# Patient Record
Sex: Female | Born: 1955 | Race: White | Hispanic: No | Smoking: Never smoker
Health system: Southern US, Community
[De-identification: ages and names within clinical notes are randomized; demographics above are authoritative.]

## PROBLEM LIST (undated history)

## (undated) DIAGNOSIS — F32A Depression, unspecified: Secondary | ICD-10-CM

## (undated) DIAGNOSIS — K589 Irritable bowel syndrome without diarrhea: Secondary | ICD-10-CM

## (undated) DIAGNOSIS — I10 Essential (primary) hypertension: Secondary | ICD-10-CM

## (undated) DIAGNOSIS — J40 Bronchitis, not specified as acute or chronic: Secondary | ICD-10-CM

## (undated) DIAGNOSIS — K219 Gastro-esophageal reflux disease without esophagitis: Secondary | ICD-10-CM

## (undated) DIAGNOSIS — G8929 Other chronic pain: Secondary | ICD-10-CM

## (undated) DIAGNOSIS — R351 Nocturia: Secondary | ICD-10-CM

## (undated) DIAGNOSIS — R112 Nausea with vomiting, unspecified: Secondary | ICD-10-CM

## (undated) DIAGNOSIS — Z9889 Other specified postprocedural states: Secondary | ICD-10-CM

## (undated) DIAGNOSIS — U071 COVID-19: Secondary | ICD-10-CM

## (undated) DIAGNOSIS — J45909 Unspecified asthma, uncomplicated: Secondary | ICD-10-CM

## (undated) DIAGNOSIS — M549 Dorsalgia, unspecified: Secondary | ICD-10-CM

## (undated) DIAGNOSIS — E039 Hypothyroidism, unspecified: Secondary | ICD-10-CM

## (undated) DIAGNOSIS — M1990 Unspecified osteoarthritis, unspecified site: Secondary | ICD-10-CM

## (undated) DIAGNOSIS — Z8719 Personal history of other diseases of the digestive system: Secondary | ICD-10-CM

## (undated) DIAGNOSIS — J189 Pneumonia, unspecified organism: Secondary | ICD-10-CM

## (undated) DIAGNOSIS — Z860100 Personal history of colon polyps, unspecified: Secondary | ICD-10-CM

## (undated) DIAGNOSIS — F419 Anxiety disorder, unspecified: Secondary | ICD-10-CM

## (undated) DIAGNOSIS — F329 Major depressive disorder, single episode, unspecified: Secondary | ICD-10-CM

## (undated) HISTORY — PX: CHOLECYSTECTOMY: SHX55

## (undated) HISTORY — DX: Bronchitis, not specified as acute or chronic: J40

## (undated) HISTORY — PX: OTHER SURGICAL HISTORY: SHX169

## (undated) HISTORY — DX: COVID-19: U07.1

## (undated) HISTORY — PX: ESOPHAGOGASTRODUODENOSCOPY: SHX1529

## (undated) HISTORY — PX: COLONOSCOPY: SHX174

## (undated) HISTORY — PX: BREAST BIOPSY: SHX20

## (undated) HISTORY — PX: ELBOW SURGERY: SHX618

## (undated) HISTORY — PX: DILATION AND CURETTAGE OF UTERUS: SHX78

## (undated) HISTORY — DX: Unspecified asthma, uncomplicated: J45.909

## (undated) HISTORY — PX: BREAST SURGERY: SHX581

---

## 2007-10-01 HISTORY — PX: COLONOSCOPY: SHX174

## 2008-06-20 ENCOUNTER — Ambulatory Visit: Payer: Self-pay | Admitting: Cardiology

## 2009-09-15 ENCOUNTER — Emergency Department (HOSPITAL_COMMUNITY): Admission: EM | Admit: 2009-09-15 | Discharge: 2009-09-15 | Payer: Self-pay | Admitting: Emergency Medicine

## 2011-06-12 ENCOUNTER — Other Ambulatory Visit: Payer: Self-pay | Admitting: Neurosurgery

## 2011-06-12 DIAGNOSIS — M47816 Spondylosis without myelopathy or radiculopathy, lumbar region: Secondary | ICD-10-CM

## 2011-06-21 ENCOUNTER — Ambulatory Visit
Admission: RE | Admit: 2011-06-21 | Discharge: 2011-06-21 | Disposition: A | Discharge status: Home / Self Care | Payer: BC Managed Care – PPO | Source: Ambulatory Visit | Attending: Neurosurgery | Admitting: Neurosurgery

## 2011-06-21 ENCOUNTER — Other Ambulatory Visit: Payer: Self-pay | Admitting: Neurosurgery

## 2011-06-21 DIAGNOSIS — M47816 Spondylosis without myelopathy or radiculopathy, lumbar region: Secondary | ICD-10-CM

## 2011-06-21 MED ORDER — IOHEXOL 180 MG/ML  SOLN
1.0000 mL | Freq: Once | INTRAMUSCULAR | Status: AC | PRN
  Administered 2011-06-21: 1 mL via INTRA_ARTICULAR

## 2011-06-21 MED ORDER — METHYLPREDNISOLONE ACETATE 40 MG/ML INJ SUSP (RADIOLOG
120.0000 mg | Freq: Once | INTRAMUSCULAR | Status: AC
  Administered 2011-06-21: 120 mg via INTRA_ARTICULAR

## 2011-07-16 ENCOUNTER — Other Ambulatory Visit: Payer: Self-pay | Admitting: Neurosurgery

## 2011-07-16 DIAGNOSIS — M47816 Spondylosis without myelopathy or radiculopathy, lumbar region: Secondary | ICD-10-CM

## 2011-08-27 ENCOUNTER — Other Ambulatory Visit: Payer: Self-pay | Admitting: Neurosurgery

## 2011-08-27 DIAGNOSIS — M79651 Pain in right thigh: Secondary | ICD-10-CM

## 2011-08-27 DIAGNOSIS — M549 Dorsalgia, unspecified: Secondary | ICD-10-CM

## 2011-09-05 ENCOUNTER — Ambulatory Visit
Admission: RE | Admit: 2011-09-05 | Discharge: 2011-09-05 | Disposition: A | Discharge status: Home / Self Care | Payer: BC Managed Care – PPO | Source: Ambulatory Visit | Attending: Neurosurgery | Admitting: Neurosurgery

## 2011-09-05 DIAGNOSIS — M79651 Pain in right thigh: Secondary | ICD-10-CM

## 2011-09-05 DIAGNOSIS — M549 Dorsalgia, unspecified: Secondary | ICD-10-CM

## 2011-09-05 MED ORDER — DIAZEPAM 5 MG PO TABS
10.0000 mg | ORAL_TABLET | Freq: Once | ORAL | Status: AC
  Administered 2011-09-05: 10 mg via ORAL

## 2011-09-05 MED ORDER — IOHEXOL 180 MG/ML  SOLN
15.0000 mL | Freq: Once | INTRAMUSCULAR | Status: AC | PRN
  Administered 2011-09-05: 15 mL via INTRATHECAL

## 2011-09-05 NOTE — Progress Notes (Addendum)
Explained discharge instructions to pt and husband, consent signed and valium given. Pt has been of citalopram and tramadol for the last 2 days.

## 2011-09-05 NOTE — Patient Instructions (Signed)
Myelogram  Discharge Instructions  1. Go home and rest quietly for the next 24 hours.  It is important to lie flat for the next 24 hours.  Get up only to go to the restroom.  You may lie in the bed or on a couch on your back, your stomach, your left side or your right side.  You may have one pillow under your head.  You may have pillows between your knees while you are on your side or under your knees while you are on your back.  2. DO NOT drive today.  Recline the seat as far back as it will go, while still wearing your seat belt, on the way home.  3. You may get up to go to the bathroom as needed.  You may sit up for 10 minutes to eat.  You may resume your normal diet and medications unless otherwise indicated.  4. The incidence of headache, nausea, or vomiting is about 5% (one in 20 patients).  If you develop a headache, lie flat and drink plenty of fluids until the headache goes away.  Caffeinated beverages may be helpful.  If you develop severe nausea and vomiting or a headache that does not go away with flat bed rest, call [phone].  5. You may resume normal activities after your 24 hours of bed rest is over; however, do not exert yourself strongly or do any heavy lifting tomorrow.  6. Call your physician for a follow-up appointment.  The results of your myelogram will be sent directly to your physician by the following day.  7. If you have any questions or if complications develop after you arrive home, please call [phone].  Discharge instructions have been explained to the patient.  The patient, or the person responsible for the patient, fully understands these instructions.   Resume citalopram and tramadol on 09/06/11, after 1:00 pm.

## 2011-10-25 ENCOUNTER — Other Ambulatory Visit: Payer: Self-pay | Admitting: Neurosurgery

## 2011-11-13 ENCOUNTER — Ambulatory Visit (HOSPITAL_COMMUNITY)
Admission: RE | Admit: 2011-11-13 | Discharge: 2011-11-13 | Disposition: A | Discharge status: Home / Self Care | Payer: BC Managed Care – PPO | Source: Ambulatory Visit | Attending: Anesthesiology | Admitting: Anesthesiology

## 2011-11-13 ENCOUNTER — Encounter (HOSPITAL_COMMUNITY): Payer: Self-pay | Admitting: Pharmacy Technician

## 2011-11-13 ENCOUNTER — Encounter (HOSPITAL_COMMUNITY): Payer: Self-pay

## 2011-11-13 ENCOUNTER — Other Ambulatory Visit: Payer: Self-pay

## 2011-11-13 ENCOUNTER — Encounter (HOSPITAL_COMMUNITY)
Admission: RE | Admit: 2011-11-13 | Discharge: 2011-11-13 | Disposition: A | Discharge status: Home / Self Care | Payer: BC Managed Care – PPO | Source: Ambulatory Visit | Attending: Neurosurgery | Admitting: Neurosurgery

## 2011-11-13 DIAGNOSIS — Z01818 Encounter for other preprocedural examination: Secondary | ICD-10-CM | POA: Insufficient documentation

## 2011-11-13 DIAGNOSIS — Z01810 Encounter for preprocedural cardiovascular examination: Secondary | ICD-10-CM | POA: Insufficient documentation

## 2011-11-13 DIAGNOSIS — Z01812 Encounter for preprocedural laboratory examination: Secondary | ICD-10-CM | POA: Insufficient documentation

## 2011-11-13 HISTORY — DX: Personal history of colonic polyps: Z86.010

## 2011-11-13 HISTORY — DX: Other chronic pain: G89.29

## 2011-11-13 HISTORY — DX: Gastro-esophageal reflux disease without esophagitis: K21.9

## 2011-11-13 HISTORY — DX: Essential (primary) hypertension: I10

## 2011-11-13 HISTORY — DX: Nocturia: R35.1

## 2011-11-13 HISTORY — DX: Hypothyroidism, unspecified: E03.9

## 2011-11-13 HISTORY — DX: Irritable bowel syndrome, unspecified: K58.9

## 2011-11-13 HISTORY — DX: Dorsalgia, unspecified: M54.9

## 2011-11-13 HISTORY — DX: Anxiety disorder, unspecified: F41.9

## 2011-11-13 HISTORY — DX: Personal history of colon polyps, unspecified: Z86.0100

## 2011-11-13 HISTORY — DX: Other specified postprocedural states: Z98.890

## 2011-11-13 HISTORY — DX: Unspecified osteoarthritis, unspecified site: M19.90

## 2011-11-13 HISTORY — DX: Major depressive disorder, single episode, unspecified: F32.9

## 2011-11-13 HISTORY — DX: Other specified postprocedural states: R11.2

## 2011-11-13 HISTORY — DX: Pneumonia, unspecified organism: J18.9

## 2011-11-13 HISTORY — DX: Personal history of other diseases of the digestive system: Z87.19

## 2011-11-13 HISTORY — DX: Depression, unspecified: F32.A

## 2011-11-13 LAB — BASIC METABOLIC PANEL
BUN: 9 mg/dL (ref 6–23)
CO2: 31 mEq/L (ref 19–32)
Calcium: 9.4 mg/dL (ref 8.4–10.5)
Chloride: 103 mEq/L (ref 96–112)
Creatinine, Ser: 0.83 mg/dL (ref 0.50–1.10)
GFR calc Af Amer: 90 mL/min — ABNORMAL LOW (ref >90)
GFR calc non Af Amer: 77 mL/min — ABNORMAL LOW (ref >90)
Glucose, Bld: 94 mg/dL (ref 70–99)
Potassium: 3.4 mEq/L — ABNORMAL LOW (ref 3.5–5.1)
Sodium: 141 mEq/L (ref 135–145)

## 2011-11-13 LAB — CBC
HCT: 35.6 % — ABNORMAL LOW (ref 36.0–46.0)
Hemoglobin: 11.8 g/dL — ABNORMAL LOW (ref 12.0–15.0)
MCH: 29.9 pg (ref 26.0–34.0)
MCHC: 33.1 g/dL (ref 30.0–36.0)
MCV: 90.1 fL (ref 78.0–100.0)
Platelets: 206 10*3/uL (ref 150–400)
RBC: 3.95 MIL/uL (ref 3.87–5.11)
RDW: 13.6 % (ref 11.5–15.5)
WBC: 5.5 10*3/uL (ref 4.0–10.5)

## 2011-11-13 LAB — SURGICAL PCR SCREEN
MRSA, PCR: NEGATIVE
Staphylococcus aureus: NEGATIVE

## 2011-11-13 NOTE — Progress Notes (Signed)
Pt doesn't have a cardiologist;pt is maintained by PA Roma Kayser  Stress test done about 57yrs ago at [facility]-to request report  Never had an echo or heart cath

## 2011-11-13 NOTE — Pre-Procedure Instructions (Signed)
20 Sherry Wong  11/13/2011   Your procedure is scheduled on:  Wed, Feb 20 @ 1205  Report to [facility] at 0900 AM.  Call this number if you have problems the morning of surgery: [phone]   Remember:   Do not eat food:After Midnight.  May have clear liquids: up to 4 Hours before arrival.(until 5:00 am)  Clear liquids include soda, tea, black coffee, apple or grape juice, broth.  Take these medicines the morning of surgery with A SIP OF WATER:    Do not wear jewelry, make-up or nail polish.  Do not wear lotions, powders, or perfumes. You may wear deodorant.  Do not shave 48 hours prior to surgery.  Do not bring valuables to the hospital.  Contacts, dentures or bridgework may not be worn into surgery.  Leave suitcase in the car. After surgery it may be brought to your room.  For patients admitted to the hospital, checkout time is 11:00 AM the day of discharge.   Patients discharged the day of surgery will not be allowed to drive home.  Name and phone number of your driver:    Special Instructions: CHG Shower Use Special Wash: 1/2 bottle night before surgery and 1/2 bottle morning of surgery.   Please read over the following fact sheets that you were given: Pain Booklet, Coughing and Deep Breathing, MRSA Information and Surgical Site Infection Prevention

## 2011-11-14 NOTE — Progress Notes (Signed)
nuc stress test placed on this chart.

## 2011-11-19 MED ORDER — CEFAZOLIN SODIUM-DEXTROSE 2-3 GM-% IV SOLR: 2.0000 g | INTRAVENOUS | Status: DC

## 2011-11-20 ENCOUNTER — Encounter (HOSPITAL_COMMUNITY): Admission: RE | Payer: Self-pay | Source: Ambulatory Visit

## 2011-11-20 ENCOUNTER — Inpatient Hospital Stay (HOSPITAL_COMMUNITY): Admission: RE | Admit: 2011-11-20 | Payer: BC Managed Care – PPO | Source: Ambulatory Visit | Admitting: Neurosurgery

## 2011-11-20 SURGERY — LUMBAR LAMINECTOMY/DECOMPRESSION MICRODISCECTOMY
Anesthesia: General | Laterality: Right

## 2011-11-22 ENCOUNTER — Other Ambulatory Visit: Payer: Self-pay | Admitting: Neurosurgery

## 2011-12-02 MED ORDER — CEFAZOLIN SODIUM-DEXTROSE 2-3 GM-% IV SOLR
2.0000 g | INTRAVENOUS | Status: AC
  Administered 2011-12-03: 2 g via INTRAVENOUS
  Filled 2011-12-02: qty 50

## 2011-12-03 ENCOUNTER — Encounter (HOSPITAL_COMMUNITY): Payer: Self-pay | Admitting: Anesthesiology

## 2011-12-03 ENCOUNTER — Ambulatory Visit (HOSPITAL_COMMUNITY): Payer: BC Managed Care – PPO

## 2011-12-03 ENCOUNTER — Encounter (HOSPITAL_COMMUNITY)
Admission: RE | Disposition: A | Discharge status: Home / Self Care | Payer: Self-pay | Source: Ambulatory Visit | Attending: Neurosurgery

## 2011-12-03 ENCOUNTER — Ambulatory Visit (HOSPITAL_COMMUNITY): Payer: BC Managed Care – PPO | Admitting: Anesthesiology

## 2011-12-03 ENCOUNTER — Encounter (HOSPITAL_COMMUNITY): Payer: Self-pay | Admitting: Surgery

## 2011-12-03 ENCOUNTER — Inpatient Hospital Stay (HOSPITAL_COMMUNITY)
Admission: RE | Admit: 2011-12-03 | Discharge: 2011-12-03 | DRG: 758 | Disposition: A | Discharge status: Home / Self Care | Payer: BC Managed Care – PPO | Source: Ambulatory Visit | Attending: Neurosurgery | Admitting: Neurosurgery

## 2011-12-03 DIAGNOSIS — M109 Gout, unspecified: Secondary | ICD-10-CM | POA: Diagnosis present

## 2011-12-03 DIAGNOSIS — Z8701 Personal history of pneumonia (recurrent): Secondary | ICD-10-CM

## 2011-12-03 DIAGNOSIS — I10 Essential (primary) hypertension: Secondary | ICD-10-CM | POA: Diagnosis present

## 2011-12-03 DIAGNOSIS — M48061 Spinal stenosis, lumbar region without neurogenic claudication: Principal | ICD-10-CM | POA: Diagnosis present

## 2011-12-03 DIAGNOSIS — Z886 Allergy status to analgesic agent status: Secondary | ICD-10-CM

## 2011-12-03 DIAGNOSIS — F411 Generalized anxiety disorder: Secondary | ICD-10-CM | POA: Diagnosis present

## 2011-12-03 DIAGNOSIS — K219 Gastro-esophageal reflux disease without esophagitis: Secondary | ICD-10-CM | POA: Diagnosis present

## 2011-12-03 DIAGNOSIS — F329 Major depressive disorder, single episode, unspecified: Secondary | ICD-10-CM | POA: Diagnosis present

## 2011-12-03 DIAGNOSIS — F3289 Other specified depressive episodes: Secondary | ICD-10-CM | POA: Diagnosis present

## 2011-12-03 DIAGNOSIS — E039 Hypothyroidism, unspecified: Secondary | ICD-10-CM | POA: Diagnosis present

## 2011-12-03 HISTORY — PX: LUMBAR LAMINECTOMY/DECOMPRESSION MICRODISCECTOMY: SHX5026

## 2011-12-03 LAB — BASIC METABOLIC PANEL
BUN: 10 mg/dL (ref 6–23)
CO2: 30 mEq/L (ref 19–32)
Calcium: 9.8 mg/dL (ref 8.4–10.5)
Chloride: 102 mEq/L (ref 96–112)
Creatinine, Ser: 0.81 mg/dL (ref 0.50–1.10)
GFR calc Af Amer: >90 mL/min (ref >90)
GFR calc non Af Amer: 80 mL/min — ABNORMAL LOW (ref >90)
Glucose, Bld: 95 mg/dL (ref 70–99)
Potassium: 3.8 mEq/L (ref 3.5–5.1)
Sodium: 140 mEq/L (ref 135–145)

## 2011-12-03 LAB — CBC
HCT: 40.2 % (ref 36.0–46.0)
Hemoglobin: 13.5 g/dL (ref 12.0–15.0)
MCH: 30.5 pg (ref 26.0–34.0)
MCHC: 33.6 g/dL (ref 30.0–36.0)
MCV: 90.7 fL (ref 78.0–100.0)
Platelets: 221 10*3/uL (ref 150–400)
RBC: 4.43 MIL/uL (ref 3.87–5.11)
RDW: 14.3 % (ref 11.5–15.5)
WBC: 6.9 10*3/uL (ref 4.0–10.5)

## 2011-12-03 SURGERY — LUMBAR LAMINECTOMY/DECOMPRESSION MICRODISCECTOMY 2 LEVELS
Anesthesia: General | Laterality: Right | Wound class: Clean

## 2011-12-03 MED ORDER — FENTANYL CITRATE 0.05 MG/ML IJ SOLN
INTRAMUSCULAR | Status: DC | PRN
  Administered 2011-12-03: 100 ug via INTRAVENOUS
  Administered 2011-12-03: 150 ug via INTRAVENOUS

## 2011-12-03 MED ORDER — LOSARTAN POTASSIUM-HCTZ 100-25 MG PO TABS: 1.0000 | ORAL_TABLET | Freq: Every day | ORAL | Status: DC

## 2011-12-03 MED ORDER — MORPHINE SULFATE 4 MG/ML IJ SOLN
4.0000 mg | INTRAMUSCULAR | Status: DC | PRN
  Administered 2011-12-03: 4 mg via INTRAVENOUS
  Filled 2011-12-03: qty 1

## 2011-12-03 MED ORDER — ACETAMINOPHEN 650 MG RE SUPP: 650.0000 mg | RECTAL | Status: DC | PRN

## 2011-12-03 MED ORDER — LOSARTAN POTASSIUM 50 MG PO TABS
100.0000 mg | ORAL_TABLET | Freq: Every day | ORAL | Status: DC
Start: 2011-12-03 — End: 2011-12-03
  Administered 2011-12-03: 100 mg via ORAL
  Filled 2011-12-03: qty 2

## 2011-12-03 MED ORDER — CEFAZOLIN SODIUM 1-5 GM-% IV SOLN
1.0000 g | Freq: Three times a day (TID) | INTRAVENOUS | Status: DC
  Administered 2011-12-03: 1 g via INTRAVENOUS
  Filled 2011-12-03 (×2): qty 50

## 2011-12-03 MED ORDER — GLYCOPYRROLATE 0.2 MG/ML IJ SOLN
INTRAMUSCULAR | Status: DC | PRN
  Administered 2011-12-03: .7 mg via INTRAVENOUS

## 2011-12-03 MED ORDER — BUPIVACAINE LIPOSOME 1.3 % IJ SUSP
20.0000 mL | INTRAMUSCULAR | Status: DC
  Filled 2011-12-03: qty 20

## 2011-12-03 MED ORDER — BUPIVACAINE LIPOSOME 1.3 % IJ SUSP
INTRAMUSCULAR | Status: DC | PRN
  Administered 2011-12-03: 20 mL

## 2011-12-03 MED ORDER — PHENOL 1.4 % MT LIQD: 1.0000 | OROMUCOSAL | Status: DC | PRN

## 2011-12-03 MED ORDER — ACETAMINOPHEN 325 MG PO TABS: 650.0000 mg | ORAL_TABLET | ORAL | Status: DC | PRN

## 2011-12-03 MED ORDER — DOCUSATE SODIUM 100 MG PO CAPS: 100.0000 mg | ORAL_CAPSULE | Freq: Two times a day (BID) | ORAL | Status: DC

## 2011-12-03 MED ORDER — SODIUM CHLORIDE 0.9 % IJ SOLN: 3.0000 mL | Freq: Two times a day (BID) | INTRAMUSCULAR | Status: DC

## 2011-12-03 MED ORDER — HYDROCHLOROTHIAZIDE 25 MG PO TABS
25.0000 mg | ORAL_TABLET | Freq: Every day | ORAL | Status: DC
Start: 2011-12-03 — End: 2011-12-03
  Administered 2011-12-03: 25 mg via ORAL
  Filled 2011-12-03: qty 1

## 2011-12-03 MED ORDER — THROMBIN 5000 UNITS EX SOLR
CUTANEOUS | Status: DC | PRN
  Administered 2011-12-03 (×2): 5000 [IU] via TOPICAL

## 2011-12-03 MED ORDER — CIPROFLOXACIN HCL 500 MG PO TABS
500.0000 mg | ORAL_TABLET | Freq: Two times a day (BID) | ORAL | Status: DC
  Administered 2011-12-03: 500 mg via ORAL
  Filled 2011-12-03 (×2): qty 1

## 2011-12-03 MED ORDER — NEOSTIGMINE METHYLSULFATE 1 MG/ML IJ SOLN
INTRAMUSCULAR | Status: DC | PRN
  Administered 2011-12-03: 4 mg via INTRAVENOUS

## 2011-12-03 MED ORDER — ONDANSETRON HCL 4 MG/2ML IJ SOLN
INTRAMUSCULAR | Status: DC | PRN
  Administered 2011-12-03: 4 mg via INTRAVENOUS

## 2011-12-03 MED ORDER — MIDAZOLAM HCL 5 MG/5ML IJ SOLN
INTRAMUSCULAR | Status: DC | PRN
  Administered 2011-12-03: 2 mg via INTRAVENOUS

## 2011-12-03 MED ORDER — METRONIDAZOLE 500 MG PO TABS
500.0000 mg | ORAL_TABLET | Freq: Four times a day (QID) | ORAL | Status: DC
  Filled 2011-12-03 (×3): qty 1

## 2011-12-03 MED ORDER — 0.9 % SODIUM CHLORIDE (POUR BTL) OPTIME
TOPICAL | Status: DC | PRN
  Administered 2011-12-03: 1000 mL

## 2011-12-03 MED ORDER — VECURONIUM BROMIDE 10 MG IV SOLR
INTRAVENOUS | Status: DC | PRN
  Administered 2011-12-03: 6 mg via INTRAVENOUS

## 2011-12-03 MED ORDER — OXYCODONE-ACETAMINOPHEN 5-325 MG PO TABS
1.0000 | ORAL_TABLET | ORAL | Status: DC | PRN
  Administered 2011-12-03: 1 via ORAL
  Filled 2011-12-03: qty 1

## 2011-12-03 MED ORDER — FENTANYL CITRATE 0.05 MG/ML IJ SOLN
INTRAMUSCULAR | Status: AC
  Filled 2011-12-03: qty 2

## 2011-12-03 MED ORDER — SODIUM CHLORIDE 0.9 % IV SOLN: INTRAVENOUS | Status: DC

## 2011-12-03 MED ORDER — DIAZEPAM 5 MG PO TABS
5.0000 mg | ORAL_TABLET | Freq: Four times a day (QID) | ORAL | Status: DC | PRN
  Administered 2011-12-03: 5 mg via ORAL
  Filled 2011-12-03: qty 1

## 2011-12-03 MED ORDER — LIDOCAINE HCL (CARDIAC) 20 MG/ML IV SOLN
INTRAVENOUS | Status: DC | PRN
  Administered 2011-12-03: 80 mg via INTRAVENOUS

## 2011-12-03 MED ORDER — SODIUM CHLORIDE 0.9 % IJ SOLN: 3.0000 mL | INTRAMUSCULAR | Status: DC | PRN

## 2011-12-03 MED ORDER — HEMOSTATIC AGENTS (NO CHARGE) OPTIME
TOPICAL | Status: DC | PRN
  Administered 2011-12-03: 1 via TOPICAL

## 2011-12-03 MED ORDER — PANTOPRAZOLE SODIUM 40 MG PO TBEC: 40.0000 mg | DELAYED_RELEASE_TABLET | Freq: Every day | ORAL | Status: DC

## 2011-12-03 MED ORDER — BUPIVACAINE-EPINEPHRINE PF 0.5-1:200000 % IJ SOLN
INTRAMUSCULAR | Status: DC | PRN
  Administered 2011-12-03: 1 mL

## 2011-12-03 MED ORDER — SUCCINYLCHOLINE CHLORIDE 20 MG/ML IJ SOLN
INTRAMUSCULAR | Status: DC | PRN
  Administered 2011-12-03: 100 mg via INTRAVENOUS

## 2011-12-03 MED ORDER — MENTHOL 3 MG MT LOZG: 1.0000 | LOZENGE | OROMUCOSAL | Status: DC | PRN

## 2011-12-03 MED ORDER — ONDANSETRON HCL 4 MG/2ML IJ SOLN: 4.0000 mg | INTRAMUSCULAR | Status: DC | PRN

## 2011-12-03 MED ORDER — HYOSCYAMINE SULFATE 0.125 MG PO TABS
0.1250 mg | ORAL_TABLET | Freq: Three times a day (TID) | ORAL | Status: DC
  Administered 2011-12-03: 0.125 mg via ORAL
  Filled 2011-12-03 (×2): qty 1

## 2011-12-03 MED ORDER — PROPOFOL 10 MG/ML IV BOLUS
INTRAVENOUS | Status: DC | PRN
  Administered 2011-12-03: 170 mg via INTRAVENOUS

## 2011-12-03 MED ORDER — FENTANYL CITRATE 0.05 MG/ML IJ SOLN
25.0000 ug | INTRAMUSCULAR | Status: DC | PRN
  Administered 2011-12-03: 25 ug via INTRAVENOUS

## 2011-12-03 MED ORDER — METHYLPREDNISOLONE ACETATE 80 MG/ML IJ SUSP
INTRAMUSCULAR | Status: DC | PRN
  Administered 2011-12-03: 80 mg

## 2011-12-03 MED ORDER — LACTATED RINGERS IV SOLN
INTRAVENOUS | Status: DC | PRN
  Administered 2011-12-03 (×2): via INTRAVENOUS

## 2011-12-03 MED ORDER — DEXAMETHASONE SODIUM PHOSPHATE 4 MG/ML IJ SOLN
INTRAMUSCULAR | Status: DC | PRN
  Administered 2011-12-03: 8 mg via INTRAVENOUS

## 2011-12-03 MED ORDER — CITALOPRAM HYDROBROMIDE 20 MG PO TABS: 20.0000 mg | ORAL_TABLET | Freq: Every day | ORAL | Status: DC

## 2011-12-03 MED ORDER — LEVOTHYROXINE SODIUM 150 MCG PO TABS
150.0000 ug | ORAL_TABLET | Freq: Every day | ORAL | Status: DC
  Filled 2011-12-03: qty 1

## 2011-12-03 SURGICAL SUPPLY — 54 items
BENZOIN TINCTURE PRP APPL 2/3 (GAUZE/BANDAGES/DRESSINGS) ×2 IMPLANT
BLADE SURG ROTATE 9660 (MISCELLANEOUS) IMPLANT
BUR ACORN 6.0 (BURR) ×2 IMPLANT
BUR MATCHSTICK NEURO 3.0 LAGG (BURR) ×2 IMPLANT
CANISTER SUCTION 2500CC (MISCELLANEOUS) ×2 IMPLANT
CLOTH BEACON ORANGE TIMEOUT ST (SAFETY) ×2 IMPLANT
CONT SPEC 4OZ CLIKSEAL STRL BL (MISCELLANEOUS) ×2 IMPLANT
DRAPE LAPAROTOMY 100X72X124 (DRAPES) ×2 IMPLANT
DRAPE MICROSCOPE LEICA (MISCELLANEOUS) ×2 IMPLANT
DRAPE POUCH INSTRU U-SHP 10X18 (DRAPES) ×2 IMPLANT
DRSG PAD ABDOMINAL 8X10 ST (GAUZE/BANDAGES/DRESSINGS) ×2 IMPLANT
DURAPREP 26ML APPLICATOR (WOUND CARE) ×2 IMPLANT
ELECT BLADE 4.0 EZ CLEAN MEGAD (MISCELLANEOUS) ×2
ELECT REM PT RETURN 9FT ADLT (ELECTROSURGICAL) ×2
ELECTRODE BLDE 4.0 EZ CLN MEGD (MISCELLANEOUS) ×1 IMPLANT
ELECTRODE REM PT RTRN 9FT ADLT (ELECTROSURGICAL) ×1 IMPLANT
GAUZE SPONGE 4X4 16PLY XRAY LF (GAUZE/BANDAGES/DRESSINGS) ×2 IMPLANT
GLOVE BIOGEL M 8.0 STRL (GLOVE) ×2 IMPLANT
GLOVE BIOGEL PI IND STRL 8 (GLOVE) ×2 IMPLANT
GLOVE BIOGEL PI INDICATOR 8 (GLOVE) ×2
GLOVE ECLIPSE 8.5 STRL (GLOVE) ×2 IMPLANT
GLOVE EXAM NITRILE LRG STRL (GLOVE) IMPLANT
GLOVE EXAM NITRILE MD LF STRL (GLOVE) IMPLANT
GLOVE EXAM NITRILE XL STR (GLOVE) IMPLANT
GLOVE EXAM NITRILE XS STR PU (GLOVE) IMPLANT
GLOVE INDICATOR 8.5 STRL (GLOVE) ×4 IMPLANT
GOWN BRE IMP SLV AUR LG STRL (GOWN DISPOSABLE) ×2 IMPLANT
GOWN BRE IMP SLV AUR XL STRL (GOWN DISPOSABLE) ×2 IMPLANT
GOWN STRL REIN 2XL LVL4 (GOWN DISPOSABLE) ×2 IMPLANT
KIT BASIN OR (CUSTOM PROCEDURE TRAY) ×2 IMPLANT
KIT ROOM TURNOVER OR (KITS) ×2 IMPLANT
NEEDLE HYPO 18GX1.5 BLUNT FILL (NEEDLE) IMPLANT
NEEDLE HYPO 21X1.5 SAFETY (NEEDLE) ×2 IMPLANT
NEEDLE HYPO 25X1 1.5 SAFETY (NEEDLE) IMPLANT
NEEDLE SPNL 20GX3.5 QUINCKE YW (NEEDLE) IMPLANT
NS IRRIG 1000ML POUR BTL (IV SOLUTION) ×2 IMPLANT
PACK LAMINECTOMY NEURO (CUSTOM PROCEDURE TRAY) ×2 IMPLANT
PAD ARMBOARD 7.5X6 YLW CONV (MISCELLANEOUS) ×6 IMPLANT
PATTIES SURGICAL .5 X1 (DISPOSABLE) ×2 IMPLANT
RUBBERBAND STERILE (MISCELLANEOUS) ×4 IMPLANT
SPONGE GAUZE 4X4 12PLY (GAUZE/BANDAGES/DRESSINGS) ×2 IMPLANT
SPONGE LAP 4X18 X RAY DECT (DISPOSABLE) IMPLANT
SPONGE SURGIFOAM ABS GEL SZ50 (HEMOSTASIS) ×2 IMPLANT
STRIP CLOSURE SKIN 1/2X4 (GAUZE/BANDAGES/DRESSINGS) ×2 IMPLANT
SUT VIC AB 0 CT1 18XCR BRD8 (SUTURE) ×1 IMPLANT
SUT VIC AB 0 CT1 8-18 (SUTURE) ×1
SUT VIC AB 2-0 CP2 18 (SUTURE) ×2 IMPLANT
SUT VIC AB 3-0 SH 8-18 (SUTURE) ×2 IMPLANT
SYR 20CC LL (SYRINGE) ×2 IMPLANT
SYR 20ML ECCENTRIC (SYRINGE) ×2 IMPLANT
SYR 5ML LL (SYRINGE) IMPLANT
TOWEL OR 17X24 6PK STRL BLUE (TOWEL DISPOSABLE) ×2 IMPLANT
TOWEL OR 17X26 10 PK STRL BLUE (TOWEL DISPOSABLE) ×2 IMPLANT
WATER STERILE IRR 1000ML POUR (IV SOLUTION) ×2 IMPLANT

## 2011-12-03 NOTE — OR Nursing (Signed)
FENTANYL 100 MCG GIVEN AT SURGICAL SITE AT CLOSE OF PROCEDURE BY DR Jeral Fruit

## 2011-12-03 NOTE — Transfer of Care (Signed)
Immediate Anesthesia Transfer of Care Note  Patient: Sherry Wong  Procedure(s) Performed: Procedure(s) (LRB): LUMBAR LAMINECTOMY/DECOMPRESSION MICRODISCECTOMY 2 LEVELS (Right)  Patient Location: PACU  Anesthesia Type: General  Level of Consciousness: awake  Airway & Oxygen Therapy: Patient Spontanous Breathing  Post-op Assessment: Report given to PACU RN  Post vital signs: stable  Complications: No apparent anesthesia complications

## 2011-12-03 NOTE — Preoperative (Signed)
Beta Blockers   Reason not to administer Beta Blockers:Not Applicable 

## 2011-12-03 NOTE — Anesthesia Preprocedure Evaluation (Addendum)
Anesthesia Evaluation  Patient identified by MRN, date of birth, ID band Patient awake    Reviewed: Allergy & Precautions, H&P , NPO status , Patient's Chart, lab work & pertinent test results  History of Anesthesia Complications (+) PONV  Airway Mallampati: II      Dental  (+) Teeth Intact   Pulmonary pneumonia ,  breath sounds clear to auscultation        Cardiovascular hypertension, Pt. on medications Rhythm:Regular Rate:Normal     Neuro/Psych Anxiety Depression    GI/Hepatic Neg liver ROS, hiatal hernia, PUD, GERD-  Poorly Controlled,  Endo/Other  Hypothyroidism   Renal/GU negative Renal ROS     Musculoskeletal   Abdominal   Peds  Hematology   Anesthesia Other Findings   Reproductive/Obstetrics                         Anesthesia Physical Anesthesia Plan  ASA: III  Anesthesia Plan: General   Post-op Pain Management:    Induction: Intravenous  Airway Management Planned: Oral ETT  Additional Equipment:   Intra-op Plan:   Post-operative Plan: Extubation in OR  Informed Consent: I have reviewed the patients History and Physical, chart, labs and discussed the procedure including the risks, benefits and alternatives for the proposed anesthesia with the patient or authorized representative who has indicated his/her understanding and acceptance.     Plan Discussed with: CRNA and Anesthesiologist  Anesthesia Plan Comments:        Anesthesia Quick Evaluation

## 2011-12-03 NOTE — Anesthesia Postprocedure Evaluation (Signed)
  Anesthesia Post-op Note  Patient: Sherry Wong  Procedure(s) Performed: Procedure(s) (LRB): LUMBAR LAMINECTOMY/DECOMPRESSION MICRODISCECTOMY 2 LEVELS (Right)  Patient Location: PACU  Anesthesia Type: General  Level of Consciousness: awake  Airway and Oxygen Therapy: Patient Spontanous Breathing  Post-op Pain: mild  Post-op Assessment: Post-op Vital signs reviewed  Post-op Vital Signs: stable  Complications: No apparent anesthesia complications

## 2011-12-03 NOTE — Discharge Summary (Signed)
Physician Discharge Summary  Patient ID: Sherry Wong MRN: [id number] DOB/AGE: Jul 08, 1956 56 y.o.  Admit date: 12/03/2011 Discharge date: 12/03/2011  Admission Diagnoses:right l45 l5s1 stenosis  Discharge Diagnoses: same Active Problems:  * No active hospital problems. *    Discharged Condition: no pain  Hospital Course:surgery this am. Wants to go home. No pain  Consults: none  Significant Diagnostic Studies: mri  Treatments: surgery  Discharge Exam: Blood pressure 98/53, pulse 71, temperature 98.2 F (36.8 C), temperature source Oral, resp. rate 20, SpO2 99.00%. no painno pain, no weakness  Disposition: Final discharge disposition not confirmed   Medication List  As of 12/03/2011  5:24 PM   ASK your doctor about these medications         ciprofloxacin 500 MG tablet   Commonly known as: CIPRO   Take 500 mg by mouth 2 (two) times daily.      citalopram 20 MG tablet   Commonly known as: CELEXA   Take 20 mg by mouth daily.      hyoscyamine 0.125 MG tablet   Commonly known as: LEVSIN, ANASPAZ   Take 0.125 mg by mouth 3 (three) times daily.      levothyroxine 150 MCG tablet   Commonly known as: SYNTHROID, LEVOTHROID   Take 150 mcg by mouth daily.      losartan-hydrochlorothiazide 100-25 MG per tablet   Commonly known as: HYZAAR   Take 1 tablet by mouth daily.      metroNIDAZOLE 500 MG tablet   Commonly known as: FLAGYL   Take 500 mg by mouth 4 (four) times daily.      oxyCODONE 15 MG immediate release tablet   Commonly known as: ROXICODONE   Take 15 mg by mouth every 6 (six) hours as needed. pain      pantoprazole 40 MG tablet   Commonly known as: PROTONIX   Take 40 mg by mouth daily.             Signed: Karn Cassis 12/03/2011, 5:24 PM

## 2011-12-03 NOTE — Progress Notes (Signed)
Right l4 5 and l5s1 foraminotomies done. Op note [phone]

## 2011-12-03 NOTE — H&P (Signed)
Sherry Wong is an 56 y.o. female.   Chief Complaint: right leg pain. patieny with a history of lbp with radiation to the right foot associated with numbness for the past ten months. Had failed with conservative treatment including facet injections. Mri lumbar showed stenosis and facet arthropathy at l45 l5s1.  Past Medical History  Diagnosis Date  . PONV (postoperative nausea and vomiting)   . Hypertension     takes Losartan daily  . Cough   . Sinus drainage   . Pneumonia     hx of 15+yrs ago  . Chronic back pain     ddd/lumbago,and radiculopathy  . Arthritis   . Gout     hx of  . GERD (gastroesophageal reflux disease)     takes Protonix daily  . H/O hiatal hernia   . Gastric ulcer   . Ulcerative colitis   . IBS (irritable bowel syndrome)   . Constipation   . Diarrhea   . Hx of colonic polyps   . Urinary incontinence   . Nocturia   . Hypothyroidism     takes Levothyroxine daily  . Depression   . Anxiety     takes celexa daily    Past Surgical History  Procedure Date  . Cysts removed from ovary   . Cholecystectomy   . Dilation and curettage of uterus   . Breast surgery     left breast lumpectomy  . Breast biopsy     right  . Colonoscopy   . Esophagogastroduodenoscopy     Family History  Problem Relation Age of Onset  . Anesthesia problems Sister   . Hypotension Neg Hx   . Malignant hyperthermia Neg Hx   . Pseudochol deficiency Neg Hx   . Anesthesia problems Mother    Social History:  does not have a smoking history on file. She does not have any smokeless tobacco history on file. She reports that she drinks alcohol. She reports that she does not use illicit drugs.  Allergies:  Allergies  Allergen Reactions  . Codeine Nausea Only    Medications Prior to Admission  Medication Dose Route Frequency Provider Last Rate Last Dose  . ceFAZolin (ANCEF) IVPB 2 g/50 mL premix  2 g Intravenous 60 min Pre-Op Karn Cassis, MD       Medications Prior to  Admission  Medication Sig Dispense Refill  . ciprofloxacin (CIPRO) 500 MG tablet Take 500 mg by mouth 2 (two) times daily.      . citalopram (CELEXA) 20 MG tablet Take 20 mg by mouth daily.      . hyoscyamine (LEVSIN, ANASPAZ) 0.125 MG tablet Take 0.125 mg by mouth 3 (three) times daily.      [name] levothyroxine (SYNTHROID, LEVOTHROID) 150 MCG tablet Take 150 mcg by mouth daily.      [name] losartan-hydrochlorothiazide (HYZAAR) 100-25 MG per tablet Take 1 tablet by mouth daily.      . metroNIDAZOLE (FLAGYL) 500 MG tablet Take 500 mg by mouth 4 (four) times daily.      [name] oxyCODONE (ROXICODONE) 15 MG immediate release tablet Take 15 mg by mouth every 6 (six) hours as needed. pain      . pantoprazole (PROTONIX) 40 MG tablet Take 40 mg by mouth daily.        Results for orders placed during the hospital encounter of 12/03/11 (from the past 48 hour(s))  BASIC METABOLIC PANEL     Status: Abnormal   Collection Time   12/03/11  6:26 AM      Component Value Range Comment   Sodium 140  135 - 145 (mEq/L)    Potassium 3.8  3.5 - 5.1 (mEq/L)    Chloride 102  96 - 112 (mEq/L)    CO2 30  19 - 32 (mEq/L)    Glucose, Bld 95  70 - 99 (mg/dL)    BUN 10  6 - 23 (mg/dL)    Creatinine, Ser 4.54  0.50 - 1.10 (mg/dL)    Calcium 9.8  8.4 - 10.5 (mg/dL)    GFR calc non Af Amer 80 (*) >90 (mL/min)    GFR calc Af Amer >90  >90 (mL/min)   CBC     Status: Normal   Collection Time   12/03/11  6:26 AM      Component Value Range Comment   WBC 6.9  4.0 - 10.5 (K/uL)    RBC 4.43  3.87 - 5.11 (MIL/uL)    Hemoglobin 13.5  12.0 - 15.0 (g/dL)    HCT 09.8  11.9 - 14.7 (%)    MCV 90.7  78.0 - 100.0 (fL)    MCH 30.5  26.0 - 34.0 (pg)    MCHC 33.6  30.0 - 36.0 (g/dL)    RDW 82.9  56.2 - 13.0 (%)    Platelets 221  150 - 400 (K/uL)    No results found.  Review of Systems  Constitutional: Negative.   HENT: Negative.   Eyes: Negative.   Respiratory: Negative.   Cardiovascular:       Hypertension  Gastrointestinal:        Gerd,hiatal hernia  Genitourinary: Negative.   Musculoskeletal: Positive for back pain.  Skin: Negative.   Neurological: Negative.   Endo/Heme/Allergies:       Hypothiroidism  Psychiatric/Behavioral: Positive for depression. The patient is nervous/anxious.     Blood pressure 109/77, pulse 68, temperature 97.4 F (36.3 C), temperature source Oral, resp. rate 18, SpO2 100.00%. Physical Exam hent,nl. Neck,nl. Cv, nl. Lungs, mild ronchii. Extremities, nl.NEURO numbness df right foot. weknness df and pf. slr positive at 60 in the right side.   Assessment/Plan Right  l4 5 and l5 s1 laminotomies and foraminotomies.. Aware of risks and outcome  Riese Hellard M 12/03/2011, 7:52 AM

## 2011-12-03 NOTE — Transfer of Care (Signed)
Immediate Anesthesia Transfer of Care Note  Patient: Sherry Wong  Procedure(s) Performed: Procedure(s) (LRB): LUMBAR LAMINECTOMY/DECOMPRESSION MICRODISCECTOMY 2 LEVELS (Right)  Patient Location: PACU  Anesthesia Type: General  Level of Consciousness: awake, alert  and oriented  Airway & Oxygen Therapy: Patient Spontanous Breathing and Patient connected to nasal cannula oxygen  Post-op Assessment: Report given to PACU RN, Post -op Vital signs reviewed and stable and Patient moving all extremities X 4  Post vital signs: Reviewed and stable  Complications: No apparent anesthesia complications

## 2011-12-03 NOTE — Anesthesia Procedure Notes (Signed)
Procedure Name: Intubation Date/Time: 12/03/2011 7:51 AM Performed by: Rosita Fire Pre-anesthesia Checklist: Emergency Drugs available, Patient identified, Suction available and Patient being monitored Patient Re-evaluated:Patient Re-evaluated prior to inductionOxygen Delivery Method: Circle system utilized Preoxygenation: Pre-oxygenation with 100% oxygen Intubation Type: IV induction Ventilation: Mask ventilation without difficulty Laryngoscope Size: Miller and 2 Grade View: Grade I Tube type: Oral Number of attempts: 1 Placement Confirmation: ETT inserted through vocal cords under direct vision,  positive ETCO2 and breath sounds checked- equal and bilateral Secured at: 21 cm Tube secured with: Tape Dental Injury: Teeth and Oropharynx as per pre-operative assessment

## 2011-12-04 ENCOUNTER — Encounter (HOSPITAL_COMMUNITY): Payer: Self-pay | Admitting: Neurosurgery

## 2011-12-04 NOTE — Op Note (Signed)
NAMEJOHAN, ANTONACCI               ACCOUNT NO.:  [id number]  MEDICAL RECORD NO.:  [id number]  LOCATION:  3524                         FACILITY:  [facility]  PHYSICIAN:  Hilda Lias, M.D.   DATE OF BIRTH:  [birth year]  DATE OF PROCEDURE:  12/03/2011 DATE OF DISCHARGE:                              OPERATIVE REPORT   PREOPERATIVE DIAGNOSIS:  Right L4-5, L5-S1 facet hypertrophy.  Stenosis, chronic L5-S1 radiculopathy.  POSTOPERATIVE DIAGNOSIS:  Right L4-5, L5-S1 facet hypertrophy. Stenosis, chronic L5-S1 radiculopathy.  PROCEDURE:  Right L5 hemilaminectomy, right L4-5, L5-S1, facetectomy, decompression of the L4, L5, and S1 nerve roots.  SURGEON:  Hilda Lias, M.D.  ASSISTANT:  Kathaleen Maser. Pool, M.D.  CLINICAL HISTORY:  Mrs. Luhmann is a 56 year old female complaining of back pain worsened to the right leg.  Although she complains of pain in the left leg, 90% of the pain is mostly in the right side.  She has also numbness and weakness.  The patient has failed with conservative treatment.  Surgery was advised.  The risks were explained to her including the possibility of no improvement, need of further surgery, which might include fusion as well as CSF leak.  DESCRIPTION OF PROCEDURE:  The patient was taken to the OR, and after intubation, she was positioned in a prone manner.  The patient was quite obese, and we were unable to feel any bony structures.  The skin was cleaned with DuraPrep.  Drapes were applied.  Midline incision was made in the midline through the skin, subcutaneous tissue, through a thick adipose tissue straight down to the lumbar spine.  X-rays showed that the tip was at the level of L3.  From then on, we dissected on the right side.  We were able to feel the disk space at the level of L4-5, L5-S1. We brought immediately the microscope into the area.  The patient had quite a bit of hypertrophic facet and we started with removal of one- third of medial facet of  L4-5 and L5-S1.  Thick calcified yellow ligament was also excised.  The hemilamina of L5 was removed.  Then, using the 1, 2, and 3-mm Kerrison punch, we did decompression of the L4, L5 and S1 nerve root.  At the end, we had plenty of space, not only for the thecal sac but all 3 nerve roots.  Valsalva maneuver was negative. Fentanyl and Depo-Medrol were left in the epidural space, and the wound was closed with Vicryl and Steri-Strips.          ______________________________ Hilda Lias, M.D.    EB/MEDQ  D:  12/03/2011  T:  12/03/2011  Job:  [id number]

## 2012-03-18 ENCOUNTER — Other Ambulatory Visit: Payer: Self-pay | Admitting: Neurosurgery

## 2012-03-18 DIAGNOSIS — M549 Dorsalgia, unspecified: Secondary | ICD-10-CM

## 2012-03-24 ENCOUNTER — Ambulatory Visit
Admission: RE | Admit: 2012-03-24 | Discharge: 2012-03-24 | Disposition: A | Discharge status: Home / Self Care | Payer: BC Managed Care – PPO | Source: Ambulatory Visit | Attending: Neurosurgery | Admitting: Neurosurgery

## 2012-03-24 VITALS — BP 98/59 | HR 59

## 2012-03-24 DIAGNOSIS — M549 Dorsalgia, unspecified: Secondary | ICD-10-CM

## 2012-03-24 MED ORDER — DIAZEPAM 5 MG PO TABS
10.0000 mg | ORAL_TABLET | Freq: Once | ORAL | Status: AC
  Administered 2012-03-24: 10 mg via ORAL

## 2012-03-24 MED ORDER — IOHEXOL 180 MG/ML  SOLN
15.0000 mL | Freq: Once | INTRAMUSCULAR | Status: AC | PRN
  Administered 2012-03-24: 15 mL via INTRATHECAL

## 2012-03-24 NOTE — Discharge Instructions (Signed)
Myelogram Discharge Instructions  1. Go home and rest quietly for the next 24 hours.  It is important to lie flat for the next 24 hours.  Get up only to go to the restroom.  You may lie in the bed or on a couch on your back, your stomach, your left side or your right side.  You may have one pillow under your head.  You may have pillows between your knees while you are on your side or under your knees while you are on your back.  2. DO NOT drive today.  Recline the seat as far back as it will go, while still wearing your seat belt, on the way home.  3. You may get up to go to the bathroom as needed.  You may sit up for 10 minutes to eat.  You may resume your normal diet and medications unless otherwise indicated.  Drink lots of extra fluids today and tomorrow.  4. The incidence of headache, nausea, or vomiting is about 5% (one in 20 patients).  If you develop a headache, lie flat and drink plenty of fluids until the headache goes away.  Caffeinated beverages may be helpful.  If you develop severe nausea and vomiting or a headache that does not go away with flat bed rest, call [phone].  5. You may resume normal activities after your 24 hours of bed rest is over; however, do not exert yourself strongly or do any heavy lifting tomorrow. If when you get up you have a headache when standing, go back to bed and force fluids for another 24 hours.  6. Call your physician for a follow-up appointment.  The results of your myelogram will be sent directly to your physician by the following day.  7. If you have any questions or if complications develop after you arrive home, please call [phone].  Discharge instructions have been explained to the patient.  The patient, or the person responsible for the patient, fully understands these instructions.       May resume celexa on March 25, 2012, after 11:00 am.

## 2012-03-24 NOTE — Progress Notes (Signed)
Pt states she has been off celexa for the past 2 days.

## 2012-08-21 ENCOUNTER — Other Ambulatory Visit: Payer: Self-pay | Admitting: Neurosurgery

## 2012-08-21 DIAGNOSIS — M549 Dorsalgia, unspecified: Secondary | ICD-10-CM

## 2012-08-21 DIAGNOSIS — M5410 Radiculopathy, site unspecified: Secondary | ICD-10-CM

## 2012-09-30 HISTORY — PX: BREAST LUMPECTOMY: SHX2

## 2013-05-06 ENCOUNTER — Other Ambulatory Visit: Payer: Self-pay | Admitting: Neurosurgery

## 2013-05-06 DIAGNOSIS — M5136 Other intervertebral disc degeneration, lumbar region: Secondary | ICD-10-CM

## 2013-05-11 ENCOUNTER — Ambulatory Visit
Admission: RE | Admit: 2013-05-11 | Discharge: 2013-05-11 | Disposition: A | Discharge status: Home / Self Care | Payer: BC Managed Care – PPO | Source: Ambulatory Visit | Attending: Neurosurgery | Admitting: Neurosurgery

## 2013-05-11 VITALS — BP 119/79 | HR 59

## 2013-05-11 DIAGNOSIS — M5136 Other intervertebral disc degeneration, lumbar region: Secondary | ICD-10-CM

## 2013-05-11 DIAGNOSIS — M51369 Other intervertebral disc degeneration, lumbar region without mention of lumbar back pain or lower extremity pain: Secondary | ICD-10-CM

## 2013-05-11 MED ORDER — OXYCODONE-ACETAMINOPHEN 5-325 MG PO TABS
1.0000 | ORAL_TABLET | Freq: Once | ORAL | Status: AC
  Administered 2013-05-11: 1 via ORAL

## 2013-05-11 MED ORDER — IOHEXOL 180 MG/ML  SOLN
15.0000 mL | Freq: Once | INTRAMUSCULAR | Status: AC | PRN
  Administered 2013-05-11: 15 mL via INTRATHECAL

## 2013-05-11 MED ORDER — DIAZEPAM 5 MG PO TABS
10.0000 mg | ORAL_TABLET | Freq: Once | ORAL | Status: AC
  Administered 2013-05-11: 10 mg via ORAL

## 2013-05-11 NOTE — Progress Notes (Signed)
Patient states she has been off Celexa for at least two days.  jkl

## 2013-06-11 ENCOUNTER — Other Ambulatory Visit: Payer: Self-pay | Admitting: Neurosurgery

## 2013-06-11 DIAGNOSIS — M5136 Other intervertebral disc degeneration, lumbar region: Secondary | ICD-10-CM

## 2013-06-16 ENCOUNTER — Ambulatory Visit
Admission: RE | Admit: 2013-06-16 | Discharge: 2013-06-16 | Disposition: A | Discharge status: Home / Self Care | Payer: BC Managed Care – PPO | Source: Ambulatory Visit | Attending: Neurosurgery | Admitting: Neurosurgery

## 2013-06-16 DIAGNOSIS — M5136 Other intervertebral disc degeneration, lumbar region: Secondary | ICD-10-CM

## 2013-06-16 MED ORDER — IOHEXOL 180 MG/ML  SOLN
1.0000 mL | Freq: Once | INTRAMUSCULAR | Status: AC | PRN
  Administered 2013-06-16: 1 mL via EPIDURAL

## 2013-06-16 MED ORDER — METHYLPREDNISOLONE ACETATE 40 MG/ML INJ SUSP (RADIOLOG
120.0000 mg | Freq: Once | INTRAMUSCULAR | Status: AC
  Administered 2013-06-16: 120 mg via EPIDURAL

## 2013-08-10 ENCOUNTER — Other Ambulatory Visit: Payer: Self-pay | Admitting: Neurosurgery

## 2013-08-12 ENCOUNTER — Encounter (HOSPITAL_COMMUNITY): Payer: Self-pay | Admitting: Pharmacy Technician

## 2013-08-17 NOTE — Pre-Procedure Instructions (Signed)
PHEONIX WISBY  08/17/2013   Your procedure is scheduled on:  November 26th, Wednesday  Report to [facility]  2 * 3 at 7:00 AM.   Call this number if you have problems the morning of surgery: [phone]   Remember:   Do not eat food or drink liquids after midnight Tuesday.    Take these medicines the morning of surgery with A SIP OF WATER: Celexa, Norco, Levothyroxine, Pantoprazole   Do not wear jewelry, make-up or nail polish.  Do not wear lotions, powders, or perfumes. You may wear deodorant.  Do not shave underarms & legs 48 hours prior to surgery.    Do not bring valuables to the hospital.  [facility] is not responsible for any belongings or valuables.               Contacts, dentures or bridgework may not be worn into surgery.  Leave suitcase in the car. After surgery it may be brought to your room.  For patients admitted to the hospital, discharge time is determined by your treatment team.               Name and phone number of your driver:    Special Instructions: Shower using CHG 2 nights before surgery and the night before surgery.  If you shower the day of surgery use CHG.  Use special wash - you have one bottle of CHG for all showers.  You should use approximately 1/3 of the bottle for each shower.   Please read over the following fact sheets that you were given: Pain Booklet, Blood Transfusion Information, MRSA Information and Surgical Site Infection Prevention

## 2013-08-18 ENCOUNTER — Encounter (HOSPITAL_COMMUNITY)
Admission: RE | Admit: 2013-08-18 | Discharge: 2013-08-18 | Disposition: A | Discharge status: Home / Self Care | Payer: BC Managed Care – PPO | Source: Ambulatory Visit | Attending: Neurosurgery | Admitting: Neurosurgery

## 2013-08-18 ENCOUNTER — Encounter (HOSPITAL_COMMUNITY): Payer: Self-pay

## 2013-08-18 DIAGNOSIS — Z01810 Encounter for preprocedural cardiovascular examination: Secondary | ICD-10-CM | POA: Insufficient documentation

## 2013-08-18 DIAGNOSIS — Z01818 Encounter for other preprocedural examination: Secondary | ICD-10-CM | POA: Insufficient documentation

## 2013-08-18 DIAGNOSIS — Z01812 Encounter for preprocedural laboratory examination: Secondary | ICD-10-CM | POA: Insufficient documentation

## 2013-08-18 LAB — BASIC METABOLIC PANEL
BUN: 8 mg/dL (ref 6–23)
CO2: 28 mEq/L (ref 19–32)
Calcium: 9.4 mg/dL (ref 8.4–10.5)
Chloride: 100 mEq/L (ref 96–112)
Creatinine, Ser: 0.84 mg/dL (ref 0.50–1.10)
GFR calc Af Amer: 87 mL/min — ABNORMAL LOW (ref >90)
GFR calc non Af Amer: 75 mL/min — ABNORMAL LOW (ref >90)
Glucose, Bld: 91 mg/dL (ref 70–99)
Potassium: 3.4 mEq/L — ABNORMAL LOW (ref 3.5–5.1)
Sodium: 140 mEq/L (ref 135–145)

## 2013-08-18 LAB — CBC
HCT: 37.7 % (ref 36.0–46.0)
Hemoglobin: 12.8 g/dL (ref 12.0–15.0)
MCH: 30.5 pg (ref 26.0–34.0)
MCHC: 34 g/dL (ref 30.0–36.0)
MCV: 90 fL (ref 78.0–100.0)
Platelets: 240 10*3/uL (ref 150–400)
RBC: 4.19 MIL/uL (ref 3.87–5.11)
RDW: 13.8 % (ref 11.5–15.5)
WBC: 5 10*3/uL (ref 4.0–10.5)

## 2013-08-18 LAB — ABO/RH: ABO/RH(D): A POS

## 2013-08-18 LAB — SURGICAL PCR SCREEN
MRSA, PCR: NEGATIVE
Staphylococcus aureus: NEGATIVE

## 2013-08-24 MED ORDER — CEFAZOLIN SODIUM-DEXTROSE 2-3 GM-% IV SOLR
2.0000 g | INTRAVENOUS | Status: AC
  Administered 2013-08-25 (×2): 2 g via INTRAVENOUS
  Filled 2013-08-24: qty 50

## 2013-08-24 NOTE — Progress Notes (Signed)
Patient returned call and Nurse instructed patient to arrive at short stay at 0755 tomorrow morning. Patient verbalized understanding.

## 2013-08-24 NOTE — Progress Notes (Signed)
Surgery moved from 1000 to 1055. Pt called with voice mail left for pt to arrive at 0800 in the morning instead of 0700, contact # for Short Stay Surgical also left for questions.

## 2013-08-25 ENCOUNTER — Inpatient Hospital Stay (HOSPITAL_COMMUNITY): Payer: BC Managed Care – PPO

## 2013-08-25 ENCOUNTER — Encounter (HOSPITAL_COMMUNITY): Payer: Self-pay | Admitting: *Deleted

## 2013-08-25 ENCOUNTER — Encounter (HOSPITAL_COMMUNITY): Payer: BC Managed Care – PPO | Admitting: Anesthesiology

## 2013-08-25 ENCOUNTER — Inpatient Hospital Stay (HOSPITAL_COMMUNITY)
Admission: RE | Admit: 2013-08-25 | Discharge: 2013-09-08 | DRG: 460 | Disposition: A | Discharge status: Rehabilitation Facility | Payer: BC Managed Care – PPO | Source: Ambulatory Visit | Attending: Neurosurgery | Admitting: Neurosurgery

## 2013-08-25 ENCOUNTER — Inpatient Hospital Stay (HOSPITAL_COMMUNITY): Payer: BC Managed Care – PPO | Admitting: Anesthesiology

## 2013-08-25 ENCOUNTER — Encounter (HOSPITAL_COMMUNITY)
Admission: RE | Disposition: A | Discharge status: Rehabilitation Facility | Payer: BC Managed Care – PPO | Source: Ambulatory Visit | Attending: Neurosurgery

## 2013-08-25 ENCOUNTER — Encounter (HOSPITAL_COMMUNITY)
Admission: RE | Disposition: A | Discharge status: Rehabilitation Facility | Payer: Self-pay | Source: Ambulatory Visit | Attending: Neurosurgery

## 2013-08-25 DIAGNOSIS — Z981 Arthrodesis status: Secondary | ICD-10-CM

## 2013-08-25 DIAGNOSIS — Z6837 Body mass index (BMI) 37.0-37.9, adult: Secondary | ICD-10-CM

## 2013-08-25 DIAGNOSIS — M5136 Other intervertebral disc degeneration, lumbar region: Secondary | ICD-10-CM | POA: Diagnosis present

## 2013-08-25 DIAGNOSIS — R34 Anuria and oliguria: Secondary | ICD-10-CM | POA: Diagnosis not present

## 2013-08-25 DIAGNOSIS — E039 Hypothyroidism, unspecified: Secondary | ICD-10-CM | POA: Diagnosis present

## 2013-08-25 DIAGNOSIS — K589 Irritable bowel syndrome without diarrhea: Secondary | ICD-10-CM | POA: Diagnosis present

## 2013-08-25 DIAGNOSIS — G834 Cauda equina syndrome: Secondary | ICD-10-CM | POA: Diagnosis not present

## 2013-08-25 DIAGNOSIS — M1990 Unspecified osteoarthritis, unspecified site: Secondary | ICD-10-CM | POA: Diagnosis present

## 2013-08-25 DIAGNOSIS — G4733 Obstructive sleep apnea (adult) (pediatric): Secondary | ICD-10-CM | POA: Diagnosis present

## 2013-08-25 DIAGNOSIS — IMO0002 Reserved for concepts with insufficient information to code with codable children: Secondary | ICD-10-CM | POA: Diagnosis not present

## 2013-08-25 DIAGNOSIS — M5137 Other intervertebral disc degeneration, lumbosacral region: Principal | ICD-10-CM | POA: Diagnosis present

## 2013-08-25 DIAGNOSIS — F411 Generalized anxiety disorder: Secondary | ICD-10-CM | POA: Diagnosis present

## 2013-08-25 DIAGNOSIS — I10 Essential (primary) hypertension: Secondary | ICD-10-CM | POA: Diagnosis present

## 2013-08-25 DIAGNOSIS — M51379 Other intervertebral disc degeneration, lumbosacral region without mention of lumbar back pain or lower extremity pain: Principal | ICD-10-CM | POA: Diagnosis present

## 2013-08-25 DIAGNOSIS — Y838 Other surgical procedures as the cause of abnormal reaction of the patient, or of later complication, without mention of misadventure at the time of the procedure: Secondary | ICD-10-CM | POA: Diagnosis not present

## 2013-08-25 DIAGNOSIS — M109 Gout, unspecified: Secondary | ICD-10-CM | POA: Diagnosis present

## 2013-08-25 DIAGNOSIS — Y9210 Unspecified residential institution as the place of occurrence of the external cause: Secondary | ICD-10-CM | POA: Diagnosis not present

## 2013-08-25 DIAGNOSIS — I498 Other specified cardiac arrhythmias: Secondary | ICD-10-CM | POA: Diagnosis not present

## 2013-08-25 DIAGNOSIS — G988 Other disorders of nervous system: Secondary | ICD-10-CM | POA: Diagnosis not present

## 2013-08-25 DIAGNOSIS — F329 Major depressive disorder, single episode, unspecified: Secondary | ICD-10-CM | POA: Diagnosis present

## 2013-08-25 DIAGNOSIS — K219 Gastro-esophageal reflux disease without esophagitis: Secondary | ICD-10-CM | POA: Diagnosis present

## 2013-08-25 DIAGNOSIS — F3289 Other specified depressive episodes: Secondary | ICD-10-CM | POA: Diagnosis present

## 2013-08-25 DIAGNOSIS — J45909 Unspecified asthma, uncomplicated: Secondary | ICD-10-CM | POA: Diagnosis present

## 2013-08-25 DIAGNOSIS — R0902 Hypoxemia: Secondary | ICD-10-CM

## 2013-08-25 HISTORY — PX: TRANSFORAMINAL LUMBAR INTERBODY FUSION (TLIF) WITH PEDICLE SCREW FIXATION 2 LEVEL: SHX6142

## 2013-08-25 HISTORY — PX: HEMATOMA EVACUATION: SHX5118

## 2013-08-25 SURGERY — EVACUATION HEMATOMA
Anesthesia: General | Site: Back | Wound class: Clean

## 2013-08-25 SURGERY — TRANSFORAMINAL LUMBAR INTERBODY FUSION (TLIF) WITH PEDICLE SCREW FIXATION 2 LEVEL
Anesthesia: General | Site: Back | Wound class: Clean

## 2013-08-25 MED ORDER — NALOXONE HCL 0.4 MG/ML IJ SOLN
0.4000 mg | INTRAMUSCULAR | Status: DC | PRN
  Filled 2013-08-25: qty 1

## 2013-08-25 MED ORDER — LACTATED RINGERS IV SOLN
INTRAVENOUS | Status: DC | PRN
  Administered 2013-08-25 (×3): via INTRAVENOUS

## 2013-08-25 MED ORDER — CEFAZOLIN SODIUM-DEXTROSE 2-3 GM-% IV SOLR
INTRAVENOUS | Status: AC
  Filled 2013-08-25: qty 50

## 2013-08-25 MED ORDER — DEXAMETHASONE SODIUM PHOSPHATE 10 MG/ML IJ SOLN
20.0000 mg | Freq: Once | INTRAMUSCULAR | Status: AC
  Administered 2013-08-25: 20 mg via INTRAVENOUS

## 2013-08-25 MED ORDER — DEXAMETHASONE SODIUM PHOSPHATE 4 MG/ML IJ SOLN
INTRAMUSCULAR | Status: DC | PRN
  Administered 2013-08-25: 8 mg via INTRAVENOUS

## 2013-08-25 MED ORDER — DIPHENHYDRAMINE HCL 12.5 MG/5ML PO ELIX
12.5000 mg | ORAL_SOLUTION | Freq: Four times a day (QID) | ORAL | Status: DC | PRN
  Filled 2013-08-25: qty 5

## 2013-08-25 MED ORDER — EPHEDRINE SULFATE 50 MG/ML IJ SOLN
INTRAMUSCULAR | Status: DC | PRN
  Administered 2013-08-25: 10 mg via INTRAVENOUS

## 2013-08-25 MED ORDER — BUPIVACAINE LIPOSOME 1.3 % IJ SUSP
INTRAMUSCULAR | Status: DC | PRN
  Administered 2013-08-25: 20 mL

## 2013-08-25 MED ORDER — FENTANYL CITRATE 0.05 MG/ML IJ SOLN
INTRAMUSCULAR | Status: DC | PRN
  Administered 2013-08-25: 25 ug via INTRAVENOUS
  Administered 2013-08-25: 100 ug via INTRAVENOUS
  Administered 2013-08-25: 50 ug via INTRAVENOUS
  Administered 2013-08-25: 150 ug via INTRAVENOUS
  Administered 2013-08-25: 25 ug via INTRAVENOUS

## 2013-08-25 MED ORDER — MEPERIDINE HCL 25 MG/ML IJ SOLN: 6.2500 mg | INTRAMUSCULAR | Status: DC | PRN

## 2013-08-25 MED ORDER — OXYCODONE HCL 5 MG PO TABS: 5.0000 mg | ORAL_TABLET | Freq: Once | ORAL | Status: AC | PRN

## 2013-08-25 MED ORDER — THROMBIN 5000 UNITS EX SOLR
OROMUCOSAL | Status: DC | PRN
  Administered 2013-08-25 (×2): via TOPICAL

## 2013-08-25 MED ORDER — HEPARIN SODIUM (PORCINE) 5000 UNIT/ML IJ SOLN
5000.0000 [IU] | Freq: Three times a day (TID) | INTRAMUSCULAR | Status: DC
  Administered 2013-08-26 – 2013-09-01 (×18): 5000 [IU] via SUBCUTANEOUS
  Filled 2013-08-25 (×24): qty 1

## 2013-08-25 MED ORDER — HYDROMORPHONE HCL PF 1 MG/ML IJ SOLN
0.2500 mg | INTRAMUSCULAR | Status: DC | PRN
  Administered 2013-08-26: 0.5 mg via INTRAVENOUS

## 2013-08-25 MED ORDER — MORPHINE SULFATE (PF) 1 MG/ML IV SOLN
INTRAVENOUS | Status: DC
  Administered 2013-08-25 – 2013-08-26 (×2): via INTRAVENOUS
  Administered 2013-08-26: 6 mg via INTRAVENOUS
  Administered 2013-08-26: 20:00:00 via INTRAVENOUS
  Administered 2013-08-26: 21 mg via INTRAVENOUS
  Administered 2013-08-26: 10:00:00 via INTRAVENOUS
  Administered 2013-08-27: 6 mg via INTRAVENOUS
  Administered 2013-08-27: 12 mg via INTRAVENOUS
  Administered 2013-08-27: 12:00:00 via INTRAVENOUS
  Administered 2013-08-27: 11.61 mg via INTRAVENOUS
  Administered 2013-08-28: 10.5 mg via INTRAVENOUS
  Administered 2013-08-28: 2.52 mg via INTRAVENOUS
  Administered 2013-08-28: 4.5 mg via INTRAVENOUS
  Administered 2013-08-28 (×2): 6 mg via INTRAVENOUS
  Administered 2013-08-28: 22:00:00 via INTRAVENOUS
  Administered 2013-08-28: 4.5 mg via INTRAVENOUS
  Administered 2013-08-29: 23:00:00 via INTRAVENOUS
  Administered 2013-08-29: 8 mg via INTRAVENOUS
  Administered 2013-08-29 (×2): 3 mg via INTRAVENOUS
  Administered 2013-08-29: 7.5 mg via INTRAVENOUS
  Administered 2013-08-29: 7.41 mg via INTRAVENOUS
  Administered 2013-08-29: 1.5 mg via INTRAVENOUS
  Administered 2013-08-30: 6 mg via INTRAVENOUS
  Administered 2013-08-30 (×2): 4.5 mg via INTRAVENOUS
  Administered 2013-08-30: 8.7 mg via INTRAVENOUS
  Administered 2013-08-30: 1.5 mg via INTRAVENOUS
  Administered 2013-08-30: 13:00:00 via INTRAVENOUS
  Administered 2013-08-31: 3 mg via INTRAVENOUS
  Administered 2013-08-31: 4.5 mg via INTRAVENOUS
  Administered 2013-08-31: 3 mg via INTRAVENOUS
  Administered 2013-08-31: 1.5 mg via INTRAVENOUS
  Filled 2013-08-25 (×9): qty 25

## 2013-08-25 MED ORDER — NEOSTIGMINE METHYLSULFATE 1 MG/ML IJ SOLN
INTRAMUSCULAR | Status: DC | PRN
  Administered 2013-08-25: 4 mg via INTRAVENOUS

## 2013-08-25 MED ORDER — ONDANSETRON HCL 4 MG/2ML IJ SOLN
INTRAMUSCULAR | Status: DC | PRN
  Administered 2013-08-25: 4 mg via INTRAVENOUS

## 2013-08-25 MED ORDER — ALBUMIN HUMAN 5 % IV SOLN
INTRAVENOUS | Status: AC
  Filled 2013-08-25: qty 250

## 2013-08-25 MED ORDER — ONDANSETRON HCL 4 MG/2ML IJ SOLN
4.0000 mg | Freq: Four times a day (QID) | INTRAMUSCULAR | Status: DC | PRN
  Administered 2013-08-25: 4 mg via INTRAVENOUS
  Filled 2013-08-25: qty 2

## 2013-08-25 MED ORDER — OXYCODONE HCL 5 MG/5ML PO SOLN: 5.0000 mg | Freq: Once | ORAL | Status: AC | PRN

## 2013-08-25 MED ORDER — DIPHENHYDRAMINE HCL 50 MG/ML IJ SOLN
12.5000 mg | Freq: Four times a day (QID) | INTRAMUSCULAR | Status: DC | PRN
  Filled 2013-08-25: qty 0.25

## 2013-08-25 MED ORDER — PHENYLEPHRINE HCL 10 MG/ML IJ SOLN
INTRAMUSCULAR | Status: DC | PRN
  Administered 2013-08-25: 80 ug via INTRAVENOUS

## 2013-08-25 MED ORDER — THROMBIN 20000 UNITS EX SOLR
CUTANEOUS | Status: DC | PRN
  Administered 2013-08-25 (×2): via TOPICAL

## 2013-08-25 MED ORDER — HEMOSTATIC AGENTS (NO CHARGE) OPTIME
TOPICAL | Status: DC | PRN
  Administered 2013-08-25: 1 via TOPICAL

## 2013-08-25 MED ORDER — ALBUMIN HUMAN 5 % IV SOLN
INTRAVENOUS | Status: DC | PRN
  Administered 2013-08-25 (×2): via INTRAVENOUS

## 2013-08-25 MED ORDER — DEXAMETHASONE SODIUM PHOSPHATE 10 MG/ML IJ SOLN
INTRAMUSCULAR | Status: AC
  Administered 2013-08-25: 20 mg via INTRAVENOUS
  Filled 2013-08-25: qty 2

## 2013-08-25 MED ORDER — SODIUM CHLORIDE 0.9 % IJ SOLN: 9.0000 mL | INTRAMUSCULAR | Status: DC | PRN

## 2013-08-25 MED ORDER — PHENYLEPHRINE HCL 10 MG/ML IJ SOLN
10.0000 mg | INTRAVENOUS | Status: DC | PRN
  Administered 2013-08-25: 20 ug/min via INTRAVENOUS

## 2013-08-25 MED ORDER — LIDOCAINE HCL (CARDIAC) 20 MG/ML IV SOLN
INTRAVENOUS | Status: DC | PRN
  Administered 2013-08-25: 100 mg via INTRAVENOUS

## 2013-08-25 MED ORDER — CEFAZOLIN SODIUM-DEXTROSE 2-3 GM-% IV SOLR
INTRAVENOUS | Status: AC
  Administered 2013-08-25: 2 g via INTRAVENOUS
  Filled 2013-08-25: qty 50

## 2013-08-25 MED ORDER — PHENOL 1.4 % MT LIQD: 1.0000 | OROMUCOSAL | Status: DC | PRN

## 2013-08-25 MED ORDER — ROCURONIUM BROMIDE 100 MG/10ML IV SOLN
INTRAVENOUS | Status: DC | PRN
  Administered 2013-08-25: 10 mg via INTRAVENOUS
  Administered 2013-08-25: 50 mg via INTRAVENOUS
  Administered 2013-08-25: 10 mg via INTRAVENOUS

## 2013-08-25 MED ORDER — BACITRACIN ZINC 500 UNIT/GM EX OINT
TOPICAL_OINTMENT | CUTANEOUS | Status: DC | PRN
  Administered 2013-08-25: 1 via TOPICAL

## 2013-08-25 MED ORDER — SUFENTANIL CITRATE 50 MCG/ML IV SOLN
INTRAVENOUS | Status: DC | PRN
  Administered 2013-08-25: 30 ug via INTRAVENOUS

## 2013-08-25 MED ORDER — THROMBIN 5000 UNITS EX SOLR
CUTANEOUS | Status: DC | PRN
  Administered 2013-08-25 (×2): 5000 [IU] via TOPICAL

## 2013-08-25 MED ORDER — METOCLOPRAMIDE HCL 5 MG/ML IJ SOLN
10.0000 mg | Freq: Once | INTRAMUSCULAR | Status: AC | PRN
  Administered 2013-08-25: 10 mg via INTRAVENOUS

## 2013-08-25 MED ORDER — LACTATED RINGERS IV SOLN
INTRAVENOUS | Status: DC | PRN
  Administered 2013-08-25 (×2): via INTRAVENOUS

## 2013-08-25 MED ORDER — HYDROMORPHONE HCL PF 1 MG/ML IJ SOLN
0.2500 mg | INTRAMUSCULAR | Status: DC | PRN
  Administered 2013-08-25 (×2): 0.5 mg via INTRAVENOUS

## 2013-08-25 MED ORDER — 0.9 % SODIUM CHLORIDE (POUR BTL) OPTIME
TOPICAL | Status: DC | PRN
  Administered 2013-08-25 (×2): 1000 mL

## 2013-08-25 MED ORDER — GLYCOPYRROLATE 0.2 MG/ML IJ SOLN
INTRAMUSCULAR | Status: DC | PRN
  Administered 2013-08-25: 0.2 mg via INTRAVENOUS

## 2013-08-25 MED ORDER — ARTIFICIAL TEARS OP OINT
TOPICAL_OINTMENT | OPHTHALMIC | Status: DC | PRN
  Administered 2013-08-25: 1 via OPHTHALMIC

## 2013-08-25 MED ORDER — 0.9 % SODIUM CHLORIDE (POUR BTL) OPTIME
TOPICAL | Status: DC | PRN
  Administered 2013-08-25: 1000 mL

## 2013-08-25 MED ORDER — MIDAZOLAM HCL 5 MG/5ML IJ SOLN
INTRAMUSCULAR | Status: DC | PRN
  Administered 2013-08-25: 2 mg via INTRAVENOUS

## 2013-08-25 MED ORDER — MORPHINE SULFATE (PF) 1 MG/ML IV SOLN
INTRAVENOUS | Status: AC
  Filled 2013-08-25: qty 25

## 2013-08-25 MED ORDER — HYDROMORPHONE HCL PF 1 MG/ML IJ SOLN
INTRAMUSCULAR | Status: AC
  Filled 2013-08-25: qty 1

## 2013-08-25 MED ORDER — BUPIVACAINE LIPOSOME 1.3 % IJ SUSP
20.0000 mL | Freq: Once | INTRAMUSCULAR | Status: DC
  Filled 2013-08-25: qty 20

## 2013-08-25 MED ORDER — LACTATED RINGERS IV SOLN
INTRAVENOUS | Status: DC
  Administered 2013-08-25: 08:00:00 via INTRAVENOUS

## 2013-08-25 MED ORDER — PROPOFOL 10 MG/ML IV BOLUS
INTRAVENOUS | Status: DC | PRN
  Administered 2013-08-25: 120 mg via INTRAVENOUS

## 2013-08-25 MED ORDER — ALBUMIN HUMAN 25 % IV SOLN
12.5000 g | Freq: Once | INTRAVENOUS | Status: AC
  Administered 2013-08-25: 12.5 g via INTRAVENOUS

## 2013-08-25 MED ORDER — MENTHOL 3 MG MT LOZG: 1.0000 | LOZENGE | OROMUCOSAL | Status: DC | PRN

## 2013-08-25 MED ORDER — SODIUM CHLORIDE 0.9 % IV SOLN
INTRAVENOUS | Status: DC | PRN
  Administered 2013-08-25: 13:00:00 via INTRAVENOUS

## 2013-08-25 MED ORDER — ONDANSETRON HCL 4 MG/2ML IJ SOLN
INTRAMUSCULAR | Status: AC
  Administered 2013-08-25: 4 mg via INTRAVENOUS
  Filled 2013-08-25: qty 2

## 2013-08-25 MED ORDER — GLYCOPYRROLATE 0.2 MG/ML IJ SOLN
INTRAMUSCULAR | Status: DC | PRN
  Administered 2013-08-25: 0.2 mg via INTRAVENOUS
  Administered 2013-08-25: .7 mg via INTRAVENOUS

## 2013-08-25 MED ORDER — PROPOFOL 10 MG/ML IV BOLUS
INTRAVENOUS | Status: DC | PRN
  Administered 2013-08-25: 200 mg via INTRAVENOUS

## 2013-08-25 MED ORDER — SUCCINYLCHOLINE CHLORIDE 20 MG/ML IJ SOLN
INTRAMUSCULAR | Status: DC | PRN
  Administered 2013-08-25: 100 mg via INTRAVENOUS

## 2013-08-25 MED ORDER — METOCLOPRAMIDE HCL 5 MG/ML IJ SOLN
INTRAMUSCULAR | Status: AC
  Filled 2013-08-25: qty 2

## 2013-08-25 MED ORDER — ONDANSETRON HCL 4 MG/2ML IJ SOLN: 4.0000 mg | Freq: Once | INTRAMUSCULAR | Status: AC | PRN

## 2013-08-25 SURGICAL SUPPLY — 77 items
BENZOIN TINCTURE PRP APPL 2/3 (GAUZE/BANDAGES/DRESSINGS) ×3 IMPLANT
BLADE SURG ROTATE 9660 (MISCELLANEOUS) IMPLANT
BUR ACORN 6.0 (BURR) ×3 IMPLANT
BUR MATCHSTICK NEURO 3.0 LAGG (BURR) ×3 IMPLANT
CAGE SPINAL SIGNATURE 12 (Cage) ×3 IMPLANT
CANISTER SUCT 3000ML (MISCELLANEOUS) ×3 IMPLANT
CAP REVERE LOCKING (Cap) ×21 IMPLANT
CONN CROSSLINK REV 6.35 48-60 (Connector) ×3 IMPLANT
CONNECTOR CRSLNK REV6.35 48-60 (Connector) ×2 IMPLANT
CONT SPEC 4OZ CLIKSEAL STRL BL (MISCELLANEOUS) ×6 IMPLANT
COVER BACK TABLE 24X17X13 BIG (DRAPES) IMPLANT
COVER TABLE BACK 60X90 (DRAPES) ×3 IMPLANT
DRAPE C-ARM 42X72 X-RAY (DRAPES) ×9 IMPLANT
DRAPE LAPAROTOMY 100X72X124 (DRAPES) ×3 IMPLANT
DRAPE MICROSCOPE LEICA (MISCELLANEOUS) ×3 IMPLANT
DRAPE POUCH INSTRU U-SHP 10X18 (DRAPES) ×3 IMPLANT
DRSG PAD ABDOMINAL 8X10 ST (GAUZE/BANDAGES/DRESSINGS) ×3 IMPLANT
DURAFORM COLLAGEN 1X1 5-PACK (Neuro Prosthesis/Implant) ×3 IMPLANT
DURAPREP 26ML APPLICATOR (WOUND CARE) ×3 IMPLANT
DURASEAL APPLICATOR TIP (TIP) ×3 IMPLANT
DURASEAL SPINE SEALANT 3ML (MISCELLANEOUS) ×3 IMPLANT
ELECT BLADE 4.0 EZ CLEAN MEGAD (MISCELLANEOUS) ×3
ELECT REM PT RETURN 9FT ADLT (ELECTROSURGICAL) ×3
ELECTRODE BLDE 4.0 EZ CLN MEGD (MISCELLANEOUS) ×2 IMPLANT
ELECTRODE REM PT RTRN 9FT ADLT (ELECTROSURGICAL) ×2 IMPLANT
EVACUATOR 1/8 PVC DRAIN (DRAIN) IMPLANT
GAUZE SPONGE 4X4 16PLY XRAY LF (GAUZE/BANDAGES/DRESSINGS) ×6 IMPLANT
GLOVE BIOGEL M 8.0 STRL (GLOVE) ×6 IMPLANT
GLOVE BIOGEL PI IND STRL 7.0 (GLOVE) ×6 IMPLANT
GLOVE BIOGEL PI IND STRL 7.5 (GLOVE) ×4 IMPLANT
GLOVE BIOGEL PI INDICATOR 7.0 (GLOVE) ×3
GLOVE BIOGEL PI INDICATOR 7.5 (GLOVE) ×2
GLOVE ECLIPSE 7.5 STRL STRAW (GLOVE) ×3 IMPLANT
GLOVE EXAM NITRILE LRG STRL (GLOVE) IMPLANT
GLOVE EXAM NITRILE MD LF STRL (GLOVE) IMPLANT
GLOVE EXAM NITRILE XL STR (GLOVE) IMPLANT
GLOVE EXAM NITRILE XS STR PU (GLOVE) IMPLANT
GLOVE SURG SS PI 7.0 STRL IVOR (GLOVE) ×15 IMPLANT
GOWN BRE IMP SLV AUR LG STRL (GOWN DISPOSABLE) ×3 IMPLANT
GOWN BRE IMP SLV AUR XL STRL (GOWN DISPOSABLE) ×15 IMPLANT
GOWN STRL REIN 2XL LVL4 (GOWN DISPOSABLE) IMPLANT
KIT BASIN OR (CUSTOM PROCEDURE TRAY) ×3 IMPLANT
KIT INFUSE MEDIUM (Orthopedic Implant) ×3 IMPLANT
KIT ROOM TURNOVER OR (KITS) ×3 IMPLANT
NEEDLE HYPO 18GX1.5 BLUNT FILL (NEEDLE) IMPLANT
NEEDLE HYPO 21X1.5 SAFETY (NEEDLE) ×3 IMPLANT
NEEDLE HYPO 25X1 1.5 SAFETY (NEEDLE) IMPLANT
NS IRRIG 1000ML POUR BTL (IV SOLUTION) ×3 IMPLANT
PACK FOAM VITOSS 10CC (Orthopedic Implant) ×6 IMPLANT
PACK LAMINECTOMY NEURO (CUSTOM PROCEDURE TRAY) ×3 IMPLANT
PAD ARMBOARD 7.5X6 YLW CONV (MISCELLANEOUS) ×9 IMPLANT
PATTIES SURGICAL .5 X1 (DISPOSABLE) ×3 IMPLANT
PATTIES SURGICAL .5 X3 (DISPOSABLE) IMPLANT
PATTIES SURGICAL 1X1 (DISPOSABLE) ×9 IMPLANT
ROD REVERE 7.5MM (Rod) ×6 IMPLANT
RUBBERBAND STERILE (MISCELLANEOUS) ×6 IMPLANT
SCREW 5.5X55 (Screw) ×6 IMPLANT
SCREW REVERE 5.5X45 (Screw) ×12 IMPLANT
SLEEVE SURGEON STRL (DRAPES) ×3 IMPLANT
SPACER TLIF SIGNATURE SM 8MM ×3 IMPLANT
SPONGE GAUZE 4X4 12PLY (GAUZE/BANDAGES/DRESSINGS) ×3 IMPLANT
SPONGE LAP 4X18 X RAY DECT (DISPOSABLE) IMPLANT
SPONGE NEURO XRAY DETECT 1X3 (DISPOSABLE) IMPLANT
SPONGE SURGIFOAM ABS GEL 100 (HEMOSTASIS) ×3 IMPLANT
STRIP CLOSURE SKIN 1/2X4 (GAUZE/BANDAGES/DRESSINGS) ×3 IMPLANT
SUT VIC AB 1 CT1 18XBRD ANBCTR (SUTURE) ×6 IMPLANT
SUT VIC AB 1 CT1 8-18 (SUTURE) ×3
SUT VIC AB 2-0 CP2 18 (SUTURE) ×6 IMPLANT
SUT VIC AB 3-0 SH 8-18 (SUTURE) ×6 IMPLANT
SYR 20CC LL (SYRINGE) ×3 IMPLANT
SYR 20ML ECCENTRIC (SYRINGE) ×3 IMPLANT
SYR 5ML LL (SYRINGE) IMPLANT
TAPE CLOTH SURG 4X10 WHT LF (GAUZE/BANDAGES/DRESSINGS) ×3 IMPLANT
TOWEL OR 17X24 6PK STRL BLUE (TOWEL DISPOSABLE) ×3 IMPLANT
TOWEL OR 17X26 10 PK STRL BLUE (TOWEL DISPOSABLE) ×3 IMPLANT
TRAY FOLEY CATH 14FRSI W/METER (CATHETERS) ×3 IMPLANT
WATER STERILE IRR 1000ML POUR (IV SOLUTION) ×3 IMPLANT

## 2013-08-25 SURGICAL SUPPLY — 15 items
DRAPE LAPAROTOMY 100X72X124 (DRAPES) ×2 IMPLANT
DRESSING TELFA 8X3 (GAUZE/BANDAGES/DRESSINGS) ×2 IMPLANT
EVACUATOR 3/16  PVC DRAIN (DRAIN) ×1
EVACUATOR 3/16 PVC DRAIN (DRAIN) ×1 IMPLANT
GLOVE BIO SURGEON STRL SZ 6.5 (GLOVE) ×2 IMPLANT
GLOVE BIOGEL M 8.0 STRL (GLOVE) ×2 IMPLANT
GLOVE BIOGEL PI IND STRL 6.5 (GLOVE) ×1 IMPLANT
GLOVE BIOGEL PI INDICATOR 6.5 (GLOVE) ×1
KIT BASIN OR (CUSTOM PROCEDURE TRAY) ×2 IMPLANT
PACK LAMINECTOMY NEURO (CUSTOM PROCEDURE TRAY) ×2 IMPLANT
SUT ETHILON 3 0 FSL (SUTURE) ×2 IMPLANT
SUT VIC AB 2-0 CP2 18 (SUTURE) ×2 IMPLANT
SUT VIC AB 3-0 SH 8-18 (SUTURE) ×2 IMPLANT
TAPE CLOTH SURG 4X10 WHT LF (GAUZE/BANDAGES/DRESSINGS) ×2 IMPLANT
TOWEL OR 17X26 10 PK STRL BLUE (TOWEL DISPOSABLE) ×2 IMPLANT

## 2013-08-25 NOTE — Anesthesia Preprocedure Evaluation (Signed)
Anesthesia Evaluation  Patient identified by MRN, date of birth, ID band Patient awake    Reviewed: Allergy & Precautions, H&P , NPO status , Patient's Chart, lab work & pertinent test results, reviewed documented beta blocker date and time   History of Anesthesia Complications (+) PONV and history of anesthetic complications  Airway Mallampati: II TM Distance: >3 FB Neck ROM: full    Dental   Pulmonary pneumonia -, resolved,  breath sounds clear to auscultation        Cardiovascular hypertension, On Medications Rhythm:regular     Neuro/Psych PSYCHIATRIC DISORDERS negative neurological ROS     GI/Hepatic Neg liver ROS, hiatal hernia, PUD, GERD-  Medicated and Controlled,  Endo/Other  Hypothyroidism   Renal/GU negative Renal ROS  negative genitourinary   Musculoskeletal   Abdominal   Peds  Hematology negative hematology ROS (+)   Anesthesia Other Findings See surgeon's H&P   Reproductive/Obstetrics negative OB ROS                           Anesthesia Physical Anesthesia Plan  ASA: II  Anesthesia Plan: General   Post-op Pain Management:    Induction: Intravenous  Airway Management Planned: Oral ETT  Additional Equipment:   Intra-op Plan:   Post-operative Plan: Extubation in OR  Informed Consent: I have reviewed the patients History and Physical, chart, labs and discussed the procedure including the risks, benefits and alternatives for the proposed anesthesia with the patient or authorized representative who has indicated his/her understanding and acceptance.   Dental Advisory Given  Plan Discussed with: CRNA and Surgeon  Anesthesia Plan Comments:         Anesthesia Quick Evaluation

## 2013-08-25 NOTE — Anesthesia Procedure Notes (Signed)
Procedure Name: Intubation Date/Time: 08/25/2013 11:05 AM Performed by: Vita Barley E Pre-anesthesia Checklist: Patient identified, Emergency Drugs available, Suction available and Patient being monitored Patient Re-evaluated:Patient Re-evaluated prior to inductionOxygen Delivery Method: Circle system utilized Preoxygenation: Pre-oxygenation with 100% oxygen Intubation Type: IV induction Ventilation: Mask ventilation without difficulty Laryngoscope Size: Miller and 2 Grade View: Grade I Tube type: Oral Tube size: 7.0 mm Number of attempts: 1 Airway Equipment and Method: Stylet Placement Confirmation: ETT inserted through vocal cords under direct vision,  positive ETCO2 and breath sounds checked- equal and bilateral Secured at: 22 cm Tube secured with: Tape Dental Injury: Teeth and Oropharynx as per pre-operative assessment

## 2013-08-25 NOTE — Anesthesia Preprocedure Evaluation (Signed)
Anesthesia Evaluation  Patient identified by MRN, date of birth, ID band Patient awake    Reviewed: Allergy & Precautions, H&P , NPO status , Patient's Chart, lab work & pertinent test results  History of Anesthesia Complications (+) PONV  Airway Mallampati: I TM Distance: >3 FB Neck ROM: Full    Dental   Pulmonary          Cardiovascular hypertension, Pt. on medications     Neuro/Psych    GI/Hepatic GERD-  Medicated and Controlled,  Endo/Other  Hypothyroidism   Renal/GU      Musculoskeletal   Abdominal   Peds  Hematology   Anesthesia Other Findings   Reproductive/Obstetrics                           Anesthesia Physical Anesthesia Plan  ASA: III and emergent  Anesthesia Plan: General   Post-op Pain Management:    Induction: Intravenous  Airway Management Planned: Oral ETT  Additional Equipment:   Intra-op Plan:   Post-operative Plan: Extubation in OR  Informed Consent: I have reviewed the patients History and Physical, chart, labs and discussed the procedure including the risks, benefits and alternatives for the proposed anesthesia with the patient or authorized representative who has indicated his/her understanding and acceptance.     Plan Discussed with: CRNA and Surgeon  Anesthesia Plan Comments: (Pt brought back to OR for exploration of lumbar wound.)        Anesthesia Quick Evaluation

## 2013-08-25 NOTE — Progress Notes (Signed)
Pt. Will attempt to move knees up when asked to , will not wiggle toes

## 2013-08-25 NOTE — Progress Notes (Signed)
Pt. With low BP on arrival to pacu and not able to wiggle toes, Dr Jeral Fruit at bedside, Istat drawn for cbc per CRNA RIchard A.

## 2013-08-25 NOTE — Progress Notes (Signed)
OO NOTE [phone]

## 2013-08-25 NOTE — Preoperative (Signed)
Beta Blockers   Reason not to administer Beta Blockers:Not Applicable 

## 2013-08-25 NOTE — Progress Notes (Signed)
Dr.Edwards aware of pts BP low still, orders noted, also Dr.BOtero at bedside to see pt

## 2013-08-25 NOTE — Anesthesia Postprocedure Evaluation (Signed)
  Anesthesia Post-op Note  Patient: Sherry Wong  Procedure(s) Performed: Procedure(s):  Lumbar four-five, Lumbar five-Sacral one Transforaminal Lumbar Interbody Fusion  (N/A)  Patient Location: PACU  Anesthesia Type:General  Level of Consciousness: awake  Airway and Oxygen Therapy: Patient Spontanous Breathing  Post-op Pain: mild  Post-op Assessment: Post-op Vital signs reviewed  Post-op Vital Signs: Reviewed  Complications: No apparent anesthesia complications

## 2013-08-25 NOTE — Transfer of Care (Signed)
Immediate Anesthesia Transfer of Care Note  Patient: Sherry Wong  Procedure(s) Performed: Procedure(s):  Lumbar four-five, Lumbar five-Sacral one Transforaminal Lumbar Interbody Fusion  (N/A)  Patient Location: PACU  Anesthesia Type:General  Level of Consciousness: awake and alert   Airway & Oxygen Therapy: Patient Spontanous Breathing and Patient connected to nasal cannula oxygen  Post-op Assessment: Report given to PACU RN and Post -op Vital signs reviewed and stable  Post vital signs: Reviewed and stable  Complications: No apparent anesthesia complications

## 2013-08-25 NOTE — Anesthesia Procedure Notes (Signed)
Procedure Name: Intubation Date/Time: 08/25/2013 10:34 PM Performed by: Melina Schools Pre-anesthesia Checklist: Patient identified, Emergency Drugs available, Suction available and Patient being monitored Patient Re-evaluated:Patient Re-evaluated prior to inductionOxygen Delivery Method: Circle system utilized Preoxygenation: Pre-oxygenation with 100% oxygen Intubation Type: IV induction, Cricoid Pressure applied and Rapid sequence Ventilation: Mask ventilation without difficulty Laryngoscope Size: 3 and Mac Grade View: Grade I Tube type: Oral Tube size: 7.5 mm Number of attempts: 1 Airway Equipment and Method: Stylet Placement Confirmation: ETT inserted through vocal cords under direct vision,  positive ETCO2 and breath sounds checked- equal and bilateral Secured at: 22 cm Tube secured with: Tape

## 2013-08-25 NOTE — H&P (Signed)
Sherry Wong is an 57 y.o. female.   Chief Complaint: lbp HPI: patient who had laminotomy and foraminotomies at the level of l4-5, 5S1 in the past ,now comes to my office with increase of lbp with radiation to both legs. She has had conservative teratment witn no benefits. Myelogram was done  Past Medical History  Diagnosis Date  . PONV (postoperative nausea and vomiting)   . Hypertension     takes Losartan daily  . Cough   . Sinus drainage   . Pneumonia     hx of 15+yrs ago  . Chronic back pain     ddd/lumbago,and radiculopathy  . Arthritis   . Gout     hx of  . GERD (gastroesophageal reflux disease)     takes Protonix daily  . H/O hiatal hernia   . Gastric ulcer   . Ulcerative colitis   . IBS (irritable bowel syndrome)   . Constipation   . Diarrhea   . Hx of colonic polyps   . Urinary incontinence   . Nocturia   . Hypothyroidism     takes Levothyroxine daily  . Depression   . Anxiety     takes celexa daily    Past Surgical History  Procedure Laterality Date  . Cysts removed from ovary    . Cholecystectomy    . Dilation and curettage of uterus    . Breast surgery      left breast lumpectomy  . Breast biopsy      right  . Colonoscopy    . Esophagogastroduodenoscopy    . Lumbar laminectomy/decompression microdiscectomy  12/03/2011    Procedure: LUMBAR LAMINECTOMY/DECOMPRESSION MICRODISCECTOMY 2 LEVELS;  Surgeon: Karn Cassis, MD;  Location: [facility];  Service: Neurosurgery;  Laterality: Right;  Right Lumbar four-five Lumbar five sacral one Foraminotomies    Family History  Problem Relation Age of Onset  . Anesthesia problems Sister   . Hypotension Neg Hx   . Malignant hyperthermia Neg Hx   . Pseudochol deficiency Neg Hx   . Anesthesia problems Mother    Social History:  reports that she has never smoked. She has never used smokeless tobacco. She reports that she drinks alcohol. She reports that she does not use illicit drugs.  Allergies:   Allergies  Allergen Reactions  . Codeine Nausea Only    Medications Prior to Admission  Medication Sig Dispense Refill  . citalopram (CELEXA) 20 MG tablet Take 20 mg by mouth daily.      . diazepam (VALIUM) 5 MG tablet Take 5 mg by mouth every 8 (eight) hours as needed for muscle spasms.      [name] HYDROcodone-acetaminophen (NORCO) 10-325 MG per tablet Take 1 tablet by mouth every 6 (six) hours as needed (for pain).      . hyoscyamine (LEVSIN, ANASPAZ) 0.125 MG tablet Take 0.125 mg by mouth 3 (three) times daily.      [name] levothyroxine (SYNTHROID, LEVOTHROID) 150 MCG tablet Take 150 mcg by mouth daily.      [name] losartan-hydrochlorothiazide (HYZAAR) 50-12.5 MG per tablet Take 1 tablet by mouth daily.      . pantoprazole (PROTONIX) 40 MG tablet Take 40 mg by mouth daily.      [name] albuterol (PROVENTIL HFA;VENTOLIN HFA) 108 (90 BASE) MCG/ACT inhaler Inhale 2 puffs into the lungs 2 (two) times daily.        No results found for this or any previous visit (from the past 48 hour(s)). No results found.  Review of Systems  Constitutional: Negative.   HENT: Negative.   Eyes: Negative.   Respiratory: Negative.   Cardiovascular: Negative.   Gastrointestinal: Positive for abdominal pain.  Genitourinary: Positive for urgency.  Musculoskeletal: Positive for back pain.  Skin: Negative.   Neurological: Positive for sensory change and focal weakness.  Endo/Heme/Allergies: Negative.   Psychiatric/Behavioral: Positive for depression.    Blood pressure 127/82, pulse 67, temperature 98.3 F (36.8 C), SpO2 100.00%. Physical Exam hent, nl. Neck, nl. Cv, nl. Lungs, clear. Abdomen, soft. Extremities, nl. NEURO  SLR positive at 60 degrees. Decrease of flexibility of lumbar spine. Myelo shows ddd changes at l45, l5 S1 with worsening at l5S1   Assessment/Plan Decompression and fusion at l45, l5 S1. She and her husband are aware of tisks and benefits. They declined a second opinion      Ahan Eisenberger  M 08/25/2013, 10:32 AM

## 2013-08-25 NOTE — Progress Notes (Signed)
Ms Sherry Wong had 2 level fusion in the lumbar area. Because thining of the duramater with arachnoid pouches  i opted out for using  A drain to avoid csf leak. The patient woke up with n/v with proximal movement of both legs. Had a foley in place. Rectal tone was positive,. i kept her in pacu and after i did my cervical decompression and giving iv decadron i saw her again. She has more movemenT but not as preop. i did talk to her and her husband by phone. [phone]). We agree with exploration of the lumbar area to r/o the possibility of epidural hematoma. DR [id number] WHO IS ON CALL AGREES WITH THE APPROACH. WILL be taken to the or iN THE next minutes

## 2013-08-26 DIAGNOSIS — R0902 Hypoxemia: Secondary | ICD-10-CM

## 2013-08-26 DIAGNOSIS — E039 Hypothyroidism, unspecified: Secondary | ICD-10-CM

## 2013-08-26 DIAGNOSIS — I10 Essential (primary) hypertension: Secondary | ICD-10-CM

## 2013-08-26 LAB — PREPARE RBC (CROSSMATCH)

## 2013-08-26 LAB — PHOSPHORUS: Phosphorus: 2.5 mg/dL (ref 2.3–4.6)

## 2013-08-26 LAB — CBC
HCT: 21.5 % — ABNORMAL LOW (ref 36.0–46.0)
HCT: 28.1 % — ABNORMAL LOW (ref 36.0–46.0)
Hemoglobin: 7.5 g/dL — ABNORMAL LOW (ref 12.0–15.0)
Hemoglobin: 10 g/dL — ABNORMAL LOW (ref 12.0–15.0)
MCH: 31.5 pg (ref 26.0–34.0)
MCH: 30.9 pg (ref 26.0–34.0)
MCHC: 34.9 g/dL (ref 30.0–36.0)
MCHC: 35.6 g/dL (ref 30.0–36.0)
MCV: 90.3 fL (ref 78.0–100.0)
MCV: 86.7 fL (ref 78.0–100.0)
Platelets: 138 10*3/uL — ABNORMAL LOW (ref 150–400)
Platelets: 137 10*3/uL — ABNORMAL LOW (ref 150–400)
RBC: 2.38 MIL/uL — ABNORMAL LOW (ref 3.87–5.11)
RBC: 3.24 MIL/uL — ABNORMAL LOW (ref 3.87–5.11)
RDW: 13.7 % (ref 11.5–15.5)
RDW: 15.1 % (ref 11.5–15.5)
WBC: 10.1 10*3/uL (ref 4.0–10.5)
WBC: 11.9 10*3/uL — ABNORMAL HIGH (ref 4.0–10.5)

## 2013-08-26 LAB — BASIC METABOLIC PANEL
BUN: 10 mg/dL (ref 6–23)
CO2: 26 mEq/L (ref 19–32)
Calcium: 8.5 mg/dL (ref 8.4–10.5)
Chloride: 108 mEq/L (ref 96–112)
Creatinine, Ser: 0.66 mg/dL (ref 0.50–1.10)
GFR calc Af Amer: >90 mL/min (ref >90)
GFR calc non Af Amer: >90 mL/min (ref >90)
Glucose, Bld: 135 mg/dL — ABNORMAL HIGH (ref 70–99)
Potassium: 4.1 mEq/L (ref 3.5–5.1)
Sodium: 141 mEq/L (ref 135–145)

## 2013-08-26 LAB — MAGNESIUM: Magnesium: 1.9 mg/dL (ref 1.5–2.5)

## 2013-08-26 LAB — MRSA PCR SCREENING: MRSA by PCR: NEGATIVE

## 2013-08-26 MED ORDER — SODIUM CHLORIDE 0.9 % IJ SOLN
3.0000 mL | INTRAMUSCULAR | Status: DC | PRN
  Administered 2013-08-27: 3 mL via INTRAVENOUS

## 2013-08-26 MED ORDER — DIAZEPAM 5 MG PO TABS
10.0000 mg | ORAL_TABLET | Freq: Four times a day (QID) | ORAL | Status: DC | PRN
  Administered 2013-08-28 – 2013-08-31 (×6): 10 mg via ORAL
  Filled 2013-08-26 (×6): qty 2

## 2013-08-26 MED ORDER — OXYCODONE-ACETAMINOPHEN 5-325 MG PO TABS
1.0000 | ORAL_TABLET | ORAL | Status: DC | PRN
  Administered 2013-08-30 – 2013-08-31 (×7): 1 via ORAL
  Administered 2013-09-01: 2 via ORAL
  Administered 2013-09-01 (×2): 1 via ORAL
  Filled 2013-08-26 (×9): qty 1
  Filled 2013-08-26: qty 2

## 2013-08-26 MED ORDER — CEFAZOLIN SODIUM 1-5 GM-% IV SOLN
1.0000 g | Freq: Three times a day (TID) | INTRAVENOUS | Status: AC
  Administered 2013-08-26 (×2): 1 g via INTRAVENOUS
  Filled 2013-08-26 (×2): qty 50

## 2013-08-26 MED ORDER — HYDROMORPHONE HCL PF 1 MG/ML IJ SOLN
INTRAMUSCULAR | Status: AC
  Filled 2013-08-26: qty 1

## 2013-08-26 MED ORDER — LEVOTHYROXINE SODIUM 100 MCG IV SOLR
75.0000 ug | Freq: Every day | INTRAVENOUS | Status: DC
  Administered 2013-08-26: 75 ug via INTRAVENOUS
  Filled 2013-08-26 (×2): qty 5

## 2013-08-26 MED ORDER — HEPARIN SODIUM (PORCINE) 5000 UNIT/ML IJ SOLN: 5000.0000 [IU] | Freq: Three times a day (TID) | INTRAMUSCULAR | Status: DC

## 2013-08-26 MED ORDER — ACETAMINOPHEN 325 MG PO TABS
650.0000 mg | ORAL_TABLET | ORAL | Status: DC | PRN
  Administered 2013-08-27: 650 mg via ORAL
  Filled 2013-08-26: qty 2

## 2013-08-26 MED ORDER — SODIUM CHLORIDE 0.9 % IV SOLN
INTRAVENOUS | Status: DC
  Administered 2013-08-26: 75 mL/h via INTRAVENOUS
  Administered 2013-08-27 – 2013-08-30 (×4): via INTRAVENOUS

## 2013-08-26 MED ORDER — SODIUM CHLORIDE 0.9 % IJ SOLN
3.0000 mL | Freq: Two times a day (BID) | INTRAMUSCULAR | Status: DC
  Administered 2013-08-26 – 2013-08-31 (×9): 3 mL via INTRAVENOUS

## 2013-08-26 MED ORDER — ACETAMINOPHEN 650 MG RE SUPP: 650.0000 mg | RECTAL | Status: DC | PRN

## 2013-08-26 MED ORDER — ONDANSETRON HCL 4 MG/2ML IJ SOLN: 4.0000 mg | INTRAMUSCULAR | Status: DC | PRN

## 2013-08-26 MED ORDER — DEXAMETHASONE SODIUM PHOSPHATE 4 MG/ML IJ SOLN
8.0000 mg | Freq: Four times a day (QID) | INTRAMUSCULAR | Status: DC
  Administered 2013-08-26 – 2013-08-27 (×4): 8 mg via INTRAVENOUS
  Filled 2013-08-26 (×8): qty 2

## 2013-08-26 MED ORDER — ZOLPIDEM TARTRATE 5 MG PO TABS: 5.0000 mg | ORAL_TABLET | Freq: Every evening | ORAL | Status: DC | PRN

## 2013-08-26 MED ORDER — SODIUM CHLORIDE 0.9 % IV SOLN: 250.0000 mL | INTRAVENOUS | Status: DC

## 2013-08-26 NOTE — Consult Note (Signed)
PULMONARY  / CRITICAL CARE MEDICINE  Name: Sherry Wong MRN: [id number] DOB: 1955-12-01    ADMISSION DATE:  08/25/2013 CONSULTATION DATE:  08/26/2013  REFERRING MD :  Dr. Jeral Fruit PRIMARY SERVICE: Neuro surgery  CHIEF COMPLAINT:  Medical management  BRIEF PATIENT DESCRIPTION: 57 year old female with an extensive medical history who underwent a lumbar surgery subsequently suffered CSF leak.  Patient was taken back to the OR for exploration and is to be kept at 5 degree angle inorder to avoid further leak.  PCCM was consulted to address concern for PE/DVT prophylaxis as well as general medical management.  SIGNIFICANT EVENTS / STUDIES:  11/26 Decompression and fusion at l45, l5 S1. 11/27 re-exploration of surgical site for concern for CSF leak.  LINES / TUBES: PIV  CULTURES: None  ANTIBIOTICS: None  PAST MEDICAL HISTORY :  Past Medical History  Diagnosis Date  . PONV (postoperative nausea and vomiting)   . Hypertension     takes Losartan daily  . Cough   . Sinus drainage   . Pneumonia     hx of 15+yrs ago  . Chronic back pain     ddd/lumbago,and radiculopathy  . Arthritis   . Gout     hx of  . GERD (gastroesophageal reflux disease)     takes Protonix daily  . H/O hiatal hernia   . Gastric ulcer   . Ulcerative colitis   . IBS (irritable bowel syndrome)   . Constipation   . Diarrhea   . Hx of colonic polyps   . Urinary incontinence   . Nocturia   . Hypothyroidism     takes Levothyroxine daily  . Depression   . Anxiety     takes celexa daily   Past Surgical History  Procedure Laterality Date  . Cysts removed from ovary    . Cholecystectomy    . Dilation and curettage of uterus    . Breast surgery      left breast lumpectomy  . Breast biopsy      right  . Colonoscopy    . Esophagogastroduodenoscopy    . Lumbar laminectomy/decompression microdiscectomy  12/03/2011    Procedure: LUMBAR LAMINECTOMY/DECOMPRESSION MICRODISCECTOMY 2 LEVELS;  Surgeon: Karn Cassis, MD;  Location: [facility];  Service: Neurosurgery;  Laterality: Right;  Right Lumbar four-five Lumbar five sacral one Foraminotomies   Prior to Admission medications   Medication Sig Start Date End Date Taking? Authorizing Provider  citalopram (CELEXA) 20 MG tablet Take 20 mg by mouth daily.   Yes Historical Provider, MD  diazepam (VALIUM) 5 MG tablet Take 5 mg by mouth every 8 (eight) hours as needed for muscle spasms.   Yes Historical Provider, MD  HYDROcodone-acetaminophen (NORCO) 10-325 MG per tablet Take 1 tablet by mouth every 6 (six) hours as needed (for pain).   Yes Historical Provider, MD  hyoscyamine (LEVSIN, ANASPAZ) 0.125 MG tablet Take 0.125 mg by mouth 3 (three) times daily.   Yes Historical Provider, MD  levothyroxine (SYNTHROID, LEVOTHROID) 150 MCG tablet Take 150 mcg by mouth daily.   Yes Historical Provider, MD  losartan-hydrochlorothiazide (HYZAAR) 50-12.5 MG per tablet Take 1 tablet by mouth daily.   Yes Historical Provider, MD  pantoprazole (PROTONIX) 40 MG tablet Take 40 mg by mouth daily.   Yes Historical Provider, MD  albuterol (PROVENTIL HFA;VENTOLIN HFA) 108 (90 BASE) MCG/ACT inhaler Inhale 2 puffs into the lungs 2 (two) times daily.    Historical Provider, MD   Allergies  Allergen  Reactions  . Codeine Nausea Only    FAMILY HISTORY:  Family History  Problem Relation Age of Onset  . Anesthesia problems Sister   . Hypotension Neg Hx   . Malignant hyperthermia Neg Hx   . Pseudochol deficiency Neg Hx   . Anesthesia problems Mother    SOCIAL HISTORY:  reports that she has never smoked. She has never used smokeless tobacco. She reports that she drinks alcohol. She reports that she does not use illicit drugs.  REVIEW OF SYSTEMS:  Unattainable patient is very uncomfortable and not willing to provide history.  SUBJECTIVE: C/O pain at surgical site.  VITAL SIGNS: Temp:  [97.6 F (36.4 C)-99 F (37.2 C)] 98 F (36.7 C) (11/27 1300) Pulse Rate:   [58-107] 102 (11/27 1300) Resp:  [5-23] 11 (11/27 1300) BP: (62-116)/(37-98) 112/85 mmHg (11/27 1300) SpO2:  [96 %-100 %] 96 % (11/27 1300) Weight:  [113.8 kg (250 lb 14.1 oz)] 113.8 kg (250 lb 14.1 oz) (11/27 0100) HEMODYNAMICS:   VENTILATOR SETTINGS:   INTAKE / OUTPUT: Intake/Output     11/26 0701 - 11/27 0700 11/27 0701 - 11/28 0700   I.V. (mL/kg) 3825 (33.6) 625 (5.5)   Blood 690 162.5   Other 180 200   IV Piggyback 550    Total Intake(mL/kg) 5245 (46.1) 987.5 (8.7)   Urine (mL/kg/hr) 290    Drains 110 135 (0.2)   Blood 1700    Total Output 2100 135   Net +3145 +852.5        Urine Occurrence 125 x      PHYSICAL EXAMINATION: General:  Mild painful distress. Neuro:  Alert and interactive, follows command, weak bilateral lower ext. HEENT:  [place]/AT, PERRL, EOM-I and MMM. Cardiovascular:  RRR, Nl S1/S2, -M/R/G. Lungs:  Decreased BS diffusely. Abdomen:  Obese, soft, ND, ND and +BS. Musculoskeletal:  Bilateral lower ext weakness 3/5, -edema and -tenderness. Skin:  Intact, wound clean.  LABS:  CBC  Recent Labs Lab 08/26/13 0810  WBC 10.1  HGB 7.5*  HCT 21.5*  PLT 138*   Coag's No results found for this basename: APTT, INR,  in the last 168 hours BMET No results found for this basename: NA, K, CL, CO2, BUN, CREATININE, GLUCOSE,  in the last 168 hours Electrolytes No results found for this basename: CALCIUM, MG, PHOS,  in the last 168 hours Sepsis Markers No results found for this basename: LATICACIDVEN, PROCALCITON, O2SATVEN,  in the last 168 hours ABG No results found for this basename: PHART, PCO2ART, PO2ART,  in the last 168 hours Liver Enzymes No results found for this basename: AST, ALT, ALKPHOS, BILITOT, ALBUMIN,  in the last 168 hours Cardiac Enzymes No results found for this basename: TROPONINI, PROBNP,  in the last 168 hours Glucose No results found for this basename: GLUCAP,  in the last 168 hours  Imaging Dg Lumbar Spine 2-3 Views  08/25/2013    CLINICAL DATA:  L4-5 and L5-S1 PLIF.  EXAM: LUMBAR SPINE - 2-3 VIEW; DG C-ARM 1-60 MIN  COMPARISON:  Radiographs same date.  Lumbar myelogram CT 05/11/2013.  FINDINGS: PA and lateral spot fluoroscopic images of the lower lumbar spine demonstrate the placement of bilateral pedicle screws at L4, L5 and S1. The interconnecting rods have not yet been placed. Interbody spacers at both levels appear well positioned. Surgical sponge is present within the operative bed.  IMPRESSION: Intraoperative views during lower lumbar fusion as described.   Electronically Signed   By: Sandi Mariscal.D.  On: 08/25/2013 15:59   Dg Lumbar Spine 1 View  08/25/2013   CLINICAL DATA:  Lumbar fusion.  EXAM: LUMBAR SPINE - 1 VIEW  COMPARISON:  Lumbar spine radiographs 08/13/2012  FINDINGS: Single lateral intraoperative image of the lower lumbar spine demonstrates the tips of 2 surgical instruments projecting posterior to the L4-5 and L5-S1 disc spaces. Soft tissue retractors are noted posteriorly. Vertebral alignment is normal.  IMPRESSION: Intraoperative image during lumbar spine surgery as above.  These results were called by telephone at the time of interpretation on 08/25/2013 at 12:35 PM to Dr. Hilda Lias , who verbally acknowledged these results.   Electronically Signed   By: Sebastian Ache   On: 08/25/2013 12:35   Dg Spine Portable 1 View  08/25/2013   CLINICAL DATA:  Lumbar fusion.  EXAM: PORTABLE SPINE - 1 VIEW  COMPARISON:  Intraoperative lumbar film earlier the same day  FINDINGS: A single lateral intraoperative image of the lower lumbar spine is provided. There is poor penetration, particularly in the region of the lumbosacral junction, making determination of level numbering difficult. When comparing other landmarks, the surgical instrument tip appears to project over the L5-S1 disc space. Tissue retractors are again noted posteriorly.  IMPRESSION: Poorly penetrated single intraoperative image during lumbar fusion.  Surgical instrument appears to project over L5-S1 disc space.  These results were called by telephone at the time of interpretation on 08/25/2013 at 1:22 PM to Dr. Hilda Lias , who verbally acknowledged these results.   Electronically Signed   By: Sebastian Ache   On: 08/25/2013 13:22   Dg C-arm 1-60 Min  08/25/2013   CLINICAL DATA:  L4-5 and L5-S1 PLIF.  EXAM: LUMBAR SPINE - 2-3 VIEW; DG C-ARM 1-60 MIN  COMPARISON:  Radiographs same date.  Lumbar myelogram CT 05/11/2013.  FINDINGS: PA and lateral spot fluoroscopic images of the lower lumbar spine demonstrate the placement of bilateral pedicle screws at L4, L5 and S1. The interconnecting rods have not yet been placed. Interbody spacers at both levels appear well positioned. Surgical sponge is present within the operative bed.  IMPRESSION: Intraoperative views during lower lumbar fusion as described.   Electronically Signed   By: Roxy Horseman M.D.   On: 08/25/2013 15:59     CXR: None  ASSESSMENT / PLAN:  PULMONARY A: Hypoxemia, likely OSA. P:   - Titrate O2 for sat of 88-92%. - Will likely need a sleep study as outpatient. - To avoid DVT/PE will start heparin SQ and place SCD's. - CXR when patient is able to sit upright, will hold off for now due to CSF leak. - IS per RT protocol.  CARDIOVASCULAR A: Tachycardia, sinus.  Due to pain.  HTN history. P:  - Pain control. - Monitor. - Hold anti-HTN (on ARB and HCTZ at home).  RENAL A:  No active issues.  No BMET. P:   - BMET now.  GASTROINTESTINAL A:  No active issues. P:   - NPO for now. - Monitor.  HEMATOLOGIC A:  No active issues. P:  - CBC now.  INFECTIOUS A:  No active issues. P:   - Monitor WBC and fever curve.  ENDOCRINE A:  Hypothyroidism by history.  On steroids. P:   - Check TSH. - Continue home thyroxin 75 mg IV daily.  NEUROLOGIC A:  Post op. P:   - Per NS. - Pain management.  I have personally obtained a history, examined the patient, evaluated  laboratory and imaging results, formulated the assessment  and plan and placed orders.  Alyson Reedy, M.D. Pulmonary and Critical Care Medicine [facility] Pager: [phone]  08/26/2013, 1:36 PM

## 2013-08-26 NOTE — Anesthesia Postprocedure Evaluation (Signed)
Anesthesia Post Note  Patient: Sherry Wong  Procedure(s) Performed: Procedure(s) (LRB): EVACUATION OF LUMBAR HEMATOMA (N/A)  Anesthesia type: general  Patient location: PACU  Post pain: Pain level controlled  Post assessment: Patient's Cardiovascular Status Stable  Last Vitals:  Filed Vitals:   08/26/13 0100  BP: 116/78  Pulse: 81  Temp:   Resp: 13    Post vital signs: Reviewed and stable  Level of consciousness: sedated  Complications: No apparent anesthesia complications

## 2013-08-26 NOTE — Progress Notes (Signed)
Patient ID: Sherry Wong, female   DOB: 1956-09-10, 56 y.o.   MRN: [id number] i did talk with CCM. They will help [place] with her care

## 2013-08-26 NOTE — Op Note (Signed)
Sherry Wong, PUCCINI NO.:  [id number]  MEDICAL RECORD NO.:  [id number]  LOCATION:  3M12C                        FACILITY:  [facility]  PHYSICIAN:  Hilda Lias, M.D.   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  Status post diskectomy, L4-5, L5-S1.  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  Status post diskectomy, L4-5, L5-S1.  PROCEDURE:  L4-L5 laminectomy and facetectomy.  Left L4-L5, L5-S1 diskectomy, _insertion_________ of a single cage from the left side at each level with a BMP and autograft.  Decompression of the thecal sac.  Foraminotomy, lysis of adhesion bilaterally.  Pedicle screws at L4, L5, S1.  Posterolateral arthrodesis with autograft, BMP, and Vitoss.  Cell Saver, C-arm.  SURGEON:  Hilda Lias, MD  ASSISTANT:  Dr. Rolanda Wong.  CLINICAL HISTORY:  Sherry Wong is a lady, who in the past underwent surgery of L4-L5, L5-S1 right side.  Nevertheless, she is not any better.  She is complaining of back pain worsen to both legs.  The right is worse than a left one.  She had failed with conservative treatment including Pain Clinic.  X-ray showed facet arthropathy and degenerative disk disease worse at the L5-1, but also at the L4-5.  The patient want to proceed with surgery because the pain was getting worse.  Since she had been at bedrest to alleviate the pain, she had gained a considerable amount of weight.  The patient and her husband knew the risk of the surgery including the possibility of CSF leak, infection, hematoma, need of further surgery, and no improvement whatsoever.  DETAILS OF PROCEDURE:  The patient was taken to the OR, and  after intubation, she was positioned in a prone manner.  The back was cleaned with Betadine and DuraPrep.  Midline incision resecting the previously scar was made.  It was difficult because of her size, feel any bones, so we followed up through the subcutaneous space, through a thick adipose tissue until we were able to reach the spine.  X-rays showed that we were at the L4-5 and L5-S1.  From then on with the help of the [place] as well as the drill, we removed the spinal process of L4-5 as well as the lamina and the facet.  In the left side, the dissection was carried down very easily except because she has quite a bit of large epidural veins.  In the right side, it was quite difficult.  The patient has quite a bit of adhesion, mostly compromising the takeoff of L4 and L5- S1.  Lysis was accomplished.  We tried to get into the disk space, but it was difficult to do any medial retraction.  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  Because of that, we went ahead and proceed with the diskectomy from the left side at L4-5 and L5-S1.  After diskectomy was accomplished, the endplate were removed.  Then a  banana-type of cage of  12 was inserted at the level of L4-5 and at about 8 at the level of L5-S1. X-ray showed good position of the cages.  Then, using the C-arm in AP view and then a lateral view, we probed the pedicle of L4, L5, and S1. We were able to introduce 6 screws of 5.5 x 45 at the L4-5 area and 5.5 x 55 at the level of S1.  Prior to introducing the screws, we were able to feel the hole just to be sure that we were surrounded by bone.  The screw was connected with a rod and Capps.  Then, we went laterally and we removed the periosteum of the facet of L4-5, L5-S1 and a mix of Vitoss, BMP, and autograft.  We proceeded with arthrodesis.  We brought the microscope into the area.  We accomplished more lysis on the right side until we had plenty of space for the nerve root.  Although  Valsalva maneuver was negative at 40, I proceeded with using surgical glue__________.  Then, the area was irrigated.  The wound was closed with different layer of Vicryl and the skin with Steri-Strips.  The patient is going to go to the PACU.          ______________________________ Hilda Lias, M.D.     EB/MEDQ  D:  08/25/2013  T:  08/26/2013  Job:  [id number]

## 2013-08-26 NOTE — Progress Notes (Signed)
Second op note [phone]

## 2013-08-26 NOTE — Progress Notes (Signed)
Patient ID: Sherry Wong, female   DOB: 10-14-1955, 57 y.o.   MRN: [id number] Stable, awake, c/o incisional pain. Neuro unchanged. Barely some movement in feet. Flexes both knees weakly but not worse than prior to exploration. Foley in place. Distal numbness both l5 s1 dermatomes. hemovac draining well. No headache. Getting 2 units of rbc. Spoke at length with her, her sister and husband.they are aware of the need for bed rest to prevent csf leak.

## 2013-08-26 NOTE — Progress Notes (Signed)
OT Cancellation Note  Patient Details Name: ZAILEE VALLELY MRN: [id number] DOB: May 28, 1956   Cancelled Treatment:    Reason Eval/Treat Not Completed: Patient not medically ready--pt on bedrest until Sunday AM.  Evette Georges [phone] 08/26/2013, 1:57 PM

## 2013-08-26 NOTE — Op Note (Signed)
NAMESHASTA, CHINN NO.:  [id number]  MEDICAL RECORD NO.:  [id number]  LOCATION:  3M12C                        FACILITY:  [facility]  PHYSICIAN:  Hilda Lias, M.D.   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  PROCEDURE:  Exploration of the lumbar wound.  Evacuation of epidural hematoma.  SURGEON:  Hilda Lias, MD  CLINICAL HISTORY:  Ms. Dresden is a lady who today had fusion at the level of L4-L5 and L5-S1.  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 Decompression and fusion was done.  Because of the adhesions, we were able to introduce only cages from the left side with the pedicle screws. We had some arachnoid pouch on the right side and surgical glue was used.  Because of that, I decided not to use drain.  Nevertheless, the patient woke up and she had distal weakness of both feet.  I did rectal examination which was normal.  The patient had a Foley catheter.  I did kept her in pacu, and I proceeded with another surgery, and after 3 hours I rechecked her.  Although she improved with little bit more movement; nevertheless, she had dense weakness of both feet.  Because of that, I decided to proceed with exploration of the lumbar wound.  PROCEDURE:  The patient was taken to the OR and after intubation, the wound was cleaned with DuraPrep and drapes were applied.  A midline incision removing the previous stitches from the skin all the way down to the fascia was done.  We found that there was a mix of epidural hematoma with Gelfoam, which were left during the previous surgical procedure.  Evacuation of the hematoma was accomplished.  I went into the foramen  and they were completely open.  The cage was in normal position, and the pedicle screws were not violating the medial wall. The area was irrigated with copious amount of saline solution.  I did the Valsalva maneuver up to 40  __________ and there was no evidence of any CSF leak, although there was a surgical glue in the area around right L4-L5 and L5-S1.  Nevertheless, after that I went ahead and left a large Hemovac in the epidural space, and the wound was closed with Vicryl and nylon.  The patient was transferred to the recovery room. She is going to go eventually to the neurosurgical intensive care unit.          ______________________________ Hilda Lias, M.D.    EB/MEDQ  D:  08/26/2013  T:  08/26/2013  Job:  [id number]

## 2013-08-26 NOTE — Progress Notes (Signed)
Pt has very slight movements in toes after hematoma evacuation.

## 2013-08-26 NOTE — Progress Notes (Signed)
Patient ID: Sherry Wong, female   DOB: [birth year], 57 y.o.   MRN: [id number] Subjective: Patient reports inability to move feet  Objective: Vital signs in last 24 hours: Temp:  [97.6 F (36.4 C)-99 F (37.2 C)] 99 F (37.2 C) (11/27 0822) Pulse Rate:  [58-102] 97 (11/27 0700) Resp:  [5-23] 14 (11/27 0700) BP: (62-116)/(37-89) 108/71 mmHg (11/27 0700) SpO2:  [97 %-100 %] 99 % (11/27 0700) Weight:  [113.8 kg (250 lb 14.1 oz)] 113.8 kg (250 lb 14.1 oz) (11/27 0100)  Intake/Output from previous day: 11/26 0701 - 11/27 0700 In: 5245 [I.V.:3825; Blood:690; IV Piggyback:550] Out: 2100 [Urine:290; Drains:110; Blood:1700] Intake/Output this shift: Total I/O In: 100 [Other:100] Out: 35 [Drains:35]  awake, alert; no df/pf bilaterally; can flex knees weakly; sensation out distally on both lower extremities;   Lab Results:  Recent Labs  08/26/13 0810  WBC 10.1  HGB 7.5*  HCT 21.5*  PLT 138*   BMET No results found for this basename: NA, K, CL, CO2, GLUCOSE, BUN, CREATININE, CALCIUM,  in the last 72 hours  Studies/Results: Dg Lumbar Spine 2-3 Views  08/25/2013   CLINICAL DATA:  L4-5 and L5-S1 PLIF.  EXAM: LUMBAR SPINE - 2-3 VIEW; DG C-ARM 1-60 MIN  COMPARISON:  Radiographs same date.  Lumbar myelogram CT 05/11/2013.  FINDINGS: PA and lateral spot fluoroscopic images of the lower lumbar spine demonstrate the placement of bilateral pedicle screws at L4, L5 and S1. The interconnecting rods have not yet been placed. Interbody spacers at both levels appear well positioned. Surgical sponge is present within the operative bed.  IMPRESSION: Intraoperative views during lower lumbar fusion as described.   Electronically Signed   By: Roxy Horseman M.D.   On: 08/25/2013 15:59   Dg Lumbar Spine 1 View  08/25/2013   CLINICAL DATA:  Lumbar fusion.  EXAM: LUMBAR SPINE - 1 VIEW  COMPARISON:  Lumbar spine radiographs 08/13/2012  FINDINGS: Single lateral intraoperative image of the lower lumbar spine  demonstrates the tips of 2 surgical instruments projecting posterior to the L4-5 and L5-S1 disc spaces. Soft tissue retractors are noted posteriorly. Vertebral alignment is normal.  IMPRESSION: Intraoperative image during lumbar spine surgery as above.  These results were called by telephone at the time of interpretation on 08/25/2013 at 12:35 PM to Dr. Hilda Lias , who verbally acknowledged these results.   Electronically Signed   By: Sebastian Ache   On: 08/25/2013 12:35   Dg Spine Portable 1 View  08/25/2013   CLINICAL DATA:  Lumbar fusion.  EXAM: PORTABLE SPINE - 1 VIEW  COMPARISON:  Intraoperative lumbar film earlier the same day  FINDINGS: A single lateral intraoperative image of the lower lumbar spine is provided. There is poor penetration, particularly in the region of the lumbosacral junction, making determination of level numbering difficult. When comparing other landmarks, the surgical instrument tip appears to project over the L5-S1 disc space. Tissue retractors are again noted posteriorly.  IMPRESSION: Poorly penetrated single intraoperative image during lumbar fusion. Surgical instrument appears to project over L5-S1 disc space.  These results were called by telephone at the time of interpretation on 08/25/2013 at 1:22 PM to Dr. Hilda Lias , who verbally acknowledged these results.   Electronically Signed   By: Sebastian Ache   On: 08/25/2013 13:22   Dg C-arm 1-60 Min  08/25/2013   CLINICAL DATA:  L4-5 and L5-S1 PLIF.  EXAM: LUMBAR SPINE - 2-3 VIEW; DG C-ARM 1-60 MIN  COMPARISON:  Radiographs same  date.  Lumbar myelogram CT 05/11/2013.  FINDINGS: PA and lateral spot fluoroscopic images of the lower lumbar spine demonstrate the placement of bilateral pedicle screws at L4, L5 and S1. The interconnecting rods have not yet been placed. Interbody spacers at both levels appear well positioned. Surgical sponge is present within the operative bed.  IMPRESSION: Intraoperative views during lower  lumbar fusion as described.   Electronically Signed   By: Roxy Horseman M.D.   On: 08/25/2013 15:59    Assessment/Plan: Severe cauda equina deficit; HCT is 21; she needs oxygen carrying capacity with this neurologic insult, so will transfuse 2 units; hopefully in time she will start to get some neurologic return   LOS: 1 day  as above   Reinaldo Meeker, MD 08/26/2013, 10:02 AM

## 2013-08-26 NOTE — Progress Notes (Signed)
Awake, c/o incisional pain. No headache. Able to flex both legs at the kness. Dense weakness of plant and dorsi flexion of feet. Sensory absent at l5-S1 dermatomes. hemovac working well. HOB down to 5 degrees. Cbc, c-met at 8 am

## 2013-08-26 NOTE — Transfer of Care (Signed)
Immediate Anesthesia Transfer of Care Note  Patient: Sherry Wong  Procedure(s) Performed: Procedure(s): EVACUATION OF LUMBAR HEMATOMA (N/A)  Patient Location: PACU  Anesthesia Type:General  Level of Consciousness: oriented, sedated, patient cooperative and responds to stimulation  Airway & Oxygen Therapy: Patient Spontanous Breathing and Patient connected to nasal cannula oxygen  Post-op Assessment: Report given to PACU RN, Post -op Vital signs reviewed and stable and Patient moving all extremities X 4  Post vital signs: Reviewed and stable  Complications: No apparent anesthesia complications

## 2013-08-27 DIAGNOSIS — E039 Hypothyroidism, unspecified: Secondary | ICD-10-CM

## 2013-08-27 DIAGNOSIS — R0902 Hypoxemia: Secondary | ICD-10-CM

## 2013-08-27 LAB — BASIC METABOLIC PANEL
BUN: 13 mg/dL (ref 6–23)
CO2: 29 mEq/L (ref 19–32)
Calcium: 8.5 mg/dL (ref 8.4–10.5)
Chloride: 107 mEq/L (ref 96–112)
Creatinine, Ser: 0.64 mg/dL (ref 0.50–1.10)
GFR calc Af Amer: >90 mL/min (ref >90)
GFR calc non Af Amer: >90 mL/min (ref >90)
Glucose, Bld: 142 mg/dL — ABNORMAL HIGH (ref 70–99)
Potassium: 4.3 mEq/L (ref 3.5–5.1)
Sodium: 141 mEq/L (ref 135–145)

## 2013-08-27 LAB — COMPREHENSIVE METABOLIC PANEL
ALT: 21 U/L (ref 0–35)
AST: 32 U/L (ref 0–37)
Albumin: 3 g/dL — ABNORMAL LOW (ref 3.5–5.2)
Alkaline Phosphatase: 52 U/L (ref 39–117)
BUN: 14 mg/dL (ref 6–23)
CO2: 26 mEq/L (ref 19–32)
Calcium: 8.3 mg/dL — ABNORMAL LOW (ref 8.4–10.5)
Chloride: 107 mEq/L (ref 96–112)
Creatinine, Ser: 0.64 mg/dL (ref 0.50–1.10)
GFR calc Af Amer: >90 mL/min (ref >90)
GFR calc non Af Amer: >90 mL/min (ref >90)
Glucose, Bld: 139 mg/dL — ABNORMAL HIGH (ref 70–99)
Potassium: 4.2 mEq/L (ref 3.5–5.1)
Sodium: 142 mEq/L (ref 135–145)
Total Bilirubin: 0.4 mg/dL (ref 0.3–1.2)
Total Protein: 5.3 g/dL — ABNORMAL LOW (ref 6.0–8.3)

## 2013-08-27 LAB — CBC
HCT: 27.2 % — ABNORMAL LOW (ref 36.0–46.0)
Hemoglobin: 9.4 g/dL — ABNORMAL LOW (ref 12.0–15.0)
MCH: 30.7 pg (ref 26.0–34.0)
MCHC: 34.6 g/dL (ref 30.0–36.0)
MCV: 88.9 fL (ref 78.0–100.0)
Platelets: 147 10*3/uL — ABNORMAL LOW (ref 150–400)
RBC: 3.06 MIL/uL — ABNORMAL LOW (ref 3.87–5.11)
RDW: 15.7 % — ABNORMAL HIGH (ref 11.5–15.5)
WBC: 11.6 10*3/uL — ABNORMAL HIGH (ref 4.0–10.5)

## 2013-08-27 LAB — TYPE AND SCREEN
ABO/RH(D): A POS
Antibody Screen: NEGATIVE
Unit division: 0
Unit division: 0

## 2013-08-27 LAB — PHOSPHORUS: Phosphorus: 2.5 mg/dL (ref 2.3–4.6)

## 2013-08-27 LAB — MAGNESIUM: Magnesium: 2.1 mg/dL (ref 1.5–2.5)

## 2013-08-27 MED ORDER — TAMSULOSIN HCL 0.4 MG PO CAPS
0.8000 mg | ORAL_CAPSULE | Freq: Every day | ORAL | Status: DC
  Administered 2013-08-27 – 2013-09-08 (×13): 0.8 mg via ORAL
  Filled 2013-08-27 (×13): qty 2

## 2013-08-27 MED ORDER — ALBUTEROL SULFATE (5 MG/ML) 0.5% IN NEBU
2.5000 mg | INHALATION_SOLUTION | RESPIRATORY_TRACT | Status: DC | PRN
  Administered 2013-08-27 – 2013-08-28 (×2): 2.5 mg via RESPIRATORY_TRACT
  Filled 2013-08-27 (×3): qty 0.5

## 2013-08-27 MED ORDER — HYDRALAZINE HCL 20 MG/ML IJ SOLN: 10.0000 mg | INTRAMUSCULAR | Status: DC | PRN

## 2013-08-27 MED ORDER — CITALOPRAM HYDROBROMIDE 20 MG PO TABS
20.0000 mg | ORAL_TABLET | Freq: Every day | ORAL | Status: DC
  Administered 2013-08-27 – 2013-09-08 (×13): 20 mg via ORAL
  Filled 2013-08-27 (×13): qty 1

## 2013-08-27 MED ORDER — DEXAMETHASONE SODIUM PHOSPHATE 10 MG/ML IJ SOLN
8.0000 mg | Freq: Four times a day (QID) | INTRAMUSCULAR | Status: DC
  Administered 2013-08-27 – 2013-08-31 (×17): 8 mg via INTRAVENOUS
  Filled 2013-08-27 (×2): qty 0.8
  Filled 2013-08-27: qty 1
  Filled 2013-08-27 (×3): qty 0.8
  Filled 2013-08-27: qty 1
  Filled 2013-08-27 (×11): qty 0.8

## 2013-08-27 MED ORDER — LEVOTHYROXINE SODIUM 150 MCG PO TABS
150.0000 ug | ORAL_TABLET | Freq: Every day | ORAL | Status: DC
  Administered 2013-08-27 – 2013-09-08 (×13): 150 ug via ORAL
  Filled 2013-08-27 (×14): qty 1

## 2013-08-27 MED FILL — Sodium Chloride IV Soln 0.9%: INTRAVENOUS | Qty: 1000 | Status: AC

## 2013-08-27 MED FILL — Sodium Chloride Irrigation Soln 0.9%: Qty: 3000 | Status: AC

## 2013-08-27 MED FILL — Heparin Sodium (Porcine) Inj 1000 Unit/ML: INTRAMUSCULAR | Qty: 30 | Status: AC

## 2013-08-27 NOTE — Progress Notes (Signed)
Patient ID: Sherry Wong, female   DOB: 21-Jul-1956, 57 y.o.   MRN: [id number] Spoke with husband

## 2013-08-27 NOTE — Progress Notes (Signed)
Patient ID: Sherry Wong, female   DOB: 14-Sep-1956, 57 y.o.   MRN: [id number]   hemovac working well. Some headache.able to flex knees. Some movement in toes, right more than left. Sensation she can feel more in both feet than yesterday. Rectal tone is present and she is able to feel. Clinically she is a little better than yesterday. i was going to get a lumbar mri today is she was with no improvement. i will hold it in the meantime and avoid the chance of headache and the pulling of the drain. CCM is helping [place]

## 2013-08-27 NOTE — Progress Notes (Signed)
PT Cancellation Note  Patient Details Name: Sherry Wong MRN: [id number] DOB: 03-26-1956   Cancelled Treatment:    Reason Eval/Treat Not Completed: Patient not medically ready (Pt continues to be on bedrest until Sunday AM.)   Wilfred Siverson 08/27/2013, 9:56 AM  Jake Shark, PT DPT [phone]

## 2013-08-27 NOTE — Progress Notes (Signed)
Patient ID: SCOTT FIX, female   DOB: 1956/05/04, 57 y.o.   MRN: [id number] Subjective: Patient reports no real change  Objective: Vital signs in last 24 hours: Temp:  [97.6 F (36.4 C)-98.7 F (37.1 C)] 98.5 F (36.9 C) (11/28 0800) Pulse Rate:  [80-107] 82 (11/28 0900) Resp:  [5-20] 11 (11/28 0900) BP: (93-131)/(53-100) 99/68 mmHg (11/28 0900) SpO2:  [95 %-100 %] 100 % (11/28 0900)  Intake/Output from previous day: 11/27 0701 - 11/28 0700 In: 3890.5 [I.V.:2053; Blood:662.5; IV Piggyback:50] Out: 975 [Urine:485; Drains:490] Intake/Output this shift: Total I/O In: 150 [I.V.:150] Out: 130 [Urine:100; Drains:30]  some flickering of movement on distal lower extremities; sensation without change  Lab Results:  Recent Labs  08/26/13 1830 08/27/13 0400  WBC 11.9* 11.6*  HGB 10.0* 9.4*  HCT 28.1* 27.2*  PLT 137* 147*   BMET  Recent Labs  08/27/13 0400 08/27/13 0750  NA 141 142  K 4.3 4.2  CL 107 107  CO2 29 26  GLUCOSE 142* 139*  BUN 13 14  CREATININE 0.64 0.64  CALCIUM 8.5 8.3*    Studies/Results: Dg Lumbar Spine 2-3 Views  08/25/2013   CLINICAL DATA:  L4-5 and L5-S1 PLIF.  EXAM: LUMBAR SPINE - 2-3 VIEW; DG C-ARM 1-60 MIN  COMPARISON:  Radiographs same date.  Lumbar myelogram CT 05/11/2013.  FINDINGS: PA and lateral spot fluoroscopic images of the lower lumbar spine demonstrate the placement of bilateral pedicle screws at L4, L5 and S1. The interconnecting rods have not yet been placed. Interbody spacers at both levels appear well positioned. Surgical sponge is present within the operative bed.  IMPRESSION: Intraoperative views during lower lumbar fusion as described.   Electronically Signed   By: Roxy Horseman M.D.   On: 08/25/2013 15:59   Dg Lumbar Spine 1 View  08/25/2013   CLINICAL DATA:  Lumbar fusion.  EXAM: LUMBAR SPINE - 1 VIEW  COMPARISON:  Lumbar spine radiographs 08/13/2012  FINDINGS: Single lateral intraoperative image of the lower lumbar spine  demonstrates the tips of 2 surgical instruments projecting posterior to the L4-5 and L5-S1 disc spaces. Soft tissue retractors are noted posteriorly. Vertebral alignment is normal.  IMPRESSION: Intraoperative image during lumbar spine surgery as above.  These results were called by telephone at the time of interpretation on 08/25/2013 at 12:35 PM to Dr. Hilda Lias , who verbally acknowledged these results.   Electronically Signed   By: Sebastian Ache   On: 08/25/2013 12:35   Dg Spine Portable 1 View  08/25/2013   CLINICAL DATA:  Lumbar fusion.  EXAM: PORTABLE SPINE - 1 VIEW  COMPARISON:  Intraoperative lumbar film earlier the same day  FINDINGS: A single lateral intraoperative image of the lower lumbar spine is provided. There is poor penetration, particularly in the region of the lumbosacral junction, making determination of level numbering difficult. When comparing other landmarks, the surgical instrument tip appears to project over the L5-S1 disc space. Tissue retractors are again noted posteriorly.  IMPRESSION: Poorly penetrated single intraoperative image during lumbar fusion. Surgical instrument appears to project over L5-S1 disc space.  These results were called by telephone at the time of interpretation on 08/25/2013 at 1:22 PM to Dr. Hilda Lias , who verbally acknowledged these results.   Electronically Signed   By: Sebastian Ache   On: 08/25/2013 13:22   Dg C-arm 1-60 Min  08/25/2013   CLINICAL DATA:  L4-5 and L5-S1 PLIF.  EXAM: LUMBAR SPINE - 2-3 VIEW; DG C-ARM 1-60 MIN  COMPARISON:  Radiographs same date.  Lumbar myelogram CT 05/11/2013.  FINDINGS: PA and lateral spot fluoroscopic images of the lower lumbar spine demonstrate the placement of bilateral pedicle screws at L4, L5 and S1. The interconnecting rods have not yet been placed. Interbody spacers at both levels appear well positioned. Surgical sponge is present within the operative bed.  IMPRESSION: Intraoperative views during lower  lumbar fusion as described.   Electronically Signed   By: Roxy Horseman M.D.   On: 08/25/2013 15:59    Assessment/Plan: Some improvement pod 2 which is an excellent sign. Will continue present rx.   LOS: 2 days  as above   Reinaldo Meeker, MD 08/27/2013, 9:33 AM

## 2013-08-27 NOTE — Progress Notes (Signed)
UR completed.  Valrie Jia, RN BSN [facility] CCM Trauma/Neuro ICU Case Manager [phone]  

## 2013-08-27 NOTE — Progress Notes (Signed)
PULMONARY  / CRITICAL CARE MEDICINE  Name: Sherry Wong MRN: [id number] DOB: July 08, 1956    ADMISSION DATE:  08/25/2013 CONSULTATION DATE:  08/26/2013  REFERRING MD :  Dr. Jeral Fruit PRIMARY SERVICE: Neuro surgery  CHIEF COMPLAINT:  Medical management  BRIEF PATIENT DESCRIPTION: 57 year old female with hypothyroidism, asthma, GERD  who underwent a lumbar surgery subsequently suffered CSF leak.  Patient was taken back to the OR for exploration and is to be kept at 5 degree angle inorder to avoid further leak.  PCCM was consulted to address concern for PE/DVT prophylaxis as well as general medical management.  SIGNIFICANT EVENTS / STUDIES:  11/26 Decompression and fusion at l45, l5 S1. 11/27 re-exploration of surgical site for concern for CSF leak.  LINES / TUBES: PIV  CULTURES: None  ANTIBIOTICS: None  SUBJECTIVE: C/O pain at surgical site. C/o wheezing  VITAL SIGNS: Temp:  [97.6 F (36.4 C)-98.7 F (37.1 C)] 98.5 F (36.9 C) (11/28 0800) Pulse Rate:  [80-107] 82 (11/28 0900) Resp:  [5-18] 11 (11/28 0900) BP: (93-131)/(53-100) 99/68 mmHg (11/28 0900) SpO2:  [95 %-100 %] 100 % (11/28 0900) HEMODYNAMICS:   VENTILATOR SETTINGS:   INTAKE / OUTPUT: Intake/Output     11/27 0701 - 11/28 0700 11/28 0701 - 11/29 0700   I.V. (mL/kg) 2053 (18) 150 (1.3)   Blood 662.5    Other 1125    IV Piggyback 50    Total Intake(mL/kg) 3890.5 (34.2) 150 (1.3)   Urine (mL/kg/hr) 485 (0.2) 100 (0.3)   Drains 490 (0.2) 30 (0.1)   Blood     Total Output 975 130   Net +2915.5 +20          PHYSICAL EXAMINATION: General:  Mild painful distress. Neuro:  Alert and interactive, follows command, flickering of movement  Of toes HEENT:  /AT, PERRL, EOM-I and MMM. Cardiovascular:  RRR, Nl S1/S2, -M/R/G. Lungs:  Decreased BS diffusely. Abdomen:  Obese, soft, ND, ND and +BS. Musculoskeletal:  Bilateral lower ext weakness 3/5, -edema and -tenderness. Skin:  Intact, wound  clean.  LABS:  CBC  Recent Labs Lab 08/26/13 0810 08/26/13 1830 08/27/13 0400  WBC 10.1 11.9* 11.6*  HGB 7.5* 10.0* 9.4*  HCT 21.5* 28.1* 27.2*  PLT 138* 137* 147*   Coag's No results found for this basename: APTT, INR,  in the last 168 hours BMET  Recent Labs Lab 08/26/13 1830 08/27/13 0400 08/27/13 0750  NA 141 141 142  K 4.1 4.3 4.2  CL 108 107 107  CO2 26 29 26   BUN 10 13 14   CREATININE 0.66 0.64 0.64  GLUCOSE 135* 142* 139*   Electrolytes  Recent Labs Lab 08/26/13 1830 08/27/13 0400 08/27/13 0750  CALCIUM 8.5 8.5 8.3*  MG 1.9 2.1  --   PHOS 2.5 2.5  --    Sepsis Markers No results found for this basename: LATICACIDVEN, PROCALCITON, O2SATVEN,  in the last 168 hours ABG No results found for this basename: PHART, PCO2ART, PO2ART,  in the last 168 hours Liver Enzymes  Recent Labs Lab 08/27/13 0750  AST 32  ALT 21  ALKPHOS 52  BILITOT 0.4  ALBUMIN 3.0*   Cardiac Enzymes No results found for this basename: TROPONINI, PROBNP,  in the last 168 hours Glucose No results found for this basename: GLUCAP,  in the last 168 hours  Imaging Dg Lumbar Spine 2-3 Views  08/25/2013   CLINICAL DATA:  L4-5 and L5-S1 PLIF.  EXAM: LUMBAR SPINE - 2-3 VIEW; DG C-ARM  1-60 MIN  COMPARISON:  Radiographs same date.  Lumbar myelogram CT 05/11/2013.  FINDINGS: PA and lateral spot fluoroscopic images of the lower lumbar spine demonstrate the placement of bilateral pedicle screws at L4, L5 and S1. The interconnecting rods have not yet been placed. Interbody spacers at both levels appear well positioned. Surgical sponge is present within the operative bed.  IMPRESSION: Intraoperative views during lower lumbar fusion as described.   Electronically Signed   By: Roxy Horseman M.D.   On: 08/25/2013 15:59   Dg Lumbar Spine 1 View  08/25/2013   CLINICAL DATA:  Lumbar fusion.  EXAM: LUMBAR SPINE - 1 VIEW  COMPARISON:  Lumbar spine radiographs 08/13/2012  FINDINGS: Single lateral  intraoperative image of the lower lumbar spine demonstrates the tips of 2 surgical instruments projecting posterior to the L4-5 and L5-S1 disc spaces. Soft tissue retractors are noted posteriorly. Vertebral alignment is normal.  IMPRESSION: Intraoperative image during lumbar spine surgery as above.  These results were called by telephone at the time of interpretation on 08/25/2013 at 12:35 PM to Dr. Hilda Lias , who verbally acknowledged these results.   Electronically Signed   By: Sebastian Ache   On: 08/25/2013 12:35   Dg Spine Portable 1 View  08/25/2013   CLINICAL DATA:  Lumbar fusion.  EXAM: PORTABLE SPINE - 1 VIEW  COMPARISON:  Intraoperative lumbar film earlier the same day  FINDINGS: A single lateral intraoperative image of the lower lumbar spine is provided. There is poor penetration, particularly in the region of the lumbosacral junction, making determination of level numbering difficult. When comparing other landmarks, the surgical instrument tip appears to project over the L5-S1 disc space. Tissue retractors are again noted posteriorly.  IMPRESSION: Poorly penetrated single intraoperative image during lumbar fusion. Surgical instrument appears to project over L5-S1 disc space.  These results were called by telephone at the time of interpretation on 08/25/2013 at 1:22 PM to Dr. Hilda Lias , who verbally acknowledged these results.   Electronically Signed   By: Sebastian Ache   On: 08/25/2013 13:22   Dg C-arm 1-60 Min  08/25/2013   CLINICAL DATA:  L4-5 and L5-S1 PLIF.  EXAM: LUMBAR SPINE - 2-3 VIEW; DG C-ARM 1-60 MIN  COMPARISON:  Radiographs same date.  Lumbar myelogram CT 05/11/2013.  FINDINGS: PA and lateral spot fluoroscopic images of the lower lumbar spine demonstrate the placement of bilateral pedicle screws at L4, L5 and S1. The interconnecting rods have not yet been placed. Interbody spacers at both levels appear well positioned. Surgical sponge is present within the operative bed.   IMPRESSION: Intraoperative views during lower lumbar fusion as described.   Electronically Signed   By: Roxy Horseman M.D.   On: 08/25/2013 15:59     CXR: None  ASSESSMENT / PLAN:  PULMONARY A: Hypoxemia, likely OSA. Asthma P:   - Titrate O2 for sat of 88-92%.- Will likely need a sleep study as outpatient. -Albuterol nebs prn - To avoid DVT/PE will start heparin SQ and place SCD's. - CXR when patient is able to sit upright, will hold off for now due to CSF leak. - IS per RT protocol.  CARDIOVASCULAR A: Tachycardia, sinus.  Due to pain.  HTN history. P:  - Pain control. - Monitor. - Hold anti-HTN (on ARB and HCTZ at home), can resume on discharge. -use hydralazine prn  RENAL A:  Oliguria  P:   - improving UO   ENDOCRINE A:  Hypothyroidism by history.  On  steroids. P:   - Check TSH. - Continue home thyroxin  NEUROLOGIC A:  Post op. P:   - Per NS. - Pain management.  PCCM available for any questions  Cyril Mourning MD. FCCP. [facility] Pager [phone] If no response call [phone]    08/27/2013, 10:26 AM

## 2013-08-28 LAB — BASIC METABOLIC PANEL
BUN: 16 mg/dL (ref 6–23)
CO2: 29 mEq/L (ref 19–32)
Calcium: 8.3 mg/dL — ABNORMAL LOW (ref 8.4–10.5)
Chloride: 108 mEq/L (ref 96–112)
Creatinine, Ser: 0.6 mg/dL (ref 0.50–1.10)
GFR calc Af Amer: >90 mL/min (ref >90)
GFR calc non Af Amer: >90 mL/min (ref >90)
Glucose, Bld: 135 mg/dL — ABNORMAL HIGH (ref 70–99)
Potassium: 4 mEq/L (ref 3.5–5.1)
Sodium: 144 mEq/L (ref 135–145)

## 2013-08-28 LAB — CBC
HCT: 25 % — ABNORMAL LOW (ref 36.0–46.0)
Hemoglobin: 8.5 g/dL — ABNORMAL LOW (ref 12.0–15.0)
MCH: 30.5 pg (ref 26.0–34.0)
MCHC: 34 g/dL (ref 30.0–36.0)
MCV: 89.6 fL (ref 78.0–100.0)
Platelets: 133 10*3/uL — ABNORMAL LOW (ref 150–400)
RBC: 2.79 MIL/uL — ABNORMAL LOW (ref 3.87–5.11)
RDW: 14.9 % (ref 11.5–15.5)
WBC: 6.4 10*3/uL (ref 4.0–10.5)

## 2013-08-28 MED ORDER — BIOTENE DRY MOUTH MT LIQD
15.0000 mL | Freq: Two times a day (BID) | OROMUCOSAL | Status: DC
  Administered 2013-08-29 – 2013-08-30 (×3): 15 mL via OROMUCOSAL

## 2013-08-28 MED ORDER — ALBUTEROL SULFATE (5 MG/ML) 0.5% IN NEBU
2.5000 mg | INHALATION_SOLUTION | Freq: Once | RESPIRATORY_TRACT | Status: AC
  Administered 2013-08-28: 2.5 mg via RESPIRATORY_TRACT

## 2013-08-28 MED ORDER — ALBUTEROL SULFATE (5 MG/ML) 0.5% IN NEBU
2.5000 mg | INHALATION_SOLUTION | RESPIRATORY_TRACT | Status: DC | PRN
  Administered 2013-08-28 – 2013-09-03 (×8): 2.5 mg via RESPIRATORY_TRACT
  Filled 2013-08-28 (×6): qty 0.5

## 2013-08-28 MED ORDER — KETOROLAC TROMETHAMINE 15 MG/ML IJ SOLN
15.0000 mg | Freq: Four times a day (QID) | INTRAMUSCULAR | Status: AC
  Administered 2013-08-28 – 2013-08-29 (×5): 15 mg via INTRAVENOUS
  Filled 2013-08-28 (×5): qty 1

## 2013-08-28 MED ORDER — PANTOPRAZOLE SODIUM 20 MG PO TBEC
20.0000 mg | DELAYED_RELEASE_TABLET | Freq: Every day | ORAL | Status: DC
  Administered 2013-08-28 – 2013-09-08 (×12): 20 mg via ORAL
  Filled 2013-08-28 (×13): qty 1

## 2013-08-28 MED ORDER — CHLORHEXIDINE GLUCONATE 0.12 % MT SOLN
15.0000 mL | Freq: Two times a day (BID) | OROMUCOSAL | Status: DC
  Administered 2013-08-29 – 2013-08-31 (×5): 15 mL via OROMUCOSAL
  Filled 2013-08-28 (×6): qty 15

## 2013-08-28 NOTE — Progress Notes (Signed)
PULMONARY  / CRITICAL CARE MEDICINE  Name: Sherry Wong MRN: [id number] DOB: July 30, 1956    ADMISSION DATE:  08/25/2013 CONSULTATION DATE:  08/26/2013  REFERRING MD :  Dr. Jeral Fruit PRIMARY SERVICE: Neuro surgery  CHIEF COMPLAINT:  Medical management  BRIEF PATIENT DESCRIPTION: 57 year old female with hypothyroidism, asthma, GERD  who underwent a lumbar surgery subsequently suffered CSF leak.  Patient was taken back to the OR for exploration and is to be kept at 5 degree angle inorder to avoid further leak.  PCCM was consulted to address concern for PE/DVT prophylaxis as well as general medical management.  SIGNIFICANT EVENTS / STUDIES:  11/26 Decompression and fusion at l45, l5 S1. 11/27 re-exploration of surgical site for concern for CSF leak.  LINES / TUBES: PIV  CULTURES: None  ANTIBIOTICS: None  SUBJECTIVE: C/O pain at surgical site. C/o wheezing  VITAL SIGNS: Temp:  [97.4 F (36.3 C)-98.9 F (37.2 C)] 98.1 F (36.7 C) (11/29 1200) Pulse Rate:  [55-107] 68 (11/29 1100) Resp:  [6-20] 6 (11/29 1100) BP: (97-132)/(49-78) 108/76 mmHg (11/29 1100) SpO2:  [94 %-100 %] 98 % (11/29 1100) HEMODYNAMICS:   VENTILATOR SETTINGS:   INTAKE / OUTPUT: Intake/Output     11/28 0701 - 11/29 0700 11/29 0701 - 11/30 0700   P.O. 670    I.V. (mL/kg) 1728 (15.2) 75 (0.7)   Blood     Other     IV Piggyback     Total Intake(mL/kg) 2398 (21.1) 75 (0.7)   Urine (mL/kg/hr) 1220 (0.4)    Drains 395 (0.1)    Total Output 1615     Net +783 +75          PHYSICAL EXAMINATION: General:  Mild painful distress. Neuro:  Alert and interactive, follows command, flickering of movement  Of toes HEENT:  [place]/AT, PERRL, EOM-I and MMM. Cardiovascular:  RRR, Nl S1/S2, -M/R/G. Lungs:  Decreased BS diffusely. Abdomen:  Obese, soft, ND, ND and +BS. Musculoskeletal:  Bilateral lower ext weakness 3/5, -edema and -tenderness. Skin:  Intact, wound clean.  LABS:  CBC  Recent Labs Lab  08/26/13 1830 08/27/13 0400 08/28/13 0426  WBC 11.9* 11.6* 6.4  HGB 10.0* 9.4* 8.5*  HCT 28.1* 27.2* 25.0*  PLT 137* 147* 133*   Coag's No results found for this basename: APTT, INR,  in the last 168 hours BMET  Recent Labs Lab 08/27/13 0400 08/27/13 0750 08/28/13 0426  NA 141 142 144  K 4.3 4.2 4.0  CL 107 107 108  CO2 29 26 29   BUN 13 14 16   CREATININE 0.64 0.64 0.60  GLUCOSE 142* 139* 135*   Electrolytes  Recent Labs Lab 08/26/13 1830 08/27/13 0400 08/27/13 0750 08/28/13 0426  CALCIUM 8.5 8.5 8.3* 8.3*  MG 1.9 2.1  --   --   PHOS 2.5 2.5  --   --    Sepsis Markers No results found for this basename: LATICACIDVEN, PROCALCITON, O2SATVEN,  in the last 168 hours ABG No results found for this basename: PHART, PCO2ART, PO2ART,  in the last 168 hours Liver Enzymes  Recent Labs Lab 08/27/13 0750  AST 32  ALT 21  ALKPHOS 52  BILITOT 0.4  ALBUMIN 3.0*   Cardiac Enzymes No results found for this basename: TROPONINI, PROBNP,  in the last 168 hours Glucose No results found for this basename: GLUCAP,  in the last 168 hours  Imaging No results found.  CXR: None  ASSESSMENT / PLAN:  PULMONARY A: Hypoxemia, likely OSA. Asthma  P:   - Titrate O2 for sat of 88-92%.- Will likely need a sleep study as outpatient. - Albuterol nebs prn. - To avoid DVT/PE will start heparin SQ and place SCD's. - CXR when patient is able to sit upright, will hold off for now due to CSF leak. - IS per RT protocol.  CARDIOVASCULAR A: Tachycardia, sinus.  Due to pain.  HTN history. P:  - Pain control. - Monitor. - Hold anti-HTN (on ARB and HCTZ at home), can resume on discharge. - Use hydralazine prn.  RENAL A:  Oliguria  P:   - Improving UO.  Monitor for now.  ENDOCRINE A:  Hypothyroidism by history.  On steroids. P:   - Check TSH. - Continue home thyroxin  NEUROLOGIC A:  Post op. P:   - Per NS. - Pain management.  Alyson Reedy, M.D. [facility]  Pulmonary/Critical Care Medicine. Pager: [phone]. After hours pager: [phone].

## 2013-08-28 NOTE — Progress Notes (Signed)
Subjective: Patient reports Lying flat in bed no headache today.  Objective: Vital signs in last 24 hours: Temp:  [97.4 F (36.3 C)-98.2 F (36.8 C)] 98.2 F (36.8 C) (11/29 0400) Pulse Rate:  [57-107] 60 (11/29 0800) Resp:  [9-20] 11 (11/29 0800) BP: (97-132)/(49-78) 100/57 mmHg (11/29 0800) SpO2:  [94 %-100 %] 98 % (11/29 0800)  Intake/Output from previous day: 11/28 0701 - 11/29 0700 In: 2398 [P.O.:670; I.V.:1728] Out: 1615 [Urine:1220; Drains:395] Intake/Output this shift: Total I/O In: 75 [I.V.:75] Out: -   motor function with 2-3 strength proximally in iliopsoas and quad no distal motion with flexion or extensor function noted.  Lab Results:  Recent Labs  08/27/13 0400 08/28/13 0426  WBC 11.6* 6.4  HGB 9.4* 8.5*  HCT 27.2* 25.0*  PLT 147* 133*   BMET  Recent Labs  08/27/13 0750 08/28/13 0426  NA 142 144  K 4.2 4.0  CL 107 108  CO2 26 29  GLUCOSE 139* 135*  BUN 14 16  CREATININE 0.64 0.60  CALCIUM 8.3* 8.3*    Studies/Results: No results found.  Assessment/Plan: Stable with moderate drainage from Hemovac at this time 175 cc and a shift.  LOS: 3 days  Continue flat in bed. Patient will be mobilized per Dr. Cassandria Santee instruction.   Nykerria Macconnell J 08/28/2013, 11:03 AM

## 2013-08-28 NOTE — Progress Notes (Signed)
eLink Physician-Brief Progress Note Patient Name: Sherry Wong DOB: 03/30/1956 MRN: [id number]  Date of Service  08/28/2013   HPI/Events of Note   Pt requested protonix be restarted  eICU Interventions  Restarted protonix   Intervention Category Minor Interventions: Routine modifications to care plan (e.g. PRN medications for pain, fever)  GIDDINGS, OLIVIA K. 08/28/2013, 9:45 PM

## 2013-08-29 NOTE — Progress Notes (Signed)
PULMONARY  / CRITICAL CARE MEDICINE  Name: Sherry Wong MRN: [id number] DOB: 08-09-1956    ADMISSION DATE:  08/25/2013 CONSULTATION DATE:  08/26/2013  REFERRING MD :  Dr. Jeral Fruit PRIMARY SERVICE: Neuro surgery  CHIEF COMPLAINT:  Medical management  BRIEF PATIENT DESCRIPTION: 57 year old female with hypothyroidism, asthma, GERD  who underwent a lumbar surgery subsequently suffered CSF leak.  Patient was taken back to the OR for exploration and is to be kept at 5 degree angle inorder to avoid further leak.  PCCM was consulted to address concern for PE/DVT prophylaxis as well as general medical management.  SIGNIFICANT EVENTS / STUDIES:  11/26 Decompression and fusion at l45, l5 S1. 11/27 re-exploration of surgical site for concern for CSF leak.  LINES / TUBES: PIV  CULTURES: None  ANTIBIOTICS: None  SUBJECTIVE: C/O pain at surgical site. C/o wheezing  VITAL SIGNS: Temp:  [97.3 F (36.3 C)-98.1 F (36.7 C)] 97.8 F (36.6 C) (11/30 0800) Pulse Rate:  [51-92] 53 (11/30 0700) Resp:  [10-21] 10 (11/30 0700) BP: (98-121)/(49-93) 104/64 mmHg (11/30 0700) SpO2:  [95 %-100 %] 99 % (11/30 0700) Weight:  [115.8 kg (255 lb 4.7 oz)] 115.8 kg (255 lb 4.7 oz) (11/30 0400) HEMODYNAMICS:   VENTILATOR SETTINGS:   INTAKE / OUTPUT: Intake/Output     11/29 0701 - 11/30 0700 11/30 0701 - 12/01 0700   P.O. 690 240   I.V. (mL/kg) 1901.1 (16.4) 79.5 (0.7)   Total Intake(mL/kg) 2591.1 (22.4) 319.5 (2.8)   Urine (mL/kg/hr) 1135 (0.4) 50 (0.1)   Drains 220 (0.1) 50 (0.1)   Total Output 1355 100   Net +1236.1 +219.5          PHYSICAL EXAMINATION: General:  Mild painful distress. Neuro:  Alert and interactive, follows command, flickering of movement  Of toes HEENT:  [place]/AT, PERRL, EOM-I and MMM. Cardiovascular:  RRR, Nl S1/S2, -M/R/G. Lungs:  Decreased BS diffusely. Abdomen:  Obese, soft, ND, ND and +BS. Musculoskeletal:  Bilateral lower ext weakness 3/5, -edema and  -tenderness. Skin:  Intact, wound clean.  LABS:  CBC  Recent Labs Lab 08/26/13 1830 08/27/13 0400 08/28/13 0426  WBC 11.9* 11.6* 6.4  HGB 10.0* 9.4* 8.5*  HCT 28.1* 27.2* 25.0*  PLT 137* 147* 133*   Coag's No results found for this basename: APTT, INR,  in the last 168 hours BMET  Recent Labs Lab 08/27/13 0400 08/27/13 0750 08/28/13 0426  NA 141 142 144  K 4.3 4.2 4.0  CL 107 107 108  CO2 29 26 29   BUN 13 14 16   CREATININE 0.64 0.64 0.60  GLUCOSE 142* 139* 135*   Electrolytes  Recent Labs Lab 08/26/13 1830 08/27/13 0400 08/27/13 0750 08/28/13 0426  CALCIUM 8.5 8.5 8.3* 8.3*  MG 1.9 2.1  --   --   PHOS 2.5 2.5  --   --    Sepsis Markers No results found for this basename: LATICACIDVEN, PROCALCITON, O2SATVEN,  in the last 168 hours ABG No results found for this basename: PHART, PCO2ART, PO2ART,  in the last 168 hours Liver Enzymes  Recent Labs Lab 08/27/13 0750  AST 32  ALT 21  ALKPHOS 52  BILITOT 0.4  ALBUMIN 3.0*   Cardiac Enzymes No results found for this basename: TROPONINI, PROBNP,  in the last 168 hours Glucose No results found for this basename: GLUCAP,  in the last 168 hours  Imaging No results found.  CXR: None  ASSESSMENT / PLAN:  PULMONARY A: Hypoxemia, likely OSA.  Asthma P:   - Titrate O2 for sat of 88-92%.- Will likely need a sleep study as outpatient. - Albuterol nebs prn. - To avoid DVT/PE will continue heparin SQ and place SCD's. - IS per RT protocol.  CARDIOVASCULAR A: Tachycardia, sinus.  Due to pain.  HTN history. P:  - Pain control - PCA. - Monitor. - Hold anti-HTN (on ARB and HCTZ at home), can resume on discharge. - Use hydralazine prn.  RENAL A:  Oliguria  P:   - Improving UO.  Monitor for now.  ENDOCRINE A:  Hypothyroidism by history.  On steroids. P:   - Check TSH. - Continue home thyroxin  NEUROLOGIC A:  Post op. P:   - Per NS. - Pain management.  Alyson Reedy, M.D. [facility]  Pulmonary/Critical Care Medicine. Pager: [phone]. After hours pager: [phone].

## 2013-08-29 NOTE — Progress Notes (Signed)
PT Cancellation Note  Patient Details Name: Sherry Wong MRN: [id number] DOB: [birth year]   Cancelled Treatment:    Reason Eval/Treat Not Completed: Patient not medically ready (Pt orders to slowly raise HOB to 45 degrees. ) Will hold until updated activity orders.   [name] 08/29/2013, 11:18 AM  Jake Shark, PT DPT [phone]

## 2013-08-29 NOTE — Progress Notes (Signed)
Hemoglobin 8.5. hemovac with serous drainage. HOB 30 degrees , no headache. Neuro unchanged with some flickering of the toes right more than left. Still able to flex her knees. Sensation unchanged. Plan to go up to 45 degrees, most likely remove the hemovac in am. Spoke with her and family.

## 2013-08-30 DIAGNOSIS — I621 Nontraumatic extradural hemorrhage: Secondary | ICD-10-CM

## 2013-08-30 DIAGNOSIS — IMO0002 Reserved for concepts with insufficient information to code with codable children: Secondary | ICD-10-CM

## 2013-08-30 DIAGNOSIS — M47817 Spondylosis without myelopathy or radiculopathy, lumbosacral region: Secondary | ICD-10-CM

## 2013-08-30 DIAGNOSIS — G8220 Paraplegia, unspecified: Secondary | ICD-10-CM

## 2013-08-30 LAB — POCT I-STAT 4, (NA,K, GLUC, HGB,HCT)
Glucose, Bld: 231 mg/dL — ABNORMAL HIGH (ref 70–99)
HCT: 29 % — ABNORMAL LOW (ref 36.0–46.0)
Hemoglobin: 9.9 g/dL — ABNORMAL LOW (ref 12.0–15.0)
Potassium: 5.4 mEq/L — ABNORMAL HIGH (ref 3.5–5.1)
Sodium: 139 mEq/L (ref 135–145)

## 2013-08-30 LAB — CBC
HCT: 25.5 % — ABNORMAL LOW (ref 36.0–46.0)
Hemoglobin: 8.6 g/dL — ABNORMAL LOW (ref 12.0–15.0)
MCH: 30.4 pg (ref 26.0–34.0)
MCHC: 33.7 g/dL (ref 30.0–36.0)
MCV: 90.1 fL (ref 78.0–100.0)
Platelets: 177 10*3/uL (ref 150–400)
RBC: 2.83 MIL/uL — ABNORMAL LOW (ref 3.87–5.11)
RDW: 14.2 % (ref 11.5–15.5)
WBC: 4.7 10*3/uL (ref 4.0–10.5)

## 2013-08-30 LAB — PHOSPHORUS: Phosphorus: 4.1 mg/dL (ref 2.3–4.6)

## 2013-08-30 LAB — MAGNESIUM: Magnesium: 2.4 mg/dL (ref 1.5–2.5)

## 2013-08-30 LAB — BASIC METABOLIC PANEL
BUN: 24 mg/dL — ABNORMAL HIGH (ref 6–23)
CO2: 28 mEq/L (ref 19–32)
Calcium: 8.2 mg/dL — ABNORMAL LOW (ref 8.4–10.5)
Chloride: 107 mEq/L (ref 96–112)
Creatinine, Ser: 0.69 mg/dL (ref 0.50–1.10)
GFR calc Af Amer: >90 mL/min (ref >90)
GFR calc non Af Amer: >90 mL/min (ref >90)
Glucose, Bld: 125 mg/dL — ABNORMAL HIGH (ref 70–99)
Potassium: 3.9 mEq/L (ref 3.5–5.1)
Sodium: 143 mEq/L (ref 135–145)

## 2013-08-30 MED ORDER — FLEET ENEMA 7-19 GM/118ML RE ENEM
1.0000 | ENEMA | Freq: Every day | RECTAL | Status: DC | PRN
  Filled 2013-08-30: qty 1

## 2013-08-30 NOTE — Progress Notes (Signed)
Rehab Admissions Coordinator Note:  Patient was screened by Clois Dupes for appropriateness for an Inpatient Acute Rehab Consult.  Inpt rehab consult is pending.  Clois Dupes 08/30/2013, 1:16 PM  I can be reached at [phone].

## 2013-08-30 NOTE — Evaluation (Signed)
Occupational Therapy Evaluation Patient Details Name: Sherry Wong MRN: [id number] DOB: December 10, 1955 Today's Date: 08/30/2013 Time: 4098-1191 OT Time Calculation (min): 38 min  OT Assessment / Plan / Recommendation History of present illness s/p L4-5 laminectomy and facetecomty with pedical screws at L4, L5, S1. Pt then suffered from CSF leak post surgery and has been in bed x 7 days.   Clinical Impression   Pt presents with significant LE weakness, R worse than L.  Pt requires +2 assist for transfers and is not able to ambulate.  She is dependent in toileting, dressing and bathing.  Pt will be an excellent in patient rehab candidate.  Will follow acutely.    OT Assessment  Patient needs continued OT Services    Follow Up Recommendations  CIR    Barriers to Discharge      Equipment Recommendations   (TBD)    Recommendations for Other Services Rehab consult  Frequency  Min 2X/week    Precautions / Restrictions Precautions Precautions: Back;Fall Precaution Booklet Issued: Yes (comment) Precaution Comments: pt with verbal understanding, spouse educated as well Restrictions Weight Bearing Restrictions: No   Pertinent Vitals/Pain LE pain, VSS, premedicated, repositioned.    ADL  Eating/Feeding: Independent Where Assessed - Eating/Feeding: Chair Grooming: Wash/dry hands;Wash/dry face;Brushing hair;Minimal assistance Where Assessed - Grooming: Unsupported sitting Upper Body Bathing: Minimal assistance Where Assessed - Upper Body Bathing: Unsupported sitting Lower Body Bathing: +1 Total assistance Where Assessed - Lower Body Bathing: Unsupported sitting;Supported sit to stand Upper Body Dressing: Minimal assistance Where Assessed - Upper Body Dressing: Unsupported sitting Lower Body Dressing: +1 Total assistance Where Assessed - Lower Body Dressing: Unsupported sitting;Supported sit to stand Toilet Transfer: +2 Total assistance Toilet Transfer: Patient Percentage:  30% Toilet Transfer Method: Squat pivot Toileting - Clothing Manipulation and Hygiene: +1 Total assistance Equipment Used: Gait belt Transfers/Ambulation Related to ADLs: +2 total assist pt 30 % ADL Comments: Decreased activity tolerance.    OT Diagnosis: Generalized weakness;Acute pain  OT Problem List: Decreased strength;Decreased activity tolerance;Impaired balance (sitting and/or standing);Decreased knowledge of use of DME or AE;Decreased knowledge of precautions;Obesity;Pain;Impaired sensation OT Treatment Interventions: Self-care/ADL training;Therapeutic activities;DME and/or AE instruction;Energy conservation;Patient/family education;Balance training   OT Goals(Current goals can be found in the care plan section) Acute Rehab OT Goals Patient Stated Goal: to go to rehab closer to home OT Goal Formulation: With patient Time For Goal Achievement: 09/13/13 Potential to Achieve Goals: Good ADL Goals Pt Will Perform Grooming: with set-up;sitting Pt Will Perform Upper Body Bathing: with set-up;sitting Pt Will Perform Upper Body Dressing: with set-up;sitting Pt Will Transfer to Toilet: with mod assist;stand pivot transfer;bedside commode Additional ADL Goal #1: Pt will sit EOB independently x 10 minutes while performing UB ADL. Additional ADL Goal #2: Pt will state 3 of 3 back precautions.  Visit Information  Last OT Received On: 08/30/13 Assistance Needed: +2 PT/OT Co-Evaluation/Treatment: Yes History of Present Illness: s/p L4-5 laminectomy and facetecomty with pedical screws at L4, L5, S1. Pt then suffered from CSF leak post surgery and has been in bed x 7 days.       Prior Functioning     Home Living Family/patient expects to be discharged to:: Inpatient rehab Living Arrangements: Spouse/significant other Additional Comments: pt lives in 1 story home with 5 steps to enter with bilat HR. Pt  was indep PTA and was not using any DME.  Prior Function Level of Independence:  Independent Communication Communication: No difficulties Dominant Hand: Right  Vision/Perception Vision - History Patient Visual Report: No change from baseline   Cognition  Cognition Arousal/Alertness: Awake/alert Behavior During Therapy: WFL for tasks assessed/performed Overall Cognitive Status: Within Functional Limits for tasks assessed    Extremity/Trunk Assessment Upper Extremity Assessment Upper Extremity Assessment: Generalized weakness Lower Extremity Assessment Lower Extremity Assessment: Defer to PT evaluation RLE Deficits / Details: grossly 2-/5 RLE Sensation: decreased light touch LLE Deficits / Details: grossly 3+/5 LLE Sensation: decreased light touch Cervical / Trunk Assessment Cervical / Trunk Assessment:  (recent back surgery)     Mobility Bed Mobility Bed Mobility: Rolling Right;Right Sidelying to Sit;Sitting - Scoot to Edge of Bed Rolling Right: 4: Min assist;With rail Right Sidelying to Sit: 2: Max assist Sitting - Scoot to [place] of Bed: 2: Max assist Details for Bed Mobility Assistance: max directional v/c's and tactile cues to complete transfers. assist to bring hips to EOB Transfers Transfers: Sit to Stand;Stand to Sit Sit to Stand: 1: +2 Total assist;With upper extremity assist;From bed Sit to Stand: Patient Percentage: 30% Stand to Sit: 1: +1 Total assist;With upper extremity assist;To chair/3-in-1;To bed Details for Transfer Assistance: pt with minimal sensation in R LE requiring maxA to prevent buckling. Pt with strong R lateral lean due to R sided weakness. Pt unable to step during transfer to chair requiring maxA to advance R LE.Bed pad and gait belt used to assist with weight-shift over to chair.     Exercise     Balance Balance Balance Assessed: Yes Static Sitting Balance Static Sitting - Balance Support: Bilateral upper extremity supported;Feet supported Static Sitting - Level of Assistance: 4: Min assist Static Sitting -  Comment/# of Minutes: 10   End of Session OT - End of Session Activity Tolerance: Patient limited by fatigue Patient left: in chair;with call bell/phone within reach;with family/visitor present Nurse Communication: Mobility status  GO     Evern Bio 08/30/2013, 2:06 PM [phone]

## 2013-08-30 NOTE — Evaluation (Signed)
Physical Therapy Evaluation Patient Details Name: Sherry Wong MRN: [id number] DOB: [birth year] Today's Date: 08/30/2013 Time: 7829-5621 PT Time Calculation (min): 38 min  PT Assessment / Plan / Recommendation History of Present Illness  s/p L4-5 laminectomy and facetecomty with pedical screws at L4, L5, S1. Pt then suffered from CSF leak post surgery and has been in bed x 7 days.  Clinical Impression  Pt presenting with significant R LE weakness and numbness requiring maximal assist for safe transfers. Pt is currenlty unsafe to ambulate at this time due to inability to maintain upright position. Pt indep PTA and with good home support and is an excellent candidate for CIR to achieve maximal functional recovery for safe transition back home with assist of family.     PT Assessment  Patient needs continued PT services    Follow Up Recommendations  CIR    Does the patient have the potential to tolerate intense rehabilitation      Barriers to Discharge        Equipment Recommendations   (TBD)    Recommendations for Other Services Rehab consult   Frequency Min 5X/week    Precautions / Restrictions Precautions Precautions: Back;Fall Precaution Booklet Issued: Yes (comment) Precaution Comments: pt with verbal understanding, spouse educated as well Restrictions Weight Bearing Restrictions: No   Pertinent Vitals/Pain Pt did not rate but c/o R LE pain and pushed pain pump x2      Mobility  Bed Mobility Bed Mobility: Rolling Right;Right Sidelying to Sit;Sitting - Scoot to Edge of Bed Rolling Right: 4: Min assist;With rail Right Sidelying to Sit: 2: Max assist Sitting - Scoot to [place] of Bed: 2: Max assist Details for Bed Mobility Assistance: max directional v/c's and tactile cues to complete transfers. assist to bring hips to EOB Transfers Transfers: Sit to Stand;Stand to Sit;Stand Pivot Transfers Sit to Stand: 1: +2 Total assist;With upper extremity assist;From bed Sit to  Stand: Patient Percentage: 30% Stand to Sit: 1: +1 Total assist;With upper extremity assist;To chair/3-in-1;To bed Stand Pivot Transfers: 1: +2 Total assist Stand Pivot Transfers: Patient Percentage: 20% Details for Transfer Assistance: pt with minimal sensation in R LE requiring maxA to prevent buckling. Pt with strong R lateral lean due to R sided weakness. Pt unable to step during transfer to chair requiring maxA to advance R LE.Bed pad and gait belt used to assist with weight-shift over to chair. Ambulation/Gait Ambulation/Gait Assistance: Not tested (comment)    Exercises     PT Diagnosis: Difficulty walking;Generalized weakness;Acute pain  PT Problem List: Decreased strength;Decreased activity tolerance;Decreased balance;Decreased mobility PT Treatment Interventions: DME instruction;Gait training;Functional mobility training;Therapeutic activities;Therapeutic exercise;Balance training     PT Goals(Current goals can be found in the care plan section) Acute Rehab PT Goals Patient Stated Goal: to go to rehab closer to home PT Goal Formulation: With patient Time For Goal Achievement: 09/13/13 Potential to Achieve Goals: Good  Visit Information  Last PT Received On: 08/30/13 History of Present Illness: s/p L4-5 laminectomy and facetecomty with pedical screws at L4, L5, S1. Pt then suffered from CSF leak post surgery and has been in bed x 7 days.       Prior Functioning  Home Living Family/patient expects to be discharged to:: Inpatient rehab Living Arrangements: Spouse/significant other Additional Comments: pt lives in 1 story home with 5 steps to enter with bilat HR. Pt  was indep PTA and was not using any DME.  Prior Function Level of Independence: Independent Communication Communication: No difficulties Dominant  Hand: Right    Cognition  Cognition Arousal/Alertness: Awake/alert Behavior During Therapy: WFL for tasks assessed/performed Overall Cognitive Status: Within  Functional Limits for tasks assessed    Extremity/Trunk Assessment Upper Extremity Assessment Upper Extremity Assessment: Overall WFL for tasks assessed Lower Extremity Assessment Lower Extremity Assessment: RLE deficits/detail;LLE deficits/detail RLE Deficits / Details: grossly 2-/5 RLE Sensation: decreased light touch (60% deficits) LLE Deficits / Details: grossly 3+/5 LLE Sensation: decreased light touch (approx 50%.) Cervical / Trunk Assessment Cervical / Trunk Assessment:  (recent back surgery)   Balance Balance Balance Assessed: Yes Static Sitting Balance Static Sitting - Balance Support: Bilateral upper extremity supported;Feet supported Static Sitting - Level of Assistance: 4: Min assist Static Sitting - Comment/# of Minutes: 10 min  End of Session PT - End of Session Equipment Utilized During Treatment: Gait belt Activity Tolerance: Patient tolerated treatment well Patient left: in chair;with call bell/phone within reach;with family/visitor present Nurse Communication: Mobility status;Need for lift equipment  GP     [name] 08/30/2013, 11:48 AM   Lewis Shock, PT, DPT Pager #: [phone] Office #: [phone]

## 2013-08-30 NOTE — Progress Notes (Signed)
PULMONARY  / CRITICAL CARE MEDICINE  Name: Sherry Wong MRN: [id number] DOB: December 18, 1955    ADMISSION DATE:  08/25/2013 CONSULTATION DATE:  08/26/2013  REFERRING MD :  Dr. Jeral Fruit PRIMARY SERVICE: Neuro surgery  CHIEF COMPLAINT:  Medical management  BRIEF PATIENT DESCRIPTION: 57 year old female with hypothyroidism, asthma, GERD  who underwent a lumbar surgery subsequently suffered CSF leak.  Patient was taken back to the OR for exploration and is to be kept at 5 degree angle inorder to avoid further leak.  PCCM was consulted to address concern for PE/DVT prophylaxis as well as general medical management.  SIGNIFICANT EVENTS / STUDIES:  11/26 Decompression and fusion at l45, l5 S1. 11/27 re-exploration of surgical site for concern for CSF leak.  LINES / TUBES: PIV  CULTURES: None  ANTIBIOTICS: None  SUBJECTIVE: C/o pain at surgical site.  C/o wheezing.  VITAL SIGNS: Temp:  [97.4 F (36.3 C)-98.3 F (36.8 C)] 97.6 F (36.4 C) (12/01 0820) Pulse Rate:  [52-98] 57 (12/01 0800) Resp:  [7-24] 12 (12/01 0820) BP: (98-137)/(55-82) 124/67 mmHg (12/01 0800) SpO2:  [96 %-100 %] 96 % (12/01 0820) Weight:  [117.9 kg (259 lb 14.8 oz)] 117.9 kg (259 lb 14.8 oz) (12/01 0400) HEMODYNAMICS:   VENTILATOR SETTINGS:   INTAKE / OUTPUT: Intake/Output     11/30 0701 - 12/01 0700 12/01 0701 - 12/02 0700   P.O. 360    I.V. (mL/kg) 1825.9 (15.5) 154.5 (1.3)   Total Intake(mL/kg) 2185.9 (18.5) 154.5 (1.3)   Urine (mL/kg/hr) 905 (0.3) 100 (0.3)   Drains 255 (0.1)    Total Output 1160 100   Net +1025.9 +54.5         PHYSICAL EXAMINATION: General:  NAD. Neuro:  Alert and interactive, follows command, flickering of movement  Of toes HEENT:  [place]/AT, PERRL, EOM-I and MMM. Cardiovascular:  RRR, Nl S1/S2, -M/R/G. Lungs:  Decreased BS diffusely. Abdomen:  Obese, soft, ND, ND and +BS. Musculoskeletal:  Bilateral lower ext weakness 3/5, -edema and -tenderness. Skin:  Intact, wound  clean.  LABS:  CBC  Recent Labs Lab 08/27/13 0400 08/28/13 0426 08/30/13 0412  WBC 11.6* 6.4 4.7  HGB 9.4* 8.5* 8.6*  HCT 27.2* 25.0* 25.5*  PLT 147* 133* 177   Coag's No results found for this basename: APTT, INR,  in the last 168 hours BMET  Recent Labs Lab 08/27/13 0750 08/28/13 0426 08/30/13 0412  NA 142 144 143  K 4.2 4.0 3.9  CL 107 108 107  CO2 26 29 28   BUN 14 16 24*  CREATININE 0.64 0.60 0.69  GLUCOSE 139* 135* 125*   Electrolytes  Recent Labs Lab 08/26/13 1830 08/27/13 0400 08/27/13 0750 08/28/13 0426 08/30/13 0412  CALCIUM 8.5 8.5 8.3* 8.3* 8.2*  MG 1.9 2.1  --   --  2.4  PHOS 2.5 2.5  --   --  4.1   Sepsis Markers No results found for this basename: LATICACIDVEN, PROCALCITON, O2SATVEN,  in the last 168 hours ABG No results found for this basename: PHART, PCO2ART, PO2ART,  in the last 168 hours Liver Enzymes  Recent Labs Lab 08/27/13 0750  AST 32  ALT 21  ALKPHOS 52  BILITOT 0.4  ALBUMIN 3.0*   Cardiac Enzymes No results found for this basename: TROPONINI, PROBNP,  in the last 168 hours Glucose No results found for this basename: GLUCAP,  in the last 168 hours  Imaging No results found.  CXR: None  ASSESSMENT / PLAN:  PULMONARY A: Hypoxemia,  likely OSA. Asthma P:   - Titrate O2 for sat of 88-92%.- Will likely need a sleep study as outpatient. - Albuterol nebs prn. - To avoid DVT/PE will continue heparin SQ and place SCD's. - IS per RT protocol.  CARDIOVASCULAR A: Tachycardia, sinus.  Due to pain.  HTN history. P:  - Pain control - per NS. - Monitor. - Hold anti-HTN (on ARB and HCTZ at home), can resume on discharge if BP rises stable for now. - Use hydralazine prn.  RENAL A:  Oliguria  P:   - Improving UO.  Monitor for now.  ENDOCRINE A:  Hypothyroidism by history.  On steroids. P:   - Check TSH. - Continue home thyroxin. - Continue decadron.  NEUROLOGIC A:  Post op. P:   - Per NS. - Pain  management.  Alyson Reedy, M.D. [facility] Pulmonary/Critical Care Medicine. Pager: [phone]. After hours pager: [phone].

## 2013-08-30 NOTE — Progress Notes (Signed)
Patient ID: Sherry Wong, female   DOB: 05-04-1956, 57 y.o.   MRN: [id number] Hemoglobin 8.6. To light touch she can feel more than yesterday including perineal area. More flickering of both toes. No headache. She is draining quite a bit of most likely csf, but again no headache. i removed the hemevac and applied 3 staples, to avoid a fistula. She will be oob and pt/ot to see her.will get rehabilitation medicine to help [place]. Continue with CCM help. Since she is ptrogressing i will hold any radiological studies

## 2013-08-30 NOTE — Progress Notes (Addendum)
Pt c/o headache after being in chair for approximately 2.5 hours. Headache resolved after getting back to bed and lying down (HOB not flat). Dr. Jeral Fruit made aware. Per MD, pt can get oob again today if pt wants. Will continue foley now per MD d/t very decreased mobility. Will continue to monitor.   Holly Bodily

## 2013-08-30 NOTE — Plan of Care (Signed)
Problem: Phase I Progression Outcomes Goal: OOB as tolerated unless otherwise ordered Outcome: Progressing oob today for 3 Hours total

## 2013-08-30 NOTE — Anesthesia Postprocedure Evaluation (Signed)
  Anesthesia Post-op Note  Patient: Sherry Wong  Procedure(s) Performed: Procedure(s):  Lumbar four-five, Lumbar five-Sacral one Transforaminal Lumbar Interbody Fusion  (N/A)  Patient Location: ICU  Anesthesia Type:General  Level of Consciousness: awake, alert  and oriented  Airway and Oxygen Therapy: Patient Spontanous Breathing and Patient connected to nasal cannula oxygen  Post-op Pain: none  Post-op Assessment: Post-op Vital signs reviewed, Patient's Cardiovascular Status Stable, Respiratory Function Stable, Patent Airway and No signs of Nausea or vomiting  Post-op Vital Signs: Reviewed and stable  Complications: No apparent anesthesia complications

## 2013-08-30 NOTE — Clinical Social Work Psychosocial (Addendum)
    Clinical Social Work Department BRIEF PSYCHOSOCIAL ASSESSMENT 08/30/2013  Patient:  Sherry Wong, Sherry Wong     Account Number:  [id number]     Admit date:  08/25/2013  Clinical Social Worker:  Tiburcio Pea  Date/Time:  08/29/2013 04:55 PM  Referred by:  Physician  Date Referred:  08/28/2013 Referred for  SNF Placement   Other Referral:   Interview type:  Patient Other interview type:    PSYCHOSOCIAL DATA Living Status:  HUSBAND Admitted from facility:   Level of care:   Primary support name:  Sharyn Dross Primary support relationship to patient:  SPOUSE Degree of support available:   Very strong support    CURRENT CONCERNS Current Concerns  Post-Acute Placement   Other Concerns:   CIR vs SNF    SOCIAL WORK ASSESSMENT / PLAN 57 year old female referred to CSW to assist with post acute SNF placement.  CSW met with patient today for introduction and support. Patient was lying in bed; alert and oriented- very pleasant. States that she lives at home wiht her husband Gilmer who "retired" and on disability since 2006. she has not worked since Feb 2013 and went out on disability for her back.  She is currently drawing social security disabilty and is paying for her [employer] via [employer].  Patient states that she will be eligible for Medicare in August 2015.  Patient relates that she has had back surgery and while in surgery they found that she had a blood clot. She is aware that she will require rehab and CSW briefly discussed SNF vs CIR.  This will need to be evaluated once Physical Therapy is able to assess her. Patient was open to all options.   Assessment/plan status:  Psychosocial Support/Ongoing Assessment of Needs Other assessment/ plan:   Information/referral to community resources:   None at this time    PATIENT'S/FAMILY'S RESPONSE TO PLAN OF CARE: Patient is alert, oriented and very pleasant.  She is understanding of need to consider rehab options.   States that her husband is very supportive and he is at home all the time so can also support her care at home if indicated. CSW will need to monitor and assist once PT has made their recommdations for rehab.  Lorri Frederick. Maclin Guerrette, LCSWA  [phone]

## 2013-08-30 NOTE — Consult Note (Signed)
Physical Medicine and Rehabilitation Consult  Reason for Consult: Cauda equina syndrome. Referring Physician:  Dr. Jeral Fruit   HPI: Sherry Wong is a 57 y.o. female with history of HTN, chronic back pain with radiation to BLE due to L4-5 and L5-S1 DDD. Patient elected to undergo L4-5 lam and L4/5 and L5/S1 disectomy with decompression of thecal sac on 08/25/13.  Post op with paraparesis due to CSF leak and patient taken back to OR later that evening for exploration of wound with evacuation of epidural hematoma. Post op ABLA noted with hgb-8.6. Has been on bedrest till today. Is able to flex knees, has flickering movement in toes and has had improvement in sensation BLE. PT evaluation done today and MD, PT recommending CIR.    Review of Systems  Constitutional: Negative.   Skin: Positive for itching.  Neurological: Positive for sensory change and focal weakness.  Psychiatric/Behavioral: The patient is nervous/anxious.   All other systems reviewed and are negative.   Past Medical History  Diagnosis Date  . PONV (postoperative nausea and vomiting)   . Hypertension     takes Losartan daily  . Cough   . Sinus drainage   . Pneumonia     hx of 15+yrs ago  . Chronic back pain     ddd/lumbago,and radiculopathy  . Arthritis   . Gout     hx of  . GERD (gastroesophageal reflux disease)     takes Protonix daily  . H/O hiatal hernia   . Gastric ulcer   . Ulcerative colitis   . IBS (irritable bowel syndrome)   . Constipation   . Diarrhea   . Hx of colonic polyps   . Urinary incontinence   . Nocturia   . Hypothyroidism     takes Levothyroxine daily  . Depression   . Anxiety     takes celexa daily   Past Surgical History  Procedure Laterality Date  . Cysts removed from ovary    . Cholecystectomy    . Dilation and curettage of uterus    . Breast surgery      left breast lumpectomy  . Breast biopsy      right  . Colonoscopy    . Esophagogastroduodenoscopy    . Lumbar  laminectomy/decompression microdiscectomy  12/03/2011    Procedure: LUMBAR LAMINECTOMY/DECOMPRESSION MICRODISCECTOMY 2 LEVELS;  Surgeon: Karn Cassis, MD;  Location: [facility];  Service: Neurosurgery;  Laterality: Right;  Right Lumbar four-five Lumbar five sacral one Foraminotomies   Family History  Problem Relation Age of Onset  . Anesthesia problems Sister   . Hypotension Neg Hx   . Malignant hyperthermia Neg Hx   . Pseudochol deficiency Neg Hx   . Anesthesia problems Mother    Social History:  Married. reports that she has never smoked. She has never used smokeless tobacco. She reports that she drinks alcohol. She reports that she does not use illicit drugs.   Allergies  Allergen Reactions  . Codeine Nausea Only   Medications Prior to Admission  Medication Sig Dispense Refill  . citalopram (CELEXA) 20 MG tablet Take 20 mg by mouth daily.      . diazepam (VALIUM) 5 MG tablet Take 5 mg by mouth every 8 (eight) hours as needed for muscle spasms.      [name] HYDROcodone-acetaminophen (NORCO) 10-325 MG per tablet Take 1 tablet by mouth every 6 (six) hours as needed (for pain).      . hyoscyamine (LEVSIN, ANASPAZ) 0.125 MG tablet Take  0.125 mg by mouth 3 (three) times daily.      [name] levothyroxine (SYNTHROID, LEVOTHROID) 150 MCG tablet Take 150 mcg by mouth daily.      [name] losartan-hydrochlorothiazide (HYZAAR) 50-12.5 MG per tablet Take 1 tablet by mouth daily.      . pantoprazole (PROTONIX) 40 MG tablet Take 40 mg by mouth daily.      [name] albuterol (PROVENTIL HFA;VENTOLIN HFA) 108 (90 BASE) MCG/ACT inhaler Inhale 2 puffs into the lungs 2 (two) times daily.        Home: Home Living Family/patient expects to be discharged to:: Inpatient rehab Living Arrangements: Spouse/significant other Additional Comments: pt lives in 1 story home with 5 steps to enter with bilat HR. Pt  was indep PTA and was not using any DME.   Functional History:   Functional Status:  Mobility: Bed Mobility Bed  Mobility: Rolling Right;Right Sidelying to Sit;Sitting - Scoot to [place] of Bed Rolling Right: 4: Min assist;With rail Right Sidelying to Sit: 2: Max assist Sitting - Scoot to [place] of Bed: 2: Max assist Transfers Transfers: Sit to Stand;Stand to [employer] Transfers Sit to Stand: 1: +2 Total assist;With upper extremity assist;From bed Sit to Stand: Patient Percentage: 30% Stand to Sit: 1: +1 Total assist;With upper extremity assist;To chair/3-in-1;To bed Stand Pivot Transfers: 1: +2 Total assist Stand Pivot Transfers: Patient Percentage: 20% Ambulation/Gait Ambulation/Gait Assistance: Not tested (comment)    ADL:    Cognition: Cognition Overall Cognitive Status: Within Functional Limits for tasks assessed Orientation Level: Oriented X4 Cognition Arousal/Alertness: Awake/alert Behavior During Therapy: WFL for tasks assessed/performed Overall Cognitive Status: Within Functional Limits for tasks assessed  Blood pressure 110/70, pulse 88, temperature 97.6 F (36.4 C), temperature source Oral, resp. rate 20, height 5\' 8"  (1.727 m), weight 117.9 kg (259 lb 14.8 oz), SpO2 100.00%. Physical Exam  Constitutional: She is oriented to person, place, and time. She appears well-developed and well-nourished.  HENT:  Head: Normocephalic and atraumatic.  Eyes: Conjunctivae and EOM are normal. Pupils are equal, round, and reactive to light.  Neck: Normal range of motion.  Cardiovascular: Normal rate.   Respiratory: Effort normal.  GI: Soft.  Neurological: She is alert and oriented to person, place, and time.  Decreased sensation 1/2 from lateral thigh to foot and back to buttocks. Can sense touch around buttocks (is sensing flatus also). 1+/5 at HF and KE. Ankles 0 to trace. dtr's trace to absent  Psychiatric: Her behavior is normal. Judgment and thought content normal.  Flat, but generally appropriate.     Results for orders placed during the hospital encounter of 08/25/13 (from the  past 24 hour(s))  CBC     Status: Abnormal   Collection Time    08/30/13  4:12 AM      Result Value Range   WBC 4.7  4.0 - 10.5 K/uL   RBC 2.83 (*) 3.87 - 5.11 MIL/uL   Hemoglobin 8.6 (*) 12.0 - 15.0 g/dL   HCT 78.4 (*) 69.6 - 29.5 %   MCV 90.1  78.0 - 100.0 fL   MCH 30.4  26.0 - 34.0 pg   MCHC 33.7  30.0 - 36.0 g/dL   RDW 28.4  13.2 - 44.0 %   Platelets 177  150 - 400 K/uL  BASIC METABOLIC PANEL     Status: Abnormal   Collection Time    08/30/13  4:12 AM      Result Value Range   Sodium 143  135 - 145 mEq/L  Potassium 3.9  3.5 - 5.1 mEq/L   Chloride 107  96 - 112 mEq/L   CO2 28  19 - 32 mEq/L   Glucose, Bld 125 (*) 70 - 99 mg/dL   BUN 24 (*) 6 - 23 mg/dL   Creatinine, Ser 1.61  0.50 - 1.10 mg/dL   Calcium 8.2 (*) 8.4 - 10.5 mg/dL   GFR calc non Af Amer >90  >90 mL/min   GFR calc Af Amer >90  >90 mL/min  MAGNESIUM     Status: None   Collection Time    08/30/13  4:12 AM      Result Value Range   Magnesium 2.4  1.5 - 2.5 mg/dL  PHOSPHORUS     Status: None   Collection Time    08/30/13  4:12 AM      Result Value Range   Phosphorus 4.1  2.3 - 4.6 mg/dL   No results found.  Assessment/Plan: Diagnosis: epidural hematoma after recent lumbar spine surgery, cauda equina syndrome 1. Does the need for close, 24 hr/day medical supervision in concert with the patient's rehab needs make it unreasonable for this patient to be served in a less intensive setting? Yes 2. Co-Morbidities requiring supervision/potential complications: htn, morbid obesity 3. Due to bladder management, bowel management, safety, skin/wound care, disease management, medication administration, pain management and patient education, does the patient require 24 hr/day rehab nursing? Yes 4. Does the patient require coordinated care of a physician, rehab nurse, PT (1-2 hrs/day, 5 days/week) and OT (1-2 hrs/day, 5 days/week) to address physical and functional deficits in the context of the above medical  diagnosis(es)? Yes Addressing deficits in the following areas: balance, endurance, locomotion, strength, transferring, bowel/bladder control, bathing, dressing, feeding, grooming, toileting and psychosocial support 5. Can the patient actively participate in an intensive therapy program of at least 3 hrs of therapy per day at least 5 days per week? Yes 6. The potential for patient to make measurable gains while on inpatient rehab is excellent 7. Anticipated functional outcomes upon discharge from inpatient rehab are mod I w/c with PT, mod I to min assist with OT, n/a with SLP. 8. Estimated rehab length of stay to reach the above functional goals is: 20-25 days 9. Does the patient have adequate social supports to accommodate these discharge functional goals? Yes 10. Anticipated D/C setting: Home 11. Anticipated post D/C treatments: HH therapy 12. Overall Rehab/Functional Prognosis: excellent  RECOMMENDATIONS: This patient's condition is appropriate for continued rehabilitative care in the following setting: CIR Patient has agreed to participate in recommended program. Yes Note that insurance prior authorization may be required for reimbursement for recommended care.  Comment: Rehab RN to follow up.   Ranelle Oyster, MD, [place] [facility]     08/30/2013

## 2013-08-31 ENCOUNTER — Encounter (HOSPITAL_COMMUNITY): Payer: Self-pay | Admitting: Neurosurgery

## 2013-08-31 MED ORDER — FLEET ENEMA 7-19 GM/118ML RE ENEM
1.0000 | ENEMA | Freq: Every day | RECTAL | Status: DC | PRN
  Filled 2013-08-31: qty 1

## 2013-08-31 MED ORDER — DOCUSATE SODIUM 100 MG PO CAPS
200.0000 mg | ORAL_CAPSULE | Freq: Two times a day (BID) | ORAL | Status: DC
  Administered 2013-08-31 – 2013-09-08 (×14): 200 mg via ORAL
  Filled 2013-08-31 (×17): qty 2

## 2013-08-31 MED ORDER — DEXAMETHASONE 4 MG PO TABS
4.0000 mg | ORAL_TABLET | Freq: Four times a day (QID) | ORAL | Status: DC
  Administered 2013-08-31 – 2013-09-01 (×4): 4 mg via ORAL
  Filled 2013-08-31 (×7): qty 1

## 2013-08-31 NOTE — Progress Notes (Signed)
Patient ID: Sherry Wong, female   DOB: 02-13-1956, 57 y.o.   MRN: [id number] Stable. Oob. Some headache in am , better with analgesics. Sensory present. Flatus positive. Good strength proximally. Flickers her toes. Feels the pulling of the foley. Plan to 4 tower till rehabilitation agrees with her going to their service

## 2013-08-31 NOTE — Progress Notes (Signed)
Patient ID: Sherry Wong, female   DOB: Jan 30, 1956, 57 y.o.   MRN: [id number] To 4 tower

## 2013-08-31 NOTE — Progress Notes (Addendum)
PULMONARY  / CRITICAL CARE MEDICINE  Name: Sherry Wong MRN: [id number] DOB: Apr 09, 1956    ADMISSION DATE:  08/25/2013 CONSULTATION DATE:  08/26/2013  REFERRING MD :  Dr. Jeral Fruit PRIMARY SERVICE: Neuro surgery  CHIEF COMPLAINT:  Medical management  BRIEF PATIENT DESCRIPTION: 57 year old female with hypothyroidism, asthma, GERD  who underwent a lumbar surgery subsequently suffered CSF leak.  Patient was taken back to the OR for exploration and is to be kept at 5 degree angle inorder to avoid further leak.  PCCM was consulted to address concern for PE/DVT prophylaxis as well as general medical management.  SIGNIFICANT EVENTS / STUDIES:  11/26 Decompression and fusion at l45, l5 S1. 11/27 re-exploration of surgical site for concern for CSF leak.  LINES / TUBES: PIV  CULTURES: None  ANTIBIOTICS: None  SUBJECTIVE: C/o pain at surgical site.  C/o wheezing.  VITAL SIGNS: Temp:  [97.6 F (36.4 C)-98.3 F (36.8 C)] 97.8 F (36.6 C) (12/02 0444) Pulse Rate:  [53-88] 71 (12/02 0800) Resp:  [7-23] 17 (12/02 0800) BP: (103-148)/(41-94) 148/41 mmHg (12/02 0800) SpO2:  [98 %-100 %] 100 % (12/02 0800) HEMODYNAMICS:   VENTILATOR SETTINGS:   INTAKE / OUTPUT: Intake/Output     12/01 0701 - 12/02 0700 12/02 0701 - 12/03 0700   P.O. 950    I.V. (mL/kg) 1826.7 (15.5) 3 (0)   Total Intake(mL/kg) 2776.7 (23.6) 3 (0)   Urine (mL/kg/hr) 1405 (0.5)    Drains     Total Output 1405     Net +1371.7 +3         PHYSICAL EXAMINATION: General:  NAD. Neuro:  Alert and interactive, follows command, flickering of movement  Of toes HEENT:  [place]/AT, PERRL, EOM-I and MMM. Cardiovascular:  RRR, Nl S1/S2, -M/R/G. Lungs:  Decreased BS diffusely. Abdomen:  Obese, soft, ND, ND and +BS. Musculoskeletal:  Bilateral lower ext weakness 3/5, -edema and -tenderness. Skin:  Intact, wound clean.  LABS:  CBC  Recent Labs Lab 08/27/13 0400 08/28/13 0426 08/30/13 0412  WBC 11.6* 6.4 4.7  HGB 9.4*  8.5* 8.6*  HCT 27.2* 25.0* 25.5*  PLT 147* 133* 177   Coag's No results found for this basename: APTT, INR,  in the last 168 hours BMET  Recent Labs Lab 08/27/13 0750 08/28/13 0426 08/30/13 0412  NA 142 144 143  K 4.2 4.0 3.9  CL 107 108 107  CO2 26 29 28   BUN 14 16 24*  CREATININE 0.64 0.60 0.69  GLUCOSE 139* 135* 125*   Electrolytes  Recent Labs Lab 08/26/13 1830 08/27/13 0400 08/27/13 0750 08/28/13 0426 08/30/13 0412  CALCIUM 8.5 8.5 8.3* 8.3* 8.2*  MG 1.9 2.1  --   --  2.4  PHOS 2.5 2.5  --   --  4.1   Sepsis Markers No results found for this basename: LATICACIDVEN, PROCALCITON, O2SATVEN,  in the last 168 hours ABG No results found for this basename: PHART, PCO2ART, PO2ART,  in the last 168 hours Liver Enzymes  Recent Labs Lab 08/27/13 0750  AST 32  ALT 21  ALKPHOS 52  BILITOT 0.4  ALBUMIN 3.0*   Cardiac Enzymes No results found for this basename: TROPONINI, PROBNP,  in the last 168 hours Glucose No results found for this basename: GLUCAP,  in the last 168 hours  Imaging No results found.  CXR: None  ASSESSMENT / PLAN:  PULMONARY A: Hypoxemia, likely OSA. Asthma P:   - Titrate O2 for sat of 88-92%.- Will likely need a  sleep study as outpatient. - Albuterol nebs prn. - To avoid DVT/PE will continue heparin SQ and place SCD's. - IS per RT protocol.  CARDIOVASCULAR A: Tachycardia, sinus.  Due to pain.  HTN history. P:  - Pain control - per NS. - Monitor. - Hold anti-HTN (on ARB and HCTZ at home), can resume on discharge if BP rises stable for now. - Use hydralazine prn.  RENAL A:  Oliguria  P:   - Improving UO.  Monitor for now. - Recommend d/c of IVF but will defer to neuro.  ENDOCRINE A:  Hypothyroidism by history.  On steroids. P:   - Check TSH. - Continue home thyroxin. - Continue decadron.  NEUROLOGIC A:  Post op. P:   - Per NS. - Pain management.  Patient ready to leave ICU, CIR looking into taking patient.     Alyson Reedy, M.D. [facility] Pulmonary/Critical Care Medicine. Pager: [phone]. After hours pager: [phone].

## 2013-08-31 NOTE — Progress Notes (Signed)
Physical Therapy Treatment Patient Details Name: Sherry Wong MRN: [id number] DOB: 08-20-1956 Today's Date: 08/31/2013 Time: 4098-1191 PT Time Calculation (min): 28 min  PT Assessment / Plan / Recommendation  History of Present Illness s/p L4-5 laminectomy and facetecomty with pedical screws at L4, L5, S1. Pt then suffered from CSF leak post surgery and has been in bed x 7 days.   PT Comments   Pt con't to have minimal sensation in R LE and bilat feet. Pt with improved ability to complete R LAQ this date demoing full active ROM. Pt  con't to require maximal assist to achieve standing and oob transfer. Pt to con't to benefit from CIR upon d/c to maximize functional recovery.  Follow Up Recommendations  CIR     Does the patient have the potential to tolerate intense rehabilitation     Barriers to Discharge        Equipment Recommendations       Recommendations for Other Services Rehab consult  Frequency Min 5X/week   Progress towards PT Goals Progress towards PT goals: Progressing toward goals  Plan Current plan remains appropriate    Precautions / Restrictions Precautions Precautions: Back;Fall Restrictions Weight Bearing Restrictions: No   Pertinent Vitals/Pain R LE pain    Mobility  Bed Mobility Bed Mobility: Rolling Right;Right Sidelying to Sit;Sitting - Scoot to Edge of Bed Rolling Right: 4: Min assist;With rail Right Sidelying to Sit: 2: Max assist Sitting - Scoot to [place] of Bed: 2: Max assist Details for Bed Mobility Assistance: max directional v/c's and tactile cues to complete transfers. assist to bring hips to EOB Transfers Transfers: Sit to Stand;Stand to Sit;Stand Pivot Transfers Sit to Stand: 1: +2 Total assist;With upper extremity assist;From bed Sit to Stand: Patient Percentage: 30% Stand to Sit: 1: +2 Total assist;To chair/3-in-1 Stand to Sit: Patient Percentage: 30% Stand Pivot Transfers: 1: +2 Total assist Stand Pivot Transfers: Patient Percentage:  30% Details for Transfer Assistance: pt con't to have minimal senstation in bilat feet. pt able to initiate step with L LE however con't to require total weight shift at hips to complete std pvt to chair Ambulation/Gait Ambulation/Gait Assistance: Not tested (comment)    Exercises General Exercises - Lower Extremity Ankle Circles/Pumps:  (attempted however pt unable, bilat drop foot) Quad Sets: AROM;Right;10 reps;Supine Long Arc Quad: AROM;Both;10 reps;Seated Heel Slides:  (unable to complete with R LE)   PT Diagnosis:    PT Problem List:   PT Treatment Interventions:     PT Goals (current goals can now be found in the care plan section)    Visit Information  Last PT Received On: 08/31/13 Assistance Needed: +2 History of Present Illness: s/p L4-5 laminectomy and facetecomty with pedical screws at L4, L5, S1. Pt then suffered from CSF leak post surgery and has been in bed x 7 days.    Subjective Data      Cognition  Cognition Arousal/Alertness: Awake/alert Behavior During Therapy: WFL for tasks assessed/performed Overall Cognitive Status: Within Functional Limits for tasks assessed    Balance     End of Session PT - End of Session Equipment Utilized During Treatment: Gait belt Activity Tolerance: Patient tolerated treatment well Patient left: in chair;with call bell/phone within reach;with family/visitor present Nurse Communication: Mobility status;Need for lift equipment   GP     [name] 08/31/2013, 11:25 AM  Lewis Shock, PT, DPT Pager #: [phone] Office #: [phone]

## 2013-09-01 ENCOUNTER — Inpatient Hospital Stay (HOSPITAL_COMMUNITY): Payer: BC Managed Care – PPO | Admitting: Anesthesiology

## 2013-09-01 ENCOUNTER — Inpatient Hospital Stay (HOSPITAL_COMMUNITY): Payer: BC Managed Care – PPO

## 2013-09-01 ENCOUNTER — Encounter (HOSPITAL_COMMUNITY): Payer: Self-pay | Admitting: Anesthesiology

## 2013-09-01 ENCOUNTER — Encounter (HOSPITAL_COMMUNITY): Payer: BC Managed Care – PPO | Admitting: Anesthesiology

## 2013-09-01 ENCOUNTER — Encounter (HOSPITAL_COMMUNITY)
Admission: RE | Disposition: A | Discharge status: Rehabilitation Facility | Payer: Self-pay | Source: Ambulatory Visit | Attending: Neurosurgery

## 2013-09-01 HISTORY — PX: LUMBAR WOUND DEBRIDEMENT: SHX1988

## 2013-09-01 HISTORY — PX: PLACEMENT OF LUMBAR DRAIN: SHX6028

## 2013-09-01 LAB — CBC
HCT: 27.3 % — ABNORMAL LOW (ref 36.0–46.0)
Hemoglobin: 9.2 g/dL — ABNORMAL LOW (ref 12.0–15.0)
MCH: 30 pg (ref 26.0–34.0)
MCHC: 33.7 g/dL (ref 30.0–36.0)
MCV: 88.9 fL (ref 78.0–100.0)
Platelets: 220 10*3/uL (ref 150–400)
RBC: 3.07 MIL/uL — ABNORMAL LOW (ref 3.87–5.11)
RDW: 13.9 % (ref 11.5–15.5)
WBC: 11.1 10*3/uL — ABNORMAL HIGH (ref 4.0–10.5)

## 2013-09-01 LAB — GRAM STAIN

## 2013-09-01 LAB — BASIC METABOLIC PANEL
BUN: 22 mg/dL (ref 6–23)
CO2: 27 mEq/L (ref 19–32)
Calcium: 7.8 mg/dL — ABNORMAL LOW (ref 8.4–10.5)
Chloride: 105 mEq/L (ref 96–112)
Creatinine, Ser: 0.68 mg/dL (ref 0.50–1.10)
GFR calc Af Amer: >90 mL/min (ref >90)
GFR calc non Af Amer: >90 mL/min (ref >90)
Glucose, Bld: 169 mg/dL — ABNORMAL HIGH (ref 70–99)
Potassium: 2.7 mEq/L — CL (ref 3.5–5.1)
Sodium: 141 mEq/L (ref 135–145)

## 2013-09-01 LAB — GLUCOSE, CAPILLARY: Glucose-Capillary: 130 mg/dL — ABNORMAL HIGH (ref 70–99)

## 2013-09-01 SURGERY — PLACEMENT OF LUMBAR DRAIN
Anesthesia: General | Site: Back

## 2013-09-01 MED ORDER — POTASSIUM CHLORIDE 10 MEQ/100ML IV SOLN
10.0000 meq | INTRAVENOUS | Status: AC
  Administered 2013-09-01 (×3): 10 meq via INTRAVENOUS
  Filled 2013-09-01 (×3): qty 100

## 2013-09-01 MED ORDER — KETOROLAC TROMETHAMINE 30 MG/ML IJ SOLN
INTRAMUSCULAR | Status: AC
  Administered 2013-09-01: 30 mg
  Filled 2013-09-01: qty 1

## 2013-09-01 MED ORDER — HEMOSTATIC AGENTS (NO CHARGE) OPTIME
TOPICAL | Status: DC | PRN
  Administered 2013-09-01: 1 via TOPICAL

## 2013-09-01 MED ORDER — HYDROMORPHONE HCL PF 1 MG/ML IJ SOLN
0.2500 mg | INTRAMUSCULAR | Status: DC | PRN
  Administered 2013-09-02 (×2): 0.5 mg via INTRAVENOUS

## 2013-09-01 MED ORDER — THROMBIN 5000 UNITS EX SOLR
CUTANEOUS | Status: DC | PRN
  Administered 2013-09-01 (×2): 5000 [IU] via TOPICAL

## 2013-09-01 MED ORDER — PROPOFOL 10 MG/ML IV BOLUS
INTRAVENOUS | Status: DC | PRN
  Administered 2013-09-01: 200 mg via INTRAVENOUS

## 2013-09-01 MED ORDER — HYDROMORPHONE HCL PF 1 MG/ML IJ SOLN
INTRAMUSCULAR | Status: AC
  Administered 2013-09-02: 0.5 mg via INTRAVENOUS
  Filled 2013-09-01: qty 2

## 2013-09-01 MED ORDER — ALBUTEROL SULFATE HFA 108 (90 BASE) MCG/ACT IN AERS
INHALATION_SPRAY | RESPIRATORY_TRACT | Status: DC | PRN
  Administered 2013-09-01 (×2): 1 via RESPIRATORY_TRACT

## 2013-09-01 MED ORDER — LOSARTAN POTASSIUM 50 MG PO TABS
50.0000 mg | ORAL_TABLET | Freq: Every day | ORAL | Status: DC
  Administered 2013-09-01 – 2013-09-08 (×4): 50 mg via ORAL
  Filled 2013-09-01 (×8): qty 1

## 2013-09-01 MED ORDER — KETOROLAC TROMETHAMINE 15 MG/ML IJ SOLN
15.0000 mg | Freq: Once | INTRAMUSCULAR | Status: AC
  Filled 2013-09-01: qty 1

## 2013-09-01 MED ORDER — HYDROCHLOROTHIAZIDE 12.5 MG PO CAPS
12.5000 mg | ORAL_CAPSULE | Freq: Every day | ORAL | Status: DC
  Administered 2013-09-01 – 2013-09-08 (×3): 12.5 mg via ORAL
  Filled 2013-09-01 (×8): qty 1

## 2013-09-01 MED ORDER — LOSARTAN POTASSIUM-HCTZ 50-12.5 MG PO TABS
1.0000 | ORAL_TABLET | Freq: Every day | ORAL | Status: DC
Start: 2013-09-01 — End: 2013-09-01

## 2013-09-01 MED ORDER — NEOSTIGMINE METHYLSULFATE 1 MG/ML IJ SOLN
INTRAMUSCULAR | Status: DC | PRN
  Administered 2013-09-01: 4 mg via INTRAVENOUS

## 2013-09-01 MED ORDER — ONDANSETRON HCL 4 MG/2ML IJ SOLN
INTRAMUSCULAR | Status: DC | PRN
  Administered 2013-09-01: 4 mg via INTRAVENOUS

## 2013-09-01 MED ORDER — ALBUTEROL SULFATE HFA 108 (90 BASE) MCG/ACT IN AERS
2.0000 | INHALATION_SPRAY | Freq: Two times a day (BID) | RESPIRATORY_TRACT | Status: DC
  Administered 2013-09-01 – 2013-09-08 (×14): 2 via RESPIRATORY_TRACT
  Filled 2013-09-01: qty 6.7

## 2013-09-01 MED ORDER — HYOSCYAMINE SULFATE 0.125 MG PO TABS
0.1250 mg | ORAL_TABLET | Freq: Three times a day (TID) | ORAL | Status: DC
  Administered 2013-09-01 – 2013-09-07 (×19): 0.125 mg via ORAL
  Filled 2013-09-01 (×24): qty 1

## 2013-09-01 MED ORDER — LIDOCAINE HCL (CARDIAC) 20 MG/ML IV SOLN
INTRAVENOUS | Status: DC | PRN
  Administered 2013-09-01: 100 mg via INTRAVENOUS

## 2013-09-01 MED ORDER — VECURONIUM BROMIDE 10 MG IV SOLR
INTRAVENOUS | Status: DC | PRN
  Administered 2013-09-01 (×2): 3 mg via INTRAVENOUS

## 2013-09-01 MED ORDER — GLYCOPYRROLATE 0.2 MG/ML IJ SOLN
INTRAMUSCULAR | Status: DC | PRN
  Administered 2013-09-01: .6 mg via INTRAVENOUS

## 2013-09-01 MED ORDER — CEFAZOLIN SODIUM-DEXTROSE 2-3 GM-% IV SOLR
INTRAVENOUS | Status: DC | PRN
  Administered 2013-09-01: 2 g via INTRAVENOUS

## 2013-09-01 MED ORDER — PROMETHAZINE HCL 25 MG/ML IJ SOLN: 6.2500 mg | INTRAMUSCULAR | Status: DC | PRN

## 2013-09-01 MED ORDER — PANTOPRAZOLE SODIUM 40 MG PO TBEC: 40.0000 mg | DELAYED_RELEASE_TABLET | Freq: Every day | ORAL | Status: DC

## 2013-09-01 MED ORDER — PHENYLEPHRINE HCL 10 MG/ML IJ SOLN
INTRAMUSCULAR | Status: DC | PRN
  Administered 2013-09-01: 40 ug via INTRAVENOUS

## 2013-09-01 MED ORDER — FENTANYL CITRATE 0.05 MG/ML IJ SOLN
INTRAMUSCULAR | Status: DC | PRN
  Administered 2013-09-01 (×3): 100 ug via INTRAVENOUS
  Administered 2013-09-01: 50 ug via INTRAVENOUS
  Administered 2013-09-01: 100 ug via INTRAVENOUS
  Administered 2013-09-02: 50 ug via INTRAVENOUS

## 2013-09-01 MED ORDER — HYDROCODONE-ACETAMINOPHEN 10-325 MG PO TABS
1.0000 | ORAL_TABLET | Freq: Four times a day (QID) | ORAL | Status: DC | PRN
  Administered 2013-09-02 – 2013-09-08 (×8): 1 via ORAL
  Filled 2013-09-01 (×8): qty 1

## 2013-09-01 MED ORDER — SUCCINYLCHOLINE CHLORIDE 20 MG/ML IJ SOLN
INTRAMUSCULAR | Status: DC | PRN
  Administered 2013-09-01: 120 mg via INTRAVENOUS

## 2013-09-01 MED ORDER — LACTATED RINGERS IV SOLN
INTRAVENOUS | Status: DC | PRN
  Administered 2013-09-01 (×2): via INTRAVENOUS

## 2013-09-01 MED ORDER — EPHEDRINE SULFATE 50 MG/ML IJ SOLN
INTRAMUSCULAR | Status: DC | PRN
  Administered 2013-09-01: 10 mg via INTRAVENOUS
  Administered 2013-09-01 (×3): 5 mg via INTRAVENOUS

## 2013-09-01 MED ORDER — ALBUMIN HUMAN 5 % IV SOLN
INTRAVENOUS | Status: DC | PRN
  Administered 2013-09-01 (×2): via INTRAVENOUS

## 2013-09-01 MED ORDER — DIAZEPAM 5 MG PO TABS: 5.0000 mg | ORAL_TABLET | Freq: Three times a day (TID) | ORAL | Status: DC | PRN

## 2013-09-01 MED ORDER — 0.9 % SODIUM CHLORIDE (POUR BTL) OPTIME
TOPICAL | Status: DC | PRN
  Administered 2013-09-01: 1000 mL

## 2013-09-01 MED ORDER — ARTIFICIAL TEARS OP OINT
TOPICAL_OINTMENT | OPHTHALMIC | Status: DC | PRN
  Administered 2013-09-01: 1 via OPHTHALMIC

## 2013-09-01 SURGICAL SUPPLY — 58 items
BENZOIN TINCTURE PRP APPL 2/3 (GAUZE/BANDAGES/DRESSINGS) IMPLANT
BLADE SURG ROTATE 9660 (MISCELLANEOUS) IMPLANT
CANISTER SUCT 3000ML (MISCELLANEOUS) ×2 IMPLANT
CONT SPEC STER OR (MISCELLANEOUS) ×4 IMPLANT
DRAPE C-ARM 42X72 X-RAY (DRAPES) ×4 IMPLANT
DRAPE LAPAROTOMY 100X72X124 (DRAPES) ×2 IMPLANT
DRAPE MICROSCOPE LEICA (MISCELLANEOUS) ×2 IMPLANT
DRAPE POUCH INSTRU U-SHP 10X18 (DRAPES) ×2 IMPLANT
DRSG OPSITE POSTOP 4X10 (GAUZE/BANDAGES/DRESSINGS) ×2 IMPLANT
DRSG PAD ABDOMINAL 8X10 ST (GAUZE/BANDAGES/DRESSINGS) ×2 IMPLANT
DURAPREP 26ML APPLICATOR (WOUND CARE) IMPLANT
ELECT REM PT RETURN 9FT ADLT (ELECTROSURGICAL) ×2
ELECTRODE REM PT RTRN 9FT ADLT (ELECTROSURGICAL) ×1 IMPLANT
GAUZE SPONGE 4X4 16PLY XRAY LF (GAUZE/BANDAGES/DRESSINGS) IMPLANT
GLOVE BIO SURGEON STRL SZ 6.5 (GLOVE) ×4 IMPLANT
GLOVE BIO SURGEON STRL SZ7 (GLOVE) ×2 IMPLANT
GLOVE BIO SURGEON STRL SZ7.5 (GLOVE) IMPLANT
GLOVE BIO SURGEON STRL SZ8 (GLOVE) IMPLANT
GLOVE BIO SURGEON STRL SZ8.5 (GLOVE) IMPLANT
GLOVE BIOGEL M 8.0 STRL (GLOVE) ×2 IMPLANT
GLOVE ECLIPSE 6.5 STRL STRAW (GLOVE) IMPLANT
GLOVE ECLIPSE 7.0 STRL STRAW (GLOVE) IMPLANT
GLOVE ECLIPSE 7.5 STRL STRAW (GLOVE) IMPLANT
GLOVE ECLIPSE 8.0 STRL XLNG CF (GLOVE) ×2 IMPLANT
GLOVE ECLIPSE 8.5 STRL (GLOVE) IMPLANT
GLOVE EXAM NITRILE LRG STRL (GLOVE) IMPLANT
GLOVE EXAM NITRILE MD LF STRL (GLOVE) IMPLANT
GLOVE EXAM NITRILE XL STR (GLOVE) IMPLANT
GLOVE EXAM NITRILE XS STR PU (GLOVE) IMPLANT
GLOVE INDICATOR 6.5 STRL GRN (GLOVE) IMPLANT
GLOVE INDICATOR 7.0 STRL GRN (GLOVE) ×2 IMPLANT
GLOVE INDICATOR 7.5 STRL GRN (GLOVE) IMPLANT
GLOVE INDICATOR 8.0 STRL GRN (GLOVE) IMPLANT
GLOVE INDICATOR 8.5 STRL (GLOVE) ×2 IMPLANT
GLOVE OPTIFIT SS 8.0 STRL (GLOVE) IMPLANT
GLOVE SURG SS PI 6.5 STRL IVOR (GLOVE) IMPLANT
GOWN BRE IMP SLV AUR LG STRL (GOWN DISPOSABLE) ×2 IMPLANT
GOWN BRE IMP SLV AUR XL STRL (GOWN DISPOSABLE) IMPLANT
GOWN STRL REIN 2XL LVL4 (GOWN DISPOSABLE) IMPLANT
KIT BASIN OR (CUSTOM PROCEDURE TRAY) ×2 IMPLANT
KIT ROOM TURNOVER OR (KITS) ×2 IMPLANT
NS IRRIG 1000ML POUR BTL (IV SOLUTION) ×2 IMPLANT
PACK LAMINECTOMY NEURO (CUSTOM PROCEDURE TRAY) ×2 IMPLANT
PAD ARMBOARD 7.5X6 YLW CONV (MISCELLANEOUS) ×6 IMPLANT
RUBBERBAND STERILE (MISCELLANEOUS) ×4 IMPLANT
SPONGE GAUZE 4X4 12PLY (GAUZE/BANDAGES/DRESSINGS) ×2 IMPLANT
SPONGE SURGIFOAM ABS GEL SZ50 (HEMOSTASIS) ×2 IMPLANT
STRIP CLOSURE SKIN 1/2X4 (GAUZE/BANDAGES/DRESSINGS) IMPLANT
SUT VIC AB 0 CT1 18XCR BRD8 (SUTURE) ×1 IMPLANT
SUT VIC AB 0 CT1 8-18 (SUTURE) ×1
SUT VIC AB 2-0 CP2 18 (SUTURE) ×2 IMPLANT
SUT VIC AB 3-0 SH 8-18 (SUTURE) ×2 IMPLANT
SWAB CULTURE LIQ STUART DBL (MISCELLANEOUS) IMPLANT
SYR 20ML ECCENTRIC (SYRINGE) ×2 IMPLANT
TOWEL OR 17X24 6PK STRL BLUE (TOWEL DISPOSABLE) ×2 IMPLANT
TOWEL OR 17X26 10 PK STRL BLUE (TOWEL DISPOSABLE) ×2 IMPLANT
TUBE ANAEROBIC SPECIMEN COL (MISCELLANEOUS) IMPLANT
WATER STERILE IRR 1000ML POUR (IV SOLUTION) ×2 IMPLANT

## 2013-09-01 NOTE — Progress Notes (Signed)
Pt requested to go from chair to [facility]. Two RNs assisted. Gait belt in place. Pt began to stand and pivot. Unable to get legs to support while pivoting. RNs assisted pt to floor. No injury. Attempted to help pt up. Lift used d/t pt's inability to get legs under for support. Serosanguinous fluid noted from lumbar site. MD notified. Will cont to monitor.  While pt on floor, her eyes were fixed to upper left. She would not respond. MD notified of SZ lasting ~ 30 sec. Pt A& Ox4. Unaware of sz. Has never had sz before. Pt scared. Comforted by RN. Head CT ordered.

## 2013-09-01 NOTE — Progress Notes (Addendum)
Occupational Therapy Treatment Patient Details Name: Sherry Wong MRN: [id number] DOB: [birth year] Today's Date: 09/01/2013 Time: 4098-1191 OT Time Calculation (min): 27 min  OT Assessment / Plan / Recommendation  History of present illness s/p L4-5 laminectomy and facetecomty with pedical screws at L4, L5, S1. Pt then suffered from CSF leak post surgery and has been in bed x 7 days.   OT comments  Pt making steady progress. Began educating on AE today in conjunction to compensatory techniques. Pt very motivated to increase her level of independence. Anticipate D/C to CIR. Pt experiencing hyperesthesias BLE. Left sticky note to MD regarding managing this pharmocologically.  Follow Up Recommendations  CIR    Barriers to Discharge       Equipment Recommendations   (TBA at rehab)    Recommendations for Other Services Rehab consult  Frequency Min 2X/week   Progress towards OT Goals Progress towards OT goals: Progressing toward goals  Plan Discharge plan remains appropriate    Precautions / Restrictions Precautions Precautions: Back;Fall Precaution Comments: re educated on back precautions   Pertinent Vitals/Pain Vitals stable. C/o back pain during mobility    ADL  Lower Body Bathing: +1 Total assistance (educated on availability for AE for LB ADL) Toilet Transfer: +2 Total assistance Toilet Transfer: Patient Percentage: 30% Toilet Transfer Method: Stand pivot Toileting - Clothing Manipulation and Hygiene: +1 Total assistance (leans) Equipment Used: Gait belt Transfers/Ambulation Related to ADLs: +2 total assist pt 30 % ADL Comments: Pt unable to cross legs. will need to be trained to use AE    OT Diagnosis:    OT Problem List:   OT Treatment Interventions:     OT Goals(current goals can now be found in the care plan section) Acute Rehab OT Goals Patient Stated Goal: to get to where I can take care of myself OT Goal Formulation: With patient Time For Goal Achievement:  09/13/13 Potential to Achieve Goals: Good ADL Goals Pt Will Perform Grooming: with set-up;sitting Pt Will Perform Upper Body Bathing: with set-up;sitting Pt Will Perform Upper Body Dressing: with set-up;sitting Pt Will Transfer to Toilet: with mod assist;stand pivot transfer;bedside commode Additional ADL Goal #1: Pt will sit EOB independently x 10 minutes while performing UB ADL. Additional ADL Goal #2: Pt will state 3 of 3 back precautions.  Visit Information  Last OT Received On: 09/01/13 Assistance Needed: +2 History of Present Illness: s/p L4-5 laminectomy and facetecomty with pedical screws at L4, L5, S1. Pt then suffered from CSF leak post surgery and has been in bed x 7 days.    Subjective Data      Prior Functioning       Cognition  Cognition Arousal/Alertness: Awake/alert Behavior During Therapy: WFL for tasks assessed/performed Overall Cognitive Status: Within Functional Limits for tasks assessed    Mobility  Transfers Transfers: Sit to Stand;Stand to Sit Sit to Stand: 1: +2 Total assist;From chair/3-in-1 Sit to Stand: Patient Percentage: 40% Stand to Sit: 1: +2 Total assist;To chair/3-in-1 Details for Transfer Assistance: unable to achieve full upright posture. c/o back pain during transfer    Exercises  Other Exercises Other Exercises: B stretch into dorsiflexion with sheet   Balance Static Sitting Balance Static Sitting - Balance Support: Bilateral upper extremity supported;Feet supported Static Sitting - Level of Assistance: 5: Stand by assistance   End of Session OT - End of Session Equipment Utilized During Treatment: Gait belt Activity Tolerance: Patient tolerated treatment well Patient left: in chair;with call bell/phone within reach Nurse Communication: Mobility  status  GO     Sherry Wong,Sherry Wong 09/01/2013, 12:33 PM [facility], OTR/L  [phone] 09/01/2013

## 2013-09-01 NOTE — Anesthesia Preprocedure Evaluation (Addendum)
Anesthesia Evaluation  Patient identified by MRN, date of birth, ID band Patient awake    Reviewed: Allergy & Precautions, H&P , NPO status , Patient's Chart, lab work & pertinent test results  History of Anesthesia Complications (+) PONV and history of anesthetic complications  Airway Mallampati: II TM Distance: >3 FB Neck ROM: full    Dental  (+) Poor Dentition, Chipped, Missing and Dental Advisory Given   Pulmonary asthma , pneumonia -, resolved,    + wheezing      Cardiovascular hypertension, On Medications Rhythm:regular Rate:Normal     Neuro/Psych PSYCHIATRIC DISORDERS Anxiety Depression negative neurological ROS     GI/Hepatic Neg liver ROS, hiatal hernia, PUD, GERD-  Medicated and Controlled,  Endo/Other  Hypothyroidism Morbid obesity  Renal/GU negative Renal ROS  negative genitourinary   Musculoskeletal   Abdominal (+) + obese,   Peds  Hematology negative hematology ROS (+)   Anesthesia Other Findings See surgeon's H&P   Reproductive/Obstetrics negative OB ROS                         Anesthesia Physical Anesthesia Plan  ASA: II  Anesthesia Plan: General   Post-op Pain Management:    Induction: Intravenous  Airway Management Planned: Oral ETT  Additional Equipment:   Intra-op Plan:   Post-operative Plan: Extubation in OR  Informed Consent: I have reviewed the patients History and Physical, chart, labs and discussed the procedure including the risks, benefits and alternatives for the proposed anesthesia with the patient or authorized representative who has indicated his/her understanding and acceptance.     Plan Discussed with: CRNA and Surgeon  Anesthesia Plan Comments: (Albuterol Rx post intubation)       Anesthesia Quick Evaluation

## 2013-09-01 NOTE — Clinical Social Work Note (Signed)
Clinical Social Worker continuing to follow patient and family for support and discharge planning needs.  Patient has been accepted to inpatient rehab with plans to transfer today.  Patient in agreement.    Clinical Social Worker will sign off for now as social work intervention is no longer needed. Please consult [place] again if new need arises.  Macario Golds, [place] [phone]

## 2013-09-01 NOTE — Plan of Care (Signed)
Problem: Consults Goal: Diagnosis - Spinal Surgery Outcome: Completed/Met Date Met:  09/01/13 Thoraco/Lumbar Spine Fusion     

## 2013-09-01 NOTE — Progress Notes (Signed)
Patient ID: Sherry Wong, female   DOB: [birth year], 57 y.o.   MRN: [id number]  patient had i episode of seizure lasting 20 seconds, then back to normal. Later on checking the lumbar wound she had csf in the bed associated with headache. Once she was flat in bed the headache stopped and so the leak.  i presented the case to dr Danielle Dess who is on call and came with the best way                                     to help [name]. We decided to take her back to surgery and try to insert a lumbar drainage at the level of l23 and decide on the spot about look at the operative site. i did call her husband and talked to [employer]

## 2013-09-01 NOTE — Discharge Summary (Signed)
Physician Discharge Summary  Patient ID: Sherry Wong MRN: [id number] DOB/AGE: 12/06/1955 57 y.o.  Admit date: 08/25/2013 Discharge date: 09/01/2013  Admission Diagnoses:degenerative disc disease with chronic radiculopathies  Discharge Diagnoses:  Active Problems:   Lumbar degenerative disc disease   Morbid obesity   HTN (hypertension)   Hypoxemia   Unspecified hypothyroidism post op cayuda equina syndrome. Post op epidural hematoma  Discharged Condition: stable. Dense weakness distally both legs with bladder and bowels working  Hospital Course: surgery  Consults: CCM. Rehabilitation medicine  Significant Diagnostic Studies: myelogram  Treatments: l4 to S1 fusion followed by evacuation of epidural hematoma  Discharge Exam: Blood pressure 92/62, pulse 69, temperature 97.8 F (36.6 C), temperature source Oral, resp. rate 14, height 5\' 8"  (1.727 m), weight 116.9 kg (257 lb 11.5 oz), SpO2 99.00%. Control of bladder and bowels. Able to flex knees. Some flickering ot toes with some DF. SENSORY BETTER , C/O TINGLINING  Disposition: TO REHABILITATION. I WILL CONTINUE TO F/U HER WHILE IN REHAB     Medication List    ASK your doctor about these medications       albuterol 108 (90 BASE) MCG/ACT inhaler  Commonly known as:  PROVENTIL HFA;VENTOLIN HFA  Inhale 2 puffs into the lungs 2 (two) times daily.     citalopram 20 MG tablet  Commonly known as:  CELEXA  Take 20 mg by mouth daily.     diazepam 5 MG tablet  Commonly known as:  VALIUM  Take 5 mg by mouth every 8 (eight) hours as needed for muscle spasms.     HYDROcodone-acetaminophen 10-325 MG per tablet  Commonly known as:  NORCO  Take 1 tablet by mouth every 6 (six) hours as needed (for pain).     hyoscyamine 0.125 MG tablet  Commonly known as:  LEVSIN, ANASPAZ  Take 0.125 mg by mouth 3 (three) times daily.     levothyroxine 150 MCG tablet  Commonly known as:  SYNTHROID, LEVOTHROID  Take 150 mcg by mouth daily.      losartan-hydrochlorothiazide 50-12.5 MG per tablet  Commonly known as:  HYZAAR  Take 1 tablet by mouth daily.     pantoprazole 40 MG tablet  Commonly known as:  PROTONIX  Take 40 mg by mouth daily.         Signed: Karn Cassis 09/01/2013, 10:20 AM

## 2013-09-01 NOTE — PMR Pre-admission (Addendum)
PMR Admission Coordinator Pre-Admission Assessment  Patient: Sherry Wong is an 57 y.o., female MRN: [id number] DOB: 03-18-1956 Height: 5\' 8"  (172.7 cm) Weight: 112 kg (246 lb 14.6 oz)              Insurance Information HMO:      PPO: Yes     PCP:       IPA:       80/20:       OTHER:  Group # [id number] PRIMARY: BCBS out of state      Policy#: [id number]      Subscriber: Jearld Adjutant CM Name: Any RN      Phone#: [phone] opt 6, opt 2     Fax#: [phone] Pre-Cert#: [id number]  Precert X 7 days      Employer: [employer] Benefits:  Phone #: [phone]     Name: Thornton Papas. Date: 08/22/12     Deduct: $3000(met $1629.62)      Out of Pocket Max: $6350(met $1777.33)      Life Max: unlimited CIR: 80% w/auth  120 days max      SNF: 80% w/auth   60 days max Outpatient: 80%  20 visits combined     Co-Pay: 20% Home Health: 80%   20 visits combined      Co-Pay: 20% DME: 80%     Co-Pay: 20% Providers: in network  Emergency Contact Information Contact Information   Name Relation Home Work Mobile   Konecny,Gilmer Spouse [phone]       Current Medical History  Patient Admitting Diagnosis: Epidural hematoma after recent lumbar spine surgery, cauda equina syndrome   History of Present Illness: A 57 y.o. female with history of HTN, chronic back pain with radiation to BLE due to L4-5 and L5-S1 DDD. Patient elected to undergo L4-5 lam and L4/5 and L5/S1 disectomy with decompression of thecal sac on 08/25/13. Post op with paraparesis due to CSF leak and patient taken back to OR later that evening for exploration of wound with evacuation of epidural hematoma. Post op ABLA noted with hgb-8.6. On bedrest till 08/30/13 and now able to to flex knees, has flickering movement in toes and has had improvement in sensation BLE. Urine output improving and BP meds on hold due to hypotension.  On 12/03, had a 20-30 second seizure.  Noted to have headache and CSF leak.  Returned to OR on 12/03 for  wound exploration and lumbar drain placement.  Drain out on 12/06.  Continues with knees buckling and paraparesis.  Was lowered to her knees on 12/08 after difficulty getting back to bed.    Past Medical History  Past Medical History  Diagnosis Date  . PONV (postoperative nausea and vomiting)   . Hypertension     takes Losartan daily  . Cough   . Sinus drainage   . Pneumonia     hx of 15+yrs ago  . Chronic back pain     ddd/lumbago,and radiculopathy  . Arthritis   . Gout     hx of  . GERD (gastroesophageal reflux disease)     takes Protonix daily  . H/O hiatal hernia   . Gastric ulcer   . Ulcerative colitis   . IBS (irritable bowel syndrome)   . Constipation   . Diarrhea   . Hx of colonic polyps   . Urinary incontinence   . Nocturia   . Hypothyroidism     takes Levothyroxine daily  . Depression   . Anxiety  takes celexa daily    Family History  family history includes Anesthesia problems in her mother and sister. There is no history of Hypotension, Malignant hyperthermia, or Pseudochol deficiency.  Prior Rehab/Hospitalizations:  Had outpatient therapy 1 yr ago and had swimming pool therapy.   Current Medications  Current facility-administered medications:0.9 %  sodium chloride infusion, , Intravenous, Continuous, Karn Cassis, MD;  acetaminophen (TYLENOL) suppository 650 mg, 650 mg, Rectal, Q4H PRN, Karn Cassis, MD;  acetaminophen (TYLENOL) suppository 650 mg, 650 mg, Rectal, Q4H PRN, Karn Cassis, MD;  acetaminophen (TYLENOL) tablet 650 mg, 650 mg, Oral, Q4H PRN, Karn Cassis, MD, 650 mg at 08/27/13 1720 acetaminophen (TYLENOL) tablet 650 mg, 650 mg, Oral, Q4H PRN, Karn Cassis, MD;  albuterol (PROVENTIL HFA;VENTOLIN HFA) 108 (90 BASE) MCG/ACT inhaler 2 puff, 2 puff, Inhalation, BID, Karn Cassis, MD, 2 puff at 09/08/13 [phone];  albuterol (PROVENTIL HFA;VENTOLIN HFA) 108 (90 BASE) MCG/ACT inhaler 2 puff, 2 puff, Inhalation, Q4H PRN, Karn Cassis, MD, 2 puff at 09/05/13 [phone] albuterol (PROVENTIL) (5 MG/ML) 0.5% nebulizer solution 2.5 mg, 2.5 mg, Nebulization, Q3H PRN, Barnett Abu, MD, 2.5 mg at 09/03/13 2356;  bupivacaine liposome (EXPAREL) 1.3 % injection 266 mg, 20 mL, Infiltration, Once, Karn Cassis, MD;  citalopram (CELEXA) tablet 20 mg, 20 mg, Oral, Daily, Oretha Milch, MD, 20 mg at 09/07/13 1022;  dexamethasone (DECADRON) injection 4 mg, 4 mg, Intravenous, Q6H, Karn Cassis, MD, 4 mg at 09/02/13 0124 dexamethasone (DECADRON) tablet 4 mg, 4 mg, Oral, Q6H, Karn Cassis, MD, 4 mg at 09/08/13 0609;  diazepam (VALIUM) tablet 5 mg, 5 mg, Oral, Q6H PRN, Karn Cassis, MD, 5 mg at 09/08/13 0840;  docusate sodium (COLACE) capsule 200 mg, 200 mg, Oral, BID, Karn Cassis, MD, 200 mg at 09/07/13 2221 feeding supplement (RESOURCE BREEZE) (RESOURCE BREEZE) liquid 1 Container, 1 Container, Oral, TID BM, Heather Cornelison Pitts, RD, 1 Container at 09/07/13 2046;  fentaNYL (SUBLIMAZE) injection 50-100 mcg, 50-100 mcg, Intravenous, Once, Bedelia Person, MD;  heparin injection 5,000 Units, 5,000 Units, Subcutaneous, Q8H, Karn Cassis, MD, 5,000 Units at 09/08/13 7829 hydrALAZINE (APRESOLINE) injection 10-40 mg, 10-40 mg, Intravenous, Q4H PRN, Oretha Milch, MD;  hydrochlorothiazide (MICROZIDE) capsule 12.5 mg, 12.5 mg, Oral, Daily, Karn Cassis, MD, 12.5 mg at 09/05/13 1036;  HYDROcodone-acetaminophen (NORCO) 10-325 MG per tablet 1 tablet, 1 tablet, Oral, Q6H PRN, Karn Cassis, MD, 1 tablet at 09/08/13 0054 hyoscyamine (LEVSIN, ANASPAZ) tablet 0.125 mg, 0.125 mg, Oral, TID, Karn Cassis, MD, 0.125 mg at 09/07/13 2221;  levothyroxine (SYNTHROID, LEVOTHROID) tablet 150 mcg, 150 mcg, Oral, QAC breakfast, Oretha Milch, MD, 150 mcg at 09/08/13 0840;  losartan (COZAAR) tablet 50 mg, 50 mg, Oral, Daily, Karn Cassis, MD, 50 mg at 09/05/13 1036;  menthol-cetylpyridinium (CEPACOL) lozenge 3 mg, 1 lozenge, Oral, PRN, Karn Cassis, MD menthol-cetylpyridinium (CEPACOL) lozenge 3 mg, 1 lozenge, Oral, PRN, Karn Cassis, MD;  midazolam (VERSED) injection 1-2 mg, 1-2 mg, Intravenous, PRN, Bedelia Person, MD;  morphine 2 MG/ML injection 1-4 mg, 1-4 mg, Intravenous, Q3H PRN, Karn Cassis, MD, 2 mg at 09/06/13 0415;  ondansetron [facility]) injection 4 mg, 4 mg, Intravenous, Q4H PRN, Karn Cassis, MD, 4 mg at 09/02/13 0043 oxyCODONE-acetaminophen (PERCOCET/ROXICET) 5-325 MG per tablet 1-2 tablet, 1-2 tablet, Oral, Q4H PRN, Karn Cassis, MD, 2 tablet at 09/08/13 0841;  pantoprazole (PROTONIX) EC tablet 20 mg, 20 mg, Oral, Daily,  Carolan Clines, MD, 20 mg at 09/07/13 1022;  phenol (CHLORASEPTIC) mouth spray 1 spray, 1 spray, Mouth/Throat, PRN, Karn Cassis, MD;  phenol (CHLORASEPTIC) mouth spray 1 spray, 1 spray, Mouth/Throat, PRN, Karn Cassis, MD sodium phosphate (FLEET) 7-19 GM/118ML enema 1 enema, 1 enema, Rectal, Daily PRN, Karn Cassis, MD;  tamsulosin [facility]) capsule 0.8 mg, 0.8 mg, Oral, Daily, Karn Cassis, MD, 0.8 mg at 09/07/13 1023;  zolpidem (AMBIEN) tablet 5 mg, 5 mg, Oral, QHS PRN, Karn Cassis, MD  Patients Current Diet: General  Precautions / Restrictions Precautions Precautions: Back;Fall Precaution Booklet Issued: Yes (comment) Precaution Comments: Pt able to state 2/3 back precautions after education. Restrictions Weight Bearing Restrictions: No   Prior Activity Level Community (5-7x/wk): Went out daily  Journalist, newspaper / Equipment Home Assistive Devices/Equipment: None  Prior Functional Level Prior Function Level of Independence: Independent  Current Functional Level Cognition  Overall Cognitive Status: Within Functional Limits for tasks assessed Orientation Level: Oriented X4    Extremity Assessment (includes Sensation/Coordination)          ADLs  Eating/Feeding: Independent Where Assessed - Eating/Feeding: Chair Grooming: Wash/dry hands;Wash/dry  face;Teeth care;Supervision/safety Where Assessed - Grooming: Unsupported sitting Upper Body Bathing: Minimal assistance Where Assessed - Upper Body Bathing: Unsupported sitting Lower Body Bathing: +1 Total assistance (educated on availability for AE for LB ADL) Where Assessed - Lower Body Bathing: Unsupported sitting;Supported sit to stand Upper Body Dressing: Set up Where Assessed - Upper Body Dressing: Unsupported sitting Lower Body Dressing: +1 Total assistance Where Assessed - Lower Body Dressing: Unsupported sitting;Supported sit to stand Toilet Transfer: +2 Total assistance Toilet Transfer: Patient Percentage: 30% Toilet Transfer Method: Stand pivot Toileting - Clothing Manipulation and Hygiene: +1 Total assistance (leans) Equipment Used: Gait belt Transfers/Ambulation Related to ADLs: did not transfer pt today, worked on sit<>stand as precursor ADL Comments: Attempted to cross legs to access socks, will need AE.    Mobility  Bed Mobility: Rolling Left;Left Sidelying to Sit;Sit to Sidelying Left;Scooting to [facility] Rolling Right: 4: Min assist;With rail Rolling Left: 4: Min assist;With rail Right Sidelying to Sit: 4: Min assist Left Sidelying to Sit: 4: Min guard;HOB flat;With rails Sitting - Scoot to Edge of Bed: 3: Mod assist Sit to Sidelying Left: 3: Mod assist;HOB flat Scooting to HOB: 1: +2 Total assist Scooting to [facility]: Patient Percentage: 0%    Transfers  Transfers: Sit to Stand;Stand to Sit Sit to Stand: 1: +2 Total assist Sit to Stand: Patient Percentage: 50% Stand to Sit: 1: +2 Total assist Stand to Sit: Patient Percentage: 50% Stand Pivot Transfers: 1: +2 Total assist Stand Pivot Transfers: Patient Percentage: 40%    Ambulation / Gait / Stairs / Wheelchair Mobility  Ambulation/Gait Ambulation/Gait Assistance: Not tested (comment)    Posture / Balance Static Sitting Balance Static Sitting - Balance Support: Bilateral upper extremity supported;Feet  supported Static Sitting - Level of Assistance: 5: Stand by assistance Static Sitting - Comment/# of Minutes: 10 Dynamic Sitting Balance Dynamic Sitting - Balance Support: Feet supported;No upper extremity supported Dynamic Sitting - Level of Assistance: 5: Stand by assistance Dynamic Sitting - Balance Activities: Forward lean/weight shifting (UE bathing)    Special needs/care consideration BiPAP/CPAP No CPM No Continuous Drip IV0.9% NS 100 ml/hr Dialysis No       Life Vest No Oxygen No Special Bed No Trach Size No Wound Vac (area) No     Skin No  Bowel mgmt: Had BM 09/01/13 Bladder mgmt: Voiding on bedpan.  Catheter removed 08/31/13 Diabetic mgmt No    Previous Home Environment Living Arrangements: Spouse/significant other Home Care Services: No Additional Comments: pt lives in 1 story home with 5 steps to enter with bilat HR. Pt  was indep PTA and was not using any DME.   Discharge Living Setting Plans for Discharge Living Setting: Patient's home;House;Lives with (comment) (Lives with husband.) Type of Home at Discharge: House Discharge Home Layout: One level Discharge Home Access: Stairs to enter Entrance Stairs-Number of Steps: 4 steps front entry Does the patient have any problems obtaining your medications?: No  Social/Family/Support Systems Patient Roles: Spouse Contact Information: Alegra Rost - spouse Anticipated Caregiver: Husband Anticipated Caregiver's Contact Information: Benay Pike - husband (c) [phone] Ability/Limitations of Caregiver: Husband can assist and is on disability.  Husband has a leaking heart valve, but he plays golf. Caregiver Availability: 24/7 Discharge Plan Discussed with Primary Caregiver: Yes Is Caregiver In Agreement with Plan?: Yes Does Caregiver/Family have Issues with Lodging/Transportation while Pt is in Rehab?: No  Goals/Additional Needs Patient/Family Goal for Rehab: PT mod I W/C, OT mod I to min  assist, no ST needs Expected length of stay: 20-25 days Cultural Considerations: [employer] Dietary Needs: Regular diet with thin liquids Equipment Needs: TBD Pt/Family Agrees to Admission and willing to participate: Yes Program Orientation Provided & Reviewed with Pt/Caregiver Including Roles  & Responsibilities: Yes   Decrease burden of Care through IP rehab admission: N/A  Possible need for SNF placement upon discharge: Not planned   Patient Condition: This patient's condition has changed since the consult dated 08/30/13, in which the Rehabilitation Physician determined and documented that the patient's condition is appropriate for intensive rehabilitative care in an inpatient rehabilitation facility. See above History of Present Illness for update.  Is participating with therapies and is motivated.  Is now stable for admission to inpatient rehab Will admit to inpatient rehab today.  Preadmission Screen Completed By:  Trish Mage, 09/08/2013 9:56 AM ______________________________________________________________________   Discussed status with Dr. Riley Kill on 09/08/13 at [phone] and received telephone approval for admission today.  Admission Coordinator:  Trish Mage, time0955/Date 09/08/13

## 2013-09-01 NOTE — Anesthesia Procedure Notes (Signed)
Procedure Name: Intubation Date/Time: 09/01/2013 8:47 PM Performed by: Luster Landsberg Pre-anesthesia Checklist: Patient identified, Emergency Drugs available, Suction available and Patient being monitored Patient Re-evaluated:Patient Re-evaluated prior to inductionOxygen Delivery Method: Circle system utilized Preoxygenation: Pre-oxygenation with 100% oxygen Intubation Type: IV induction, Rapid sequence and Cricoid Pressure applied Laryngoscope Size: Mac and 3 Grade View: Grade I Tube type: Oral Tube size: 7.5 mm Number of attempts: 1 Airway Equipment and Method: Stylet Placement Confirmation: ETT inserted through vocal cords under direct vision,  positive ETCO2 and breath sounds checked- equal and bilateral Secured at: 21 cm Tube secured with: Tape Dental Injury: Teeth and Oropharynx as per pre-operative assessment

## 2013-09-01 NOTE — Plan of Care (Signed)
Problem: Consults Goal: Diagnosis - Spinal Surgery Thoraco/Lumbar Spine Fusion     

## 2013-09-01 NOTE — Progress Notes (Signed)
Rehab admissions - I met with patient and her husband yesterday.  I have authorization from [facility] for acute inpatient rehab admission for today.  I spoke with Dr. Jeral Fruit who says patient is medically ready for inpatient rehab.  Bed available and can admit to acute inpatient rehab today.  Call me for questions.  #[phone]

## 2013-09-01 NOTE — Progress Notes (Signed)
PULMONARY  / CRITICAL CARE MEDICINE  Name: Sherry Wong MRN: [id number] DOB: 06/14/1956    ADMISSION DATE:  08/25/2013 CONSULTATION DATE:  08/26/2013  REFERRING MD :  Dr. Jeral Fruit PRIMARY SERVICE: Neuro surgery  CHIEF COMPLAINT:  Medical management  BRIEF PATIENT DESCRIPTION: 57 year old female with hypothyroidism, asthma, GERD  who underwent a lumbar surgery subsequently suffered CSF leak.  Patient was taken back to the OR for exploration and is to be kept at 5 degree angle inorder to avoid further leak.  PCCM was consulted to address concern for PE/DVT prophylaxis as well as general medical management.  SIGNIFICANT EVENTS / STUDIES:  11/26 Decompression and fusion at l45, l5 S1. 11/27 re-exploration of surgical site for concern for CSF leak.  LINES / TUBES: PIV  CULTURES: None  ANTIBIOTICS: None  SUBJECTIVE: C/o pain at surgical site.  C/o wheezing.  VITAL SIGNS: Temp:  [97.7 F (36.5 C)-97.8 F (36.6 C)] 97.8 F (36.6 C) (12/03 0800) Pulse Rate:  [52-80] 69 (12/03 0800) Resp:  [8-19] 14 (12/03 0800) BP: (92-140)/(47-80) 92/62 mmHg (12/03 0800) SpO2:  [94 %-100 %] 98 % (12/03 1044) Weight:  [116.9 kg (257 lb 11.5 oz)] 116.9 kg (257 lb 11.5 oz) (12/03 0500) HEMODYNAMICS:   VENTILATOR SETTINGS:   INTAKE / OUTPUT: Intake/Output     12/02 0701 - 12/03 0700 12/03 0701 - 12/04 0700   P.O. 1200 240   I.V. (mL/kg) 382.5 (3.3)    Total Intake(mL/kg) 1582.5 (13.5) 240 (2.1)   Urine (mL/kg/hr) 505 (0.2)    Total Output 505     Net +1077.5 +240        Urine Occurrence 5 x 2 x   Stool Occurrence  1 x    PHYSICAL EXAMINATION: General:  NAD. Neuro:  Alert and interactive, follows command, flickering of movement  Of toes HEENT:  [place]/AT, PERRL, EOM-I and MMM. Cardiovascular:  RRR, Nl S1/S2, -M/R/G. Lungs:  Decreased BS diffusely. Abdomen:  Obese, soft, ND, ND and +BS. Musculoskeletal:  Bilateral lower ext weakness 3/5, -edema and -tenderness. Skin:  Intact, wound  clean.  LABS:  CBC  Recent Labs Lab 08/27/13 0400 08/28/13 0426 08/30/13 0412  WBC 11.6* 6.4 4.7  HGB 9.4* 8.5* 8.6*  HCT 27.2* 25.0* 25.5*  PLT 147* 133* 177   Coag's No results found for this basename: APTT, INR,  in the last 168 hours BMET  Recent Labs Lab 08/27/13 0750 08/28/13 0426 08/30/13 0412  NA 142 144 143  K 4.2 4.0 3.9  CL 107 108 107  CO2 26 29 28   BUN 14 16 24*  CREATININE 0.64 0.60 0.69  GLUCOSE 139* 135* 125*   Electrolytes  Recent Labs Lab 08/26/13 1830 08/27/13 0400 08/27/13 0750 08/28/13 0426 08/30/13 0412  CALCIUM 8.5 8.5 8.3* 8.3* 8.2*  MG 1.9 2.1  --   --  2.4  PHOS 2.5 2.5  --   --  4.1   Sepsis Markers No results found for this basename: LATICACIDVEN, PROCALCITON, O2SATVEN,  in the last 168 hours ABG No results found for this basename: PHART, PCO2ART, PO2ART,  in the last 168 hours Liver Enzymes  Recent Labs Lab 08/27/13 0750  AST 32  ALT 21  ALKPHOS 52  BILITOT 0.4  ALBUMIN 3.0*   Cardiac Enzymes No results found for this basename: TROPONINI, PROBNP,  in the last 168 hours Glucose No results found for this basename: GLUCAP,  in the last 168 hours  Imaging No results found.  CXR: None  ASSESSMENT / PLAN:  PULMONARY A: Hypoxemia, likely OSA. Asthma P:   - Titrate O2 for sat of 88-92%.- Will likely need a sleep study as outpatient. - Albuterol nebs prn. - To avoid DVT/PE will continue heparin SQ and place SCD's. - IS per RT protocol.  CARDIOVASCULAR A: Tachycardia, sinus.  Due to pain.  HTN history. P:  - Pain control - per NS. - Monitor. - Hold anti-HTN (on ARB and HCTZ at home), can resume on discharge if BP rises stable for now. - Use hydralazine prn.  RENAL A:  Oliguria  P:   - Improving UO.  Monitor for now. - Recommend d/c of IVF but will defer to neuro.  ENDOCRINE A:  Hypothyroidism by history.  On steroids. P:   - Check TSH. - Continue home thyroxin. - Continue  decadron.  NEUROLOGIC A:  Post op. P:   - Per NS. - Pain management.  Bed available in CIR.  Ok to transfer from a [employer].  PCCM will sign off, please call back if needed.  Alyson Reedy, M.D. [facility] Pulmonary/Critical Care Medicine. Pager: [phone]. After hours pager: [phone].

## 2013-09-01 NOTE — Progress Notes (Signed)
Physical Therapy Treatment Patient Details Name: Sherry Wong MRN: [id number] DOB: 05-28-1956 Today's Date: 09/01/2013 Time: 4098-1191 PT Time Calculation (min): 28 min  PT Assessment / Plan / Recommendation  History of Present Illness s/p L4-5 laminectomy and facetecomty with pedical screws at L4, L5, S1. Pt then suffered from CSF leak post surgery and has been in bed x 7 days.   PT Comments   Pt con't to require maximal assist however demoing improved R LE strength. Pt to con't to benefit from CIR upon d/c.  Follow Up Recommendations  CIR     Does the patient have the potential to tolerate intense rehabilitation     Barriers to Discharge        Equipment Recommendations       Recommendations for Other Services Rehab consult  Frequency Min 5X/week   Progress towards PT Goals Progress towards PT goals: Progressing toward goals  Plan Current plan remains appropriate    Precautions / Restrictions Precautions Precautions: Back;Fall Precaution Comments: re educated on back precautions Restrictions Weight Bearing Restrictions: No   Pertinent Vitals/Pain 8/10 R LE pain with weight-bearing    Mobility  Bed Mobility Bed Mobility: Rolling Right;Right Sidelying to Sit;Sitting - Scoot to [place] of Bed Rolling Right: 4: Min assist;With rail Right Sidelying to Sit: 4: Min assist Sitting - Scoot to [place] of Bed: 3: Mod assist Details for Bed Mobility Assistance: pt with improved ability to move LEs Transfers Transfers: Sit to Stand;Stand to Sit;Stand Pivot Transfers Sit to Stand: 1: +2 Total assist Sit to Stand: Patient Percentage: 60% Stand to Sit: 1: +2 Total assist;To chair/3-in-1 Stand to Sit: Patient Percentage: 30% Stand Pivot Transfers: 1: +2 Total assist Stand Pivot Transfers: Patient Percentage: 40% Details for Transfer Assistance: attempted to take steps but unable this date due to onset of R LE pain Ambulation/Gait Ambulation/Gait Assistance: Not tested (comment)     Exercises Other Exercises Other Exercises: attempted marching in place several times hwoever unable despite maximal assist   PT Diagnosis:    PT Problem List:   PT Treatment Interventions:     PT Goals (current goals can now be found in the care plan section)    Visit Information  Last PT Received On: 09/01/13 Assistance Needed: +2 History of Present Illness: s/p L4-5 laminectomy and facetecomty with pedical screws at L4, L5, S1. Pt then suffered from CSF leak post surgery and has been in bed x 7 days.    Subjective Data  Subjective: pt request to use [facility]   Cognition  Cognition Arousal/Alertness: Awake/alert Behavior During Therapy: WFL for tasks assessed/performed Overall Cognitive Status: Within Functional Limits for tasks assessed    Balance     End of Session PT - End of Session Equipment Utilized During Treatment: Gait belt Activity Tolerance: Patient tolerated treatment well Patient left: in chair;with call bell/phone within reach;with family/visitor present Nurse Communication: Mobility status;Need for lift equipment   GP     [name] 09/01/2013, 5:36 PM  Lewis Shock, PT, DPT Pager #: [phone] Office #: [phone]

## 2013-09-02 ENCOUNTER — Inpatient Hospital Stay (HOSPITAL_COMMUNITY): Payer: BC Managed Care – PPO | Admitting: Physical Therapy

## 2013-09-02 LAB — CBC
HCT: 23.7 % — ABNORMAL LOW (ref 36.0–46.0)
Hemoglobin: 8.2 g/dL — ABNORMAL LOW (ref 12.0–15.0)
MCH: 30.7 pg (ref 26.0–34.0)
MCHC: 34.6 g/dL (ref 30.0–36.0)
MCV: 88.8 fL (ref 78.0–100.0)
Platelets: 198 10*3/uL (ref 150–400)
RBC: 2.67 MIL/uL — ABNORMAL LOW (ref 3.87–5.11)
RDW: 14.2 % (ref 11.5–15.5)
WBC: 10.2 10*3/uL (ref 4.0–10.5)

## 2013-09-02 LAB — CREATININE, SERUM
Creatinine, Ser: 0.74 mg/dL (ref 0.50–1.10)
GFR calc Af Amer: >90 mL/min (ref >90)
GFR calc non Af Amer: >90 mL/min (ref >90)

## 2013-09-02 MED ORDER — CEFAZOLIN SODIUM-DEXTROSE 2-3 GM-% IV SOLR
2.0000 g | Freq: Three times a day (TID) | INTRAVENOUS | Status: AC
  Administered 2013-09-02 (×2): 2 g via INTRAVENOUS
  Filled 2013-09-02 (×3): qty 50

## 2013-09-02 MED ORDER — MENTHOL 3 MG MT LOZG: 1.0000 | LOZENGE | OROMUCOSAL | Status: DC | PRN

## 2013-09-02 MED ORDER — SODIUM CHLORIDE 0.9 % IJ SOLN
3.0000 mL | Freq: Two times a day (BID) | INTRAMUSCULAR | Status: DC
  Administered 2013-09-02: 3 mL via INTRAVENOUS

## 2013-09-02 MED ORDER — BIOTENE DRY MOUTH MT LIQD
15.0000 mL | Freq: Two times a day (BID) | OROMUCOSAL | Status: DC
  Administered 2013-09-02 – 2013-09-06 (×9): 15 mL via OROMUCOSAL

## 2013-09-02 MED ORDER — ZOLPIDEM TARTRATE 5 MG PO TABS: 5.0000 mg | ORAL_TABLET | Freq: Every evening | ORAL | Status: DC | PRN

## 2013-09-02 MED ORDER — DIAZEPAM 5 MG PO TABS
5.0000 mg | ORAL_TABLET | Freq: Four times a day (QID) | ORAL | Status: DC | PRN
  Administered 2013-09-02 – 2013-09-08 (×8): 5 mg via ORAL
  Filled 2013-09-02 (×8): qty 1

## 2013-09-02 MED ORDER — HEPARIN SODIUM (PORCINE) 5000 UNIT/ML IJ SOLN
5000.0000 [IU] | Freq: Three times a day (TID) | INTRAMUSCULAR | Status: DC
  Administered 2013-09-02 – 2013-09-08 (×19): 5000 [IU] via SUBCUTANEOUS
  Filled 2013-09-02 (×22): qty 1

## 2013-09-02 MED ORDER — MIDAZOLAM HCL 2 MG/2ML IJ SOLN: 1.0000 mg | INTRAMUSCULAR | Status: DC | PRN

## 2013-09-02 MED ORDER — PHENOL 1.4 % MT LIQD: 1.0000 | OROMUCOSAL | Status: DC | PRN

## 2013-09-02 MED ORDER — MORPHINE SULFATE 2 MG/ML IJ SOLN
1.0000 mg | INTRAMUSCULAR | Status: DC | PRN
  Administered 2013-09-02 (×2): 2 mg via INTRAVENOUS
  Administered 2013-09-02: 4 mg via INTRAVENOUS
  Administered 2013-09-02: 2 mg via INTRAVENOUS
  Administered 2013-09-02: 4 mg via INTRAVENOUS
  Administered 2013-09-02 – 2013-09-03 (×2): 2 mg via INTRAVENOUS
  Administered 2013-09-03 (×2): 4 mg via INTRAVENOUS
  Administered 2013-09-03 – 2013-09-04 (×2): 2 mg via INTRAVENOUS
  Administered 2013-09-04 (×2): 4 mg via INTRAVENOUS
  Administered 2013-09-04 – 2013-09-06 (×4): 2 mg via INTRAVENOUS
  Filled 2013-09-02 (×2): qty 2
  Filled 2013-09-02 (×4): qty 1
  Filled 2013-09-02: qty 2
  Filled 2013-09-02 (×3): qty 1
  Filled 2013-09-02: qty 2
  Filled 2013-09-02 (×2): qty 1
  Filled 2013-09-02 (×2): qty 2
  Filled 2013-09-02 (×2): qty 1

## 2013-09-02 MED ORDER — BOOST / RESOURCE BREEZE PO LIQD
1.0000 | Freq: Three times a day (TID) | ORAL | Status: DC
  Administered 2013-09-02 – 2013-09-08 (×13): 1 via ORAL

## 2013-09-02 MED ORDER — SODIUM CHLORIDE 0.9 % IV SOLN: 250.0000 mL | INTRAVENOUS | Status: DC

## 2013-09-02 MED ORDER — SODIUM CHLORIDE 0.9 % IV SOLN
INTRAVENOUS | Status: DC
  Administered 2013-09-02 – 2013-09-06 (×6): via INTRAVENOUS

## 2013-09-02 MED ORDER — ACETAMINOPHEN 650 MG RE SUPP: 650.0000 mg | RECTAL | Status: DC | PRN

## 2013-09-02 MED ORDER — DEXAMETHASONE 4 MG PO TABS
4.0000 mg | ORAL_TABLET | Freq: Four times a day (QID) | ORAL | Status: DC
  Administered 2013-09-02 – 2013-09-08 (×26): 4 mg via ORAL
  Filled 2013-09-02 (×28): qty 1

## 2013-09-02 MED ORDER — ONDANSETRON HCL 4 MG/2ML IJ SOLN
INTRAMUSCULAR | Status: AC
  Filled 2013-09-02: qty 2

## 2013-09-02 MED ORDER — SODIUM CHLORIDE 0.9 % IJ SOLN: 3.0000 mL | INTRAMUSCULAR | Status: DC | PRN

## 2013-09-02 MED ORDER — ACETAMINOPHEN 325 MG PO TABS: 650.0000 mg | ORAL_TABLET | ORAL | Status: DC | PRN

## 2013-09-02 MED ORDER — OXYCODONE-ACETAMINOPHEN 5-325 MG PO TABS
1.0000 | ORAL_TABLET | ORAL | Status: DC | PRN
  Administered 2013-09-02 – 2013-09-03 (×8): 2 via ORAL
  Administered 2013-09-04: 1 via ORAL
  Administered 2013-09-04 – 2013-09-08 (×15): 2 via ORAL
  Filled 2013-09-02 (×7): qty 2
  Filled 2013-09-02: qty 1
  Filled 2013-09-02 (×17): qty 2

## 2013-09-02 MED ORDER — FENTANYL CITRATE 0.05 MG/ML IJ SOLN: 50.0000 ug | Freq: Once | INTRAMUSCULAR | Status: DC

## 2013-09-02 MED ORDER — DEXAMETHASONE SODIUM PHOSPHATE 4 MG/ML IJ SOLN
4.0000 mg | Freq: Four times a day (QID) | INTRAMUSCULAR | Status: DC
  Administered 2013-09-02: 4 mg via INTRAVENOUS
  Filled 2013-09-02 (×29): qty 1

## 2013-09-02 MED ORDER — ONDANSETRON HCL 4 MG/2ML IJ SOLN
4.0000 mg | INTRAMUSCULAR | Status: DC | PRN
  Administered 2013-09-02: 4 mg via INTRAVENOUS

## 2013-09-02 NOTE — Progress Notes (Signed)
Patient ID: Sherry Wong, female   DOB: 04/28/1956, 57 y.o.   MRN: [id number] Resting, no headache. Neuro stable. Found her with hob up. Needs to be 5 degrees down. Lumbar catheter working

## 2013-09-02 NOTE — Progress Notes (Signed)
PT Cancellation Note  Patient Details Name: Sherry Wong MRN: [id number] DOB: [birth year]   Cancelled Treatment:    Reason Eval/Treat Not Completed: Medical issues which prohibited therapy (Pt placed back on bed rest s/p lumbar wound debridement and lumbar drain placement. )   Elam Ellis 09/02/2013, 8:20 AM  Jake Shark, PT DPT [phone]

## 2013-09-02 NOTE — Anesthesia Postprocedure Evaluation (Signed)
  Anesthesia Post-op Note  Patient: Sherry Wong  Procedure(s) Performed: Procedure(s): PLACEMENT OF LUMBAR DRAIN (N/A) LUMBAR WOUND DEBRIDEMENT (N/A)  Patient Location: ICU  Anesthesia Type:General  Level of Consciousness: awake  Airway and Oxygen Therapy: Patient Spontanous Breathing  Post-op Pain: mild  Post-op Assessment: Post-op Vital signs reviewed, Patient's Cardiovascular Status Stable, Respiratory Function Stable, Patent Airway, No signs of Nausea or vomiting and Pain level controlled  Post-op Vital Signs: Reviewed and stable  Complications: No apparent anesthesia complications

## 2013-09-02 NOTE — Progress Notes (Signed)
Pt's HOB at -5 degrees.  Read through orders stating HOB down to 5 degrees; put HOB at 5 degrees.  Will clarify with Dr. Jeral Fruit when he comes around.

## 2013-09-02 NOTE — Progress Notes (Signed)
OP NOTE [phone]

## 2013-09-02 NOTE — Transfer of Care (Signed)
Immediate Anesthesia Transfer of Care Note  Patient: Sherry Wong  Procedure(s) Performed: Procedure(s): PLACEMENT OF LUMBAR DRAIN (N/A) LUMBAR WOUND DEBRIDEMENT (N/A)  Patient Location: NICU  Anesthesia Type:General  Level of Consciousness: awake, alert  and oriented  Airway & Oxygen Therapy: Patient Spontanous Breathing and Patient connected to nasal cannula oxygen  Post-op Assessment: Report given to PACU RN and Post -op Vital signs reviewed and stable  Post vital signs: Reviewed and stable  Complications: No apparent anesthesia complications

## 2013-09-02 NOTE — Progress Notes (Signed)
Dr. Danielle Dess paged and made aware that drain has not been putting out the wanted 10 cc per hour by Dr. Jeral Fruit.  The dressing is clean, dry and intact with no signs of leakage. Patient remains neuro intact and vital signs are stable. No new changes at this time. No new orders from Dr. Danielle Dess but will continue to monitor drainage. Jacqulynn Cadet

## 2013-09-02 NOTE — Progress Notes (Signed)
INITIAL NUTRITION ASSESSMENT  DOCUMENTATION CODES Per approved criteria  -Obesity Unspecified   INTERVENTION: Resource Breeze po TID, each supplement provides 250 kcal and 9 grams of protein.  NUTRITION DIAGNOSIS: Inadequate oral intake related to decreased appetite as evidenced by po < 25%.   Goal: Pt to meet >/= 90% of their estimated nutrition needs   Monitor:  Diet advancement, PO intake, supplement acceptance, weight trend, labs  Reason for Assessment: Poor PO  57 y.o. female  Admitting Dx: <principal problem not specified>  ASSESSMENT: Pt admitted 11/26 and is s/p L4-5 laminectomy and facetectomy and left L4-L5, L5-S1 diskectomy 11/27. Pt then had exploration of the lumbar wound with evacuation of epidural hematoma 11/27. Pt with lumbar catheter in place and must be kept 5 degrees down.  Pt reports that she has been mostly on clear liquids with very little intake. She did take some of a regular diet for a few meals but very little. Pt eats very little animal protein per hx. She states that she is very picky. She eats mostly vegetables. She will eat some chicken and an omelet every once in a while. She does like beans, peanut butter, and cheese. She does not drink milk. Pt has been drinking protein shakes mixed with almond milk PTA.  Gave pt a few sips of Resource Breeze, pt is willing to sip on this during the day. Pt cannot drink very much at one time due to being inverted in the bed.  Potassium low.   Nutrition Focused Physical Exam:  Subcutaneous Fat:  Orbital Region: WNL Upper Arm Region: WNL Thoracic and Lumbar Region: WNL  Muscle:  Temple Region: WNL Clavicle Bone Region: WNL Clavicle and Acromion Bone Region: WNL Scapular Bone Region: WNL Dorsal Hand: WNL Patellar Region: WNL Anterior Thigh Region: WNL Posterior Calf Region: WNL  Edema: not present   Height: Ht Readings from Last 1 Encounters:  08/26/13 5\' 8"  (1.727 m)    Weight: Wt Readings from  Last 1 Encounters:  09/01/13 257 lb 11.5 oz (116.9 kg)    Ideal Body Weight: 63.6 kg   % Ideal Body Weight: 184%  Wt Readings from Last 10 Encounters:  09/01/13 257 lb 11.5 oz (116.9 kg)  09/01/13 257 lb 11.5 oz (116.9 kg)  09/01/13 257 lb 11.5 oz (116.9 kg)  09/01/13 257 lb 11.5 oz (116.9 kg)  08/18/13 240 lb (108.863 kg)    Usual Body Weight: 240 lb   % Usual Body Weight: >100%  BMI:  Body mass index is 39.19 kg/(m^2).  Estimated Nutritional Needs: Kcal: 1900-2100 Protein: 100-115 grams Fluid: > 1.9 L/day  Skin: back incision  Diet Order: Clear Liquid Meal Completion: <25%  EDUCATION NEEDS: -No education needs identified at this time   Intake/Output Summary (Last 24 hours) at 09/02/13 1249 Last data filed at 09/02/13 1200  Gross per 24 hour  Intake 3566.67 ml  Output   4255 ml  Net -688.33 ml    Last BM: 12/3   Labs:   Recent Labs Lab 08/26/13 1830 08/27/13 0400  08/28/13 0426 08/30/13 0412 09/01/13 1615 09/02/13 0130  NA 141 141  < > 144 143 141  --   K 4.1 4.3  < > 4.0 3.9 2.7*  --   CL 108 107  < > 108 107 105  --   CO2 26 29  < > 29 28 27   --   BUN 10 13  < > 16 24* 22  --   CREATININE 0.66 0.64  < >  0.60 0.69 0.68 0.74  CALCIUM 8.5 8.5  < > 8.3* 8.2* 7.8*  --   MG 1.9 2.1  --   --  2.4  --   --   PHOS 2.5 2.5  --   --  4.1  --   --   GLUCOSE 135* 142*  < > 135* 125* 169*  --   < > = values in this interval not displayed.  CBG (last 3)   Recent Labs  09/01/13 1424  GLUCAP 130*    Scheduled Meds: . albuterol  2 puff Inhalation BID  . antiseptic oral rinse  15 mL Mouth Rinse BID  . bupivacaine liposome  20 mL Infiltration Once  . citalopram  20 mg Oral Daily  . dexamethasone  4 mg Intravenous Q6H   Or  . dexamethasone  4 mg Oral Q6H  . docusate sodium  200 mg Oral BID  . fentaNYL  50-100 mcg Intravenous Once  . heparin subcutaneous  5,000 Units Subcutaneous Q8H  . hydrochlorothiazide  12.5 mg Oral Daily  . hyoscyamine  0.125  mg Oral TID  . levothyroxine  150 mcg Oral QAC breakfast  . losartan  50 mg Oral Daily  . pantoprazole  20 mg Oral Daily  . tamsulosin  0.8 mg Oral Daily    Continuous Infusions: . sodium chloride 100 mL/hr at 09/02/13 1000    Past Medical History  Diagnosis Date  . PONV (postoperative nausea and vomiting)   . Hypertension     takes Losartan daily  . Cough   . Sinus drainage   . Pneumonia     hx of 15+yrs ago  . Chronic back pain     ddd/lumbago,and radiculopathy  . Arthritis   . Gout     hx of  . GERD (gastroesophageal reflux disease)     takes Protonix daily  . H/O hiatal hernia   . Gastric ulcer   . Ulcerative colitis   . IBS (irritable bowel syndrome)   . Constipation   . Diarrhea   . Hx of colonic polyps   . Urinary incontinence   . Nocturia   . Hypothyroidism     takes Levothyroxine daily  . Depression   . Anxiety     takes celexa daily    Past Surgical History  Procedure Laterality Date  . Cysts removed from ovary    . Cholecystectomy    . Dilation and curettage of uterus    . Breast surgery      left breast lumpectomy  . Breast biopsy      right  . Colonoscopy    . Esophagogastroduodenoscopy    . Lumbar laminectomy/decompression microdiscectomy  12/03/2011    Procedure: LUMBAR LAMINECTOMY/DECOMPRESSION MICRODISCECTOMY 2 LEVELS;  Surgeon: Karn Cassis, MD;  Location: [facility];  Service: Neurosurgery;  Laterality: Right;  Right Lumbar four-five Lumbar five sacral one Foraminotomies  . Transforaminal lumbar interbody fusion (tlif) with pedicle screw fixation 2 level N/A 08/25/2013    Procedure:  Lumbar four-five, Lumbar five-Sacral one Transforaminal Lumbar Interbody Fusion ;  Surgeon: Karn Cassis, MD;  Location: [facility];  Service: Neurosurgery;  Laterality: N/A;  . Hematoma evacuation N/A 08/25/2013    Procedure: EVACUATION OF LUMBAR HEMATOMA;  Surgeon: Karn Cassis, MD;  Location: [facility];  Service: Neurosurgery;  Laterality:  N/A;    Kendell Bane RD, LDN, CNSC [phone] Pager [phone] After Hours Pager

## 2013-09-03 ENCOUNTER — Encounter (HOSPITAL_COMMUNITY): Payer: Self-pay | Admitting: Neurosurgery

## 2013-09-03 LAB — BASIC METABOLIC PANEL
BUN: 12 mg/dL (ref 6–23)
CO2: 30 mEq/L (ref 19–32)
Calcium: 7.8 mg/dL — ABNORMAL LOW (ref 8.4–10.5)
Chloride: 105 mEq/L (ref 96–112)
Creatinine, Ser: 0.7 mg/dL (ref 0.50–1.10)
GFR calc Af Amer: >90 mL/min (ref >90)
GFR calc non Af Amer: >90 mL/min (ref >90)
Glucose, Bld: 99 mg/dL (ref 70–99)
Potassium: 4 mEq/L (ref 3.5–5.1)
Sodium: 141 mEq/L (ref 135–145)

## 2013-09-03 NOTE — Progress Notes (Signed)
Pt. Complains of wheezing and SOB.  Called RT to give breathing treatment.  Treatment given and pt's wheezing subsided and no other complaints at this time.  Will continue to monitor.

## 2013-09-03 NOTE — Progress Notes (Signed)
UR completed.  Pt had been accepted to CIR prior to seizure on 12/3.  Hopefully when medically stable, she will once again qualify for CIR transfer.   Carlyle Lipa, RN BSN [facility] CCM Trauma/Neuro ICU Case Manager [phone]

## 2013-09-03 NOTE — Progress Notes (Signed)
Patient ID: Sherry Wong, female   DOB: [birth year], 57 y.o.   MRN: [id number] Stable, no headache. Better flexion both knees. Perineal sensation normal. Rectal tone present. Lumbar catheter was advanced but still no free flow but only at insertion side . Wound dry and clean. To avoid csf recurrence we agreed for her to stay flat till this Monday. She ok the plan.

## 2013-09-04 MED ORDER — ALBUTEROL SULFATE HFA 108 (90 BASE) MCG/ACT IN AERS
2.0000 | INHALATION_SPRAY | RESPIRATORY_TRACT | Status: DC | PRN
  Administered 2013-09-04 – 2013-09-05 (×2): 2 via RESPIRATORY_TRACT
  Filled 2013-09-04: qty 6.7

## 2013-09-04 NOTE — Progress Notes (Signed)
Subjective: Patient reports "hanging in there"  Objective: Vital signs in last 24 hours: Temp:  [98 F (36.7 C)-98.2 F (36.8 C)] 98.2 F (36.8 C) (12/06 0400) Pulse Rate:  [60-87] 78 (12/06 0900) Resp:  [9-29] 26 (12/06 0900) BP: (90-132)/(54-94) 105/73 mmHg (12/06 0900) SpO2:  [98 %-100 %] 98 % (12/06 0900) FiO2 (%):  [28 %] 28 % (12/05 1930)  Intake/Output from previous day: 12/05 0701 - 12/06 0700 In: 2740 [P.O.:340; I.V.:2400] Out: 3185 [Urine:3165; Drains:20] Intake/Output this shift: Total I/O In: 640 [P.O.:340; I.V.:300] Out: 350 [Urine:350]  Physical Exam: Weak both legs with numbness in both feet.  Lumbar drain not currently working.  Lab Results:  Recent Labs  09/01/13 1615 09/02/13 0130  WBC 11.1* 10.2  HGB 9.2* 8.2*  HCT 27.3* 23.7*  PLT 220 198   BMET  Recent Labs  09/01/13 1615 09/02/13 0130 09/03/13 0330  NA 141  --  141  K 2.7*  --  4.0  CL 105  --  105  CO2 27  --  30  GLUCOSE 169*  --  99  BUN 22  --  12  CREATININE 0.68 0.74 0.70  CALCIUM 7.8*  --  7.8*    Studies/Results: No results found.  Assessment/Plan: Continue HOB -5 degrees until Monday per Dr. Cassandria Santee plan.     LOS: 10 days    Dorian Heckle, MD 09/04/2013, 10:08 AM

## 2013-09-04 NOTE — Progress Notes (Signed)
Pt c/o HA. Turned on side and noted drainage on bed in area of lumbar drain/ sx site. Md notified. Dr. Danielle Dess on unit took a look at site. Removed drain. Applied betadine to site, 4x4, and tegaderm drsg. Instructed to redress in same manner prn drainage. May use ABD if needed. Pt informed of plan. States she feels better already.

## 2013-09-05 LAB — CSF CULTURE W GRAM STAIN

## 2013-09-05 LAB — CSF CULTURE: Culture: NO GROWTH

## 2013-09-05 NOTE — Progress Notes (Signed)
Subjective: Patient reports feeling better this am  Objective: Vital signs in last 24 hours: Temp:  [97.8 F (36.6 C)-98.4 F (36.9 C)] 97.9 F (36.6 C) (12/07 0400) Pulse Rate:  [55-85] 78 (12/07 0800) Resp:  [10-20] 16 (12/07 0800) BP: (105-135)/(55-92) 122/67 mmHg (12/07 0800) SpO2:  [94 %-100 %] 98 % (12/07 0800) Weight:  [114.3 kg (251 lb 15.8 oz)] 114.3 kg (251 lb 15.8 oz) (12/07 0400)  Intake/Output from previous day: 12/06 0701 - 12/07 0700 In: 3280 [P.O.:880; I.V.:2400] Out: 3700 [Urine:3700] Intake/Output this shift: Total I/O In: 200 [I.V.:200] Out: -   Physical Exam: Continued weakness and numbness in both legs.  Tolerating -5 degrees bed position.  Lumbar drain D/Ced per Dr. Danielle Dess yesterday. Dressing is flat with no drainage.  Lab Results: No results found for this basename: WBC, HGB, HCT, PLT,  in the last 72 hours BMET  Recent Labs  09/03/13 0330  NA 141  K 4.0  CL 105  CO2 30  GLUCOSE 99  BUN 12  CREATININE 0.70  CALCIUM 7.8*    Studies/Results: No results found.  Assessment/Plan: Continue bedrest until AM. Plan per Dr. Jeral Fruit.    LOS: 11 days    Dorian Heckle, MD 09/05/2013, 9:50 AM

## 2013-09-05 NOTE — Progress Notes (Signed)
Patient ID: Sherry Wong, female   DOB: 1956-03-19, 57 y.o.   MRN: [id number] No headache, sensory with no changes. Plan oob in am

## 2013-09-06 NOTE — Progress Notes (Signed)
PT Cancellation Note  Patient Details Name: Sherry Wong MRN: [id number] DOB: [birth year]   Cancelled Treatment:    Reason Eval/Treat Not Completed: Patient not medically ready. Orders to slowly raise HOB this date. Will attempt OOB tomorrow if medically appropriate.   [name] 09/06/2013, 9:29 AM

## 2013-09-06 NOTE — Progress Notes (Signed)
Spoke with patient and her spouse regarding the patients recent fall.  Husband at bedside and attentive to patients needs.  Husband apologetic for his outburst and frustration.  Patient tearful, but thankful for the care she has received on .  Patient's spouse is also thankful for the care received.  Pt expresses that she just wants to get stronger so she can go to rehab.  No other needs or concerns at this time.  Staff post fall huddle was done with collaboration with Morrie Sheldon from Physical Therapy.

## 2013-09-06 NOTE — Progress Notes (Signed)
Occupational Therapy Treatment Patient Details Name: Sherry Wong MRN: [id number] DOB: 01/22/1956 Today's Date: 09/06/2013 Time: 4098-1191 OT Time Calculation (min): 27 min  OT Assessment / Plan / Recommendation  History of present illness s/p L4-5 laminectomy and facetecomty with pedical screws at L4, L5, S1. Pt then suffered from CSF leak post surgery and had been in bed 7 days. Pt had a seizure on 12/3 and required surgical intervention for CSF leak and again placed on bedrest until 12/8.   OT comments  Pt anxious about falling, but remains highly motivated to regain independence.  Focus of OT on back precaution ed, bed mobility, sitting balance and ADL at EOB.  Needs education on use of AE and work on transfers.  Follow Up Recommendations  CIR    Barriers to Discharge       Equipment Recommendations       Recommendations for Other Services    Frequency Min 2X/week   Progress towards OT Goals Progress towards OT goals: Progressing toward goals  Plan Discharge plan remains appropriate    Precautions / Restrictions Precautions Precautions: Back;Fall Precaution Comments: Pt able to state 2/3 back precautions after education.   Pertinent Vitals/Pain 5/10 pain, back, medication provided by RN, repositioned    ADL  Grooming: Wash/dry hands;Wash/dry face;Teeth care;Supervision/safety Where Assessed - Grooming: Unsupported sitting Upper Body Bathing: Minimal assistance Where Assessed - Upper Body Bathing: Unsupported sitting Upper Body Dressing: Set up Where Assessed - Upper Body Dressing: Unsupported sitting Equipment Used: Gait belt Transfers/Ambulation Related to ADLs: did not transfer pt today, worked on sit<>stand as precursor ADL Comments: Attempted to cross legs to access socks, will need AE.    OT Diagnosis:    OT Problem List:   OT Treatment Interventions:     OT Goals(current goals can now be found in the care plan section) Acute Rehab OT Goals Patient Stated  Goal: to get to where I can take care of myself  Visit Information  Last OT Received On: 09/06/13 Assistance Needed: +2 PT/OT/SLP Co-Evaluation/Treatment: Yes OT goals addressed during session: ADL's and self-care History of Present Illness: s/p L4-5 laminectomy and facetecomty with pedical screws at L4, L5, S1. Pt then suffered from CSF leak post surgery and had been in bed 7 days. Pt had a seizure on 12/3 and required surgical intervention for CSF leak and again placed on bedrest until 12/8.    Subjective Data      Prior Functioning       Cognition  Cognition Arousal/Alertness: Awake/alert Behavior During Therapy: WFL for tasks assessed/performed Overall Cognitive Status: Within Functional Limits for tasks assessed    Mobility  Bed Mobility Bed Mobility: Rolling Left;Left Sidelying to Sit;Sit to Sidelying Left;Scooting to [facility] Rolling Left: 3: Mod assist;With rail (heavy use of rail) Left Sidelying to Sit: 4: Min guard;With rails;HOB elevated Sit to Sidelying Left: 3: Mod assist;HOB flat (assist for LEs) Scooting to HOB: 1: +2 Total assist Scooting to [facility]: Patient Percentage: 0% Transfers Transfers: Sit to Stand;Stand to Sit Sit to Stand: 1: +2 Total assist Sit to Stand: Patient Percentage: 50% Stand to Sit: 1: +2 Total assist Stand to Sit: Patient Percentage: 50% Details for Transfer Assistance: sti<>stand x 2 with use of gait belt, pad, and knee blocked    Exercises      Balance Balance Balance Assessed: Yes Dynamic Sitting Balance Dynamic Sitting - Balance Support: Feet supported;No upper extremity supported Dynamic Sitting - Level of Assistance: 5: Stand by assistance Dynamic Sitting - Balance  Activities: Forward lean/weight shifting (UE bathing)   End of Session OT - End of Session Equipment Utilized During Treatment: Gait belt Activity Tolerance: Patient tolerated treatment well Patient left: in bed;with call bell/phone within reach Nurse Communication:  Mobility status  GO     Evern Bio 09/06/2013, 3:10 PM [phone]

## 2013-09-06 NOTE — Op Note (Signed)
[name], Sherry Wong                ACCOUNT NO.:  [id number]  MEDICAL RECORD NO.:  [id number]  LOCATION:                               FACILITY:  [facility]  PHYSICIAN:  Hilda Lias, M.D.   DATE OF BIRTH:  [birth year]  DATE OF PROCEDURE:  09/01/2013 DATE OF DISCHARGE:  09/01/2013                              OPERATIVE REPORT   PREOPERATIVE DIAGNOSES:  Cerebrospinal fluid leak.  Lumbar.  Status post L4-L5 fusion with cages and pedicle screws.  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  POSTOPERATIVE DIAGNOSES:  Cerebrospinal fluid leak.  Lumbar.  Status post L4-L5 fusion with cages and pedicle screws.  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  PROCEDURES: 1. Insertion of lumbar drain at the level of L2-L3 for CSF drain. 2. Exploration of the lumbar wound with repair of the opening of the     dura mater in the right posterolateral aspect at the level of L4     with a Prolene.  Exploration of the cages and the foramen.     Microscope.  SURGEON:  Hilda Lias, M.D.  ASSISTANT:  Stefani Dama, M.D.  CLINICAL HISTORY:  [name] is a lady who underwent fusion at the level of L4-5 a week ago.  Immediately after surgery, we found that she had quite a severe weakness distally.  She was taken back to surgery and removal of epidural hematoma was done.  At that time, we did a Valsalva maneuver, and there was no evidence of any CSF leak.  The patient was transferred to the ICU eventually and gradually, she had been getting a little bit better.  She was flat in bed for 4 days, and after that, we got her out of bed.  She had been out of bed since Sunday, which is 4 days ago, but today she developed excruciating headache.  She had one episode of seizure for 20 seconds and immediately, she had quite a bit of CSF in her lumbar wound.  CT scan of the head was negative.  I reviewed the case with Dr. Danielle Dess and we came with 3 approaches.  At the end, we decided to go  ahead and introduce a lumbar drain at the level of the L2-L3 and explore the lumbar wound.  I talked to the husband by telephone and the patient herself knew about the surgery.  DESCRIPTION OF PROCEDURE:  The patient was taken to the OR, and after intubation, she was positioned on prone manner.  The back was cleaned with DuraPrep.  Then using the C-arm, we localized the area of L2-L3, and needle from the lumbar drain kit was inserted.  With one single passage, I was able to get immediately CSF back.  The specimen was sent to the lab for Gram-stain as well as permanent culture.  Then, lumbar drain was inserted, which we were able to advance up to the mid thoracic area.  The drain was secured in place with stitches.  From then on, we removed the previous suture material in the lumbar wound and immediately CSF came.  We went straight down to the dura mater.  We explored the thecal sac and indeed via  arachnoid pouch, we saw in the first surgery was opened with some CSF coming through opening posterolaterally.  Six single stitches of 6-0 Prolene were used, and at the end, we had a good water of the dura mater.  Nevertheless, prior to closing the dura mater, we introduced some saline solution into the thecal sac and the fluid came back normal.  Exploration of the thecal sac showed that there was no evidence of any mass or hematoma in the area.  Then, we explored the foramen.  There were quite opened.  We explored the area of the cages in the left side, and they were in good position.  There was not in compromise of the thecal sac.  Valsalva maneuver was done in several occasions and there was no evidence of any CSF leak.  Good hemostasis was obtained and then the wound was closed with several layers of Vicryl and nylon for the skin.  The patient is going to go back to the ICU.          ______________________________ Hilda Lias, M.D.     EB/MEDQ  D:  09/02/2013  T:  09/02/2013  Job:   [id number]

## 2013-09-06 NOTE — Progress Notes (Signed)
Physical Therapy Treatment Patient Details Name: Sherry Wong MRN: [id number] DOB: 09-01-1956 Today's Date: 09/06/2013 Time:  -     PT Assessment / Plan / Recommendation  History of Present Illness s/p L4-5 laminectomy and facetecomty with pedical screws at L4, L5, S1. Pt then suffered from CSF leak post surgery and had been in bed 7 days. Pt had a seizure on 12/3 and required surgical intervention for CSF leak and again placed on bedrest until 12/8.   PT Comments   Pt tolerated EOB mobility well for having been on bedrest since Friday. Pt remains to have impaired sensation in bilat LEs but demo's increased strength in bilat LEs. Pt con't to be a good candidate for CIR upon d/c to maximize functional recovery for transition home.   Follow Up Recommendations  CIR     Does the patient have the potential to tolerate intense rehabilitation     Barriers to Discharge        Equipment Recommendations       Recommendations for Other Services Rehab consult  Frequency Min 5X/week   Progress towards PT Goals Progress towards PT goals: Progressing toward goals  Plan Current plan remains appropriate    Precautions / Restrictions Precautions Precautions: Back;Fall Precaution Comments: Pt able to state 2/3 back precautions after education. Restrictions Weight Bearing Restrictions: No   Pertinent Vitals/Pain Onset of back pain with mobility    Mobility  Bed Mobility Bed Mobility: Rolling Left;Left Sidelying to Sit;Sit to Sidelying Left;Scooting to [facility] Rolling Left: 3: Mod assist;With rail Left Sidelying to Sit: 4: Min guard;With rails;HOB elevated Sit to Sidelying Left: 3: Mod assist;HOB flat Scooting to HOB: 1: +2 Total assist Scooting to [facility]: Patient Percentage: 0% Transfers Transfers: Sit to Stand;Stand to Sit;Stand Pivot Transfers Sit to Stand: 1: +2 Total assist Sit to Stand: Patient Percentage: 50% Stand to Sit: 1: +2 Total assist Stand to Sit: Patient Percentage:  50% Details for Transfer Assistance: completed 2 sit to stand transfers. Pt with R knee buckling requiring blocking to maintain upright position. Pt stood x 30 sec each session Ambulation/Gait Ambulation/Gait Assistance: Not tested (comment)    Exercises General Exercises - Lower Extremity Ankle Circles/Pumps: AAROM;Both;10 reps;Supine Quad Sets: AROM;Both;10 reps;Supine Gluteal Sets: AROM;10 reps;Supine Long Arc Quad: AROM;Both;10 reps;Seated (with 5 sec hold) Hip ABduction/ADduction: AAROM;Both;10 reps;Supine   PT Diagnosis:    PT Problem List:   PT Treatment Interventions:     PT Goals (current goals can now be found in the care plan section) Acute Rehab PT Goals Patient Stated Goal: "i dont want to fall again"  Visit Information  Last PT Received On: 09/06/13 Assistance Needed: +2 PT/OT/SLP Co-Evaluation/Treatment: Yes PT goals addressed during session: Mobility/safety with mobility OT goals addressed during session: ADL's and self-care History of Present Illness: s/p L4-5 laminectomy and facetecomty with pedical screws at L4, L5, S1. Pt then suffered from CSF leak post surgery and had been in bed 7 days. Pt had a seizure on 12/3 and required surgical intervention for CSF leak and again placed on bedrest until 12/8.    Subjective Data  Patient Stated Goal: "i dont want to fall again"   Cognition  Cognition Arousal/Alertness: Awake/alert Behavior During Therapy: WFL for tasks assessed/performed Overall Cognitive Status: Within Functional Limits for tasks assessed    Balance  Balance Balance Assessed: Yes Dynamic Sitting Balance Dynamic Sitting - Balance Support: Feet supported;No upper extremity supported Dynamic Sitting - Level of Assistance: 5: Stand by assistance Dynamic Sitting - Balance  Activities: Forward lean/weight shifting (UE bathing)  End of Session PT - End of Session Equipment Utilized During Treatment: Gait belt Activity Tolerance: Patient tolerated  treatment well Patient left: in bed;with call bell/phone within reach Nurse Communication: Mobility status;Need for lift equipment   GP     [name] 09/06/2013, 3:29 PM  Lewis Shock, PT, DPT Pager #: [phone] Office #: [phone]

## 2013-09-06 NOTE — Progress Notes (Signed)
OT Cancellation Note  Patient Details Name: Sherry Wong MRN: [id number] DOB: Aug 18, 1956   Cancelled Treatment:    Reason Eval/Treat Not Completed: Patient not medically ready (Pt with orders to begin "slow HOB up" this a.m.) Will continue to follow and evaluate when orders for OOB mobility orders written.  Evern Bio 09/06/2013, 8:37 AM

## 2013-09-06 NOTE — Progress Notes (Signed)
NUTRITION FOLLOW UP  Intervention:    1.  Continue Resource Breeze po TID, each supplement provides 250 kcal and 9 grams of protein.   NUTRITION DIAGNOSIS:  Inadequate oral intake related to decreased appetite as evidenced by po < 25%; ongoing.   Goal:  Pt to meet >/= 90% of their estimated nutrition needs, not met.   Monitor:  PO intake, supplement acceptance, weight trend, labs  Assessment:   Pt admitted 11/26 and is s/p L4-5 laminectomy and facetectomy and left L4-L5, L5-S1 diskectomy 11/27. Pt then had exploration of the lumbar wound with evacuation of epidural hematoma 11/27. Pt with lumbar catheter in place and must be kept 5 degrees down.  HOB starting to elevate today, 12/8. Pt's diet advanced to Regular this am. Pt ate about 25% this morning. Pt is drinking the [place] and likes the orange.  Pt is hopeful to get to rehab soon so she can be home for Christmas. Family at bedside.   Height: Ht Readings from Last 1 Encounters:  08/26/13 5\' 8"  (1.727 m)    Weight Status:   Wt Readings from Last 1 Encounters:  09/06/13 247 lb 9.2 oz (112.3 kg)  Admission weight 250 lb (113.8 kg)  Re-estimated needs:  Kcal: 1900-2100  Protein: 100-115 grams  Fluid: > 1.9 L/day  Skin: back incisions  Diet Order: General Meal Completion: </= 25%   Intake/Output Summary (Last 24 hours) at 09/06/13 1025 Last data filed at 09/06/13 0900  Gross per 24 hour  Intake   2810 ml  Output   5895 ml  Net  -3085 ml    Last BM: 12/3   Labs:   Recent Labs Lab 09/01/13 1615 09/02/13 0130 09/03/13 0330  NA 141  --  141  K 2.7*  --  4.0  CL 105  --  105  CO2 27  --  30  BUN 22  --  12  CREATININE 0.68 0.74 0.70  CALCIUM 7.8*  --  7.8*  GLUCOSE 169*  --  99    CBG (last 3)  No results found for this basename: GLUCAP,  in the last 72 hours  Scheduled Meds: . albuterol  2 puff Inhalation BID  . antiseptic oral rinse  15 mL Mouth Rinse BID  . bupivacaine liposome  20 mL  Infiltration Once  . citalopram  20 mg Oral Daily  . dexamethasone  4 mg Intravenous Q6H   Or  . dexamethasone  4 mg Oral Q6H  . docusate sodium  200 mg Oral BID  . feeding supplement (RESOURCE BREEZE)  1 Container Oral TID BM  . fentaNYL  50-100 mcg Intravenous Once  . heparin subcutaneous  5,000 Units Subcutaneous Q8H  . hydrochlorothiazide  12.5 mg Oral Daily  . hyoscyamine  0.125 mg Oral TID  . levothyroxine  150 mcg Oral QAC breakfast  . losartan  50 mg Oral Daily  . pantoprazole  20 mg Oral Daily  . tamsulosin  0.8 mg Oral Daily    Continuous Infusions: . sodium chloride 100 mL/hr at 09/06/13 0900    Kendell Bane RD, LDN, CNSC [phone] Pager [phone] After Hours Pager

## 2013-09-06 NOTE — Progress Notes (Signed)
Patient attempted to get OOB to bedside commode.  Gait belt along with 3 nurses attempted to assist patient.  Patient started to pivot to commode next to bed and knees began to buckle.  Unsuccessful attempts made to place patient back in bed.  Patient lowered to knees.  Help was called and 6 nurses were successful at putting the patient back into the bed.  Patient did not complain of any pain, only complained of exhaustion.  Dressing was clean, dry and intact, along with incision.  Dr. Jeral Fruit notified.  Husband called and then at bedside.

## 2013-09-06 NOTE — Progress Notes (Signed)
Patient ID: Sherry Wong, female   DOB: 12-13-1955, 57 y.o.   MRN: [id number] Hob up. No headache. oob this pm

## 2013-09-07 NOTE — Discharge Summary (Signed)
Physician Discharge Summary  Patient ID: Sherry Wong MRN: [id number] DOB/AGE: 1956-07-31 57 y.o.  Admit date: 08/25/2013 Discharge date: 09/07/2013  Admission Diagnoses:l4-4, 51 ddd .   Discharge Diagnoses:  Active Problems:   Lumbar degenerative disc disease   Morbid obesity   HTN (hypertension)   Hypoxemia   Unspecified hypothyroidism paraparesis. Post op epidural hematoma lumbar Csf leak  Discharged Condition: paraparesis  Hospital Course: surgery  Consults: CCM. Rehabilitation medicine  Significant Diagnostic Studies: myelogram  Treatments: l4 to s1 fusion with removal of postop hematoma and repair of duratotomy  Discharge Exam:paraparesis. Gi/gu working    Disposition: to rehabilitation medicine     Medication List         albuterol 108 (90 BASE) MCG/ACT inhaler  Commonly known as:  PROVENTIL HFA;VENTOLIN HFA  Inhale 2 puffs into the lungs 2 (two) times daily.     citalopram 20 MG tablet  Commonly known as:  CELEXA  Take 20 mg by mouth daily.     diazepam 5 MG tablet  Commonly known as:  VALIUM  Take 5 mg by mouth every 8 (eight) hours as needed for muscle spasms.     HYDROcodone-acetaminophen 10-325 MG per tablet  Commonly known as:  NORCO  Take 1 tablet by mouth every 6 (six) hours as needed (for pain).     hyoscyamine 0.125 MG tablet  Commonly known as:  LEVSIN, ANASPAZ  Take 0.125 mg by mouth 3 (three) times daily.     levothyroxine 150 MCG tablet  Commonly known as:  SYNTHROID, LEVOTHROID  Take 150 mcg by mouth daily.     losartan-hydrochlorothiazide 50-12.5 MG per tablet  Commonly known as:  HYZAAR  Take 1 tablet by mouth daily.     pantoprazole 40 MG tablet  Commonly known as:  PROTONIX  Take 40 mg by mouth daily.         Signed: Karn Cassis 09/07/2013, 4:25 PM

## 2013-09-07 NOTE — Progress Notes (Signed)
Patient transferred from ICU to room 4N09 at this time. Alert and in stable condition.

## 2013-09-07 NOTE — Progress Notes (Signed)
Physical Therapy Treatment Patient Details Name: Sherry Wong MRN: [id number] DOB: 24-Apr-1956 Today's Date: 09/07/2013 Time: 4010-2725 PT Time Calculation (min): 30 min  PT Assessment / Plan / Recommendation  History of Present Illness s/p L4-5 laminectomy and facetecomty with pedical screws at L4, L5, S1. Pt then suffered from CSF leak post surgery and had been in bed 7 days. Pt had a seizure on 12/3 and required surgical intervention for CSF leak and again placed on bedrest until 12/8.   PT Comments   Pt con't to have minimal sensation limiting standing ability and endurance at this time. Pt however con't to gain bilat LE strength. Cont to recommend CIR for maximal functional recovery.   Follow Up Recommendations  CIR     Does the patient have the potential to tolerate intense rehabilitation     Barriers to Discharge        Equipment Recommendations       Recommendations for Other Services Rehab consult  Frequency Min 5X/week   Progress towards PT Goals Progress towards PT goals: Not progressing toward goals - comment  Plan Current plan remains appropriate    Precautions / Restrictions Precautions Precautions: Back;Fall Precaution Comments: Pt able to state 2/3 back precautions after education. Restrictions Weight Bearing Restrictions: No   Pertinent Vitals/Pain 6/10 back pain    Mobility  Bed Mobility Bed Mobility: Rolling Left;Left Sidelying to Sit;Sit to Sidelying Left;Scooting to [facility] Rolling Left: 4: Min assist;With rail Left Sidelying to Sit: 4: Min guard;HOB flat;With rails Details for Bed Mobility Assistance: pt with good technique Transfers Transfers: Sit to Stand;Stand to Sit Sit to Stand: 1: +2 Total assist Sit to Stand: Patient Percentage: 50% Stand to Sit: 1: +2 Total assist Stand to Sit: Patient Percentage: 50% Details for Transfer Assistance: 3 sets of stands at 10 seconds each. pt remains to have minimal bilat LE  sensation Ambulation/Gait Ambulation/Gait Assistance: Not tested (comment)    Exercises General Exercises - Lower Extremity Ankle Circles/Pumps: AROM;Both;10 reps;Seated Long Arc Quad: AROM;Both;10 reps;Seated Hip ABduction/ADduction: AROM;Both;10 reps;Seated Hip Flexion/Marching: AROM;10 reps;Seated   PT Diagnosis:    PT Problem List:   PT Treatment Interventions:     PT Goals (current goals can now be found in the care plan section)    Visit Information  Last PT Received On: 09/07/13 Assistance Needed: +2 History of Present Illness: s/p L4-5 laminectomy and facetecomty with pedical screws at L4, L5, S1. Pt then suffered from CSF leak post surgery and had been in bed 7 days. Pt had a seizure on 12/3 and required surgical intervention for CSF leak and again placed on bedrest until 12/8.    Subjective Data      Cognition  Cognition Arousal/Alertness: Awake/alert Behavior During Therapy: WFL for tasks assessed/performed Overall Cognitive Status: Within Functional Limits for tasks assessed    Balance     End of Session PT - End of Session Equipment Utilized During Treatment: Gait belt Activity Tolerance: Patient tolerated treatment well Patient left:  (sitting EOB for lunch) Nurse Communication: Mobility status   GP     [name] 09/07/2013, 2:53 PM  Lewis Shock, PT, DPT Pager #: [phone] Office #: [phone]

## 2013-09-07 NOTE — Progress Notes (Signed)
Patient ID: Sherry Wong, female   DOB: [birth year], 57 y.o.   MRN: [id number] No headache, neuro unchanged. To rehabilitation today

## 2013-09-07 NOTE — Progress Notes (Signed)
Patient ID: Sherry Wong, female   DOB: July 10, 1956, 57 y.o.   MRN: [id number] i was just notified that [name] is going to 4 tower and tomorrow to the rehabilitation unit

## 2013-09-08 ENCOUNTER — Inpatient Hospital Stay (HOSPITAL_COMMUNITY)
Admission: RE | Admit: 2013-09-08 | Discharge: 2013-09-29 | DRG: 945 | Disposition: A | Discharge status: Home Health | Payer: BC Managed Care – PPO | Source: Intra-hospital | Attending: Physical Medicine & Rehabilitation | Admitting: Physical Medicine & Rehabilitation

## 2013-09-08 DIAGNOSIS — G8929 Other chronic pain: Secondary | ICD-10-CM

## 2013-09-08 DIAGNOSIS — N319 Neuromuscular dysfunction of bladder, unspecified: Secondary | ICD-10-CM

## 2013-09-08 DIAGNOSIS — K219 Gastro-esophageal reflux disease without esophagitis: Secondary | ICD-10-CM

## 2013-09-08 DIAGNOSIS — J209 Acute bronchitis, unspecified: Secondary | ICD-10-CM | POA: Diagnosis not present

## 2013-09-08 DIAGNOSIS — G834 Cauda equina syndrome: Secondary | ICD-10-CM | POA: Diagnosis present

## 2013-09-08 DIAGNOSIS — E876 Hypokalemia: Secondary | ICD-10-CM

## 2013-09-08 DIAGNOSIS — E039 Hypothyroidism, unspecified: Secondary | ICD-10-CM

## 2013-09-08 DIAGNOSIS — D62 Acute posthemorrhagic anemia: Secondary | ICD-10-CM | POA: Diagnosis present

## 2013-09-08 DIAGNOSIS — I10 Essential (primary) hypertension: Secondary | ICD-10-CM | POA: Diagnosis present

## 2013-09-08 DIAGNOSIS — M545 Low back pain, unspecified: Secondary | ICD-10-CM

## 2013-09-08 DIAGNOSIS — M5136 Other intervertebral disc degeneration, lumbar region: Secondary | ICD-10-CM

## 2013-09-08 DIAGNOSIS — Z9089 Acquired absence of other organs: Secondary | ICD-10-CM

## 2013-09-08 DIAGNOSIS — M109 Gout, unspecified: Secondary | ICD-10-CM

## 2013-09-08 DIAGNOSIS — F329 Major depressive disorder, single episode, unspecified: Secondary | ICD-10-CM | POA: Diagnosis present

## 2013-09-08 DIAGNOSIS — R0902 Hypoxemia: Secondary | ICD-10-CM

## 2013-09-08 DIAGNOSIS — Z981 Arthrodesis status: Secondary | ICD-10-CM

## 2013-09-08 DIAGNOSIS — K589 Irritable bowel syndrome without diarrhea: Secondary | ICD-10-CM

## 2013-09-08 DIAGNOSIS — Z5189 Encounter for other specified aftercare: Principal | ICD-10-CM

## 2013-09-08 DIAGNOSIS — F341 Dysthymic disorder: Secondary | ICD-10-CM

## 2013-09-08 DIAGNOSIS — Z860100 Personal history of colon polyps, unspecified: Secondary | ICD-10-CM

## 2013-09-08 DIAGNOSIS — K592 Neurogenic bowel, not elsewhere classified: Secondary | ICD-10-CM

## 2013-09-08 LAB — URINE MICROSCOPIC-ADD ON

## 2013-09-08 LAB — URINALYSIS, ROUTINE W REFLEX MICROSCOPIC
Bilirubin Urine: NEGATIVE
Glucose, UA: NEGATIVE mg/dL
Hgb urine dipstick: NEGATIVE
Ketones, ur: NEGATIVE mg/dL
Nitrite: POSITIVE — AB
Protein, ur: NEGATIVE mg/dL
Specific Gravity, Urine: 1.011 (ref 1.005–1.030)
Urobilinogen, UA: 0.2 mg/dL (ref 0.0–1.0)
pH: 8 (ref 5.0–8.0)

## 2013-09-08 MED ORDER — BISACODYL 10 MG RE SUPP
10.0000 mg | Freq: Every day | RECTAL | Status: DC
  Administered 2013-09-09 – 2013-09-16 (×8): 10 mg via RECTAL
  Filled 2013-09-08 (×9): qty 1

## 2013-09-08 MED ORDER — BOOST / RESOURCE BREEZE PO LIQD
1.0000 | Freq: Three times a day (TID) | ORAL | Status: DC
  Administered 2013-09-08 – 2013-09-29 (×54): 1 via ORAL

## 2013-09-08 MED ORDER — DIPHENHYDRAMINE HCL 12.5 MG/5ML PO ELIX: 12.5000 mg | ORAL_SOLUTION | Freq: Four times a day (QID) | ORAL | Status: DC | PRN

## 2013-09-08 MED ORDER — PANTOPRAZOLE SODIUM 20 MG PO TBEC
20.0000 mg | DELAYED_RELEASE_TABLET | Freq: Every day | ORAL | Status: DC
  Administered 2013-09-09 – 2013-09-29 (×21): 20 mg via ORAL
  Filled 2013-09-08 (×23): qty 1

## 2013-09-08 MED ORDER — OXYCODONE-ACETAMINOPHEN 5-325 MG PO TABS
1.0000 | ORAL_TABLET | ORAL | Status: DC | PRN
  Administered 2013-09-08 – 2013-09-27 (×72): 2 via ORAL
  Administered 2013-09-27: 1 via ORAL
  Administered 2013-09-27 – 2013-09-29 (×7): 2 via ORAL
  Filled 2013-09-08 (×83): qty 2

## 2013-09-08 MED ORDER — BISACODYL 10 MG RE SUPP
10.0000 mg | Freq: Every day | RECTAL | Status: DC | PRN
  Filled 2013-09-08 (×2): qty 1

## 2013-09-08 MED ORDER — TRAMADOL HCL 50 MG PO TABS
50.0000 mg | ORAL_TABLET | Freq: Four times a day (QID) | ORAL | Status: DC | PRN
  Administered 2013-09-13 – 2013-09-23 (×11): 50 mg via ORAL
  Filled 2013-09-08 (×12): qty 1

## 2013-09-08 MED ORDER — POLYETHYLENE GLYCOL 3350 17 G PO PACK
17.0000 g | PACK | Freq: Every day | ORAL | Status: DC | PRN
  Filled 2013-09-08: qty 1

## 2013-09-08 MED ORDER — PROCHLORPERAZINE 25 MG RE SUPP
12.5000 mg | Freq: Four times a day (QID) | RECTAL | Status: DC | PRN
  Filled 2013-09-08: qty 1

## 2013-09-08 MED ORDER — BISACODYL 10 MG RE SUPP
10.0000 mg | Freq: Once | RECTAL | Status: AC
  Administered 2013-09-08: 10 mg via RECTAL
  Filled 2013-09-08: qty 1

## 2013-09-08 MED ORDER — DEXAMETHASONE 4 MG PO TABS
4.0000 mg | ORAL_TABLET | Freq: Three times a day (TID) | ORAL | Status: DC
  Administered 2013-09-08 – 2013-09-19 (×46): 4 mg via ORAL
  Filled 2013-09-08 (×51): qty 1

## 2013-09-08 MED ORDER — TAMSULOSIN HCL 0.4 MG PO CAPS
0.8000 mg | ORAL_CAPSULE | Freq: Every day | ORAL | Status: DC
  Administered 2013-09-09 – 2013-09-11 (×3): 0.8 mg via ORAL
  Filled 2013-09-08 (×4): qty 2

## 2013-09-08 MED ORDER — MENTHOL 3 MG MT LOZG
1.0000 | LOZENGE | OROMUCOSAL | Status: DC | PRN
  Filled 2013-09-08: qty 9

## 2013-09-08 MED ORDER — ACETAMINOPHEN 325 MG PO TABS: 325.0000 mg | ORAL_TABLET | ORAL | Status: DC | PRN

## 2013-09-08 MED ORDER — HYDROCHLOROTHIAZIDE 12.5 MG PO CAPS
12.5000 mg | ORAL_CAPSULE | Freq: Every day | ORAL | Status: DC
  Administered 2013-09-09 – 2013-09-13 (×5): 12.5 mg via ORAL
  Filled 2013-09-08 (×8): qty 1

## 2013-09-08 MED ORDER — DOCUSATE SODIUM 100 MG PO CAPS
200.0000 mg | ORAL_CAPSULE | Freq: Two times a day (BID) | ORAL | Status: DC
  Administered 2013-09-08 – 2013-09-12 (×8): 200 mg via ORAL
  Filled 2013-09-08 (×10): qty 2

## 2013-09-08 MED ORDER — ALBUTEROL SULFATE (5 MG/ML) 0.5% IN NEBU
2.5000 mg | INHALATION_SOLUTION | RESPIRATORY_TRACT | Status: DC | PRN
  Administered 2013-09-09 – 2013-09-26 (×8): 2.5 mg via RESPIRATORY_TRACT
  Filled 2013-09-08 (×12): qty 0.5

## 2013-09-08 MED ORDER — ALUM & MAG HYDROXIDE-SIMETH 200-200-20 MG/5ML PO SUSP: 30.0000 mL | ORAL | Status: DC | PRN

## 2013-09-08 MED ORDER — HYOSCYAMINE SULFATE 0.125 MG SL SUBL
0.1250 mg | SUBLINGUAL_TABLET | Freq: Three times a day (TID) | SUBLINGUAL | Status: DC
  Administered 2013-09-08 – 2013-09-12 (×11): 0.125 mg via SUBLINGUAL
  Filled 2013-09-08 (×14): qty 1

## 2013-09-08 MED ORDER — PROCHLORPERAZINE MALEATE 5 MG PO TABS
5.0000 mg | ORAL_TABLET | Freq: Four times a day (QID) | ORAL | Status: DC | PRN
  Filled 2013-09-08: qty 2

## 2013-09-08 MED ORDER — ENOXAPARIN SODIUM 40 MG/0.4ML ~~LOC~~ SOLN
40.0000 mg | SUBCUTANEOUS | Status: DC
  Administered 2013-09-09 – 2013-09-29 (×21): 40 mg via SUBCUTANEOUS
  Filled 2013-09-08 (×22): qty 0.4

## 2013-09-08 MED ORDER — PROCHLORPERAZINE EDISYLATE 5 MG/ML IJ SOLN
5.0000 mg | Freq: Four times a day (QID) | INTRAMUSCULAR | Status: DC | PRN
  Filled 2013-09-08: qty 2

## 2013-09-08 MED ORDER — ALBUTEROL SULFATE HFA 108 (90 BASE) MCG/ACT IN AERS
2.0000 | INHALATION_SPRAY | RESPIRATORY_TRACT | Status: DC | PRN
  Filled 2013-09-08: qty 6.7

## 2013-09-08 MED ORDER — CITALOPRAM HYDROBROMIDE 20 MG PO TABS
20.0000 mg | ORAL_TABLET | Freq: Every day | ORAL | Status: DC
  Administered 2013-09-09 – 2013-09-29 (×21): 20 mg via ORAL
  Filled 2013-09-08 (×22): qty 1

## 2013-09-08 MED ORDER — GUAIFENESIN-DM 100-10 MG/5ML PO SYRP: 5.0000 mL | ORAL_SOLUTION | Freq: Four times a day (QID) | ORAL | Status: DC | PRN

## 2013-09-08 MED ORDER — FLEET ENEMA 7-19 GM/118ML RE ENEM: 1.0000 | ENEMA | Freq: Every day | RECTAL | Status: DC | PRN

## 2013-09-08 MED ORDER — LOSARTAN POTASSIUM 25 MG PO TABS
25.0000 mg | ORAL_TABLET | Freq: Every day | ORAL | Status: DC
  Administered 2013-09-09 – 2013-09-29 (×21): 25 mg via ORAL
  Filled 2013-09-08 (×22): qty 1

## 2013-09-08 MED ORDER — TRAZODONE HCL 50 MG PO TABS: 25.0000 mg | ORAL_TABLET | Freq: Every evening | ORAL | Status: DC | PRN

## 2013-09-08 MED ORDER — PNEUMOCOCCAL VAC POLYVALENT 25 MCG/0.5ML IJ INJ
0.5000 mL | INJECTION | INTRAMUSCULAR | Status: AC
  Administered 2013-09-09: 0.5 mL via INTRAMUSCULAR
  Filled 2013-09-08: qty 0.5

## 2013-09-08 MED ORDER — PHENOL 1.4 % MT LIQD
1.0000 | OROMUCOSAL | Status: DC | PRN
  Filled 2013-09-08: qty 177

## 2013-09-08 MED ORDER — LEVOTHYROXINE SODIUM 150 MCG PO TABS
150.0000 ug | ORAL_TABLET | Freq: Every day | ORAL | Status: DC
  Administered 2013-09-09 – 2013-09-29 (×21): 150 ug via ORAL
  Filled 2013-09-08 (×23): qty 1

## 2013-09-08 MED ORDER — HYOSCYAMINE SULFATE 0.125 MG SL SUBL
0.1250 mg | SUBLINGUAL_TABLET | Freq: Three times a day (TID) | SUBLINGUAL | Status: DC
  Administered 2013-09-08: 0.125 mg via SUBLINGUAL
  Filled 2013-09-08 (×3): qty 1

## 2013-09-08 MED ORDER — METHOCARBAMOL 500 MG PO TABS
500.0000 mg | ORAL_TABLET | Freq: Four times a day (QID) | ORAL | Status: DC | PRN
  Administered 2013-09-10 – 2013-09-29 (×17): 500 mg via ORAL
  Filled 2013-09-08 (×17): qty 1

## 2013-09-08 NOTE — Progress Notes (Signed)
Orthopedic Tech Progress Note Patient Details:  Sherry Wong 12/10/1955 [id number]  Patient ID: Sherry Wong, female   DOB: 1956/08/08, 57 y.o.   MRN: [id number]   [name] 09/08/2013, 4:35 PMCalled advanced for prafo boot.

## 2013-09-08 NOTE — Progress Notes (Signed)
Seen and agreed 09/08/2013 Sixto Bowdish Elizabeth PTA [phone] pager [phone] office    

## 2013-09-08 NOTE — Progress Notes (Signed)
Physical Therapy Treatment Patient Details Name: Sherry Wong MRN: [id number] DOB: [birth year] Today's Date: 09/08/2013 Time: 4098-1191 PT Time Calculation (min): 28 min  PT Assessment / Plan / Recommendation  History of Present Illness s/p L4-5 laminectomy and facetecomty with pedical screws at L4, L5, S1. Pt then suffered from CSF leak post surgery and had been in bed 7 days. Pt had a seizure on 12/3 and required surgical intervention for CSF leak and again placed on bedrest until 12/8.   PT Comments   Pt slowly progressing with standing tolerance and bed mobility today. Pt planning to d/c today to CIR to maximize functional I prior to d/c home.   Follow Up Recommendations  CIR     Does the patient have the potential to tolerate intense rehabilitation     Barriers to Discharge        Equipment Recommendations       Recommendations for Other Services Rehab consult  Frequency Min 5X/week   Progress towards PT Goals Progress towards PT goals: Progressing toward goals  Plan Current plan remains appropriate    Precautions / Restrictions Precautions Precautions: Back;Fall Precaution Comments: Pt able to state 3/3 back precautions after education. Restrictions Weight Bearing Restrictions: No   Pertinent Vitals/Pain 8/10; per pt premedicated    Mobility  Bed Mobility Bed Mobility: Rolling Right;Right Sidelying to Sit;Sitting - Scoot to [place] of Bed Rolling Right: 4: Min assist;With rail Right Sidelying to Sit: 4: Min assist;With rails Sitting - Scoot to [place] of Bed: 4: Min assist;With rail Details for Bed Mobility Assistance: min assist to bring RLE to EOB Transfers Transfers: Sit to Stand;Stand to Sit Sit to Stand: 1: +2 Total assist;From bed;With upper extremity assist Sit to Stand: Patient Percentage: 70% Stand to Sit: 1: +2 Total assist;To bed;With upper extremity assist Stand to Sit: Patient Percentage: 70% Transfer via Lift Equipment: Stedy Details for Transfer  Assistance: sit<>stands x3 30second each in steady with pt pulling up to steady; pt continues to have decreased LE sensation/ hypersensitivity in toes    Exercises General Exercises - Lower Extremity Ankle Circles/Pumps: AROM;Both;10 reps;Seated Quad Sets: AROM;Both;10 reps;Seated Gluteal Sets: AROM;10 reps;Seated   PT Diagnosis:    PT Problem List:   PT Treatment Interventions:     PT Goals (current goals can now be found in the care plan section)    Visit Information  Last PT Received On: 09/08/13 Assistance Needed: +2 History of Present Illness: s/p L4-5 laminectomy and facetecomty with pedical screws at L4, L5, S1. Pt then suffered from CSF leak post surgery and had been in bed 7 days. Pt had a seizure on 12/3 and required surgical intervention for CSF leak and again placed on bedrest until 12/8.    Subjective Data      Cognition  Cognition Arousal/Alertness: Awake/alert Behavior During Therapy: WFL for tasks assessed/performed Overall Cognitive Status: Within Functional Limits for tasks assessed    Balance     End of Session PT - End of Session Equipment Utilized During Treatment: Gait belt Antony Salmon) Activity Tolerance: Patient limited by fatigue;Patient limited by pain Patient left: in chair;with call bell/phone within reach Nurse Communication: Mobility status (to use steady back to bed)   GP     Senai Ramnath, SPTA 09/08/2013, 10:30 AM

## 2013-09-08 NOTE — Progress Notes (Signed)
Orthopedic Tech Progress Note Patient Details:  [name] March 03, 1956 [id number] Completed by Advanced  Patient ID: [name], female   DOB: [birth year], 57 y.o.   MRN: [id number]   [name] 09/08/2013, 7:38 PM

## 2013-09-08 NOTE — Progress Notes (Signed)
UR complete.  Adham Johnson RN, MSN 

## 2013-09-08 NOTE — Progress Notes (Signed)
Rehab admissions - I contacted [employer] and have re-authorization for acute inpatient rehab admission for today.  Bed available, patient agreeable, and will admit to acute inpatient rehab today.  Call me for questions.  #[phone]

## 2013-09-08 NOTE — Progress Notes (Signed)
Patient ID: Sherry Wong, female   DOB: 1956/08/10, 57 y.o.   MRN: [id number] Satble. To rehabilitation today. Spoke with her and husband

## 2013-09-09 ENCOUNTER — Inpatient Hospital Stay (HOSPITAL_COMMUNITY): Payer: BC Managed Care – PPO | Admitting: Physical Therapy

## 2013-09-09 ENCOUNTER — Inpatient Hospital Stay (HOSPITAL_COMMUNITY): Payer: BC Managed Care – PPO

## 2013-09-09 LAB — COMPREHENSIVE METABOLIC PANEL
ALT: 16 U/L (ref 0–35)
AST: 11 U/L (ref 0–37)
Albumin: 3.1 g/dL — ABNORMAL LOW (ref 3.5–5.2)
Alkaline Phosphatase: 59 U/L (ref 39–117)
BUN: 18 mg/dL (ref 6–23)
CO2: 30 mEq/L (ref 19–32)
Calcium: 9 mg/dL (ref 8.4–10.5)
Chloride: 102 mEq/L (ref 96–112)
Creatinine, Ser: 0.68 mg/dL (ref 0.50–1.10)
GFR calc Af Amer: >90 mL/min (ref >90)
GFR calc non Af Amer: >90 mL/min (ref >90)
Glucose, Bld: 99 mg/dL (ref 70–99)
Potassium: 3.8 mEq/L (ref 3.5–5.1)
Sodium: 140 mEq/L (ref 135–145)
Total Bilirubin: 0.6 mg/dL (ref 0.3–1.2)
Total Protein: 5.8 g/dL — ABNORMAL LOW (ref 6.0–8.3)

## 2013-09-09 LAB — CBC WITH DIFFERENTIAL/PLATELET
Basophils Absolute: 0 10*3/uL (ref 0.0–0.1)
Basophils Relative: 0 % (ref 0–1)
Eosinophils Absolute: 0 10*3/uL (ref 0.0–0.7)
Eosinophils Relative: 0 % (ref 0–5)
HCT: 29.9 % — ABNORMAL LOW (ref 36.0–46.0)
Hemoglobin: 9.5 g/dL — ABNORMAL LOW (ref 12.0–15.0)
Lymphocytes Relative: 13 % (ref 12–46)
Lymphs Abs: 1.3 10*3/uL (ref 0.7–4.0)
MCH: 29.7 pg (ref 26.0–34.0)
MCHC: 31.8 g/dL (ref 30.0–36.0)
MCV: 93.4 fL (ref 78.0–100.0)
Monocytes Absolute: 0.6 10*3/uL (ref 0.1–1.0)
Monocytes Relative: 6 % (ref 3–12)
Neutro Abs: 7.9 10*3/uL — ABNORMAL HIGH (ref 1.7–7.7)
Neutrophils Relative %: 80 % — ABNORMAL HIGH (ref 43–77)
Platelets: 280 10*3/uL (ref 150–400)
RBC: 3.2 MIL/uL — ABNORMAL LOW (ref 3.87–5.11)
RDW: 15.1 % (ref 11.5–15.5)
WBC: 9.9 10*3/uL (ref 4.0–10.5)

## 2013-09-09 MED ORDER — SORBITOL 70 % SOLN
30.0000 mL | Status: AC
  Filled 2013-09-09: qty 30

## 2013-09-09 MED ORDER — ALPRAZOLAM 0.25 MG PO TABS
0.2500 mg | ORAL_TABLET | Freq: Two times a day (BID) | ORAL | Status: DC | PRN
  Administered 2013-09-09 – 2013-09-28 (×26): 0.25 mg via ORAL
  Filled 2013-09-09 (×28): qty 1

## 2013-09-09 MED ORDER — FLEET ENEMA 7-19 GM/118ML RE ENEM: 1.0000 | ENEMA | Freq: Every day | RECTAL | Status: DC | PRN

## 2013-09-09 MED ORDER — POLYETHYLENE GLYCOL 3350 17 G PO PACK
17.0000 g | PACK | Freq: Every day | ORAL | Status: DC
  Administered 2013-09-09 – 2013-09-13 (×5): 17 g via ORAL
  Filled 2013-09-09 (×8): qty 1

## 2013-09-09 NOTE — Progress Notes (Signed)
Patient information reviewed and entered into eRehab system by Britaney Espaillat, RN, CRRN, PPS Coordinator.  Information including medical coding and functional independence measure will be reviewed and updated through discharge.    

## 2013-09-09 NOTE — IPOC Note (Signed)
Overall Plan of Care [facility]) Patient Details Name: Sherry Wong MRN: [id number] DOB: [birth year]  Admitting Diagnosis: CAUDA EQUINA SYNDROME  Hospital Problems: Active Problems:   Cauda equina syndrome     Functional Problem List: Nursing Bladder;Bowel;Edema;Endurance;Medication Management;Motor;Pain;Perception;Safety;Skin Integrity  PT Balance;Endurance;Motor;Pain;Sensory  OT    SLP    TR         Basic ADL's: OT Bathing;Dressing;Toileting     Advanced  ADL's: OT Simple Meal Preparation;Light Housekeeping     Transfers: PT Bed Mobility;Bed to Chair;Car;Furniture  OT Toilet;Tub/Shower     Locomotion: PT Ambulation;Wheelchair Mobility;Stairs     Additional Impairments: OT    SLP        TR      Anticipated Outcomes Item Anticipated Outcome  Self Feeding Independent  Swallowing      Basic self-care  Mod I  Toileting  Mod I   Bathroom Transfers Mod I - Supervision  Bowel/Bladder  bowel managed with total assist; bladder managed with mod I assist of med  Transfers  Supervision/modified independent  Locomotion  Min assist (mod assist stairs)  Communication     Cognition     Pain  Pain at or below 4  Safety/Judgment  Maintain safety with min A   Therapy Plan: PT Intensity: Minimum of 1-2 x/day ,45 to 90 minutes PT Frequency: 5 out of 7 days PT Duration Estimated Length of Stay: ~21 days OT Intensity: Minimum of 1-2 x/day, 45 to 90 minutes OT Frequency: 5 out of 7 days OT Duration/Estimated Length of Stay: 14-21 days         Team Interventions: Nursing Interventions Patient/Family Education;Bladder Management;Skin Care/Wound Management;Bowel Management;Disease Management/Prevention;Discharge Planning;Pain Management;Medication Management;Psychosocial Support  PT interventions Ambulation/gait training;Balance/vestibular training;Discharge planning;Community reintegration;DME/adaptive equipment instruction;Functional electrical stimulation;Functional  mobility training;Patient/family education;Splinting/orthotics;Therapeutic Exercise;UE/LE Coordination activities;Therapeutic Activities;Skin care/wound management;Pain management;Neuromuscular re-education;Psychosocial support;Stair training;UE/LE Strength taining/ROM;Wheelchair propulsion/positioning  OT Interventions Balance/vestibular training;Discharge planning;DME/adaptive equipment instruction;Patient/family education;Pain management;Self Care/advanced ADL retraining;Therapeutic Activities;Therapeutic Exercise;Wheelchair propulsion/positioning;UE/LE Strength taining/ROM  SLP Interventions    TR Interventions    SW/CM Interventions      Team Discharge Planning: Destination: PT-Home ,OT- Home , SLP-  Projected Follow-up: PT-Home health PT;24 hour supervision/assistance, OT-  Home health OT, SLP-  Projected Equipment Needs: PT-To be determined, OT- 3 in 1 bedside comode;Tub/shower bench, SLP-  Equipment Details: PT- , OT-  Patient/family involved in discharge planning: PT- Patient,  OT-Patient, SLP-   MD ELOS: 20 days Medical Rehab Prognosis:  Excellent Assessment: The patient has been admitted for CIR therapies. The team will be addressing, functional mobility, strength, stamina, balance, safety, adaptive techniques/equipment, self-care, bowel and bladder mgt, patient and caregiver education, NMR, pain mgt, back precautions, ?orthotics. Goals have been set at mod I for basic self-care taks, mod I to supervision for transfers, min assist for gait.    Ranelle Oyster, MD, FAAPMR      See Team Conference Notes for weekly updates to the plan of care

## 2013-09-09 NOTE — Evaluation (Signed)
Occupational Therapy Assessment and Plan  Patient Details  Name: Sherry Wong MRN: [id number] Date of Birth: April 09, 1956  OT Diagnosis: abnormal posture, acute pain and muscle weakness (generalized) Rehab Potential: Rehab Potential: Good ELOS: 14-21 days   Today's Date: 09/09/2013 Time: 5621-3086 Time Calculation (min): 75 min  Problem List:  Patient Active Problem List   Diagnosis Date Noted  . Cauda equina syndrome 09/08/2013  . Morbid obesity 08/26/2013  . HTN (hypertension) 08/26/2013  . Hypoxemia 08/26/2013  . Unspecified hypothyroidism 08/26/2013  . Lumbar degenerative disc disease 08/25/2013    Past Medical History:  Past Medical History  Diagnosis Date  . PONV (postoperative nausea and vomiting)   . Hypertension     takes Losartan daily  . Cough   . Sinus drainage   . Pneumonia     hx of 15+yrs ago  . Chronic back pain     ddd/lumbago,and radiculopathy  . Arthritis   . Gout     hx of  . GERD (gastroesophageal reflux disease)     takes Protonix daily  . H/O hiatal hernia   . Gastric ulcer   . Ulcerative colitis   . IBS (irritable bowel syndrome)   . Constipation   . Diarrhea   . Hx of colonic polyps   . Urinary incontinence   . Nocturia   . Hypothyroidism     takes Levothyroxine daily  . Depression   . Anxiety     takes celexa daily   Past Surgical History:  Past Surgical History  Procedure Laterality Date  . Cysts removed from ovary    . Cholecystectomy    . Dilation and curettage of uterus    . Breast surgery      left breast lumpectomy  . Breast biopsy      right  . Colonoscopy    . Esophagogastroduodenoscopy    . Lumbar laminectomy/decompression microdiscectomy  12/03/2011    Procedure: LUMBAR LAMINECTOMY/DECOMPRESSION MICRODISCECTOMY 2 LEVELS;  Surgeon: Karn Cassis, MD;  Location: [facility];  Service: Neurosurgery;  Laterality: Right;  Right Lumbar four-five Lumbar five sacral one Foraminotomies  . Transforaminal lumbar  interbody fusion (tlif) with pedicle screw fixation 2 level N/A 08/25/2013    Procedure:  Lumbar four-five, Lumbar five-Sacral one Transforaminal Lumbar Interbody Fusion ;  Surgeon: Karn Cassis, MD;  Location: [facility];  Service: Neurosurgery;  Laterality: N/A;  . Hematoma evacuation N/A 08/25/2013    Procedure: EVACUATION OF LUMBAR HEMATOMA;  Surgeon: Karn Cassis, MD;  Location: [facility];  Service: Neurosurgery;  Laterality: N/A;  . Placement of lumbar drain N/A 09/01/2013    Procedure: PLACEMENT OF LUMBAR DRAIN;  Surgeon: Karn Cassis, MD;  Location: [facility];  Service: Neurosurgery;  Laterality: N/A;  . Lumbar wound debridement N/A 09/01/2013    Procedure: LUMBAR WOUND DEBRIDEMENT;  Surgeon: Karn Cassis, MD;  Location: [facility];  Service: Neurosurgery;  Laterality: N/A;    Assessment & Plan Clinical Impression: Patient is a 57 y.o. year old female with history of HTN, chronic back pain with radiation to BLE due to L4-5 and L5-S1 DDD. Patient elected to undergo L4-5 lam and L4/5 and L5/S1 disectomy with decompression of thecal sac on 08/25/13. Post op with paraparesis due to CSF leak and patient taken back to OR later that evening for exploration of wound with evacuation of epidural hematoma. Post op ABLA noted with hgb-8.6. On bedrest till 08/30/13 and now able to to flex knees,  has flickering movement in toes and has had improvement in sensation BLE. Urine output improving and BP meds on hold due to hypotension. On 12/03, patient with seizure episode and was found to have CSF leak therefore patient taken to OR for wound debridement and lumbar drain placement. CT head without acute abnormality. She was placed on bedrest and lumbar drain d/c 12/07. HOB being slowly increased and  Patient transferred to CIR on 09/08/2013 .    Patient currently requires max with basic self-care skills secondary to muscle weakness.  Prior to hospitalization, patient could complete  BADL/iADL with independent .  Patient will benefit from skilled intervention to increase independence with basic self-care skills and increase level of independence with iADL prior to discharge home with care partner.  Anticipate patient will require intermittent supervision and follow up home health.  OT - End of Session Activity Tolerance: Tolerates 30+ min activity with multiple rests Endurance Deficit: Yes OT Assessment Rehab Potential: Good OT Basic ADL's Functional Problem(s): Bathing;Dressing;Toileting OT Advanced ADL's Functional Problem(s): Simple Meal Preparation;Light Housekeeping OT Transfers Functional Problem(s): Toilet;Tub/Shower OT Plan OT Intensity: Minimum of 1-2 x/day, 45 to 90 minutes OT Frequency: 5 out of 7 days OT Duration/Estimated Length of Stay: 14-21 days OT Treatment/Interventions: Balance/vestibular training;Discharge planning;DME/adaptive equipment instruction;Patient/family education;Pain management;Self Care/advanced ADL retraining;Therapeutic Activities;Therapeutic Exercise;Wheelchair propulsion/positioning;UE/LE Strength taining/ROM OT Self Feeding Anticipated Outcome(s): Independent OT Basic Self-Care Anticipated Outcome(s): Mod I OT Toileting Anticipated Outcome(s): Mod I OT Bathroom Transfers Anticipated Outcome(s): Mod I - Supervision OT Recommendation Patient destination: Home Follow Up Recommendations: Home health OT Equipment Recommended: 3 in 1 bedside comode;Tub/shower bench   Skilled Therapeutic Intervention 1:1 initial evaluation completed with intervention provided to emphasize improved activity tolerance, improved transfers, adapted toileting skills, re-ed on back precautions and introduction to use of AE to maintain back precautions during ADL.  Patient requested toilet transfer and assist with toileting during session with max assist to perform squat pivot transfer to/from toilet and extra time to perform assisted toilet hygiene using  toilet aid (tongs).  OT Evaluation Precautions/Restrictions  Precautions Precautions: Back;Fall Precaution Booklet Issued: Yes (comment) Precaution Comments: Pt able to recall 2 of 3  General Chart Reviewed: Yes Family/Caregiver Present: No  Vital Signs Therapy Vitals Temp: 97.6 F (36.4 C) Temp src: Oral Pulse Rate: 66 Resp: 18 BP: 110/69 mmHg Patient Position, if appropriate: Sitting Oxygen Therapy SpO2: 100 % O2 Device: None (Room air)  Pain Pain Assessment Pain Assessment: 0-10 Pain Score: 6  Pain Type: Acute pain Pain Location: Back Pain Orientation: Lower Pain Descriptors / Indicators: Burning;Aching Patients Stated Pain Goal: 0 Pain Intervention(s): Medication (See eMAR)  Home Living/Prior Functioning Home Living Available Help at Discharge: Family Type of Home: House Home Access: Stairs to enter Secretary/administrator of Steps: 5 Entrance Stairs-Rails: Left;Right Home Layout: One level  Lives With: Spouse IADL History Homemaking Responsibilities: Yes Meal Prep Responsibility: Primary Laundry Responsibility: Primary Cleaning Responsibility: Primary Bill Paying/Finance Responsibility: Primary Shopping Responsibility: Secondary Child Care Responsibility: No Homemaking Comments: shares with husband Current License: Yes Mode of Transportation: Car Education: HS Occupation: On disability Leisure and Hobbies: Development worker, international aid, walk, golf,  Prior Function Level of Independence: Independent with basic ADLs  Able to Take Stairs?: Yes Driving: Yes Vocation: On disability Vocation Requirements: on SSDI, since 04/2012  ADL ADL ADL Comments: see FIM  Vision/Perception  Vision - History Baseline Vision: Wears glasses only for reading Patient Visual Report: Blurring of vision Vision - Assessment Eye Alignment: Within Functional Limits Perception Perception: Within  Functional Limits Praxis Praxis: Intact   Cognition Overall Cognitive Status: Within  Functional Limits for tasks assessed Orientation Level: Oriented X4 Attention: Alternating Alternating Attention: Appears intact Memory: Appears intact Awareness: Appears intact Problem Solving: Appears intact Safety/Judgment: Appears intact  Sensation Sensation Light Touch: Impaired by gross assessment (@ BLE) Stereognosis: Appears Intact Hot/Cold: Appears Intact Proprioception: Appears Intact Additional Comments: BLE Coordination Gross Motor Movements are Fluid and Coordinated: Yes Fine Motor Movements are Fluid and Coordinated: Yes  Motor  Motor Motor: Within Functional Limits  Mobility  Bed Mobility Bed Mobility: Rolling Right Rolling Right: 4: Min assist;With rail Sitting - Scoot to Edge of Bed: 4: Min assist   Trunk/Postural Assessment  Cervical Assessment Cervical Assessment: Within Functional Limits Thoracic Assessment Thoracic Assessment: Within Functional Limits Lumbar Assessment Lumbar Assessment: Exceptions to [facility] Postural Control Postural Control: Deficits on evaluation Postural Limitations: due to back precautions   Balance Dynamic Sitting Balance Dynamic Sitting - Balance Support: Feet supported;No upper extremity supported Dynamic Sitting - Level of Assistance: 5: Stand by assistance Dynamic Sitting - Balance Activities: Forward lean/weight shifting  Extremity/Trunk Assessment RUE Assessment RUE Assessment: Within Functional Limits LUE Assessment LUE Assessment: Within Functional Limits  FIM:  FIM - Bathing Bathing: 0: Activity did not occur FIM - Upper Body Dressing/Undressing Upper body dressing/undressing: 0: Activity did not occur FIM - Lower Body Dressing/Undressing Lower body dressing/undressing: 0: Activity did not occur FIM - Toileting Toileting steps completed by patient: Performs perineal hygiene Toileting Assistive Devices: Toilet Aid/prosthesis/orthosis;Grab bar or rail for support Toileting: 3: Mod-Patient completed 2 of 3  steps FIM - Banker Devices: Arm rests;Bed rails Bed/Chair Transfer: 4: Supine > Sit: Min A (steadying Pt. > 75%/lift 1 leg);2: Bed > Chair or W/C: Max A (lift and lower assist) FIM - Diplomatic Services operational officer Devices: Grab bars Toilet Transfers: 2-To toilet/BSC: Max A (lift and lower assist);2-From toilet/BSC: Max A (lift and lower assist) FIM - Tub/Shower Transfers Tub/shower Transfers: 0-Activity did not occur or was simulated   Refer to Care Plan for Long Term Goals  Recommendations for other services: None  Discharge Criteria: Patient will be discharged from OT if patient refuses treatment 3 consecutive times without medical reason, if treatment goals not met, if there is a change in medical status, if patient makes no progress towards goals or if patient is discharged from hospital.  The above assessment, treatment plan, treatment alternatives and goals were discussed and mutually agreed upon: by patient  Second session: Time: 1100-1130 Time Calculation (min):  30 min  Pain Assessment: No pain  Skilled Therapeutic Interventions: ADL-retraining with emphasis on dynamic sitting balance with adherence to back precautions during ADL, transfers, and bed mobility.   Patient able to complete seated upper body bathing and dressing with min assist to fasten bra and verbal cues to maintain back precaution against twisting and arching her back.   Patient completed partial stand pivot transfer to return to bed with max assist for transfer and mod assist to lift both legs from sitting to supine.   Patient required mod assist for bed mobility rolling to her left and toward head of bed but demonstrated good effort and attention to precautions throughout session.   See FIM for current functional status  Therapy/Group: Individual Therapy  Mikiah Demond 09/09/2013, 12:57 PM

## 2013-09-09 NOTE — Progress Notes (Signed)
Physical Therapy Session Note  Patient Details  Name: Sherry Wong MRN: [id number] Date of Birth: July 10, 1956  Today's Date: 09/09/2013 Time: 1400-1445 Time Calculation (min): 45 min   Skilled Therapeutic Interventions/Progress Updates:  Pt reporting need to urinate upon entry, unable to safely get to toilet in time due to pt weakness and urgency. Required min physical assist to roll (cues for back precautions) and max assist for clothing and brief. Scooting transfer mod/max assist with cues for sequencing, pt having difficulty clearing buttocks therefore wheelchair had to be stabilized to prevent sliding. Wheelchair propulsion x 200' continuous with min assist only for tight space negotiation.   Therapy Documentation Precautions:  Precautions Precautions: Back;Fall Precaution Booklet Issued: Yes (comment) Precaution Comments: Pt able to recall 2 of 3 Pain: No c/o  See FIM for current functional status  Therapy/Group: Individual Therapy   Wilhemina Bonito 09/09/2013, 3:19 PM

## 2013-09-09 NOTE — Progress Notes (Signed)
Patient ID: Sherry Wong, female   DOB: 11-08-1955, 57 y.o.   MRN: [id number] Stable. Minimal drainage in wound. No fever. Upset about her husband health.

## 2013-09-09 NOTE — H&P (Signed)
Physical Medicine and Rehabilitation Admission H&P  CC: Cauda equina syndrome.  HPI: Sherry Wong is a 57 y.o. female with history of HTN, chronic back pain with radiation to BLE due to L4-5 and L5-S1 DDD. Patient elected to undergo L4-5 lam and L4/5 and L5/S1 disectomy with decompression of thecal sac on 08/25/13. Post op with paraparesis due to CSF leak and patient taken back to OR later that evening for exploration of wound with evacuation of epidural hematoma. Post op ABLA noted with hgb-8.6. On bedrest till 08/30/13 and now able to to flex knees, has flickering movement in toes and has had improvement in sensation BLE. Urine output improving and BP meds on hold due to hypotension. On 12/03, patient with seizure episode and was found to have CSF leak therefore patient taken to OR for wound debridement and lumbar drain placement. CT head without acute abnormality. She was placed on bedrest and lumbar drain d/c 12/07. HOB being slowly increased and  Review of Systems  HENT: Negative for hearing loss.  Eyes: Positive for blurred vision (occasionally with positional changes). Negative for double vision.  Cardiovascular: Positive for orthopnea.  Gastrointestinal: Positive for abdominal pain and constipation. Negative for nausea and vomiting.  Genitourinary: Positive for urgency and frequency.  Incontinence X years  Musculoskeletal: Positive for back pain and myalgias.  Neurological: Positive for dizziness (occasionally with positional changes. ), sensory change, focal weakness and weakness. Negative for headaches.   Past Medical History   Diagnosis  Date   .  PONV (postoperative nausea and vomiting)    .  Hypertension      takes Losartan daily   .  Cough    .  Sinus drainage    .  Pneumonia      hx of 15+yrs ago   .  Chronic back pain      ddd/lumbago,and radiculopathy   .  Arthritis    .  Gout      hx of   .  GERD (gastroesophageal reflux disease)      takes Protonix daily   .  H/O  hiatal hernia    .  Gastric ulcer    .  Ulcerative colitis    .  IBS (irritable bowel syndrome)    .  Constipation    .  Diarrhea    .  Hx of colonic polyps    .  Urinary incontinence    .  Nocturia    .  Hypothyroidism      takes Levothyroxine daily   .  Depression    .  Anxiety      takes celexa daily    Past Surgical History   Procedure  Laterality  Date   .  Cysts removed from ovary     .  Cholecystectomy     .  Dilation and curettage of uterus     .  Breast surgery       left breast lumpectomy   .  Breast biopsy       right   .  Colonoscopy     .  Esophagogastroduodenoscopy     .  Lumbar laminectomy/decompression microdiscectomy   12/03/2011     Procedure: LUMBAR LAMINECTOMY/DECOMPRESSION MICRODISCECTOMY 2 LEVELS; Surgeon: Karn Cassis, MD; Location: [facility]; Service: Neurosurgery; Laterality: Right; Right Lumbar four-five Lumbar five sacral one Foraminotomies   .  Transforaminal lumbar interbody fusion (tlif) with pedicle screw fixation 2 level  N/A  08/25/2013  Procedure: Lumbar four-five, Lumbar five-Sacral one Transforaminal Lumbar Interbody Fusion ; Surgeon: Karn Cassis, MD; Location: [facility]; Service: Neurosurgery; Laterality: N/A;   .  Hematoma evacuation  N/A  08/25/2013     Procedure: EVACUATION OF LUMBAR HEMATOMA; Surgeon: Karn Cassis, MD; Location: [facility]; Service: Neurosurgery; Laterality: N/A;   .  Placement of lumbar drain  N/A  09/01/2013     Procedure: PLACEMENT OF LUMBAR DRAIN; Surgeon: Karn Cassis, MD; Location: [facility]; Service: Neurosurgery; Laterality: N/A;   .  Lumbar wound debridement  N/A  09/01/2013     Procedure: LUMBAR WOUND DEBRIDEMENT; Surgeon: Karn Cassis, MD; Location: [facility]; Service: Neurosurgery; Laterality: N/A;    Family History   Problem  Relation  Age of Onset   .  Anesthesia problems  Sister    .  Hypotension  Neg Hx    .  Malignant hyperthermia  Neg Hx    .  Pseudochol deficiency   Neg Hx    .  Anesthesia problems  Mother     Social History: Married. Disabled due to back issues since 11/2012. She reports that she has never smoked. She has never used smokeless tobacco. She reports that she drinks alcohol. She reports that she does not use illicit drugs.  Allergies   Allergen  Reactions   .  Codeine  Nausea Only    Medications Prior to Admission   Medication  Sig  Dispense  Refill   .  citalopram (CELEXA) 20 MG tablet  Take 20 mg by mouth daily.     .  diazepam (VALIUM) 5 MG tablet  Take 5 mg by mouth every 8 (eight) hours as needed for muscle spasms.     [name]  HYDROcodone-acetaminophen (NORCO) 10-325 MG per tablet  Take 1 tablet by mouth every 6 (six) hours as needed (for pain).     .  hyoscyamine (LEVSIN, ANASPAZ) 0.125 MG tablet  Take 0.125 mg by mouth 3 (three) times daily.     [name]  levothyroxine (SYNTHROID, LEVOTHROID) 150 MCG tablet  Take 150 mcg by mouth daily.     [name]  losartan-hydrochlorothiazide (HYZAAR) 50-12.5 MG per tablet  Take 1 tablet by mouth daily.     .  pantoprazole (PROTONIX) 40 MG tablet  Take 40 mg by mouth daily.     [name]  albuterol (PROVENTIL HFA;VENTOLIN HFA) 108 (90 BASE) MCG/ACT inhaler  Inhale 2 puffs into the lungs 2 (two) times daily.      Home:  Home Living  Family/patient expects to be discharged to:: Inpatient rehab  Living Arrangements: Spouse/significant other  Additional Comments: pt lives in 1 story home with 5 steps to enter with bilat HR. Pt was indep PTA and was not using any DME.  Functional History:   Functional Status:  Mobility:  Bed Mobility  Bed Mobility: Rolling Left;Left Sidelying to Sit;Sit to Sidelying Left;Scooting to [facility]  Rolling Right: 4: Min assist;With rail  Rolling Left: 4: Min assist;With rail  Right Sidelying to Sit: 4: Min assist  Left Sidelying to Sit: 4: Min guard;HOB flat;With rails  Sitting - Scoot to Edge of Bed: 3: Mod assist  Sit to Sidelying Left: 3: Mod assist;HOB flat  Scooting to HOB: 1: +2 Total  assist  Scooting to [facility]: Patient Percentage: 0%  Transfers  Transfers: Sit to Stand;Stand to Sit  Sit to Stand: 1: +2 Total assist  Sit to Stand: Patient Percentage: 50%  Stand to Sit: 1: +  2 Total assist  Stand to Sit: Patient Percentage: 50%  Stand Pivot Transfers: 1: +2 Total assist  Stand Pivot Transfers: Patient Percentage: 40%  Ambulation/Gait  Ambulation/Gait Assistance: Not tested (comment)   ADL:  ADL  Eating/Feeding: Independent  Where Assessed - Eating/Feeding: Chair  Grooming: Wash/dry hands;Wash/dry face;Teeth care;Supervision/safety  Where Assessed - Grooming: Unsupported sitting  Upper Body Bathing: Minimal assistance  Where Assessed - Upper Body Bathing: Unsupported sitting  Lower Body Bathing: +1 Total assistance (educated on availability for AE for LB ADL)  Where Assessed - Lower Body Bathing: Unsupported sitting;Supported sit to stand  Upper Body Dressing: Set up  Where Assessed - Upper Body Dressing: Unsupported sitting  Lower Body Dressing: +1 Total assistance  Where Assessed - Lower Body Dressing: Unsupported sitting;Supported sit to stand  Toilet Transfer: +2 Total assistance  Toilet Transfer Method: Stand pivot  Equipment Used: Gait belt  Transfers/Ambulation Related to ADLs: did not transfer pt today, worked on sit<>stand as precursor  ADL Comments: Attempted to cross legs to access socks, will need AE.  Cognition:  Cognition  Overall Cognitive Status: Within Functional Limits for tasks assessed  Orientation Level: Oriented X4  Cognition  Arousal/Alertness: Awake/alert  Behavior During Therapy: WFL for tasks assessed/performed  Overall Cognitive Status: Within Functional Limits for tasks assessed  Physical Exam:  Blood pressure 100/83, pulse 65, temperature 97.4 F (36.3 C), temperature source Oral, resp. rate 18, height 5\' 8"  (1.727 m), weight 112 kg (246 lb 14.6 oz), SpO2 97.00%.    Constitutional: She is oriented to person, place, and time. She  appears well-developed and well-nourished.  HENT:  Head: Normocephalic and atraumatic.  Eyes: Conjunctivae are normal. Pupils are equal, round, and reactive to light.  Neck: Neck supple.  Cardiovascular: Normal rate and regular rhythm.  Respiratory: Effort normal and breath sounds normal. No respiratory distress. She has no wheezes.  GI: Soft. Bowel sounds are normal. She exhibits distension. There is no tenderness. There is no rebound.  Musculoskeletal: She exhibits edema.  Neurological: She is alert and oriented to person, place, and time.  Decreased sensation 1/2 from lateral thigh to foot and back to buttocks. Can sense touch around buttocks (is sensing flatus also). 2-2+/5 at HF, Right KE 1+, L knee 2, right ankle 1, left ankle 2-.  dtr's trace to absent  Skin: Skin is warm and dry.  Moderate amount of sero-sanguinous drainage (on underpad) from lower half of sutured back incision.  Psychiatric: She has a normal mood and affect. Her behavior is normal. Judgment and thought content normal.   No results found for this or any previous visit (from the past 48 hour(s)).  No results found.   Post Admission Physician Evaluation:  1. Functional deficits secondary to cauda equina syndrome. 2. Patient is admitted to receive collaborative, interdisciplinary care between the physiatrist, rehab nursing staff, and therapy team. 3. Patient's level of medical complexity and substantial therapy needs in context of that medical necessity cannot be provided at a lesser intensity of care such as a SNF. 4. Patient has experienced substantial functional loss from his/her baseline which was documented above under the "Functional History" and "Functional Status" headings. Judging by the patient's diagnosis, physical exam, and functional history, the patient has potential for functional progress which will result in measurable gains while on inpatient rehab. These gains will be of substantial and practical use  upon discharge in facilitating mobility and self-care at the household level. 5. Physiatrist will provide 24 hour management of medical needs  as well as oversight of the therapy plan/treatment and provide guidance as appropriate regarding the interaction of the two. 6. 24 hour rehab nursing will assist with bladder management, bowel management, safety, skin/wound care, disease management, medication administration, pain management and patient education and help integrate therapy concepts, techniques,education, etc. 7. PT will assess and treat for/with: Lower extremity strength, range of motion, stamina, balance, functional mobility, safety, adaptive techniques and equipment, NMR, pain mgt 8. . Goals are: mod I . 9. OT will assess and treat for/with: ADL's, functional mobility, safety, upper extremity strength, adaptive techniques and equipment, NMR, pain mgt. Goals are: mod I to supervision. 10. SLP will assess and treat for/with: n/a. Goals are: n/a. 11. Case Management and Social Worker will assess and treat for psychological issues and discharge planning. 12. Team conference will be held weekly to assess progress toward goals and to determine barriers to discharge. 13. Patient will receive at least 3 hours of therapy per day at least 5 days per week. 14. ELOS: 15-20 days  15. Prognosis: excellent   Medical Problem List and Plan:  Cauda equina syndrome with B/B issues.  1. DVT Prophylaxis/Anticoagulation: Pharmaceutical: Lovenox   -check dopplers 2. Chronic back pain/Pain Management: prn medcications effective.  3. H/o depression with anxiety/Mood: Provide ego support. LCSW to follow for evaluation and support with recent lifestyle changes.  4. Neuropsych: This patient is capable of making decisions on her own behalf.  5. HTN: On HCTZ and cozaar. Decrease cozaar to 25 and set parameters on BP meds. Will check orthostatic BPs--Patient was on prolonged bedrest as well as intermittent low blood  pressures. Titrate medications as needed to avoid orthostasis.  6. ABLA: hgb 9.5, continue iron supplement.  7. Bowel and bladder:  bowel program. Suppository today and daily for now.   -sorbitol today, enema if needed  -Scan bladder to monitor voiding function.  8. Wound: continue current dressing changes   Ranelle Oyster, MD, [facility] Physical Medicine & Rehabilitation   09/08/2013

## 2013-09-09 NOTE — Evaluation (Signed)
Physical Therapy Assessment and Plan  Patient Details  Name: Sherry Wong MRN: [id number] Date of Birth: 09-Nov-1955  PT Diagnosis: Abnormal posture, Coordination disorder, Difficulty walking, Impaired sensation, Low back pain, Muscle weakness and Paraplegia Rehab Potential: Good ELOS: ~21 days   Today's Date: 09/09/2013 Time: 4696-2952 Time Calculation (min): 65 min  Problem List:  Patient Active Problem List   Diagnosis Date Noted  . Cauda equina syndrome 09/08/2013  . Morbid obesity 08/26/2013  . HTN (hypertension) 08/26/2013  . Hypoxemia 08/26/2013  . Unspecified hypothyroidism 08/26/2013  . Lumbar degenerative disc disease 08/25/2013    Past Medical History:  Past Medical History  Diagnosis Date  . PONV (postoperative nausea and vomiting)   . Hypertension     takes Losartan daily  . Cough   . Sinus drainage   . Pneumonia     hx of 15+yrs ago  . Chronic back pain     ddd/lumbago,and radiculopathy  . Arthritis   . Gout     hx of  . GERD (gastroesophageal reflux disease)     takes Protonix daily  . H/O hiatal hernia   . Gastric ulcer   . Ulcerative colitis   . IBS (irritable bowel syndrome)   . Constipation   . Diarrhea   . Hx of colonic polyps   . Urinary incontinence   . Nocturia   . Hypothyroidism     takes Levothyroxine daily  . Depression   . Anxiety     takes celexa daily   Past Surgical History:  Past Surgical History  Procedure Laterality Date  . Cysts removed from ovary    . Cholecystectomy    . Dilation and curettage of uterus    . Breast surgery      left breast lumpectomy  . Breast biopsy      right  . Colonoscopy    . Esophagogastroduodenoscopy    . Lumbar laminectomy/decompression microdiscectomy  12/03/2011    Procedure: LUMBAR LAMINECTOMY/DECOMPRESSION MICRODISCECTOMY 2 LEVELS;  Surgeon: Karn Cassis, MD;  Location: [facility];  Service: Neurosurgery;  Laterality: Right;  Right Lumbar four-five Lumbar five sacral one  Foraminotomies  . Transforaminal lumbar interbody fusion (tlif) with pedicle screw fixation 2 level N/A 08/25/2013    Procedure:  Lumbar four-five, Lumbar five-Sacral one Transforaminal Lumbar Interbody Fusion ;  Surgeon: Karn Cassis, MD;  Location: [facility];  Service: Neurosurgery;  Laterality: N/A;  . Hematoma evacuation N/A 08/25/2013    Procedure: EVACUATION OF LUMBAR HEMATOMA;  Surgeon: Karn Cassis, MD;  Location: [facility];  Service: Neurosurgery;  Laterality: N/A;  . Placement of lumbar drain N/A 09/01/2013    Procedure: PLACEMENT OF LUMBAR DRAIN;  Surgeon: Karn Cassis, MD;  Location: [facility];  Service: Neurosurgery;  Laterality: N/A;  . Lumbar wound debridement N/A 09/01/2013    Procedure: LUMBAR WOUND DEBRIDEMENT;  Surgeon: Karn Cassis, MD;  Location: [facility];  Service: Neurosurgery;  Laterality: N/A;    Assessment & Plan Clinical Impression: [name] is a 57 y.o. female with history of HTN, chronic back pain with radiation to BLE due to L4-5 and L5-S1 DDD. Patient elected to undergo L4-5 lam and L4/5 and L5/S1 disectomy with decompression of thecal sac on 08/25/13. Post op with paraparesis due to CSF leak and patient taken back to OR later that evening for exploration of wound with evacuation of epidural hematoma. Post op ABLA noted with hgb-8.6. On bedrest till 08/30/13. On 12/03,  patient with seizure episode and was found to have CSF leak therefore patient taken to OR for wound debridement and lumbar drain placement. CT head without acute abnormality. She was placed on bedrest and lumbar drain d/c 12/07. Patient transferred to CIR on 09/08/2013 .   Patient currently requires max/+2 total with mobility secondary to muscle weakness and muscle paralysis, decreased cardiorespiratoy endurance, impaired timing and sequencing, abnormal tone, unbalanced muscle activation and decreased coordination and decreased sitting balance, decreased standing balance,  decreased postural control, decreased balance strategies and difficulty maintaining precautions.  Prior to hospitalization, patient was modified independent  with mobility and lived with Spouse in a House home.  Home access is 5Stairs to enter.  Patient will benefit from skilled PT intervention to maximize safe functional mobility, minimize fall risk and decrease caregiver burden for planned discharge home with 24 hour supervision/assist.  Anticipate patient will benefit from follow up [facility] at discharge.  PT - End of Session Endurance Deficit: Yes PT Assessment Rehab Potential: Good Barriers to Discharge: Decreased caregiver support (disabled husband) PT Patient demonstrates impairments in the following area(s): Balance;Endurance;Motor;Pain;Sensory PT Transfers Functional Problem(s): Bed Mobility;Bed to Chair;Car;Furniture PT Locomotion Functional Problem(s): Ambulation;Wheelchair Mobility;Stairs PT Plan PT Intensity: Minimum of 1-2 x/day ,45 to 90 minutes PT Frequency: 5 out of 7 days PT Duration Estimated Length of Stay: ~21 days PT Treatment/Interventions: Ambulation/gait training;Balance/vestibular training;Discharge planning;Community reintegration;DME/adaptive equipment instruction;Functional electrical stimulation;Functional mobility training;Patient/family education;Splinting/orthotics;Therapeutic Exercise;UE/LE Coordination activities;Therapeutic Activities;Skin care/wound management;Pain management;Neuromuscular re-education;Psychosocial support;Stair training;UE/LE Strength taining/ROM;Wheelchair propulsion/positioning PT Transfers Anticipated Outcome(s): Supervision/modified independent PT Locomotion Anticipated Outcome(s): Min assist PT Recommendation Recommendations for Other Services: Neuropsych consult Follow Up Recommendations: Home health PT;24 hour supervision/assistance Patient destination: Home Equipment Recommended: To be determined  Skilled Therapeutic Intervention Pt  having bowel incontinence (small, watery) with every sit <> stand. Pt cleaned and brief applied however second brief had to be applied due to more incontinence with standing. Overall 4 sit <> stands from wheelchair performed with max/+2 total assist PT facilitation of hip extensors. Pt has difficulty reaching full upright stance. Lightheaded but BP WFL. Very anxious initially, somewhat resolved during session.  PT Evaluation Precautions/Restrictions  BACK precautions General   Vital SignsTherapy Vitals Pulse Rate: 74 Resp: 20 BP: 101/73 mmHg Patient Position, if appropriate: Sitting Oxygen Therapy SpO2: 95 % O2 Device: Nasal cannula O2 Flow Rate (L/min): 2 L/min Pain Pain Assessment Pain Assessment: No/denies pain Pain Score: 0-No pain Faces Pain Scale: Hurts a little bit Pain Type: Acute pain Pain Location: Back Pain Orientation: Lower Pain Descriptors / Indicators: Aching;Sore Pain Onset:  (after doctor removed bandage) Pain Intervention(s): RN made aware Home Living/Prior Functioning Home Living Available Help at Discharge: Family;Available 24 hours/day (but has CHF, with pacemaker and getting ready to go on transplant list) Type of Home: House Home Access: Stairs to enter [employer] of Steps: 5 Entrance Stairs-Rails: Left;Right (can't reach at same time) Home Layout: One level  Lives With: Spouse Prior Function Level of Independence: Independent with basic ADLs  Able to Take Stairs?: Yes Driving: Yes Vocation: On disability Vocation Requirements: on SSDI, since 04/2012 Leisure: Hobbies-yes (Comment) Comments: likes to walk a lot, likes to go to swimming pool, go for drives and shopping.   Cognition Arousal/Alertness: Awake/alert Orientation Level: Oriented X4 Attention: Alternating Alternating Attention: Appears intact Memory:  (unclear ) Awareness: Appears intact Problem Solving:  (needs further assessment ) Safety/Judgment: Appears intact (but  needs further assessment ) Comments: VERY ANXIOUS Sensation Sensation Light Touch: Impaired by gross assessment;Impaired Detail (  Bil. LE) Light Touch Impaired Details: Impaired LLE;Impaired RLE (Rt. LE more impaired than Lt./ Lt deficits lateral thigh down to foot, Rt. LE is mostly numb all over. Reports feet are the worst. ) Stereognosis: Appears Intact Hot/Cold: Appears Intact Proprioception: Impaired Detail Proprioception Impaired Details: Absent RLE;Impaired LLE Coordination Gross Motor Movements are Fluid and Coordinated: No Fine Motor Movements are Fluid and Coordinated: No (tremors of bil. UE - likely from anxiety) Coordination and Movement Description: Impaired bil. LE coordination  Motor  Motor Motor:  (Cauda equina syndrome) Motor - Skilled Clinical Observations: Impaired functional mobility of bil. LEs due to spinal cord injury (cauda equina)  Mobility Bed Mobility Bed Mobility:  (pt sitting in wheelcair upon entry) Transfers Transfers: Yes Sit to Stand: 1: +2 Total assist;With upper extremity assist;From chair/3-in-1;2: Max assist Sit to Stand Details (indicate cue type and reason): +2 total assist/max assist cues for UE/LE placement, anterior translation into standing. Knee control modulated to prevent buckling. Delayed hip extension Stand to Sit: 1: +2 Total assist;With upper extremity assist;To chair/3-in-1 Stand to Sit Details: Decreased control of descent. Locomotion  Ambulation Ambulation:  (attempted) Ambulation/Gait Assistance: 1: +2 Total assist Ambulation Distance (Feet):  (<1') Assistive device: Rolling walker Ambulation/Gait Assistance Details: Pt attempted ambulation, two persons required. Unable to take steps due to weakness and uncontrolled bowel movements with exertion. Gait Gait: No (unable to accurately assess. ) Wheelchair Mobility Wheelchair Mobility: Yes Wheelchair Assistance: 5: Supervision Wheelchair Propulsion: Both upper  extremities Wheelchair Parts Management: Needs assistance Distance: 60  Trunk/Postural Assessment  Cervical Assessment Cervical Assessment: Exceptions to [facility] (forward head) Thoracic Assessment Thoracic Assessment: Within Functional Limits Lumbar Assessment Lumbar Assessment: Exceptions to [facility] Postural Control Postural Control: Deficits on evaluation Postural Limitations: due to back precautions  Balance Balance Balance Assessed: Yes Static Sitting Balance Static Sitting - Balance Support: Bilateral upper extremity supported;Feet supported Static Sitting - Level of Assistance: 5: Stand by assistance Static Standing Balance Static Standing - Balance Support: Bilateral upper extremity supported Static Standing - Level of Assistance: 2: Max assist Static Standing - Comment/# of Minutes: able to stand ~1 min max.  Extremity Assessment      RLE Assessment RLE Assessment: Exceptions to [facility] RLE PROM (degrees) RLE Overall PROM Comments: WFL RLE Strength RLE Overall Strength Comments: Significantly impaired hip extension and dorsiflexion LLE Assessment LLE Assessment: Exceptions to WFL LLE PROM (degrees) LLE Overall PROM Comments: WFL LLE Strength LLE Overall Strength Comments: significantly impaired hip extension and dorsiflexion  FIM:  FIM - Locomotion: Wheelchair Distance: 60 FIM - Locomotion: Ambulation Ambulation/Gait Assistance: 1: +2 Total assist   Refer to Care Plan for Long Term Goals  Recommendations for other services: Neuropsych  Discharge Criteria: Patient will be discharged from PT if patient refuses treatment 3 consecutive times without medical reason, if treatment goals not met, if there is a change in medical status, if patient makes no progress towards goals or if patient is discharged from hospital.  The above assessment, treatment plan, treatment alternatives and goals were discussed and mutually agreed upon: by patient  Wilhemina Bonito 09/09/2013, 12:56 PM

## 2013-09-09 NOTE — Progress Notes (Signed)
Bilateral lower extremity venous duplex:  No evidence of DVT, superficial thrombosis, or Baker's Cyst.   

## 2013-09-10 ENCOUNTER — Inpatient Hospital Stay (HOSPITAL_COMMUNITY): Payer: BC Managed Care – PPO | Admitting: Physical Therapy

## 2013-09-10 ENCOUNTER — Inpatient Hospital Stay (HOSPITAL_COMMUNITY): Payer: BC Managed Care – PPO

## 2013-09-10 LAB — URINE CULTURE: Colony Count: >=100000

## 2013-09-10 NOTE — Progress Notes (Signed)
Subjective/Complaints: Had a reasonable night. Pain controlled. Wants to take a shower. Had bm yesterday A 12 point review of systems has been performed and if not noted above is otherwise negative.   Objective: Vital Signs: Blood pressure 119/85, pulse 69, temperature 97.8 F (36.6 C), temperature source Oral, resp. rate 18, height 5\' 8"  (1.727 m), weight 111.9 kg (246 lb 11.1 oz), SpO2 99.00%. No results found.  Recent Labs  09/09/13 0535  WBC 9.9  HGB 9.5*  HCT 29.9*  PLT 280    Recent Labs  09/09/13 0535  NA 140  K 3.8  CL 102  GLUCOSE 99  BUN 18  CREATININE 0.68  CALCIUM 9.0   CBG (last 3)  No results found for this basename: GLUCAP,  in the last 72 hours  Wt Readings from Last 3 Encounters:  09/08/13 111.9 kg (246 lb 11.1 oz)  09/07/13 112 kg (246 lb 14.6 oz)  09/07/13 112 kg (246 lb 14.6 oz)    Physical Exam:  Constitutional: She is oriented to person, place, and time. She appears well-developed and well-nourished.  HENT:  Head: Normocephalic and atraumatic.  Eyes: Conjunctivae are normal. Pupils are equal, round, and reactive to light.  Neck: Neck supple.  Cardiovascular: Normal rate and regular rhythm.  Respiratory: Effort normal and breath sounds normal. No respiratory distress. She has no wheezes.  GI: Soft. Bowel sounds are normal. She exhibits distension. There is no tenderness. There is no rebound.  Musculoskeletal: She exhibits trace edema.  Neurological: She is alert and oriented to person, place, and time.  Decreased sensation 1/2 from lateral thigh to foot and back to buttocks. Can sense touch around buttocks (is sensing flatus also). 2-2+/5 at HF, Right KE 1+, L knee 2, right ankle 1, left ankle 2-. dtr's trace to absent  Skin: Skin is warm and dry.  Mild to moderate amount of sero-sanguinous drainage from back incision.  Psychiatric: She has a normal mood and affect. Her behavior is normal. Judgment and thought content normal.     Assessment/Plan: 1. Functional deficits secondary to cauda equina syndrome which require 3+ hours per day of interdisciplinary therapy in a comprehensive inpatient rehab setting. Physiatrist is providing close team supervision and 24 hour management of active medical problems listed below. Physiatrist and rehab team continue to assess barriers to discharge/monitor patient progress toward functional and medical goals. FIM: FIM - Bathing Bathing: 0: Activity did not occur  FIM - Upper Body Dressing/Undressing Upper body dressing/undressing: 0: Activity did not occur FIM - Lower Body Dressing/Undressing Lower body dressing/undressing: 0: Activity did not occur  FIM - Toileting Toileting steps completed by patient: Performs perineal hygiene Toileting Assistive Devices: Toilet Aid/prosthesis/orthosis;Grab bar or rail for support Toileting: 3: Mod-Patient completed 2 of 3 steps  FIM - Diplomatic Services operational officer Devices: Grab bars Toilet Transfers: 2-To toilet/BSC: Max A (lift and lower assist);2-From toilet/BSC: Max A (lift and lower assist)  FIM - Press photographer Assistive Devices: Arm rests;Bed rails Bed/Chair Transfer: 4: Supine > Sit: Min A (steadying Pt. > 75%/lift 1 leg);2: Bed > Chair or W/C: Max A (lift and lower assist);3: Sit > Supine: Mod A (lifting assist/Pt. 50-74%/lift 2 legs);2: Chair or W/C > Bed: Max A (lift and lower assist)  FIM - Locomotion: Wheelchair Distance: 60 Locomotion: Wheelchair: 2: Travels 50 - 149 ft with minimal assistance (Pt.>75%) FIM - Locomotion: Ambulation Locomotion: Ambulation Assistive Devices: Designer, industrial/product Ambulation/Gait Assistance: 1: +2 Total assist Locomotion: Ambulation: 1: Two  helpers  Comprehension Comprehension Mode: Auditory Comprehension: 5-Understands complex 90% of the time/Cues < 10% of the time  Expression Expression Mode: Verbal Expression: 6-Expresses complex ideas: With extra  time/assistive device  Social Interaction Social Interaction: 6-Interacts appropriately with others with medication or extra time (anti-anxiety, antidepressant).  Problem Solving Problem Solving: 5-Solves basic problems: With no assist  Memory Memory: 7-Complete Independence: No helper  Cauda equina syndrome with B/B issues.  1. DVT Prophylaxis/Anticoagulation: Pharmaceutical: Lovenox  -dopplers negative 2. Chronic back pain/Pain Management: prn medcications effective.  3. H/o depression with anxiety/Mood: Provide ego support. LCSW to follow for evaluation and support with recent lifestyle changes.  4. Neuropsych: This patient is capable of making decisions on her own behalf.  5. HTN: On HCTZ and cozaar. Decreased cozaar to 25 and set parameters on BP meds to avoid orthostasis. 6. ABLA: hgb 9.5, continue iron supplement.  7. Bowel and bladder: bowel program. Suppository today and daily for now.  -typically moved bowels daily in the am at home    8. Wound: continue current dressing changes  -may shower if the wound is covered, water-tight (opsite)   LOS (Days) 2 A FACE TO FACE EVALUATION WAS PERFORMED  Sherry Wong T 09/10/2013 9:10 AM

## 2013-09-10 NOTE — Progress Notes (Signed)
Physical Therapy Session Note  Patient Details  Name: Sherry Wong MRN: [id number] Date of Birth: 03-21-1956  Today's Date: 09/10/2013 Time: 2130-8657 Time Calculation (min): 28 min  Skilled Therapeutic Interventions/Progress Updates:    Session focused on sit <> stands in parallel bars. Utilized sheet behind buttocks to assist with hip extension into standing, max assist. Pt able to reach full stand but only able to maintain standing for seconds at a time due to weakness and back pain. Cues for back precautions with activity.  Scoot transfer wheelchair > bed with max assist but improved ability to clear buttocks noted. Sit > supine mod assist for bil. LEs.     Therapy Documentation Precautions:  Precautions Precautions: Back;Fall Precaution Booklet Issued: Yes (comment) Precaution Comments: Pt able to recall 2 of 3 Pain: Pain Assessment Faces Pain Scale: Hurts little more Pain Type: Acute pain Pain Location: Back Pain Orientation: Lower Pain Descriptors / Indicators: Aching Pain Onset: On-going (after therapies ) Pain Intervention(s): Repositioned  See FIM for current functional status  Therapy/Group: Individual Therapy  Wilhemina Bonito 09/10/2013, 3:16 PM

## 2013-09-10 NOTE — Progress Notes (Signed)
Patient require bowel program with suppository daily at 0600.  The last today patient has had medium bowel movement within an hour of suppository and no accidents during the day.  Patient is voiding regular amount and PVRs has been normal, no I & O cathing require for the last 2 days.  Will continue to monitor.

## 2013-09-10 NOTE — Progress Notes (Signed)
Occupational Therapy Session Note  Patient Details  Name: Sherry Wong MRN: [id number] Date of Birth: 07/22/1956  Today's Date: 09/10/2013 Time: 0830-0930 Time Calculation (min): 60 min  Short Term Goals: Week 1:  OT Short Term Goal 1 (Week 1): Patient will complete upper body bathing and dressing with supervision to maintain back precautions and setup assistance OT Short Term Goal 2 (Week 1): Patient will complete transfer bed <> w/c with mod assist OT Short Term Goal 3 (Week 1): Patient will demonstrate ability to use toilet aid for toilet hygiene with min assist for thoroughness OT Short Term Goal 4 (Week 1): Patient will complete transfer from w/c to standard tub and tub bench with max assist to manage LE OT Short Term Goal 5 (Week 1): Patient will maintain supported static standing balance during ADL with steadying assist  Skilled Therapeutic Interventions: ADL-retraining with emphasis on use of AE to improve independence with lower body bathing and dressing, transfers, improved activity tolerance, and adherence to back precautions.   Patient performed seated bathing of upper and lower body using LH sponge for lower legs and feet with buttocks and perineal area deferred due to recent assisted change from toileting.  Patient attempted to stand supported with both arms 3 times for assist with lower body dressing but could not generate sufficient strength to rise, with max assist X 1.   OT assisted patient with max assist bed transfer and then demonstrated supine dressing skills this session with mod assist to pull up pants.  Patient fatigued from session and requested continue recovery in bed until next therapy session.  Therapy Documentation Precautions:  Precautions Precautions: Back;Fall Precaution Booklet Issued: Yes (comment) Precaution Comments: Pt able to recall 2 of 3  Pain: Pain Assessment Pain Assessment: 0-10 Pain Score: 7  Pain Type: Acute pain Pain Location:  Back Pain Orientation: Right;Left Pain Descriptors / Indicators: Aching Pain Frequency: Constant Pain Onset: On-going Patients Stated Pain Goal: 3 Pain Intervention(s): Medication (See eMAR)  See FIM for current functional status  Therapy/Group: Individual Therapy  Second session: Time: 1300-1400  Time Calculation (min):  60 min  Pain Assessment: No pain  Skilled Therapeutic Interventions: Therapeutic activities with emphasis on improved LE strength (leg extension while supine in bed), improved endurance using Sci-Fit (upper arm ergometry), functional transfers, weight-shifting, adherence to back precautions, and education/training on use of AE (sock aid) for dressing.   Patient was encountered in bed where she reported discomfort from use of TED (large, long) resulted in her need to roll TEDs to her ankles using reacher.   OT provided total assist to don (large, short) TEDs with facilitation to flex/extend hips/knees against resistance (10 times each leg) as "warm-up" in prep for transfer out of bed.  Patient reported improved comfort from change in TEDs size and proceeded with bed mobility (lifting assist of only 1 leg required) and transfer to w/c with max assist.   Patient escorted to gym to complete 15 min of UE arm ergometry using SCI-Fit, level 1.5 and rated exercise as 11 on BORG scale.   Patient was then escorted back to her room for demo of sock aid.   Sitting at edge of bed, patient completed donning left sock using sock aid with only 1 cue required to problem-solve.   Patient returned to w/c, completing lateral scoot/squat transfer with contact guard (pt = 90%) and verbal cue to attend to back precaution against arching.  See FIM for current functional status  Therapy/Group: Individual Therapy  [facility] 09/10/2013, 12:46 PM

## 2013-09-10 NOTE — Progress Notes (Signed)
[facility] Individual Statement of Services  Patient Name:  LYNDA WANNINGER  Date:  09/10/2013  Welcome to the [facility].  Our goal is to provide you with an individualized program based on your diagnosis and situation, designed to meet your specific needs.  With this comprehensive rehabilitation program, you will be expected to participate in at least 3 hours of rehabilitation therapies Monday-Friday, with modified therapy programming on the weekends.  Your rehabilitation program will include the following services:  Physical Therapy (PT), Occupational Therapy (OT), 24 hour per day rehabilitation nursing, Therapeutic Recreation (TR), Neuropsychology, Case Management (Social Worker), Rehabilitation Medicine, Nutrition Services and Pharmacy Services  Weekly team conferences will be held on Tuesdays to discuss your progress.  Your Social Worker will talk with you frequently to get your input and to update you on team discussions.  Team conferences with you and your family in attendance may also be held.  Expected length of stay:  About 3 weeks  Overall anticipated outcome:  Minimal assistance  Depending on your progress and recovery, your program may change. Your Social Worker will coordinate services and will keep you informed of any changes. Your Social Worker's name and contact numbers are listed  below.  The following services may also be recommended but are not provided by the [facility]:   Driving Evaluations  Home Health Rehabiltiation Services  Outpatient Rehabilitation Services   Arrangements will be made to provide these services after discharge if needed.  Arrangements include referral to agencies that provide these services.  Your insurance has been verified to be:  [employer] Your primary doctor is:  Roma Kayser, [place]  Pertinent information will be shared with your doctor and your insurance  company.  Social Worker:  Staci Acosta, LCSW  [phone] or [phone]  Information discussed with and copy given to patient by: Elvera Lennox, 09/10/2013, 1:38 PM

## 2013-09-10 NOTE — Progress Notes (Signed)
Patient ID: Sherry Wong, female   DOB: 25-Apr-1956, 57 y.o.   MRN: [id number] Stable.

## 2013-09-10 NOTE — Progress Notes (Signed)
Physical Therapy Session Note  Patient Details  Name: Sherry Wong MRN: [id number] Date of Birth: [birth year]  Today's Date: 09/10/2013 Time: 0730-0830 Time Calculation (min): 60 min  Skilled Therapeutic Interventions/Progress Updates:    Much more relaxed today. Squat pivot transfer bed>wheelchair with max assist, cues for safety. Scoot transfer wheelchair <> commode heavy max assist, difficulty clearing buttocks. Sit <> stand to RW with max assist, standing balance with bil. UE support, cues and facilitation of bil. Hip extension. Extended time needed during session due to significant weakness of LEs and difficulty during transfers and repositioning in chair.  Mod verbal cues for maintaining back precautions throughout (tends to twist).   Therapy Documentation Precautions:  Precautions Precautions: Back;Fall Precaution Booklet Issued: Yes (comment) Precaution Comments: Pt able to recall 2 of 3 Pain: Pain Assessment Pain Assessment: 0-10 Pain Score: 5  Pain Location: Back Pain Intervention(s): Medication (See eMAR)  See FIM for current functional status  Therapy/Group: Individual Therapy  [name] 09/10/2013, 10:00 AM

## 2013-09-11 MED ORDER — CIPROFLOXACIN HCL 250 MG PO TABS
250.0000 mg | ORAL_TABLET | Freq: Two times a day (BID) | ORAL | Status: DC
  Administered 2013-09-11 (×2): 250 mg via ORAL
  Filled 2013-09-11 (×5): qty 1

## 2013-09-11 NOTE — Progress Notes (Signed)
Subjective/Complaints: Had a reasonable night. Pain controlled. Wants to take a shower. Had bm yesterday A 12 point review of systems has been performed and if not noted above is otherwise negative.   Objective: Vital Signs: Blood pressure 102/59, pulse 54, temperature 97.8 F (36.6 C), temperature source Oral, resp. rate 18, height 5\' 8"  (1.727 m), weight 111.9 kg (246 lb 11.1 oz), SpO2 98.00%. No results found.  Recent Labs  09/09/13 0535  WBC 9.9  HGB 9.5*  HCT 29.9*  PLT 280    Recent Labs  09/09/13 0535  NA 140  K 3.8  CL 102  GLUCOSE 99  BUN 18  CREATININE 0.68  CALCIUM 9.0   CBG (last 3)  No results found for this basename: GLUCAP,  in the last 72 hours  Wt Readings from Last 3 Encounters:  09/08/13 111.9 kg (246 lb 11.1 oz)  09/07/13 112 kg (246 lb 14.6 oz)  09/07/13 112 kg (246 lb 14.6 oz)    Physical Exam:  Constitutional: She is oriented to person, place, and time. She appears well-developed and well-nourished.  HENT:  Head: Normocephalic and atraumatic.  Eyes: Conjunctivae are normal. Pupils are equal, round, and reactive to light.  Neck: Neck supple.  Cardiovascular: Normal rate and regular rhythm.  Respiratory: Effort normal and breath sounds normal. No respiratory distress. She has no wheezes.  GI: Soft. Bowel sounds are normal. She exhibits distension. There is no tenderness. There is no rebound.  Musculoskeletal: She exhibits trace edema.  Neurological: She is alert and oriented to person, place, and time.  Decreased sensation 1/2 from lateral thigh to foot and back to buttocks. Can sense touch around buttocks (is sensing flatus also). 2-2+/5 at HF, Right KE 1+, L knee 2, right ankle 1, left ankle 2-. dtr's trace to absent  Skin: Skin is warm and dry.  moderate amount of sero-sanguinous drainage from back incision. Dressing also looks like urine stain Mild maceration of lower aspect Psychiatric: She has a normal mood and affect. Her behavior  is normal. Judgment and thought content normal.    Assessment/Plan: 1. Functional deficits secondary to cauda equina syndrome which require 3+ hours per day of interdisciplinary therapy in a comprehensive inpatient rehab setting. Physiatrist is providing close team supervision and 24 hour management of active medical problems listed below. Physiatrist and rehab team continue to assess barriers to discharge/monitor patient progress toward functional and medical goals. FIM: FIM - Bathing Bathing Steps Patient Completed: Chest;Right Arm;Left Arm;Abdomen;Right upper leg;Left upper leg;Right lower leg (including foot);Left lower leg (including foot) Bathing: 3: Mod-Patient completes 5-7 95f 10 parts or 50-74%  FIM - Upper Body Dressing/Undressing Upper body dressing/undressing steps patient completed: Thread/unthread right sleeve of pullover shirt/dresss;Thread/unthread left sleeve of pullover shirt/dress;Put head through opening of pull over shirt/dress;Pull shirt over trunk Upper body dressing/undressing: 5: Set-up assist to: Obtain clothing/put away FIM - Lower Body Dressing/Undressing Lower body dressing/undressing: 1: Total-Patient completed less than 25% of tasks  FIM - Toileting Toileting steps completed by patient: Performs perineal hygiene Toileting Assistive Devices: Grab bar or rail for support;Toilet Aid/prosthesis/orthosis Toileting: 1: Total-Patient completed zero steps, helper did all 3  FIM - Diplomatic Services operational officer Devices: Grab bars Toilet Transfers: 2-From toilet/BSC: Max A (lift and lower assist);2-To toilet/BSC: Max A (lift and lower assist)  FIM - Press photographer Assistive Devices: Arm rests;Bed rails Bed/Chair Transfer: 2: Chair or W/C > Bed: Max A (lift and lower assist);3: Sit > Supine: Mod A (lifting assist/Pt.  50-74%/lift 2 legs)  FIM - Locomotion: Wheelchair Distance: 60 Locomotion: Wheelchair: 2: Travels 50 - 149 ft  with supervision, cueing or coaxing FIM - Locomotion: Ambulation Locomotion: Ambulation Assistive Devices: Designer, industrial/product Ambulation/Gait Assistance: 1: +2 Total assist Locomotion: Ambulation: 1: Two helpers  Comprehension Comprehension Mode: Auditory Comprehension: 5-Understands complex 90% of the time/Cues < 10% of the time  Expression Expression Mode: Verbal Expression: 6-Expresses complex ideas: With extra time/assistive device  Social Interaction Social Interaction: 6-Interacts appropriately with others with medication or extra time (anti-anxiety, antidepressant).  Problem Solving Problem Solving: 5-Solves complex 90% of the time/cues < 10% of the time  Memory Memory: 7-Complete Independence: No helper  Cauda equina syndrome with B/B issues.  1. DVT Prophylaxis/Anticoagulation: Pharmaceutical: Lovenox  -dopplers negative 2. Chronic back pain/Pain Management: prn medcications effective.  3. H/o depression with anxiety/Mood: Provide ego support. LCSW to follow for evaluation and support with recent lifestyle changes.  4. Neuropsych: This patient is capable of making decisions on her own behalf.  5. HTN: On HCTZ and cozaar. Decreased cozaar to 25 and set parameters on BP meds to avoid orthostasis. 6. ABLA: hgb 9.5, continue iron supplement.  7. Neurogenic Bowel and bladder: bowel program. Suppository today and daily for now.  -typically moved bowels daily in the am at home   Reinsert foley to protect wound since freq inc 8. Wound: continue current dressing changes  -may shower if the wound is covered, water-tight (opsite)   LOS (Days) 3 A FACE TO FACE EVALUATION WAS PERFORMED  Toddrick Sanna E 09/11/2013 8:59 AM

## 2013-09-11 NOTE — Progress Notes (Signed)
Patient was given dulcolax suppository; thirty minutes later dig stim was performed and patient was placed on bedpan.  Patient is currently refusing to be transferred to the bedside commode; patient educated on the importance of mobility and gravity with promoting bowel movement.  No results after dig stim; will continue to monitor.

## 2013-09-12 ENCOUNTER — Inpatient Hospital Stay (HOSPITAL_COMMUNITY): Payer: BC Managed Care – PPO | Admitting: Physical Therapy

## 2013-09-12 ENCOUNTER — Inpatient Hospital Stay (HOSPITAL_COMMUNITY): Payer: BC Managed Care – PPO | Admitting: Occupational Therapy

## 2013-09-12 MED ORDER — CIPROFLOXACIN HCL 500 MG PO TABS
500.0000 mg | ORAL_TABLET | Freq: Two times a day (BID) | ORAL | Status: AC
  Administered 2013-09-12 – 2013-09-15 (×7): 500 mg via ORAL
  Filled 2013-09-12 (×7): qty 1

## 2013-09-12 MED ORDER — SENNOSIDES-DOCUSATE SODIUM 8.6-50 MG PO TABS
2.0000 | ORAL_TABLET | Freq: Two times a day (BID) | ORAL | Status: DC
  Administered 2013-09-12 – 2013-09-13 (×4): 2 via ORAL
  Filled 2013-09-12 (×4): qty 2

## 2013-09-12 NOTE — Progress Notes (Addendum)
Subjective/Complaints: Had a reasonable night. Pain controlled. Foley in to see if urine contributed to wet incision.  Constipated RN notes foul odor from incision A 12 point review of systems has been performed and if not noted above is otherwise negative.   Objective: Vital Signs: Blood pressure 116/82, pulse 71, temperature 98 F (36.7 C), temperature source Oral, resp. rate 18, height 5\' 8"  (1.727 m), weight 111.9 kg (246 lb 11.1 oz), SpO2 100.00%. No results found. No results found for this basename: WBC, HGB, HCT, PLT,  in the last 72 hours No results found for this basename: NA, K, CL, CO, GLUCOSE, BUN, CREATININE, CALCIUM,  in the last 72 hours CBG (last 3)  No results found for this basename: GLUCAP,  in the last 72 hours  Wt Readings from Last 3 Encounters:  09/08/13 111.9 kg (246 lb 11.1 oz)  09/07/13 112 kg (246 lb 14.6 oz)  09/07/13 112 kg (246 lb 14.6 oz)    Physical Exam:  Constitutional: She is oriented to person, place, and time. She appears well-developed and well-nourished.  HENT:  Head: Normocephalic and atraumatic.  Eyes: Conjunctivae are normal. Pupils are equal, round, and reactive to light.  Neck: Neck supple.  Cardiovascular: Normal rate and regular rhythm.  Respiratory: Effort normal and breath sounds normal. No respiratory distress. She has no wheezes.  GI: Soft. Bowel sounds are normal. She exhibits distension. There is no tenderness. There is no rebound.  Musculoskeletal: She exhibits trace edema.  Neurological: She is alert and oriented to person, place, and time.   (is sensing flatus also). 2-2+/5 at HF, Right KE 1+, L knee 2, right ankle 1, left ankle 2-. dtr's trace to absent  Skin: Skin is warm and dry.  moderate amount of sero-sanguinous drainage from back incision.  Mild maceration of lower aspect Psychiatric: She has a normal mood and affect. Her behavior is normal. Judgment and thought content normal.    Assessment/Plan: 1. Functional  deficits secondary to cauda equina syndrome which require 3+ hours per day of interdisciplinary therapy in a comprehensive inpatient rehab setting. Physiatrist is providing close team supervision and 24 hour management of active medical problems listed below. Physiatrist and rehab team continue to assess barriers to discharge/monitor patient progress toward functional and medical goals. FIM: FIM - Bathing Bathing Steps Patient Completed: Chest;Right Arm;Left Arm;Abdomen;Right upper leg;Left upper leg;Right lower leg (including foot);Left lower leg (including foot) Bathing: 3: Mod-Patient completes 5-7 95f 10 parts or 50-74%  FIM - Upper Body Dressing/Undressing Upper body dressing/undressing steps patient completed: Thread/unthread right sleeve of pullover shirt/dresss;Thread/unthread left sleeve of pullover shirt/dress;Put head through opening of pull over shirt/dress;Pull shirt over trunk Upper body dressing/undressing: 5: Set-up assist to: Obtain clothing/put away FIM - Lower Body Dressing/Undressing Lower body dressing/undressing: 1: Total-Patient completed less than 25% of tasks  FIM - Toileting Toileting steps completed by patient: Performs perineal hygiene Toileting Assistive Devices: Grab bar or rail for support;Toilet Aid/prosthesis/orthosis Toileting: 1: Total-Patient completed zero steps, helper did all 3  FIM - Diplomatic Services operational officer Devices: Grab bars Toilet Transfers: 2-From toilet/BSC: Max A (lift and lower assist);2-To toilet/BSC: Max A (lift and lower assist)  FIM - Press photographer Assistive Devices: Arm rests;Bed rails Bed/Chair Transfer: 2: Chair or W/C > Bed: Max A (lift and lower assist);3: Sit > Supine: Mod A (lifting assist/Pt. 50-74%/lift 2 legs)  FIM - Locomotion: Wheelchair Distance: 60 Locomotion: Wheelchair: 2: Travels 50 - 149 ft with supervision, cueing or coaxing FIM -  Locomotion: Ambulation Locomotion: Ambulation  Assistive Devices: Designer, industrial/product Ambulation/Gait Assistance: 1: +2 Total assist Locomotion: Ambulation: 1: Two helpers  Comprehension Comprehension Mode: Auditory Comprehension: 5-Follows basic conversation/direction: With no assist  Expression Expression Mode: Verbal Expression: 6-Expresses complex ideas: With extra time/assistive device  Social Interaction Social Interaction: 6-Interacts appropriately with others with medication or extra time (anti-anxiety, antidepressant).  Problem Solving Problem Solving: 5-Solves basic problems: With no assist  Memory Memory: 7-Complete Independence: No helper  Cauda equina syndrome with B/B issues.  1. DVT Prophylaxis/Anticoagulation: Pharmaceutical: Lovenox  -dopplers negative 2. Chronic back pain/Pain Management: prn medcications effective.  3. H/o depression with anxiety/Mood: Provide ego support. LCSW to follow for evaluation and support with recent lifestyle changes.  4. Neuropsych: This patient is capable of making decisions on her own behalf.  5. HTN: On HCTZ and cozaar. Decreased cozaar to 25 and set parameters on BP meds to avoid orthostasis. 6. ABLA: hgb 9.5, continue iron supplement.  7. Neurogenic Bowel and bladder: bowel program. Suppository today and daily for now.  -typmaximize senna/colace   Reinsert foley to protect wound since freq inc, does not appear to be contributing, will D/C foley in am for voiding trial, start urecholine 8. Wound: increase current dressing changes to qid, increase cipro to 500mg  BID to allow skin coverage  -may shower if the wound is covered, water-tight (opsite)   LOS (Days) 4 A FACE TO FACE EVALUATION WAS PERFORMED  Markale Birdsell E 09/12/2013 9:03 AM

## 2013-09-12 NOTE — Progress Notes (Signed)
Occupational Therapy Session Notes  Patient Details  Name: Sherry Wong MRN: [id number] Date of Birth: 06-28-1956  Today's Date: 09/12/2013 Time: 0900-1000 and 100-155 Time Calculation (min): 60 min and 55 min  Short Term Goals: Week 1:  OT Short Term Goal 1 (Week 1): Patient will complete upper body bathing and dressing with supervision to maintain back precautions and setup assistance OT Short Term Goal 2 (Week 1): Patient will complete transfer bed <> w/c with mod assist OT Short Term Goal 3 (Week 1): Patient will demonstrate ability to use toilet aid for toilet hygiene with min assist for thoroughness OT Short Term Goal 4 (Week 1): Patient will complete transfer from w/c to standard tub and tub bench with max assist to manage LE OT Short Term Goal 5 (Week 1): Patient will maintain supported static standing balance during ADL with steadying assist OT Short Term Goal 5 - Progress (Week 1): Met  Skilled Therapeutic Interventions/Progress Updates:  1)  Patient resting in bed upon arrival.  Engaged in self care retraining to include sponge bath, dress, safe bed>w/c transfers and groom at sink. Focused session on adhering to back precautions (she could recall 3/3 however had difficulty adhering to them), use of AE, sitting tolerance & balance, and lateral leans for pulling up pants.  Patient able to wash her perineal area in bed with HOB raised, when rolling in bed patient with incontinent BM which she was unaware of.  Performed remainder of sponge bath and all of her dressing seated EOB.  Patient in w/c upon completion with PT on the way.  2)  Patient up in w/c up arrival.  Engaged in w/c><commode transfers, toileting, w/c>bed.  Focused session on adhering to back precautions, safe transfers, lateral leans to for pants down and up, activity tolerance and attempted use of bottom wiper tongs however patient with large soft incontinent BM in brief prior to toilet transfer in addition to BM in  toilet.  Due to the large cleaup, patient required total assist then total assist +2 due to fatigue, back pain and dizziness while on the toilet.  RN came in to assist patient with partial clean-up/pants up>w/c>bed for thorough clean up and rest.    Therapy Documentation Precautions:  Precautions Precautions: Back;Fall Precaution Booklet Issued: Yes (comment) Precaution Comments: Pt able to recall 2 of 3 Restrictions Weight Bearing Restrictions: No Pain: 1)  2/10 in lower back premedicated,  2) Lower back pain, not rated, RN aware and will bring medication, rest and repositioned. ADL: See FIM for current functional status  Therapy/Group: Individual Therapy both session  Aubri Gathright 09/12/2013, 9:57 AM

## 2013-09-12 NOTE — Progress Notes (Signed)
Physical Therapy Note  Patient Details  Name: Sherry Wong MRN: [id number] Date of Birth: [birth year] Today's Date: 09/12/2013  1000-1055 (55 minutes) individual Pain: no complaint of pain Precautions: BACK Focus of treatment: therapeutic exercise focused on bilateral LE strengthening; sit to stand/standing tolerance with AD Treatment: Pt up in wc upon arrival; wc mobility 75 feet X 2 SBA with one rest break ; transfer scoot level wc >< mat mod assist with vcs for removal of legrests; sit to sidelying to supine mod assist bilateral LEs; supine to side to sit (mat) SBA; therapeutic exercises X 20 bilaterally heel slides, hip abduction, manual resisted leg presses; passive stretches bilateral heel cords; sit to stand X 4  To EVA walker mod assist ; pt able to tolerate standing from 25 seconds to 45 seconds before c/o bilateral knee fatigue; returned to room with quick release belt in place.    1430 Pt missed 30 minute PT session secondary to breathing difficulties.  Anays Detore,JIM 09/12/2013, 10:09 AM

## 2013-09-13 ENCOUNTER — Encounter (HOSPITAL_COMMUNITY): Payer: BC Managed Care – PPO

## 2013-09-13 ENCOUNTER — Inpatient Hospital Stay (HOSPITAL_COMMUNITY): Payer: BC Managed Care – PPO | Admitting: Physical Therapy

## 2013-09-13 ENCOUNTER — Inpatient Hospital Stay (HOSPITAL_COMMUNITY): Payer: BC Managed Care – PPO

## 2013-09-13 DIAGNOSIS — G834 Cauda equina syndrome: Secondary | ICD-10-CM

## 2013-09-13 MED ORDER — TAMSULOSIN HCL 0.4 MG PO CAPS
0.4000 mg | ORAL_CAPSULE | Freq: Every day | ORAL | Status: DC
  Administered 2013-09-13 – 2013-09-28 (×16): 0.4 mg via ORAL
  Filled 2013-09-13 (×17): qty 1

## 2013-09-13 MED ORDER — BETHANECHOL CHLORIDE 25 MG PO TABS
25.0000 mg | ORAL_TABLET | Freq: Three times a day (TID) | ORAL | Status: DC
  Administered 2013-09-13 (×3): 25 mg via ORAL
  Filled 2013-09-13 (×7): qty 1

## 2013-09-13 NOTE — Progress Notes (Signed)
Subjective/Complaints: Had a reasonable night. Pain controlled. Foley in to see if urine contributed to wet incision, which it did not RN notes no odor from incision and reduced drainage Asking about suture removal A 12 point review of systems has been performed and if not noted above is otherwise negative.   Objective: Vital Signs: Blood pressure 103/66, pulse 68, temperature 97.5 F (36.4 C), temperature source Oral, resp. rate 18, height 5\' 8"  (1.727 m), weight 111.9 kg (246 lb 11.1 oz), SpO2 100.00%. No results found. No results found for this basename: WBC, HGB, HCT, PLT,  in the last 72 hours No results found for this basename: NA, K, CL, CO, GLUCOSE, BUN, CREATININE, CALCIUM,  in the last 72 hours CBG (last 3)  No results found for this basename: GLUCAP,  in the last 72 hours  Wt Readings from Last 3 Encounters:  09/08/13 111.9 kg (246 lb 11.1 oz)  09/07/13 112 kg (246 lb 14.6 oz)  09/07/13 112 kg (246 lb 14.6 oz)    Physical Exam:  Constitutional: She is oriented to person, place, and time. She appears well-developed and well-nourished.  HENT:  Head: Normocephalic and atraumatic.  Eyes: Conjunctivae are normal. Pupils are equal, round, and reactive to light.  Neck: Neck supple.  Cardiovascular: Normal rate and regular rhythm.  Respiratory: Effort normal and breath sounds normal. No respiratory distress. She has no wheezes.  GI: Soft. Bowel sounds are normal. She exhibits distension. There is no tenderness. There is no rebound.  Musculoskeletal: She exhibits trace edema.  Neurological: She is alert and oriented to person, place, and time.   (is sensing flatus also). 2-2+/5 at HF, Right KE 1+, L knee 2, right ankle 1, left ankle 2-. dtr's trace to absent  Skin: Skin is warm and dry.  moderate amount of sero-sanguinous drainage from back incision.  Mild maceration of lower aspect Psychiatric: She has a normal mood and affect. Her behavior is normal. Judgment and thought  content normal.    Assessment/Plan: 1. Functional deficits secondary to cauda equina syndrome which require 3+ hours per day of interdisciplinary therapy in a comprehensive inpatient rehab setting. Physiatrist is providing close team supervision and 24 hour management of active medical problems listed below. Physiatrist and rehab team continue to assess barriers to discharge/monitor patient progress toward functional and medical goals. FIM: FIM - Bathing Bathing Steps Patient Completed: Chest;Right Arm;Left Arm;Abdomen;Right upper leg;Left upper leg;Right lower leg (including foot);Left lower leg (including foot);Front perineal area (front perineal area with HOB up) Bathing: 3: Mod-Patient completes 5-7 39f 10 parts or 50-74%  FIM - Upper Body Dressing/Undressing Upper body dressing/undressing steps patient completed: Thread/unthread right sleeve of pullover shirt/dresss;Thread/unthread left sleeve of pullover shirt/dress;Put head through opening of pull over shirt/dress;Pull shirt over trunk Upper body dressing/undressing: 5: Set-up assist to: Obtain clothing/put away FIM - Lower Body Dressing/Undressing Lower body dressing/undressing steps patient completed: Thread/unthread right pants leg;Thread/unthread left pants leg;Don/Doff left shoe (lateral leans EOB for pants up) Lower body dressing/undressing: 2: Max-Patient completed 25-49% of tasks  FIM - Toileting Toileting steps completed by patient: Performs perineal hygiene Toileting Assistive Devices: Grab bar or rail for support;Toilet Aid/prosthesis/orthosis Toileting: 1: Two helpers  FIM - Diplomatic Services operational officer Devices: Therapist, music Transfers: 2-To toilet/BSC: Max A (lift and lower assist);1-From toilet/BSC: Total A (helper does all/Pt. < 25%);1-Two helpers  FIM - Banker Devices: Arm rests Bed/Chair Transfer: 1: Chair or W/C > Bed: Total A (helper does all/Pt. <  25%);1: Two helpers (fatigue and light headed after toileting)  FIM - Locomotion: Wheelchair Distance: 60 Locomotion: Wheelchair: 2: Travels 50 - 149 ft with supervision, cueing or coaxing FIM - Locomotion: Ambulation Locomotion: Ambulation Assistive Devices: Designer, industrial/product Ambulation/Gait Assistance: 1: +2 Total assist Locomotion: Ambulation: 1: Two helpers  Comprehension Comprehension Mode: Auditory Comprehension: 6-Follows complex conversation/direction: With extra time/assistive device  Expression Expression Mode: Verbal Expression: 6-Expresses complex ideas: With extra time/assistive device  Social Interaction Social Interaction: 6-Interacts appropriately with others with medication or extra time (anti-anxiety, antidepressant).  Problem Solving Problem Solving: 6-Solves complex problems: With extra time  Memory Memory: 7-Complete Independence: No helper  Cauda equina syndrome with B/B issues.  1. DVT Prophylaxis/Anticoagulation: Pharmaceutical: Lovenox  -dopplers negative 2. Chronic back pain/Pain Management: prn medcications effective.  3. H/o depression with anxiety/Mood: Provide ego support. LCSW to follow for evaluation and support with recent lifestyle changes.  4. Neuropsych: This patient is capable of making decisions on her own behalf.  5. HTN: On HCTZ and cozaar. Decreased cozaar to 25 and set parameters on BP meds to avoid orthostasis. 6. ABLA: hgb 9.5, continue iron supplement.  7. Neurogenic Bowel and bladder: bowel program. Suppository today and daily for now.  -typmaximize senna/colace   Reinsert foley to protect wound since freq inc, does not appear to be contributing, will D/C foley for voiding trial, start urecholine 8. Wound: increase current dressing changes to qid, increase cipro to 500mg  BID to allow skin coverage  -may shower if the wound is covered, water-tight (opsite)   LOS (Days) 5 A FACE TO FACE EVALUATION WAS PERFORMED  Erick Colace 09/13/2013 6:35 AM

## 2013-09-13 NOTE — Progress Notes (Signed)
Occupational Therapy Session Note  Patient Details  Name: Sherry Wong MRN: [id number] Date of Birth: 1956/08/16  Today's Date: 09/13/2013 Time: [phone] 60 Min  Short Term Goals: Week 1:  OT Short Term Goal 1 (Week 1): Patient will complete upper body bathing and dressing with supervision to maintain back precautions and setup assistance OT Short Term Goal 2 (Week 1): Patient will complete transfer bed <> w/c with mod assist OT Short Term Goal 3 (Week 1): Patient will demonstrate ability to use toilet aid for toilet hygiene with min assist for thoroughness OT Short Term Goal 4 (Week 1): Patient will complete transfer from w/c to standard tub and tub bench with max assist to manage LE OT Short Term Goal 5 (Week 1): Patient will maintain supported static standing balance during ADL with steadying assist   Skilled Therapeutic Interventions: ADL-retraining with emphasis on energy conservation, dynamic sitting balance, effective use of DME (Sara Steady lift), and adherence to back precautions.   Patient reported with low BP following physical therapy session and requested seated bathing and increased assist with transfer back to bed due to symptoms of light-headedness and fatigue.   Patient required extra time for seated bath, resting as needed, but progressed through upper body bathing and dressing without assist required.   At w/c level, patient donned pants using reacher and completed sit>stand using [place] Steady lift with min assist to lift off from w/c.  While standing in [place] lift patient pulled her pants up from knee level with max assist (pt = 40%).   Patient was returned to bed following dressing; RN notified of patient's success and reduced level assist for transfers with use of Sara Steady lift.  Therapy Documentation Precautions:  Precautions Precautions: Back;Fall Precaution Booklet Issued: Yes (comment) Precaution Comments: Pt able to recall 2 of 3 Restrictions Weight Bearing  Restrictions: No  Pain: No pain    ADL: ADL ADL Comments: see FIM  See FIM for current functional status  Therapy/Group: Individual Therapy  Second session: Time: 1300-1400 Time Calculation (min):  60 min  Pain Assessment: 6/10  Skilled Therapeutic Interventions: ADL-retraining (toilet transfer, toileting) 30 min, with emphasis on improved use of toilet aid (tongs) and DME (BSC and Sara lift).   Patient completed toilet transfer using [place] lift with only min assist  To transfer and max assist to manage diaper and clothing.   Patient completed toilet hygiene using tongs with good thoroughness although requiring min assist to complete task (pt = 80%).   Patient completed sit>stand using [place] Dec lift 5 times for change of diaper and clothing management with 1 rest break.   From bathroom, patient was escorted to gym for 30 min of therapeutic activity to perform w/c mobility and UE ergometry using Sci-Fit.    Patient tolerated Sci-Fit at level 2.0 X 10 min, reporting exertion as 11 on BORG scale.   Patient was then returned to her room to recover, call light and phone placed within reach.  See FIM for current functional status  Therapy/Group: Individual Therapy  Dewey Neukam 09/13/2013, 3:50 PM

## 2013-09-13 NOTE — Plan of Care (Signed)
Problem: SCI BOWEL ELIMINATION Goal: RH STG MANAGE BOWEL WITH ASSISTANCE STG Manage Bowel with min Assistance.  Foley cath

## 2013-09-13 NOTE — Progress Notes (Addendum)
Physical Therapy Session Note  Patient Details  Name: Sherry Wong MRN: [id number] Date of Birth: Feb 18, 1956  Today's Date: 09/13/2013 Time: 0830-0930 Time Calculation (min): 60 min  Short Term Goals: Week 1:  PT Short Term Goal 1 (Week 1): Scoot pivot transfers with mod assist  PT Short Term Goal 2 (Week 1): wheelchair propulsion x 150' with supervision PT Short Term Goal 3 (Week 1): Sit <> stands with max assist.   Skilled Therapeutic Interventions/Progress Updates:    Supine bil. LE therex: light manual resistance of hip/knee flexion/extension & hip abduction/adduction and ankle plantarflexion; PROM ankle dorsiflexion with facilitation of anterior tibialis tendon. Scoot transfer to drop arm BSC, pt losing stool along the way.   Sit <> stands x 5 reps and standing balance with RW support (~36min each bout) while cleaning from BM. Pt lightheaded with each stand, (BP ~98/62 increased to 109/71 with activity). Pt requires mod/max to sit > stand demonstrating improvement from Friday.   Pain: 4-5/10 low back. Repositioned, premedicated See FIM for current functional status  Second Session Time:  1400-1430  Time Calculation (min): 30 min Skilled Therapeutic Interventions/Progress Updates:  Session focused on scoot pivot transfers, lateral scooting along mat to improve bil. LE strength and contribution to transfers. Sit <> stands (mod/max assist) in parallel bars with sheet to assist with full hip extension. Mod verbal cues for precautions. Extended rest breaks needed. Pain 6/10 low back, repositioned and rest as needed.  Therapy Documentation Precautions:  Precautions Precautions: Back;Fall Precaution Booklet Issued: Yes (comment) Precaution Comments: Pt able to recall 2 of 3 Restrictions Weight Bearing Restrictions: No   Therapy/Group: Individual Therapy  Wilhemina Bonito 09/13/2013, 10:47 AM

## 2013-09-13 NOTE — Progress Notes (Signed)
Patient has a moderate amount of serosanguinous drainage from incision.  Three yellow areas noted along incision.  Marissa Nestle, PA notified.  No new orders received.  Will continue to monitor.

## 2013-09-13 NOTE — Plan of Care (Signed)
Problem: SCI BOWEL ELIMINATION Goal: RH STG MANAGE BOWEL WITH ASSISTANCE STG Manage Bowel with min Assistance.  Bowel program q 0600 Goal: RH STG SCI MANAGE BOWEL WITH MEDICATION WITH ASSISTANCE STG SCI Manage bowel with medication with min assistance.  Outcome: Progressing Bowel program q 0600  Problem: SCI BLADDER ELIMINATION Goal: RH STG MANAGE BLADDER WITH ASSISTANCE STG Manage Bladder With min Assistance  foley

## 2013-09-13 NOTE — Progress Notes (Signed)
Back dressing changed this morning @ 0630. Moderate amount of serosanguinous noted to dressing. Will continue to monitor. Sherry Wong

## 2013-09-14 ENCOUNTER — Inpatient Hospital Stay (HOSPITAL_COMMUNITY): Payer: BC Managed Care – PPO | Admitting: Occupational Therapy

## 2013-09-14 ENCOUNTER — Inpatient Hospital Stay (HOSPITAL_COMMUNITY): Payer: BC Managed Care – PPO

## 2013-09-14 ENCOUNTER — Inpatient Hospital Stay (HOSPITAL_COMMUNITY): Payer: BC Managed Care – PPO | Admitting: Physical Therapy

## 2013-09-14 MED ORDER — BETHANECHOL CHLORIDE 10 MG PO TABS
10.0000 mg | ORAL_TABLET | Freq: Three times a day (TID) | ORAL | Status: DC
  Administered 2013-09-14 (×3): 10 mg via ORAL
  Filled 2013-09-14 (×7): qty 1

## 2013-09-14 MED ORDER — SENNOSIDES-DOCUSATE SODIUM 8.6-50 MG PO TABS
2.0000 | ORAL_TABLET | Freq: Every day | ORAL | Status: DC
  Administered 2013-09-14 – 2013-09-28 (×14): 2 via ORAL
  Filled 2013-09-14 (×14): qty 2

## 2013-09-14 NOTE — Progress Notes (Signed)
Subjective/Complaints: Voiding/moving bowels yesterday. No new complaints. Concerned about low bp's yesterday.  A 12 point review of systems has been performed and if not noted above is otherwise negative.   Objective: Vital Signs: Blood pressure 119/77, pulse 84, temperature 98.3 F (36.8 C), temperature source Oral, resp. rate 19, height 5\' 8"  (1.727 m), weight 111.9 kg (246 lb 11.1 oz), SpO2 100.00%. No results found. No results found for this basename: WBC, HGB, HCT, PLT,  in the last 72 hours No results found for this basename: NA, K, CL, CO, GLUCOSE, BUN, CREATININE, CALCIUM,  in the last 72 hours CBG (last 3)  No results found for this basename: GLUCAP,  in the last 72 hours  Wt Readings from Last 3 Encounters:  09/08/13 111.9 kg (246 lb 11.1 oz)  09/07/13 112 kg (246 lb 14.6 oz)  09/07/13 112 kg (246 lb 14.6 oz)    Physical Exam:  Constitutional: She is oriented to person, place, and time. She appears well-developed and well-nourished.  HENT:  Head: Normocephalic and atraumatic.  Eyes: Conjunctivae are normal. Pupils are equal, round, and reactive to light.  Neck: Neck supple.  Cardiovascular: Normal rate and regular rhythm.  Respiratory: Effort normal and breath sounds normal. No respiratory distress. She has no wheezes.  GI: Soft. Bowel sounds are normal. She exhibits distension. There is no tenderness. There is no rebound.  Musculoskeletal: She exhibits trace edema.  Neurological: She is alert and oriented to person, place, and time.    2-2+/5 at HF, Right KE 1+, L knee 2, right ankle 1, left ankle 2-. dtr's trace to absent  Skin: Skin is warm and dry.  Mild to moderate amount of sero-sanguinous drainage from back incision.  Mild maceration of lower aspect Psychiatric: She has a normal mood and affect. Her behavior is normal. Judgment and thought content normal.    Assessment/Plan: 1. Functional deficits secondary to cauda equina syndrome which require 3+ hours  per day of interdisciplinary therapy in a comprehensive inpatient rehab setting. Physiatrist is providing close team supervision and 24 hour management of active medical problems listed below. Physiatrist and rehab team continue to assess barriers to discharge/monitor patient progress toward functional and medical goals. FIM: FIM - Bathing Bathing Steps Patient Completed: Chest;Right Arm;Left Arm;Abdomen;Front perineal area;Right upper leg;Left upper leg Bathing: 3: Mod-Patient completes 5-7 6f 10 parts or 50-74%  FIM - Upper Body Dressing/Undressing Upper body dressing/undressing steps patient completed: Thread/unthread right sleeve of pullover shirt/dresss;Thread/unthread left sleeve of pullover shirt/dress;Put head through opening of pull over shirt/dress;Pull shirt over trunk Upper body dressing/undressing: 7: Complete Independence: No helper FIM - Lower Body Dressing/Undressing Lower body dressing/undressing steps patient completed: Thread/unthread right pants leg;Thread/unthread left pants leg Lower body dressing/undressing: 2: Max-Patient completed 25-49% of tasks  FIM - Toileting Toileting steps completed by patient: Adjust clothing prior to toileting;Adjust clothing after toileting Toileting Assistive Devices: Grab bar or rail for support Toileting: 3: Mod-Patient completed 2 of 3 steps  FIM - Diplomatic Services operational officer Devices: Psychiatrist Transfers: 1-Mechanical lift Huntley Dec steady)  FIM - Banker Devices: Arm rests Bed/Chair Transfer: 4: Supine > Sit: Min A (steadying Pt. > 75%/lift 1 leg);3: Bed > Chair or W/C: Mod A (lift or lower assist)  FIM - Locomotion: Wheelchair Distance: 60 Locomotion: Wheelchair: 2: Travels 50 - 149 ft with supervision, cueing or coaxing FIM - Locomotion: Ambulation Locomotion: Ambulation Assistive Devices: Designer, industrial/product Ambulation/Gait Assistance: 1: +2 Total  assist Locomotion: Ambulation:  1: Two helpers  Comprehension Comprehension Mode: Auditory Comprehension: 6-Follows complex conversation/direction: With extra time/assistive device  Expression Expression Mode: Verbal Expression: 6-Expresses complex ideas: With extra time/assistive device  Social Interaction Social Interaction: 6-Interacts appropriately with others with medication or extra time (anti-anxiety, antidepressant).  Problem Solving Problem Solving: 6-Solves complex problems: With extra time  Memory Memory: 7-Complete Independence: No helper  Cauda equina syndrome with B/B issues.  1. DVT Prophylaxis/Anticoagulation: Pharmaceutical: Lovenox  -dopplers negative 2. Chronic back pain/Pain Management: prn medcications effective.  3. H/o depression with anxiety/Mood: Provide ego support. LCSW to follow for evaluation and support with recent lifestyle changes.  4. Neuropsych: This patient is capable of making decisions on her own behalf.  5. HTN: On HCTZ and cozaar. Decreased cozaar to 25 and set parameters on BP meds to avoid orthostasis.  -hold hctz  -TEDS, adequate fluids. ?binder if needed 6. ABLA: hgb 9.5, continue iron supplement.  7. Neurogenic Bowel and bladder: improving function -bowel meds--back off somewhat -urecholine for bladder--decrease   8. Wound: increased  dressing changes to qid, increased cipro to 500mg  BID to allow skin coverage--continue for now  -may shower if the wound is covered, water-tight (opsite)   LOS (Days) 6 A FACE TO FACE EVALUATION WAS PERFORMED  SWARTZ,ZACHARY T 09/14/2013 7:56 AM

## 2013-09-14 NOTE — Progress Notes (Signed)
Occupational Therapy Session Note  Patient Details  Name: Sherry Wong MRN: [id number] Date of Birth: January 04, 1956  Today's Date: 09/14/2013 Time: 7829-5621 Time Calculation (min): 29 min  Skilled Therapeutic Interventions/Progress Updates:    Pt worked on sit to stand intervals from wheelchair level this session.  Pt needs max facilitation to achieve standing with mod instructional cueing to push up from the wheelchair arm with at least one UE, while having the second positioned on the walker.  Pt able to stay standing with min assist using the RW for bilateral support for intervals of 1 minute to 1 minute and 10 seconds for 5 intervals.  Pt currently unable to release and UE from the walker and maintain balance at this time.  Transferred back to bed at the end of the session with mod assist scoot pivot.    Therapy Documentation Precautions:  Precautions Precautions: Back;Fall Precaution Booklet Issued: Yes (comment) Precaution Comments: Pt able to recall 2 of 3 Restrictions Weight Bearing Restrictions: No  Pain: Pain Assessment Pain Assessment: No/denies pain  See FIM for current functional status  Therapy/Group: Individual Therapy  Sheyenne Konz OTR/L 09/14/2013, 4:22 PM

## 2013-09-14 NOTE — Progress Notes (Signed)
Physical Therapy Session Note  Patient Details  Name: Sherry Wong MRN: [id number] Date of Birth: [birth year]  Today's Date: 09/14/2013 Time:0730-0830 Total Time: 60 min  Short Term Goals: Week 1:  PT Short Term Goal 1 (Week 1): Scoot pivot transfers with mod assist  PT Short Term Goal 2 (Week 1): wheelchair propulsion x 150' with supervision PT Short Term Goal 3 (Week 1): Sit <> stands with max assist.   Skilled Therapeutic Interventions/Progress Updates:    Supervision for bed mobilty, good demonstration of back precautions - verbalizes 3/3 but requires mod cues with mobility. Attempted 3 stand pivot transfers usuccessful due to inabiltiy to step in standing. Ambulation with bil. Dorsiflexion assist from ACE wraps- 4 steps, 4 steps, then 7 steps. BP: 113/81, 116/78, 125/87 after discussion pt appears to be more limited by fear of falling/anxiety, able to nearly double ambulation distance when PT thoroughly explained measures taken to prevent fall. Bowel movement at end of session in brief, left with nursing to be cleaned.   Therapy Documentation Precautions:  Precautions Precautions: Back;Fall Precaution Booklet Issued: Yes (comment) Precaution Comments: Pt able to recall 2 of 3 Restrictions Weight Bearing Restrictions: No Pain:  4/10 low back with activity, premedicated  See FIM for current functional status  Therapy/Group: Individual Therapy  Wilhemina Bonito 09/14/2013, 3:57 PM

## 2013-09-14 NOTE — Progress Notes (Signed)
Bowel program @ 0600. Thirty min. later digstem was performed and patient placed on BSC. No stool felt in rectal vault with digstem. Patient had two bowel movements previous evenings prior to HS. Small amount of stool produced this am.

## 2013-09-14 NOTE — Progress Notes (Signed)
Occupational Therapy Session Note  Patient Details  Name: Sherry Wong MRN: [id number] Date of Birth: 27-May-1956  Today's Date: 09/14/2013 Time: 0930-1030  Time Calculation (min): 60 min  Short Term Goals: Week 1:  OT Short Term Goal 1 (Week 1): Patient will complete upper body bathing and dressing with supervision to maintain back precautions and setup assistance OT Short Term Goal 2 (Week 1): Patient will complete transfer bed <> w/c with mod assist OT Short Term Goal 3 (Week 1): Patient will demonstrate ability to use toilet aid for toilet hygiene with min assist for thoroughness OT Short Term Goal 4 (Week 1): Patient will complete transfer from w/c to standard tub and tub bench with max assist to manage LE OT Short Term Goal 5 (Week 1): Patient will maintain supported static standing balance during ADL with steadying assist  Skilled Therapeutic Interventions: ADL-retraining with emphasis on performance of lower body bathing and dressing using AE, endurance, supported static standing balance, and adherence to back precautions.   Patient completed bathing seated at sink with setup assist and use of LH sponge for lower body bathing, reacher for dressing. Patient completed bathing but was unable to rise from sitting to standing at sink with max assist X 1 to allow assist with lower body dressing (pulling up pants).  Patient required use of Huntley Dec [facility]) lift to finish dressing, returning to w/c to complete grooming at sink side.  Following ADL patient requested return to bed due to fatigue with need for continued RN tech support due to report of BM resulting from exertion during static standing at [facility].   Therapy Documentation Precautions:  Precautions Precautions: Back;Fall Precaution Booklet Issued: Yes (comment) Precaution Comments: Pt able to recall 2 of 3 Restrictions Weight Bearing Restrictions: No  Vital Signs: Therapy Vitals Temp: 97.7 F (36.5 C) Temp src: Oral Pulse  Rate: 87 Resp: 18 BP: 99/68 mmHg Patient Position, if appropriate: Sitting Oxygen Therapy SpO2: 97 % O2 Device: None (Room air)  Pain: 4/10, low back, distraction   See FIM for current functional status  Therapy/Group: Individual Therapy  Second session: Time: 1300-1400 Time Calculation (min):  60 min  Pain Assessment: 4/10  Skilled Therapeutic Interventions: ADL-retraining with emphasis on improved sit>stand from bed to RW, toileting, and lower body dressing using AE.   Patient able to complete bed mobility unassisted using bed rail to roll to her right and rise from supine to sitting at edge of bed.   Patient completed 1 sit>stand - stand>sit with only mod assist (lifting) but failed on second attempt after rest break and reported onset BM with need for toileting d/t exertion.   Patient transferred to toilet using [facility]) lift but she completed clothing management (doffing pants) and removed diaper (50%) while standing supported on SS.  Patient lowered herself to [facility] and completed toileting using tongs for hygiene unassisted with good thoroughness this session.   Patient required additional rest break after toileting and max assist to don diaper and pants using SS.    Patient then completed squat pivot transfer from w/c to NuStep in gym to perform therex X 10 minutes using NuStep, legs only, 506 steps completed at level 2, exertion rated at 11 per BORG.   Patient returned to her room, remaining seated in w/c, call light placed within reach.  See FIM for current functional status  Therapy/Group: Individual Therapy  Zimri Brennen 09/14/2013, 3:10 PM

## 2013-09-14 NOTE — Progress Notes (Signed)
Orthopedic Tech Progress Note Patient Details:  Sherry Wong 1956/08/30 [id number] Called [facility] for brace order. Patient ID: Sherry Wong, female   DOB: 08/12/1956, 57 y.o.   MRN: [id number]   [name] 09/14/2013, 3:25 PM

## 2013-09-15 ENCOUNTER — Inpatient Hospital Stay (HOSPITAL_COMMUNITY): Payer: BC Managed Care – PPO | Admitting: Physical Therapy

## 2013-09-15 ENCOUNTER — Inpatient Hospital Stay (HOSPITAL_COMMUNITY): Payer: BC Managed Care – PPO

## 2013-09-15 DIAGNOSIS — S343XXA Injury of cauda equina, initial encounter: Secondary | ICD-10-CM

## 2013-09-15 DIAGNOSIS — K592 Neurogenic bowel, not elsewhere classified: Secondary | ICD-10-CM

## 2013-09-15 DIAGNOSIS — N319 Neuromuscular dysfunction of bladder, unspecified: Secondary | ICD-10-CM

## 2013-09-15 LAB — CREATININE, SERUM
Creatinine, Ser: 0.61 mg/dL (ref 0.50–1.10)
GFR calc Af Amer: >90 mL/min (ref >90)
GFR calc non Af Amer: >90 mL/min (ref >90)

## 2013-09-15 NOTE — Progress Notes (Signed)
Social Work Assessment and Plan  Patient Details  Name: Sherry Wong MRN: [id number] Date of Birth: 1956/09/11  Today's Date: 09/15/2013  Problem List:  Patient Active Problem List   Diagnosis Date Noted  . Cauda equina syndrome 09/08/2013  . Morbid obesity 08/26/2013  . HTN (hypertension) 08/26/2013  . Hypoxemia 08/26/2013  . Unspecified hypothyroidism 08/26/2013  . Lumbar degenerative disc disease 08/25/2013   Past Medical History:  Past Medical History  Diagnosis Date  . PONV (postoperative nausea and vomiting)   . Hypertension     takes Losartan daily  . Cough   . Sinus drainage   . Pneumonia     hx of 15+yrs ago  . Chronic back pain     ddd/lumbago,and radiculopathy  . Arthritis   . Gout     hx of  . GERD (gastroesophageal reflux disease)     takes Protonix daily  . H/O hiatal hernia   . Gastric ulcer   . Ulcerative colitis   . IBS (irritable bowel syndrome)   . Constipation   . Diarrhea   . Hx of colonic polyps   . Urinary incontinence   . Nocturia   . Hypothyroidism     takes Levothyroxine daily  . Depression   . Anxiety     takes celexa daily   Past Surgical History:  Past Surgical History  Procedure Laterality Date  . Cysts removed from ovary    . Cholecystectomy    . Dilation and curettage of uterus    . Breast surgery      left breast lumpectomy  . Breast biopsy      right  . Colonoscopy    . Esophagogastroduodenoscopy    . Lumbar laminectomy/decompression microdiscectomy  12/03/2011    Procedure: LUMBAR LAMINECTOMY/DECOMPRESSION MICRODISCECTOMY 2 LEVELS;  Surgeon: Karn Cassis, MD;  Location: [facility];  Service: Neurosurgery;  Laterality: Right;  Right Lumbar four-five Lumbar five sacral one Foraminotomies  . Transforaminal lumbar interbody fusion (tlif) with pedicle screw fixation 2 level N/A 08/25/2013    Procedure:  Lumbar four-five, Lumbar five-Sacral one Transforaminal Lumbar Interbody Fusion ;  Surgeon: Karn Cassis, MD;   Location: [facility];  Service: Neurosurgery;  Laterality: N/A;  . Hematoma evacuation N/A 08/25/2013    Procedure: EVACUATION OF LUMBAR HEMATOMA;  Surgeon: Karn Cassis, MD;  Location: [facility];  Service: Neurosurgery;  Laterality: N/A;  . Placement of lumbar drain N/A 09/01/2013    Procedure: PLACEMENT OF LUMBAR DRAIN;  Surgeon: Karn Cassis, MD;  Location: [facility];  Service: Neurosurgery;  Laterality: N/A;  . Lumbar wound debridement N/A 09/01/2013    Procedure: LUMBAR WOUND DEBRIDEMENT;  Surgeon: Karn Cassis, MD;  Location: [facility];  Service: Neurosurgery;  Laterality: N/A;   Social History:  reports that she has never smoked. She has never used smokeless tobacco. She reports that she drinks alcohol. She reports that she does not use illicit drugs.  Family / Support Systems Marital Status: Married How Long?: 40 years Patient Roles: Spouse;Other (Comment) (sister, aunt) Spouse/Significant Other: Tyashia Morrisette - husband  [phone] Other Supports: sisters, niece/nephew, neighbors Anticipated Caregiver: husband, sister, niece/nephew Ability/Limitations of Caregiver: Husband was the planned caregiver.  He does have a leaking heart valve and was recently hospitalized with pneumonia. Caregiver Availability: 24/7 Family Dynamics: close, supportive family  Social History Preferred language: English Religion: Baptist Education: graduated high school Read: Yes Write: Yes Employment Status: Disabled Date Retired/Disabled/Unemployed: March 2013 Legal  Hisotry/Current Legal Issues: None Guardian/Conservator: N/A   Abuse/Neglect Physical Abuse: Denies Verbal Abuse: Denies Sexual Abuse: Denies Exploitation of patient/patient's resources: Denies Self-Neglect: Denies  Emotional Status Pt's affect, behavior and adjustment status: Pt is tearful when she thinks about her husband and his health.  Overall, she is positive and is motivated that her condition will  improve.  Pt reports having anxiety and depression prior to hospitalization and that the new medication the doctor is using here is helping her anxiety. Recent Psychosocial Issues: Pt's husband was recently hospitalized for pneumonia. Pyschiatric History: Anxiety and depression Substance Abuse History: None  Patient / Family Perceptions, Expectations & Goals Pt/Family understanding of illness & functional limitations: Pt feels she has received good information from the doctors and staff and is answering her questions and addressing her concerns. Premorbid pt/family roles/activities: Pt likes to play games and spend time with family.  She also goes with her husband when he plays 9 holes of golf so that she can walk along with him for exercise and spending time together. Anticipated changes in roles/activities/participation: Pt and husband share household tasks so he will be fine to carry the load for a bit.  Pt was paying the bills, so her husband is figuring that out with her direction. Pt/family expectations/goals: Pt would like to "get out of here (rehab) as soon as possible and to do things for myself.)  [employer]: None Premorbid Home Care/DME Agencies: None Transportation available at discharge: family Resource referrals recommended: Neuropsychology  Discharge Planning Living Arrangements: Spouse/significant other Support Systems: Spouse/significant other;Other relatives;Friends/neighbors Type of Residence: Private residence Insurance Resources: Media planner (specify) (Blue Cross Blue Shield) Financial Resources: [employer] Financial Screen Referred: No Living Expenses: Banker Management: Patient;Spouse Does the patient have any problems obtaining your medications?: No Home Management: Pt and husband share household tasks so he will be fine to carry the load for a bit.  Pt was paying the bills, so her husband is figuring that out with her  direction. Patient/Family Preliminary Plans: Pt plans to return to her home with her husband, sister, and other family members to assist. Barriers to Discharge: Steps Social Work Anticipated Follow Up Needs: HH/OP Expected length of stay: 3 weeks  Clinical Impression CSW met with pt to introduce self and complete initial assessment with pt.  Pt was open with CSW and was appreciative of visit.  Pt is very motivated to get better and wants to get home soon.  Her husband has a heart condition and is on the heart transplant list, however he continues to play 9 holes of golf a couple times a week and stays active.  Pt has other extended family members who can help her if husband is having a difficult day.  CSW will continue to follow pt and assist with any needs/concerns going forward and with d/c needs.  Amay Mijangos, [place] 09/15/2013, 9:14 AM

## 2013-09-15 NOTE — Progress Notes (Signed)
Physical Therapy Note  Patient Details  Name: Sherry Wong MRN: [id number] Date of Birth: August 08, 1956 Today's Date: 09/15/2013  1400-1455 (55 minutes) individual Pain: no complaint of pain Focus of treatment: sit to stand for quad strengthening; standing tolerance Treatment: Pt in bed upon arrival; supine to side to sit SBA with bedrail (flat bed); sit to stand using STEDY focusing on bilateral hip/knee extension control avoiding bilateral knee hyperextension; standing tolerance X 6 for 1 minute or less (limited by bilateral knee fatigue).    Averill Pons,JIM 09/15/2013, 2:59 PM

## 2013-09-15 NOTE — Progress Notes (Signed)
Bowel program at 0600. Digstem performed at 0630. Soft stool felt in rectal vault. Large amount of soft stool produced this am.

## 2013-09-15 NOTE — Progress Notes (Signed)
Physical Therapy Session Note  Patient Details  Name: MELAINIE KRINSKY MRN: [id number] Date of Birth: 08-17-1956  Today's Date: 09/15/2013 Time: 6213-0865 Time Calculation (min): 25 min  Short Term Goals: Week 1:  PT Short Term Goal 1 (Week 1): Scoot pivot transfers with mod assist  PT Short Term Goal 2 (Week 1): wheelchair propulsion x 150' with supervision PT Short Term Goal 3 (Week 1): Sit <> stands with max assist.   Skilled Therapeutic Interventions/Progress Updates:    Discussion in regards to d/c planning and home set-up including handout for ramp measurements issued. Practiced up/down ramp and tight turning in w/c x 3 reps with min A at first and then close S.  Therapy Documentation Precautions:  Precautions Precautions: Back;Fall Precaution Booklet Issued: Yes (comment) Precaution Comments: Pt able to recall 2 of 3 Restrictions Weight Bearing Restrictions: No   Pain:  c/o back pain - RN notified for pain medication.  See FIM for current functional status  Therapy/Group: Individual Therapy  Karolee Stamps [facility] 09/15/2013, 4:03 PM

## 2013-09-15 NOTE — Progress Notes (Signed)
Subjective/Complaints: Had a better day yesterday. bp and dizziness were better. Pain under control. Moved bowels  A 12 point review of systems has been performed and if not noted above is otherwise negative.   Objective: Vital Signs: Blood pressure 112/68, pulse 69, temperature 97.8 F (36.6 C), temperature source Oral, resp. rate 18, height 5\' 8"  (1.727 m), weight 109.1 kg (240 lb 8.4 oz), SpO2 100.00%. No results found. No results found for this basename: WBC, HGB, HCT, PLT,  in the last 72 hours  Recent Labs  09/15/13 0525  CREATININE 0.61   CBG (last 3)  No results found for this basename: GLUCAP,  in the last 72 hours  Wt Readings from Last 3 Encounters:  09/15/13 109.1 kg (240 lb 8.4 oz)  09/07/13 112 kg (246 lb 14.6 oz)  09/07/13 112 kg (246 lb 14.6 oz)    Physical Exam:  Constitutional: She is oriented to person, place, and time. She appears well-developed and well-nourished.  HENT:  Head: Normocephalic and atraumatic.  Eyes: Conjunctivae are normal. Pupils are equal, round, and reactive to light.  Neck: Neck supple.  Cardiovascular: Normal rate and regular rhythm.  Respiratory: Effort normal and breath sounds normal. No respiratory distress. She has no wheezes.  GI: Soft. Bowel sounds are normal. She exhibits distension. There is no tenderness. There is no rebound.  Musculoskeletal: She exhibits trace edema.  Neurological: She is alert and oriented to person, place, and time.    2-2+/5 at HF, Right KE 1+, L knee 2, right ankle 1, left ankle 2-. dtr's trace to absent  Skin: Skin is warm and dry.  minimal drainage from back incision (but dressing just changed). Wound drying out. Psychiatric: She has a normal mood and affect. Her behavior is normal. Judgment and thought content normal.    Assessment/Plan: 1. Functional deficits secondary to cauda equina syndrome which require 3+ hours per day of interdisciplinary therapy in a comprehensive inpatient rehab  setting. Physiatrist is providing close team supervision and 24 hour management of active medical problems listed below. Physiatrist and rehab team continue to assess barriers to discharge/monitor patient progress toward functional and medical goals.  Bilateral AFO's ordered to facilitate standing and gait  FIM: FIM - Bathing Bathing Steps Patient Completed: Chest;Right Arm;Left Arm;Abdomen;Front perineal area;Right upper leg;Left upper leg Bathing: 3: Mod-Patient completes 5-7 68f 10 parts or 50-74%  FIM - Upper Body Dressing/Undressing Upper body dressing/undressing steps patient completed: Thread/unthread right sleeve of pullover shirt/dresss;Thread/unthread left sleeve of pullover shirt/dress;Put head through opening of pull over shirt/dress;Pull shirt over trunk Upper body dressing/undressing: 7: Complete Independence: No helper FIM - Lower Body Dressing/Undressing Lower body dressing/undressing steps patient completed: Thread/unthread right pants leg;Thread/unthread left pants leg Lower body dressing/undressing: 2: Max-Patient completed 25-49% of tasks  FIM - Toileting Toileting steps completed by patient: Adjust clothing prior to toileting;Adjust clothing after toileting Toileting Assistive Devices: Grab bar or rail for support Toileting: 3: Mod-Patient completed 2 of 3 steps  FIM - Diplomatic Services operational officer Devices: Psychiatrist Transfers: 1-Mechanical lift Huntley Dec steady)  FIM - Banker Devices: Arm rests Bed/Chair Transfer: 4: Chair or W/C > Bed: Min A (steadying Pt. > 75%)  FIM - Locomotion: Wheelchair Distance: 60 Locomotion: Wheelchair: 2: Travels 50 - 149 ft with supervision, cueing or coaxing FIM - Locomotion: Ambulation Locomotion: Ambulation Assistive Devices: Designer, industrial/product Ambulation/Gait Assistance: 1: +2 Total assist Locomotion: Ambulation: 1: Two helpers  Comprehension Comprehension Mode:  Auditory Comprehension: 6-Follows  complex conversation/direction: With extra time/assistive device  Expression Expression Mode: Verbal Expression: 6-Expresses complex ideas: With extra time/assistive device  Social Interaction Social Interaction: 6-Interacts appropriately with others with medication or extra time (anti-anxiety, antidepressant).  Problem Solving Problem Solving: 6-Solves complex problems: With extra time  Memory Memory: 7-Complete Independence: No helper  Cauda equina syndrome with B/B issues.  1. DVT Prophylaxis/Anticoagulation: Pharmaceutical: Lovenox  -dopplers negative 2. Chronic back pain/Pain Management: prn medcications effective.  3. H/o depression with anxiety/Mood: continue to Provide ego support. 4. Neuropsych: This patient is capable of making decisions on her own behalf.  5. HTN: On HCTZ and cozaar. Decreased cozaar to 25 and set parameters on BP meds to avoid orthostasis.  -held hctz  -TEDS, adequate fluids. ?binder if needed, anxiety control/reassurance 6. ABLA: hgb 9.5, continue iron supplement.  7. Neurogenic Bowel and bladder: improving function -bowel meds--backing off regimen slightly -urecholine for bladder--dc   8. Wound: increased  dressing changes to qid, increased cipro to 500mg  BID to allow skin coverage--continue for now  -may shower if the wound is covered, water-tight (opsite)   LOS (Days) 7 A FACE TO FACE EVALUATION WAS PERFORMED  Yaniris Braddock T 09/15/2013 7:59 AM

## 2013-09-15 NOTE — Progress Notes (Signed)
Occupational Therapy Session Note  Patient Details  Name: Sherry Wong MRN: [id number] Date of Birth: 1956/05/28  Today's Date: 09/15/2013 Time: 0930-1030 Time Calculation (min): 60 min  Short Term Goals: Week 1:  OT Short Term Goal 1 (Week 1): Patient will complete upper body bathing and dressing with supervision to maintain back precautions and setup assistance OT Short Term Goal 2 (Week 1): Patient will complete transfer bed <> w/c with mod assist OT Short Term Goal 3 (Week 1): Patient will demonstrate ability to use toilet aid for toilet hygiene with min assist for thoroughness OT Short Term Goal 4 (Week 1): Patient will complete transfer from w/c to standard tub and tub bench with max assist to manage LE OT Short Term Goal 5 (Week 1): Patient will maintain supported static standing balance during ADL with steadying assist  Skilled Therapeutic Interventions: ADL-retraining with emphasis on use of AE during lower body bathing and dressing, improved activity tolerance, and adapted dressing skills (lateral leans for lower body dressing).   Patient deferred shower this date stating her preference to shower on Friday instead due to planned family reunion on ward on Saturday.   Patient reports contact from MD who advised infrequent showers (not daily).   Patient then completed seated bathing and dressing using AE (reacher and LH sponge) but was unable to manage donning shoes d/t LE weakness and she deferred changing diaper d/t recent BM.   Patient reports increased BM frequency is due to her hx of IBS exacerbated by anxiety.   Patient attempted static standing for assisted dressing 3 times but was unable to rise from w/c to RW with max A X1 due to fatigue.   OT demo'd lateral leans during undressing of lower body prior to bathing and elected to attempt using similar technique to don pants.  Patient donned pants using lateral leans with only mod A to pull up pants while she remained seated in w/c,  leaning forward.    Therapy Documentation Precautions:  Precautions Precautions: Back;Fall Precaution Booklet Issued: Yes (comment) Precaution Comments: Pt able to recall 2 of 3 Restrictions Weight Bearing Restrictions: No  Pain: Pain Assessment Pain Assessment: 0-10 Pain Score: 4  Pain Type: Surgical pain Pain Location: Back Pain Orientation: Lower Pain Descriptors / Indicators: Aching Pain Frequency: Intermittent Patients Stated Pain Goal: 0 Pain Intervention(s): Medication (See eMAR)  ADL: ADL ADL Comments: see FIM  See FIM for current functional status  Therapy/Group: Individual Therapy  Sherry Wong 09/15/2013, 12:38 PM

## 2013-09-15 NOTE — Progress Notes (Signed)
Physical Therapy Note  Patient Details  Name: Sherry Wong MRN: [id number] Date of Birth: 1956/01/23 Today's Date: 09/15/2013  Time: 830-915 45 minutes  1:1 No c/o pain.  Pt performed bed mobility with min A with bed rails, mod A scooting into w/c from bed, cues for anterior lean and pushing through B LEs for transfer.  W/c mobility with supervision 100' before fatigued.  Treatment focused on sit to stands and taking steps in parallel bars.  B ankles wrapped to promote DF.  Pt requires blocking R knee to prevent buckling.  Able to stand with min A in parallel bars. Able to take 4 steps x 5 with mod A for wt shifts and to prevent R knee buckling.  Pt needed occasional assistance with placement of R LE as it tends to adduct during stepping.  Pt required frequent rests but is motivated to improve.  Chris from [facility] present to eval for AFO.  He and this PT agree to wait until closer to d/c to choose appropriate AFO for this patient.   Sherry Wong 09/15/2013, 9:15 AM

## 2013-09-16 ENCOUNTER — Inpatient Hospital Stay (HOSPITAL_COMMUNITY): Payer: BC Managed Care – PPO | Admitting: Physical Therapy

## 2013-09-16 ENCOUNTER — Inpatient Hospital Stay (HOSPITAL_COMMUNITY): Payer: BC Managed Care – PPO

## 2013-09-16 NOTE — Progress Notes (Signed)
Physical Therapy Weekly Progress Note  Patient Details  Name: Sherry Wong MRN: [id number] Date of Birth: [birth year]  Today's Date: 09/16/2013 Time: 4098-1191 Time Calculation (min): 45 min  Patient has met 3 of 3 short term goals.  Pt is making good steady gains. She is currently ambulating short distances at mod assist with bil. ACE wraps to assist with dorsiflexion and foot clearance. She has difficulty with sit <> stand transitions due to weakness, and poor standing tolerance. She continues to demonstrate other deficits including decreased endurance and overall weakness in hip extensors, abductors, dorsiflexors, and hamstrings bil. LEs with Rt. More impaired than Lt. and therefore will continue to benefit from skilled PT intervention to enhance overall performance with activity tolerance, balance, postural control, ability to compensate for deficits, functional use of  right lower extremity and left lower extremity and knowledge of precautions.  Patient progressing toward long term goals..  Continue plan of care.  PT Short Term Goals Week 1:  PT Short Term Goal 1 (Week 1): Scoot pivot transfers with mod assist  PT Short Term Goal 1 - Progress (Week 1): Met PT Short Term Goal 2 (Week 1): wheelchair propulsion x 150' with supervision PT Short Term Goal 2 - Progress (Week 1): Met PT Short Term Goal 3 (Week 1): Sit <> stands with max assist.  PT Short Term Goal 3 - Progress (Week 1): Met  Skilled Therapeutic Interventions/Progress Updates:    Pt supine in bed, concerned about increased fluid in bil. LEs. RN aware. Bil. LE supine exercises: ankle plantarflexion/dorsiflexion (AAROM Lt, PROM Rt and both taken into overpressure for passive stretch of heel cord); knee flexion/extension, straight leg raises. Sit <> stands from bed x 4 reps working on anterior weight shifting during transition, Rt. Knee blocked for safety RW needed to complete stand. Stand pivot transfer with RW and ACE wrap for  bil. LE dorsiflexion assist. Wheelchair propulsion for strengthening and conditioning of all 4 extremities.   Orthostatic BPs  Supine 130/79  Sitting 116/76  Standing 129/87    Therapy Documentation Precautions:  Precautions Precautions: Back;Fall Precaution Booklet Issued: Yes (comment) Precaution Comments: Pt able to recall 2 of 3 Restrictions Weight Bearing Restrictions: No Pain: Pain Assessment Faces Pain Scale: Hurts a little bit Pain Type: Surgical pain Pain Location: Back Pain Orientation: Posterior Pain Descriptors / Indicators: Aching Pain Onset: On-going Pain Intervention(s): Repositioned  See FIM for current functional status  Therapy/Group: Individual Therapy  Wilhemina Bonito 09/16/2013, 3:42 PM

## 2013-09-16 NOTE — Progress Notes (Signed)
Physical Therapy Session Note  Patient Details  Name: Sherry Wong MRN: [id number] Date of Birth: July 05, 1956  Today's Date: 09/16/2013 Time: 0730-0830 Time Calculation (min): 60 min  Short Term Goals: Week 1:  PT Short Term Goal 1 (Week 1): Scoot pivot transfers with mod assist  PT Short Term Goal 2 (Week 1): wheelchair propulsion x 150' with supervision PT Short Term Goal 3 (Week 1): Sit <> stands with max assist.   First Skilled Therapeutic Interventions/Progress Updates:    Pt seated and dressed ready for therapy! Reports some disappointment with not receiving AFOs yesterday, states prosthetist felt she would need bracing up to her hips. I will contact [facility] to discuss with prosthetist. Wheelchair propulsion with encouragement to utilize bil. LEs, supervision. Scoot pivot transfers with min-guard assist to elevated mat. Supine Bil. LE AROM/AAROM: hip abduction/adduction with friction reduced; straight leg raise. Rt. LE weaker than Lt, hip abductors also very weak. Sit <> stands from elevated mat with min assist, PT to block Rt. Knee. Cues for decreasing bil. Hip internal rotation with transition to standing. Stress induced bowel incontinence, returned to room and cleaned, standing endurance x 3 bouts of ~1 min each.   Second Session Time: 1135-1203  Time Calculation (min): 32 min Skilled Therapeutic Interventions/Progress Updates:  Family present (husband and sister) discussed wheelchair access at home. Husband to measure doorways, ramp already underway. Supine > sit; scoot transfer to wheelchair supervision/set-up. Ambulation x 10', 3', 4' with RW and mod assist. Continues to require heavy mod assist for sit>stand from wheelchair.   Therapy Documentation Precautions:  Precautions Precautions: Back;Fall Precaution Booklet Issued: Yes (comment) Precaution Comments: Pt able to recall 2 of 3 Restrictions Weight Bearing Restrictions: No Pain: Pain Assessment Both sessions 4/10  low back, repositioned and premedicated.  See FIM for current functional status  Therapy/Group: Individual Therapy both sessions  Wilhemina Bonito 09/16/2013, 12:26 PM

## 2013-09-16 NOTE — Progress Notes (Signed)
Unattached open areas x 2 to upper and mid incision  Minimal amount of serosanguinous drainage noted on dressing from upper unattached area of incision to distal end. Dressing changed, per order.

## 2013-09-16 NOTE — Progress Notes (Signed)
Subjective/Complaints: No new problems. Pain controlled. Able to sleep last night. A 12 point review of systems has been performed and if not noted above is otherwise negative.   Objective: Vital Signs: Blood pressure 104/71, pulse 71, temperature 97.9 F (36.6 C), temperature source Oral, resp. rate 20, height 5\' 8"  (1.727 m), weight 109.1 kg (240 lb 8.4 oz), SpO2 100.00%. No results found. No results found for this basename: WBC, HGB, HCT, PLT,  in the last 72 hours  Recent Labs  09/15/13 0525  CREATININE 0.61   CBG (last 3)  No results found for this basename: GLUCAP,  in the last 72 hours  Wt Readings from Last 3 Encounters:  09/15/13 109.1 kg (240 lb 8.4 oz)  09/07/13 112 kg (246 lb 14.6 oz)  09/07/13 112 kg (246 lb 14.6 oz)    Physical Exam:  Constitutional: She is oriented to person, place, and time. She appears well-developed and well-nourished.  HENT:  Head: Normocephalic and atraumatic.  Eyes: Conjunctivae are normal. Pupils are equal, round, and reactive to light.  Neck: Neck supple.  Cardiovascular: Normal rate and regular rhythm.  Respiratory: Effort normal and breath sounds normal. No respiratory distress. She has no wheezes.  GI: Soft. Bowel sounds are normal. She exhibits distension. There is no tenderness. There is no rebound.  Musculoskeletal: She exhibits trace edema.  Neurological: She is alert and oriented to person, place, and time.    2-2+/5 at HF, Right KE 1+, L knee 2, right ankle 1, left ankle 2-. dtr's trace to absent  Skin: Skin is warm and dry.  minimal drainage from back incision (but dressing just changed). Wound drying out. Psychiatric: She has a normal mood and affect. Her behavior is normal. Judgment and thought content normal.    Assessment/Plan: 1. Functional deficits secondary to cauda equina syndrome which require 3+ hours per day of interdisciplinary therapy in a comprehensive inpatient rehab setting. Physiatrist is providing  close team supervision and 24 hour management of active medical problems listed below. Physiatrist and rehab team continue to assess barriers to discharge/monitor patient progress toward functional and medical goals.  Bilateral AFO's ordered to facilitate standing and gait---discussing with therapy and orthotist regarding type and timing  FIM: FIM - Bathing Bathing Steps Patient Completed: Chest;Right Arm;Left Arm;Abdomen;Right upper leg;Left upper leg;Right lower leg (including foot);Left lower leg (including foot) Bathing: 4: Min-Patient completes 8-9 23f 10 parts or 75+ percent  FIM - Upper Body Dressing/Undressing Upper body dressing/undressing steps patient completed: Thread/unthread right bra strap;Thread/unthread left bra strap;Thread/unthread right sleeve of pullover shirt/dresss;Thread/unthread left sleeve of pullover shirt/dress;Put head through opening of pull over shirt/dress;Pull shirt over trunk Upper body dressing/undressing: 7: Complete Independence: No helper FIM - Lower Body Dressing/Undressing Lower body dressing/undressing steps patient completed: Thread/unthread right pants leg;Thread/unthread left pants leg Lower body dressing/undressing: 2: Max-Patient completed 25-49% of tasks  FIM - Toileting Toileting steps completed by patient: Adjust clothing prior to toileting;Adjust clothing after toileting Toileting Assistive Devices: Grab bar or rail for support Toileting: 3: Mod-Patient completed 2 of 3 steps  FIM - Diplomatic Services operational officer Devices: Bedside commode Toilet Transfers: 1-Mechanical lift [place] Dec steady)  FIM - Banker Devices: Arm rests Bed/Chair Transfer: 4: Chair or W/C > Bed: Min A (steadying Pt. > 75%)  FIM - Locomotion: Wheelchair Distance: 60 Locomotion: Wheelchair: 2: Travels 50 - 149 ft with supervision, cueing or coaxing FIM - Locomotion: Ambulation Locomotion: Ambulation Assistive Devices:  Designer, industrial/product Ambulation/Gait Assistance: 1: +  2 Total assist Locomotion: Ambulation: 1: Two helpers  Comprehension Comprehension Mode: Auditory Comprehension: 7-Follows complex conversation/direction: With no assist  Expression Expression Mode: Verbal Expression: 7-Expresses complex ideas: With no assist  Social Interaction Social Interaction: 7-Interacts appropriately with others - No medications needed.  Problem Solving Problem Solving: 7-Solves complex problems: Recognizes & self-corrects  Memory Memory: 7-Complete Independence: No helper  Cauda equina syndrome with B/B issues.  1. DVT Prophylaxis/Anticoagulation: Pharmaceutical: Lovenox  -dopplers negative 2. Chronic back pain/Pain Management: prn medcications effective.  3. H/o depression with anxiety/Mood: continue to Provide ego support. 4. Neuropsych: This patient is capable of making decisions on her own behalf.  5. HTN: On HCTZ and cozaar. Decreased cozaar to 25 and set parameters on BP meds to avoid orthostasis.  -held hctz  -TEDS, adequate fluids. ?binder if needed, anxiety control/reassurance  -overall improved 6. ABLA: hgb 9.5, continue iron supplement.  7. Neurogenic Bowel and bladder: improving function -bowel meds--backing off regimen slightly -urecholine for bladder--dc'ed   8. Wound: increased  dressing changes to qid, increased cipro to 500mg  BID to allow skin coverage--continue for now  -may shower if the wound is covered, water-tight (opsite)   LOS (Days) 8 A FACE TO FACE EVALUATION WAS PERFORMED  SWARTZ,ZACHARY T 09/16/2013 7:56 AM

## 2013-09-16 NOTE — Plan of Care (Signed)
Problem: [facility] SAFETY Goal: RH STG ADHERE TO SAFETY PRECAUTIONS W/ASSISTANCE/DEVICE STG Adhere to Safety Precautions With Assistance/Device. Supervision  Calls appropriately for assistance

## 2013-09-16 NOTE — Plan of Care (Signed)
Problem: [facility] PAIN MANAGEMENT Goal: RH STG PAIN MANAGED AT OR BELOW PT'S PAIN GOAL 3 or less on scale 0-10  Outcome: Not Progressing Reporting pain as a 4 at this time

## 2013-09-16 NOTE — Patient Care Conference (Signed)
Inpatient RehabilitationTeam Conference and Plan of Care Update Date: 09/14/2013   Time: 2:30 PM    Patient Name: Sherry Wong      Medical Record Number: [id number]  Date of Birth: 07-Jan-1956 Sex: Female         Room/Bed: 4W02C/4W02C-01 Payor Info: Payor: BLUE CROSS BLUE SHIELD / Plan: BCBS PPO OUT OF STATE / Product Type: *No Product type* /    Admitting Diagnosis: CAUDA EQUINA SYNDROME  Admit Date/Time:  09/08/2013  4:00 PM Admission Comments: No comment available   Primary Diagnosis:  <principal problem not specified> Principal Problem: <principal problem not specified>  Patient Active Problem List   Diagnosis Date Noted  . Cauda equina syndrome 09/08/2013  . Morbid obesity 08/26/2013  . HTN (hypertension) 08/26/2013  . Hypoxemia 08/26/2013  . Unspecified hypothyroidism 08/26/2013  . Lumbar degenerative disc disease 08/25/2013    Expected Discharge Date: Expected Discharge Date: 09/29/13  Team Members Present: Physician leading conference: Dr. Faith Rogue Nurse Present: Leland Johns, RN PT Present: Karolee Stamps, Jerrye Bushy, PT OT Present: Donzetta Kohut, OT;Patricia Olean Ree, OT PPS Coordinator present : Tora Duck, RN, CRRN;Becky Henrene Dodge, PT     Current Status/Progress Goal Weekly Team Focus  Medical   stress induced bowel incontinence, wound care, orthostasis  anxiety mgt, pain mgt to help allow her to particapte in therapy  bladder and bowel mgt, wound care   Bowel/Bladder   Daily bowel program (suppository and dig stim 30 min later), incontinent episodes occuring when patient stands; continent of bladder-checking PVRs  Manage bowel with bowel program; continent of bladder  Encourage patient to get up to bedside commode during bowel program (patient wants to use bedpan)   Swallow/Nutrition/ Hydration             ADL's   Max A for sit>stand and lower body dressing, mod A for squat-pivot transfer from bed>w/c, supervision for upper body ADL   Mod I for ADL and transfers, supervision for homemaking  Transfer training, endurance, use of AE for lower body ADL to maintain back precautions, toileting   Mobility   Max assist transfers, really unable to ambulate  Mod I wheelchair level, min assist short distance gait  transfers, standing balance/tolerance, generalized strength and conditioning   Communication             Safety/Cognition/ Behavioral Observations            Pain   Moderate-severe pain in lower back managed with 2 percocet q4hr PRN  </=3  Medicate/reposition patient prior to pain escalating to a severe level   Skin   Incision to back with sutures-pink/yellow with moderate serosanguinous drainage; and incision to left buttock with staples  Healed incision without infection  Continue qid dressing changes    Rehab Goals Patient on target to meet rehab goals: Yes Rehab Goals Revised: None *See Care Plan and progress notes for long and short-term goals.  Barriers to Discharge: anxiety, neurological deficits, orthostasis    Possible Resolutions to Barriers:  education, reassurance, pain control    Discharge Planning/Teaching Needs:  Pt plans to go home to the care of her husband, sister, and other family members as needed.  Family will participate in family education prior to d/c.   Team Discussion:  Team feels pt is making gradual improvements medically.  She is on a bowel program and still has two small areas from her back incision that are leaking.  Pt is anxious at times and Neuropsychology referral was  discussed. She started ambulating on day of conference.  She has short distance ambulation goals.  She has good recall of and adheres to back precautions.  Pt has 7 steps to enter house and team discussed possible need for a ramp.  Team is also recommending/ordering bilateral AFOs.    Revisions to Treatment Plan:  None   Continued Need for Acute Rehabilitation Level of Care: The patient requires daily medical  management by a physician with specialized training in physical medicine and rehabilitation for the following conditions: Daily direction of a multidisciplinary physical rehabilitation program to ensure safe treatment while eliciting the highest outcome that is of practical value to the patient.: Yes Daily medical management of patient stability for increased activity during participation in an intensive rehabilitation regime.: Yes Daily analysis of laboratory values and/or radiology reports with any subsequent need for medication adjustment of medical intervention for : Neurological problems;Post surgical problems  Tam Delisle, Vista Deck 09/16/2013, 9:12 AM

## 2013-09-16 NOTE — Progress Notes (Signed)
Social Work Patient ID: Sherry Wong, female   DOB: 04-22-1956, 58 y.o.   MRN: [id number]  CSW met with pt and called husband to update them on team conference discussion.  CSW gave pt her d/c date and she was pleased with that, although she and husband were both a little hesitant of what pt would be able to do at that time.  Explained to them that the therapists felt that she would accomplish the goals they set for her by 09-29-13.  CSW listened to pt's husband as he explained pt's acute hospitalization and the events that transpired.  Husband is trying to get answers from the surgeon about why pt has experienced so many complications.  Pt himself was just d/c'd from the hospital this week due to having pneumonia.  Pt was pleased that he is feeling better and that she will be home in time to spend New Years with her family.  Pt and husband are very close and they both report doing better when they are together.

## 2013-09-16 NOTE — Plan of Care (Signed)
Problem: RH SKIN INTEGRITY Goal: RH STG SKIN FREE OF INFECTION/BREAKDOWN No new skin breakdown or infection while in rehab  Outcome: Progressing No additional breakdown noted

## 2013-09-16 NOTE — Plan of Care (Signed)
Problem: RH SKIN INTEGRITY Goal: RH STG ABLE TO PERFORM INCISION/WOUND CARE W/ASSISTANCE STG Able To Perform Incision/Wound Care With mod Assistance.  Outcome: Progressing Orders for frequent dressing changes on lumbar

## 2013-09-16 NOTE — Progress Notes (Signed)
Occupational Therapy Session and Weekly Progress Note  Patient Details  Name: Sherry Wong MRN: [id number] Date of Birth: Sep 17, 1956  Today's Date: 09/16/2013 Time: 2130-8657 Time Calculation (min): 45 min  Patient has met 4 of 5 short term goals.  Patient is progressing with goal #4, max assist transfer to tub bench, but deferred until 09/17/13 due to wound status.  Patient continues to demonstrate the following deficits: impaired transfers, general weakness, anxiety with increased symptoms of IBS, impaired endurance, and therefore will continue to benefit from skilled OT intervention to enhance overall performance with BADL and iADL.  Patient progressing toward long term goals..  Continue plan of care.  OT Short Term Goals Week 1:  OT Short Term Goal 1 (Week 1): Patient will complete upper body bathing and dressing with supervision to maintain back precautions and setup assistance OT Short Term Goal 1 - Progress (Week 1): Met OT Short Term Goal 2 (Week 1): Patient will complete transfer bed <> w/c with mod assist OT Short Term Goal 2 - Progress (Week 1): Met OT Short Term Goal 3 (Week 1): Patient will demonstrate ability to use toilet aid for toilet hygiene with min assist for thoroughness OT Short Term Goal 3 - Progress (Week 1): Met OT Short Term Goal 4 (Week 1): Patient will complete transfer from w/c to standard tub and tub bench with max assist to manage LE OT Short Term Goal 4 - Progress (Week 1): Progressing toward goal OT Short Term Goal 5 (Week 1): Patient will maintain supported static standing balance during ADL with steadying assist OT Short Term Goal 5 - Progress (Week 1): Met (using Toniann Ket) Week 2:  OT Short Term Goal 1 (Week 2): Patient will complete transfer from w/c to standard tub and tub bench with max assist to manage LE OT Short Term Goal 2 (Week 2): Patient will complete lower body bathing with min assist to maintain back precautions OT Short Term Goal 3  (Week 2): Patient will complete lower body dressing with min assist OT Short Term Goal 4 (Week 2): Patient will complete simple meal prep using LRAD with min assist OT Short Term Goal 5 (Week 2): Patient will demo ability to complete HEP for general strengthening independently  Skilled Therapeutic Interventions: ADL-retraining with emphasis on improved endurance, transfer training, skilled use of AE for lower body bathing/dressing, adherence to back precautions, and static standing tolerance.   Patient completed bathing at sink but reported plan for shower using wound barrier only on 09/17/13 to reduce risk of contamination to wound.   Pt was educated on tub bench transfer method and rehearsed approach to bench using w/c.   Bench required 3 adjustments to insure ease of squat-pivot and lateral scoot required.   Patient completed seated bathing and dressing and donned her left shoe unassisted this date but continues to require assist with donning right shoe and pulling up her pants.   Patient was assisted to static standing using Toniann Ket to allow RN to complete dressing change and was then transferred back to bed using Toniann Ket to conserve energy and to elevate her legs to reduce edema.     Therapy Documentation Precautions:  Precautions Precautions: Back;Fall Precaution Booklet Issued: Yes (comment) Precaution Comments: Pt able to recall 2 of 3 Restrictions Weight Bearing Restrictions: No   Pain: Pain Assessment Faces Pain Scale: Hurts a little bit Pain Type: Surgical pain Pain Location: Back Pain Orientation: Posterior Pain Descriptors / Indicators: Aching Pain Onset: On-going Pain  Intervention(s): Repositioned  ADL: ADL ADL Comments: see FIM  See FIM for current functional status  Therapy/Group: Individual Therapy  Samoria Fedorko 09/16/2013, 3:41 PM

## 2013-09-17 ENCOUNTER — Inpatient Hospital Stay (HOSPITAL_COMMUNITY): Payer: BC Managed Care – PPO | Admitting: Physical Therapy

## 2013-09-17 ENCOUNTER — Inpatient Hospital Stay (HOSPITAL_COMMUNITY): Payer: BC Managed Care – PPO

## 2013-09-17 ENCOUNTER — Inpatient Hospital Stay (HOSPITAL_COMMUNITY): Payer: BC Managed Care – PPO | Admitting: *Deleted

## 2013-09-17 MED ORDER — BISACODYL 10 MG RE SUPP
10.0000 mg | RECTAL | Status: DC
  Administered 2013-09-17 – 2013-09-27 (×8): 10 mg via RECTAL
  Filled 2013-09-17 (×9): qty 1

## 2013-09-17 NOTE — Progress Notes (Signed)
Patient given suppository at 0600.  At 0630, patient had urge to have bowel movement.  Patient has a large, soft, continent bowel movement on bedside commode.  Dig stim not performed per patient request.  Will continue to monitor.

## 2013-09-17 NOTE — Progress Notes (Signed)
Subjective/Complaints: Notes some swelling in her legs. Otherwise feeling well. Walked about 17ft with therapy yesterday A 12 point review of systems has been performed and if not noted above is otherwise negative.   Objective: Vital Signs: Blood pressure 120/82, pulse 97, temperature 98.1 F (36.7 C), temperature source Oral, resp. rate 20, height 5\' 8"  (1.727 m), weight 109.1 kg (240 lb 8.4 oz), SpO2 100.00%. No results found. No results found for this basename: WBC, HGB, HCT, PLT,  in the last 72 hours  Recent Labs  09/15/13 0525  CREATININE 0.61   CBG (last 3)  No results found for this basename: GLUCAP,  in the last 72 hours  Wt Readings from Last 3 Encounters:  09/15/13 109.1 kg (240 lb 8.4 oz)  09/07/13 112 kg (246 lb 14.6 oz)  09/07/13 112 kg (246 lb 14.6 oz)    Physical Exam:  Constitutional: She is oriented to person, place, and time. She appears well-developed and well-nourished.  HENT:  Head: Normocephalic and atraumatic.  Eyes: Conjunctivae are normal. Pupils are equal, round, and reactive to light.  Neck: Neck supple.  Cardiovascular: Normal rate and regular rhythm.  Respiratory: Effort normal and breath sounds normal. No respiratory distress. She has no wheezes.  GI: Soft. Bowel sounds are normal. She exhibits distension. There is no tenderness. There is no rebound.  Musculoskeletal: She exhibits trace edema.  Neurological: She is alert and oriented to person, place, and time.    2-2+/5 at HF, Right KE 1+, L knee 2, right ankle 1, left ankle 2-. dtr's trace to absent  Skin: Skin is warm and dry.  minimal drainage from back incision (but dressing just changed). Wound drying out. Psychiatric: She has a normal mood and affect. Her behavior is normal. Judgment and thought content normal.    Assessment/Plan: 1. Functional deficits secondary to cauda equina syndrome which require 3+ hours per day of interdisciplinary therapy in a comprehensive inpatient rehab  setting. Physiatrist is providing close team supervision and 24 hour management of active medical problems listed below. Physiatrist and rehab team continue to assess barriers to discharge/monitor patient progress toward functional and medical goals.  Bilateral AFO's ordered to facilitate standing and gait---discussed with prosthetist today---they will come to measure.  FIM: FIM - Bathing Bathing Steps Patient Completed: Chest;Right Arm;Left Arm;Abdomen;Right upper leg;Left upper leg;Right lower leg (including foot);Left lower leg (including foot) Bathing: 4: Min-Patient completes 8-9 54f 10 parts or 75+ percent  FIM - Upper Body Dressing/Undressing Upper body dressing/undressing steps patient completed: Thread/unthread right bra strap;Thread/unthread left bra strap;Thread/unthread right sleeve of pullover shirt/dresss;Thread/unthread left sleeve of pullover shirt/dress;Put head through opening of pull over shirt/dress;Pull shirt over trunk Upper body dressing/undressing: 7: Complete Independence: No helper FIM - Lower Body Dressing/Undressing Lower body dressing/undressing steps patient completed: Thread/unthread right pants leg;Thread/unthread left pants leg Lower body dressing/undressing: 2: Max-Patient completed 25-49% of tasks  FIM - Toileting Toileting steps completed by patient: Adjust clothing prior to toileting;Adjust clothing after toileting Toileting Assistive Devices: Grab bar or rail for support Toileting: 3: Mod-Patient completed 2 of 3 steps  FIM - Diplomatic Services operational officer Devices: Bedside commode Toilet Transfers: 1-Mechanical lift Huntley Dec steady)  FIM - Banker Devices: Arm rests Bed/Chair Transfer: 5: Supine > Sit: Supervision (verbal cues/safety issues);5: Bed > Chair or W/C: Supervision (verbal cues/safety issues);5: Chair or W/C > Bed: Supervision (verbal cues/safety issues)  FIM - Locomotion:  Wheelchair Distance: 60 Locomotion: Wheelchair: 5: Travels 150 ft or more: maneuvers  on rugs and over door sills with supervision, cueing or coaxing FIM - Locomotion: Ambulation Locomotion: Ambulation Assistive Devices: Walker - Rolling Ambulation/Gait Assistance: 3: Mod assist Locomotion: Ambulation: 1: Travels less than 50 ft with moderate assistance (Pt: 50 - 74%)  Comprehension Comprehension Mode: Auditory Comprehension: 7-Follows complex conversation/direction: With no assist  Expression Expression Mode: Verbal Expression: 7-Expresses complex ideas: With no assist  Social Interaction Social Interaction: 6-Interacts appropriately with others with medication or extra time (anti-anxiety, antidepressant).  Problem Solving Problem Solving: 7-Solves complex problems: Recognizes & self-corrects  Memory Memory: 7-Complete Independence: No helper  Cauda equina syndrome with B/B issues.  1. DVT Prophylaxis/Anticoagulation: Pharmaceutical: Lovenox  -dopplers negative 2. Chronic back pain/Pain Management: prn medcications effective.  3. H/o depression with anxiety/Mood: continue to Provide ego support. 4. Neuropsych: This patient is capable of making decisions on her own behalf.  5. HTN: On HCTZ and cozaar. Decreased cozaar to 25 and set parameters on BP meds to avoid orthostasis.  -continue to hold hctz  -TEDS, adequate fluids. ?binder if needed, anxiety control/reassurance  -overall improved 6. ABLA: hgb 9.5, continue iron supplement.  7. Neurogenic Bowel and bladder: improving function -bowel meds--backing off regimen slightly -urecholine for bladder--dc'ed   8. Wound: increased  dressing changes to qid, increased cipro to 500mg  BID to allow skin coverage--continue for now  -may shower if the wound is covered, water-tight (opsite)   LOS (Days) 9 A FACE TO FACE EVALUATION WAS PERFORMED  SWARTZ,ZACHARY T 09/17/2013 8:09 AM

## 2013-09-17 NOTE — Progress Notes (Signed)
Occupational Therapy Session Note  Patient Details  Name: Sherry Wong MRN: [id number] Date of Birth: December 02, 1955  Today's Date: 09/17/2013 Time: 1000-1100 Time Calculation (min): 60 min  Short Term Goals: Week 2:  OT Short Term Goal 1 (Week 2): Patient will complete transfer from w/c to standard tub and tub bench with max assist to manage LE OT Short Term Goal 2 (Week 2): Patient will complete lower body bathing with min assist to maintain back precautions OT Short Term Goal 3 (Week 2): Patient will complete lower body dressing with min assist OT Short Term Goal 4 (Week 2): Patient will complete simple meal prep using LRAD with min assist OT Short Term Goal 5 (Week 2): Patient will demo ability to complete HEP for general strengthening independently  Skilled Therapeutic Interventions: ADL-retraining with emphasis on transfers: w/c <> DA BSC & w/c <> tub bench, bathing, back precautions and endurance.   Patient completed squat pivot transfer to DA [facility] with mod assist, voided BM w/o hygiene, returned to w/c and transferred to tub bench with min A to complete seated bathing.   Patient was too fatigued after shower to attempt return transfer and was assisted from sit/stand using Huntley Dec Steady to transfer to edge of bed to complete dressing with max assist for lower body and only setup for upper body dressing.    Therapy Documentation Precautions:  Precautions Precautions: Back;Fall Precaution Booklet Issued: Yes (comment) Precaution Comments: Pt able to recall 2 of 3 Restrictions Weight Bearing Restrictions: No  Pain: Pain Assessment Pain Assessment: 0-10 Pain Score: 4   See FIM for current functional status  Therapy/Group: Individual Therapy  Destin Kittler 09/17/2013, 1:01 PM

## 2013-09-17 NOTE — Progress Notes (Signed)
Physical Therapy Session Note  Patient Details  Name: Sherry Wong MRN: [id number] Date of Birth: 12-Sep-1956  Today's Date: 09/17/2013 Time: 4098-1191 Time Calculation (min): 40 min  Short Term Goals: Week 2:  PT Short Term Goal 1 (Week 2): Scoot transfers to/from level surfaces with supervision. PT Short Term Goal 2 (Week 2): Sit <> stand from wheelchair with moderate assistance PT Short Term Goal 3 (Week 2): Ambulation x 10' with RW and mod assist on consistant basis  Skilled Therapeutic Interventions/Progress Updates:    Patient received supine in bed. Session focused on functional transfers and assessment with orthotist for appropriate bracing. Patient performs scoot pivot transfer bed>wheelchair with supervision/set up assist. Patient propelled wheelchair with B UE >150' x2 with supervision. Orthotist present to observe/assess for appropriate bracing. Patient initially with B anterior tibial toe off AFO. Patient performed sit<>stand from wheelchair with RW with maxA. In standing, patient states she does not want to perform stand pivot transfer to the mat, so instead patient performed marching, demonstrating very little hip flexion bilaterally, just B knee flexion, resulting in some PF bilaterally. Orthotist provides L anterior tibial toe off blue rocker and 1/4" heel lift in B shoes. Patient performs sit>stand from wheelchair with RW and modA. Patient reports feeling more stable. Orthotist recommending B anterior tibial blue rocker toe off AFO for functional gait training. Patient returned to room and left sitting in wheelchair with all needs within reach.  Therapy Documentation Precautions:  Precautions Precautions: Back;Fall Restrictions Weight Bearing Restrictions: No Pain: Pain Assessment Pain Assessment: No/denies pain Pain Score: 0-No pain Locomotion : Ambulation Ambulation/Gait Assistance: Not tested (comment)   See FIM for current functional status  Therapy/Group:  Individual Therapy  Chipper Herb. Shaneya Taketa, PT, DPT 09/17/2013, 2:40 PM

## 2013-09-17 NOTE — Progress Notes (Signed)
Patient ID: Sherry Wong, female   DOB: [birth year], 57 y.o.   MRN: [id number] Neuro unchanged. No fever. Some drainage from lumbar wound. Skin edges clean. Plan remove stiches

## 2013-09-17 NOTE — Progress Notes (Signed)
Physical Therapy Session Note  Patient Details  Name: Sherry Wong MRN: [id number] Date of Birth: 05-Sep-1956  Today's Date: 09/17/2013 Time:  0730- 0830  60 min total  Short Term Goals: Week 2:  PT Short Term Goal 1 (Week 2): Scoot transfers to/from level surfaces with supervision. PT Short Term Goal 2 (Week 2): Sit <> stand from wheelchair with moderate assistance PT Short Term Goal 3 (Week 2): Ambulation x 10' with RW and mod assist on consistant basis  First Skilled Therapeutic Interventions/Progress Updates:    Pt having bit of an off day, weaker and having more back pain (7/10). Sit <> stands praciticing different hand placements, pt needs most assist with weight shift forwards, Rt. Knee blocked (minimally) to avoid buckling. Bil. AFO (from our unit) donned, pt found them beneficial. Standing balance task decreasing UE support to one UE. Pt limited to ~30-45 sec standing tolerance.   Second Session Time:  1100-1200 Time Calculation (min): 60 min Skilled Therapeutic Interventions/Progress Updates:   Pt very tired and tearful, still feeling weaker than normal. BP 99/66, HR 98, SpO2 98% on RA. Scoot transfers min-guard/supervision. Sit <> stands x 5 mod/max assist attempted forwards foot taps but unable. Sit <> supine supervision. Supine bil. LE exercise: heel slides, hamstring strengthening, hip abduction/adduction with hip flexed and fully extended. Pt reports "I just really don't feel good today, I don't know what is wrong."   Therapy Documentation Precautions:  Precautions Precautions: Back;Fall Precaution Booklet Issued: Yes (comment) Precaution Comments: Pt able to recall 2 of 3 Restrictions Weight Bearing Restrictions: No Pain:  7/10 both sessions  See FIM for current functional status  Therapy/Group: Individual Therapy both sessions  Wilhemina Bonito 09/17/2013, 8:31 AM

## 2013-09-18 ENCOUNTER — Inpatient Hospital Stay (HOSPITAL_COMMUNITY): Payer: BC Managed Care – PPO

## 2013-09-18 ENCOUNTER — Encounter (HOSPITAL_COMMUNITY): Payer: Self-pay | Admitting: Internal Medicine

## 2013-09-18 ENCOUNTER — Inpatient Hospital Stay (HOSPITAL_COMMUNITY): Payer: BC Managed Care – PPO | Admitting: *Deleted

## 2013-09-18 DIAGNOSIS — R0902 Hypoxemia: Secondary | ICD-10-CM

## 2013-09-18 LAB — BASIC METABOLIC PANEL
BUN: 21 mg/dL (ref 6–23)
CO2: 24 mEq/L (ref 19–32)
Calcium: 8.7 mg/dL (ref 8.4–10.5)
Chloride: 101 mEq/L (ref 96–112)
Creatinine, Ser: 0.65 mg/dL (ref 0.50–1.10)
GFR calc Af Amer: >90 mL/min (ref >90)
GFR calc non Af Amer: >90 mL/min (ref >90)
Glucose, Bld: 139 mg/dL — ABNORMAL HIGH (ref 70–99)
Potassium: 2.5 mEq/L — CL (ref 3.5–5.1)
Sodium: 137 mEq/L (ref 135–145)

## 2013-09-18 LAB — CBC
HCT: 32 % — ABNORMAL LOW (ref 36.0–46.0)
Hemoglobin: 10.1 g/dL — ABNORMAL LOW (ref 12.0–15.0)
MCH: 29.4 pg (ref 26.0–34.0)
MCHC: 31.6 g/dL (ref 30.0–36.0)
MCV: 93.3 fL (ref 78.0–100.0)
Platelets: 233 10*3/uL (ref 150–400)
RBC: 3.43 MIL/uL — ABNORMAL LOW (ref 3.87–5.11)
RDW: 15.1 % (ref 11.5–15.5)
WBC: 5.8 10*3/uL (ref 4.0–10.5)

## 2013-09-18 MED ORDER — IPRATROPIUM BROMIDE 0.02 % IN SOLN
0.5000 mg | RESPIRATORY_TRACT | Status: DC
  Administered 2013-09-18 (×2): 0.5 mg via RESPIRATORY_TRACT
  Filled 2013-09-18 (×2): qty 2.5

## 2013-09-18 MED ORDER — IPRATROPIUM BROMIDE 0.02 % IN SOLN: 0.5000 mg | RESPIRATORY_TRACT | Status: DC | PRN

## 2013-09-18 MED ORDER — IOHEXOL 350 MG/ML SOLN
100.0000 mL | Freq: Once | INTRAVENOUS | Status: AC | PRN
  Administered 2013-09-18: 100 mL via INTRAVENOUS

## 2013-09-18 MED ORDER — ALBUTEROL SULFATE (5 MG/ML) 0.5% IN NEBU
2.5000 mg | INHALATION_SOLUTION | Freq: Four times a day (QID) | RESPIRATORY_TRACT | Status: DC
  Administered 2013-09-18 – 2013-09-19 (×2): 2.5 mg via RESPIRATORY_TRACT
  Filled 2013-09-18 (×2): qty 0.5

## 2013-09-18 MED ORDER — ALBUTEROL SULFATE (5 MG/ML) 0.5% IN NEBU
2.5000 mg | INHALATION_SOLUTION | Freq: Once | RESPIRATORY_TRACT | Status: AC
  Administered 2013-09-18: 2.5 mg via RESPIRATORY_TRACT

## 2013-09-18 MED ORDER — POTASSIUM CHLORIDE CRYS ER 20 MEQ PO TBCR
20.0000 meq | EXTENDED_RELEASE_TABLET | Freq: Four times a day (QID) | ORAL | Status: DC
  Administered 2013-09-18 – 2013-09-19 (×4): 20 meq via ORAL
  Filled 2013-09-18 (×8): qty 1

## 2013-09-18 MED ORDER — IPRATROPIUM BROMIDE 0.02 % IN SOLN
0.5000 mg | Freq: Four times a day (QID) | RESPIRATORY_TRACT | Status: DC
  Administered 2013-09-18 – 2013-09-19 (×2): 0.5 mg via RESPIRATORY_TRACT
  Filled 2013-09-18 (×2): qty 2.5

## 2013-09-18 MED ORDER — ALBUTEROL SULFATE (5 MG/ML) 0.5% IN NEBU
2.5000 mg | INHALATION_SOLUTION | RESPIRATORY_TRACT | Status: DC
  Administered 2013-09-18 (×2): 2.5 mg via RESPIRATORY_TRACT
  Filled 2013-09-18: qty 0.5

## 2013-09-18 NOTE — Progress Notes (Signed)
Nursing Note: Pt called for breathing treatment and states she has tightness in her chest at this time.H/o asthma.A: Pt given albuterol neb that is ordered prn.T-97.8 p-67 r-13 BP- 120/71 Po2 99 % on r/a the patient has audible wheezes standing at the bedside.1610: Pt still c/o chest tightness and she is still wheezes as if she did not get a treatment hough she did.Paged resp. And in to see pt and they suggest that pt may need scheduled treatments.Dr.Swords here seeing pts and made aware and in to assess pt and saw her back incision.A: Chest X-ray ordered and labs and Resp. breathing treatment to be repeated now.Gave another treatment at [phone].Pt is coughing up very thick yellow secretions and has deep cough ,w /audible secretions rollling around  that pt is coughing hard to get up.Pt calmer but says her chest tightness is better but still present.Much calmer and of course head of bed elevated.wbb

## 2013-09-18 NOTE — Progress Notes (Signed)
1528 RN notified lab regarding Troponin X3 ordered this AM.  Lab denies seeing order entry.  RN notified MD on call to determine if labs should be redrawn and reordered.  RN reports patient is doing well and respiratory status is improved.  MD gave verbal order to hold troponin lab draws at this time.

## 2013-09-18 NOTE — Plan of Care (Signed)
Problem: RH SKIN INTEGRITY Goal: RH STG SKIN FREE OF INFECTION/BREAKDOWN No new skin breakdown or infection while in mod assist rehab  Outcome: Not Progressing Low back incision,moist w. White tissue at the center.sutures and stples to be removed in am.wbb

## 2013-09-18 NOTE — Progress Notes (Deleted)
Occupational Therapy Note  Patient Details  Name: MICHI HERRMANN MRN: [id number] Date of Birth: [birth year] Today's Date: 09/19/2013  Time::  7829-5621  Pain:Back    4/10  ;  Increased SOB with exertion Individual session  Pt. Reporting she is getting IV antibotics today and wants to take it easy.  Pt. Did agree to transfer from bed to wc with mod assist (lateral scoot transfer).  Pt. Bathed and dressed UB.  Did wc push up and OT bathed posterior peri area.  Sister, [name] assisted with set up.  Pt. Did grooming at sink with teeth, and [name] did hiar.  Pt SOB and anxious with movement.  Transferred wc to bed with mod assist and mod assist to get positioned with lifting legs in bed.   Remained in bed with call bell,phone within reach.      Humberto Seals 09/18/2013, 3:15 PM

## 2013-09-18 NOTE — Progress Notes (Signed)
1013 RN made aware of critical Potassium lab.  Dr. Posey Rea notified at 1015.  See notes for details.

## 2013-09-18 NOTE — Progress Notes (Signed)
1015 Dr. Posey Rea made aware of of critical lab value 2.5 result.  Telephone verbal orders given for labs tomorrow AM. PO Potassium increased.  See order management for details.

## 2013-09-18 NOTE — Progress Notes (Addendum)
  Subjective/Complaints: She has developed wheezing and chest tightness. This is fairly common for her. She continues to wheeze despite alb nebulizer.  She denies any real SOB and pulse ox on RA = 97-100%  She has some back pain that is unchanged   Objective: Vital Signs: Blood pressure 136/78, pulse 76, temperature 97.9 F (36.6 C), temperature source Oral, resp. rate 18, height 5\' 8"  (1.727 m), weight 240 lb 8.4 oz (109.1 kg), SpO2 100.00%. Physical Exam:    Well-developed well-nourished female in no acute distress. HEENT exam atraumatic, normocephalic, extraocular muscles are intact. Neck is supple. No jugular venous distention no thyromegaly. Chest with bilateral wheezing (resp rate = 13) without increased work of breathing. Cardiac exam S1 and S2 are regular. Abdominal exam active bowel sounds, soft, nontender. Extremities trace pretibial edema.  Wound- minimal serous drainage. No significant erythema   Assessment/Plan: 1. Functional deficits secondary to cauda equina syndrome which Cauda equina syndrome with B/B issues.  1. DVT Prophylaxis/Anticoagulation: Pharmaceutical: Lovenox  -dopplers negative 2. Chronic back pain/Pain Management: prn medcications effective.  3. H/o depression with anxiety/Mood: continue to Provide ego support. 4. Neuropsych: This patient is capable of making decisions on her own behalf.  5. HTN: On HCTZ and cozaar. Decreased cozaar to 25 and set parameters on BP meds to avoid orthostasis.  -continue to hold hctz  -TEDS, adequate fluids.  control/reassurance  -overall improved BP Readings from Last 3 Encounters:  09/18/13 136/78  09/08/13 114/69  09/08/13 114/69    6. ABLA: hgb 9.5, continue iron supplement.  7. Neurogenic Bowel and bladder: improving function -bowel meds--backing off regimen slightly -urecholine for bladder--dc'ed  8. Wound: increased  dressing changes to qid, increased cipro to 500mg  BID to allow skin coverage--continue for  now  -may shower if the wound is covered, water-tight (opsite)  9. Bronchospasm: she is already on decadron at fairly high dose.   It's unclear to me what the plan is for taper. We don't need to add additional steroids and may need to decrease.  Continue albuterol and add ipratropium(will schedule for now)  Will check CTA chest (? PE)   LOS (Days) 10 A FACE TO FACE EVALUATION WAS PERFORMED  Sherry Wong 09/18/2013 7:06 AM

## 2013-09-18 NOTE — Progress Notes (Signed)
Occupational Therapy Note  Patient Details  Name: Sherry Wong MRN: [id number] Date of Birth: 1956/06/24 Today's Date: 09/18/2013  Time:1500-1600  (60 min) Pain:  Back  4/10 and breathing difficulty; SOB with exertion Individual session   Pt. Lying in bed upon OT arrival.  Pt. Reported she was having a rough day adn wanted to do her exercises in bed.   She reported she is numb on lateral aspects of legs and feet are completely numb. Went over ankle movements with left foot dorsi flexiing about 10 degrees actively more than right.  Performed hip flexion, abduction on each leg.  Performed UE AROM  Emphasis on abduction/ external rotation to facilitate ease with donning shoes and socks.    Pt reported the exercises made her feel better.  Left pt in bed with call bell in place.  Sister, [name] present as well.        Humberto Seals 09/18/2013, 3:20 PM

## 2013-09-19 ENCOUNTER — Inpatient Hospital Stay (HOSPITAL_COMMUNITY): Payer: BC Managed Care – PPO | Admitting: Physical Therapy

## 2013-09-19 ENCOUNTER — Inpatient Hospital Stay (HOSPITAL_COMMUNITY): Payer: BC Managed Care – PPO | Admitting: *Deleted

## 2013-09-19 LAB — BASIC METABOLIC PANEL
BUN: 17 mg/dL (ref 6–23)
CO2: 27 mEq/L (ref 19–32)
Calcium: 9 mg/dL (ref 8.4–10.5)
Chloride: 104 mEq/L (ref 96–112)
Creatinine, Ser: 0.56 mg/dL (ref 0.50–1.10)
GFR calc Af Amer: >90 mL/min (ref >90)
GFR calc non Af Amer: >90 mL/min (ref >90)
Glucose, Bld: 98 mg/dL (ref 70–99)
Potassium: 4.1 mEq/L (ref 3.5–5.1)
Sodium: 139 mEq/L (ref 135–145)

## 2013-09-19 LAB — PRO B NATRIURETIC PEPTIDE: Pro B Natriuretic peptide (BNP): 265.4 pg/mL — ABNORMAL HIGH (ref 0–125)

## 2013-09-19 LAB — MAGNESIUM: Magnesium: 2.2 mg/dL (ref 1.5–2.5)

## 2013-09-19 MED ORDER — ALBUTEROL SULFATE (5 MG/ML) 0.5% IN NEBU
2.5000 mg | INHALATION_SOLUTION | Freq: Four times a day (QID) | RESPIRATORY_TRACT | Status: DC
  Administered 2013-09-19 – 2013-09-20 (×4): 2.5 mg via RESPIRATORY_TRACT
  Filled 2013-09-19 (×4): qty 0.5

## 2013-09-19 MED ORDER — LEVOFLOXACIN IN D5W 750 MG/150ML IV SOLN
750.0000 mg | INTRAVENOUS | Status: DC
  Administered 2013-09-19: 750 mg via INTRAVENOUS
  Filled 2013-09-19 (×2): qty 150

## 2013-09-19 MED ORDER — POTASSIUM CHLORIDE CRYS ER 20 MEQ PO TBCR
20.0000 meq | EXTENDED_RELEASE_TABLET | Freq: Every day | ORAL | Status: DC
  Administered 2013-09-20 – 2013-09-29 (×10): 20 meq via ORAL
  Filled 2013-09-19 (×11): qty 1

## 2013-09-19 MED ORDER — IPRATROPIUM BROMIDE 0.02 % IN SOLN
0.5000 mg | Freq: Four times a day (QID) | RESPIRATORY_TRACT | Status: DC
  Administered 2013-09-19 – 2013-09-20 (×4): 0.5 mg via RESPIRATORY_TRACT
  Filled 2013-09-19 (×4): qty 2.5

## 2013-09-19 MED ORDER — POTASSIUM CHLORIDE IN NACL 20-0.9 MEQ/L-% IV SOLN
INTRAVENOUS | Status: AC
  Administered 2013-09-19: 11:00:00 via INTRAVENOUS
  Filled 2013-09-19 (×2): qty 1000

## 2013-09-19 NOTE — Progress Notes (Signed)
6213 Patient with audible wheezing this AM Oxygen saturations 94%. Respiratory Therapy paged. Breathing treatment given. After breathing treatment patient coughing with audible wheezing. Productive cough with thick yellow mucous. adm

## 2013-09-19 NOTE — Progress Notes (Signed)
Occupational Therapy Note Patient Details  Name: Sherry Wong MRN: [id number] Date of Birth: 1955-12-18 Today's Date: 09/19/2013  Time:: 8295-6213  Pain:Back 4/10 ; Increased SOB with exertion  Individual session  Pt. Reporting she is getting IV antibotics today and wants to take it easy. Pt. Did agree to transfer from bed to wc with mod assist (lateral scoot transfer). Pt. Bathed and dressed UB. Did wc push up and OT bathed posterior peri area. Sister, [name] assisted with set up. Pt. Did grooming at sink with teeth, and [name] did hiar. Pt SOB and anxious with movement. Transferred wc to bed with mod assist and mod assist to get positioned with lifting legs in bed.  Remained in bed with call bell,phone within reach.      Humberto Seals 09/19/2013, 9:37 AM

## 2013-09-19 NOTE — Progress Notes (Signed)
  Subjective/Complaints: Continues to wheeze.  Has developed a productive cough (purulent sputum) No real SOB but chest feels tight  No other complaints   Objective: Vital Signs: Blood pressure 108/69, pulse 101, temperature 98.4 F (36.9 C), temperature source Oral, resp. rate 18, height 5\' 8"  (1.727 m), weight 240 lb 8.4 oz (109.1 kg), SpO2 98.00%. Physical Exam:    Well-developed well-nourished female in no acute distress. HEENT exam atraumatic, normocephalic, extraocular muscles are intact. Neck is supple. No jugular venous distention no thyromegaly. Chest with bilateral wheezing (resp rate = 13) without increased work of breathing. Cardiac exam S1 and S2 are regular. Abdominal exam active bowel sounds, soft, nontender. Extremities trace pretibial edema.  Wound- minimal serous drainage. No significant erythema  Reviewed labs  Note mildly elevated bnp  Reviewed CT from yesterday IMPRESSION:  1. No central pulmonary embolus. Subsegmental vessels not evaluated  due to bolus dispersion.  2. Bronchitis.  Assessment/Plan: 1. Functional deficits secondary to cauda equina syndrome which Cauda equina syndrome with B/B issues.  1. DVT Prophylaxis/Anticoagulation: Pharmaceutical: Lovenox  -dopplers negative 2. Chronic back pain/Pain Management: prn medcications effective.  3. H/o depression with anxiety/Mood: continue to Provide ego support. 4. Neuropsych: This patient is capable of making decisions on her own behalf.  5. HTN: On HCTZ and cozaar. Decreased cozaar to 25 and set parameters on BP meds to avoid orthostasis.  -continue to hold hctz  -TEDS, adequate fluids.  control/reassurance  -overall improved BP Readings from Last 3 Encounters:  09/19/13 108/69  09/08/13 114/69  09/08/13 114/69    6. ABLA: CBC:    Component Value Date/Time   WBC 5.8 09/18/2013 0811   HGB 10.1* 09/18/2013 0811   HCT 32.0* 09/18/2013 0811   PLT 233 09/18/2013 0811   MCV 93.3 09/18/2013 0811    NEUTROABS 7.9* 09/09/2013 0535   LYMPHSABS 1.3 09/09/2013 0535   MONOABS 0.6 09/09/2013 0535   EOSABS 0.0 09/09/2013 0535   BASOSABS 0.0 09/09/2013 0535    7. Neurogenic Bowel and bladder: improving function -bowel meds--backing off regimen slightly -urecholine for bladder--dc'ed  8. Wound: no signs of infection 9. Bronchospasm: she is already on decadron at fairly high dose.   It's unclear to me what the plan is for taper. We don't need to add additional steroids and may need to decrease.  Continue albuterol and add ipratropium(will schedule for now). Will start IVF  10. Bronchitis- will treat aggressively. Levaquin 11. Hypokalemia- ? Cause .replaced and resolved   LOS (Days) 11 A FACE TO FACE EVALUATION WAS PERFORMED  Laron Boorman HENRY 09/19/2013 11:04 AM

## 2013-09-19 NOTE — Progress Notes (Signed)
Physical Therapy Session Note  Patient Details  Name: Sherry Wong MRN: [id number] Date of Birth: 02/14/1956  Today's Date: 09/19/2013 Time: 0930-1005 Time Calculation (min): 35 min  Short Term Goals: Week 1:  PT Short Term Goal 1 (Week 1): Scoot pivot transfers with mod assist  PT Short Term Goal 1 - Progress (Week 1): Met PT Short Term Goal 2 (Week 1): wheelchair propulsion x 150' with supervision PT Short Term Goal 2 - Progress (Week 1): Met PT Short Term Goal 3 (Week 1): Sit <> stands with max assist.  PT Short Term Goal 3 - Progress (Week 1): Met Week 2:  PT Short Term Goal 1 (Week 2): Scoot transfers to/from level surfaces with supervision. PT Short Term Goal 2 (Week 2): Sit <> stand from wheelchair with moderate assistance PT Short Term Goal 3 (Week 2): Ambulation x 10' with RW and mod assist on consistant basis  Skilled Therapeutic Interventions/Progress Updates:   Pt still reporting SOB and feeling ill.  Pt requesting to stay in bed but perform therapy from bed level.  Performed bilat LE ROM and strengthening with focus on bilat ankle DF ROM, passive stretch, active DF/PF, glute sets, heel slides, hip flexion SLR, SAQ, hip ABD x 10 reps each exercise each LE with RLE requiring AAROM secondary to weakness and fatigue.  At end of session performed passive stretch on each gastroc/soleus and placed each LE into PRAFO boot for pressure relief and to maintain range.    Therapy Documentation Precautions:  Precautions Precautions: Back;Fall Precaution Booklet Issued: Yes (comment) Precaution Comments: Pt able to recall 2 of 3 Restrictions Weight Bearing Restrictions: No General: Amount of Missed PT Time (min): 25 Minutes Missed Time Reason: Patient ill (comment) (SOB and bronchitis) Vital Signs: Therapy Vitals Pulse Rate: 101 BP: 108/69 mmHg Patient Position, if appropriate: Lying Oxygen Therapy SpO2: 98 % O2 Device: None (Room air) Pain: Pain Assessment Pain  Assessment: 0-10 Pain Score: 4  Pain Type: Surgical pain Pain Location: Back Pain Orientation: Lower Pain Descriptors / Indicators: Aching Pain Onset: On-going Pain Intervention(s): Other (Comment) (premedicated)  See FIM for current functional status  Therapy/Group: Individual Therapy  [address] [facility] 09/19/2013, 10:08 AM

## 2013-09-20 ENCOUNTER — Inpatient Hospital Stay (HOSPITAL_COMMUNITY): Payer: BC Managed Care – PPO

## 2013-09-20 ENCOUNTER — Inpatient Hospital Stay (HOSPITAL_COMMUNITY): Payer: BC Managed Care – PPO | Admitting: Physical Therapy

## 2013-09-20 DIAGNOSIS — D62 Acute posthemorrhagic anemia: Secondary | ICD-10-CM | POA: Diagnosis present

## 2013-09-20 DIAGNOSIS — J209 Acute bronchitis, unspecified: Secondary | ICD-10-CM | POA: Diagnosis not present

## 2013-09-20 DIAGNOSIS — F3289 Other specified depressive episodes: Secondary | ICD-10-CM | POA: Diagnosis present

## 2013-09-20 DIAGNOSIS — E876 Hypokalemia: Secondary | ICD-10-CM | POA: Diagnosis present

## 2013-09-20 DIAGNOSIS — F329 Major depressive disorder, single episode, unspecified: Secondary | ICD-10-CM | POA: Diagnosis present

## 2013-09-20 MED ORDER — GUAIFENESIN-DM 100-10 MG/5ML PO SYRP
5.0000 mL | ORAL_SOLUTION | Freq: Three times a day (TID) | ORAL | Status: DC | PRN
  Administered 2013-09-21 (×2): 10 mL via ORAL
  Filled 2013-09-20 (×3): qty 10

## 2013-09-20 MED ORDER — IPRATROPIUM BROMIDE 0.02 % IN SOLN
0.5000 mg | Freq: Four times a day (QID) | RESPIRATORY_TRACT | Status: DC
  Administered 2013-09-20 – 2013-09-23 (×11): 0.5 mg via RESPIRATORY_TRACT
  Filled 2013-09-20 (×11): qty 2.5

## 2013-09-20 MED ORDER — LEVOFLOXACIN 750 MG PO TABS
750.0000 mg | ORAL_TABLET | Freq: Every day | ORAL | Status: AC
  Administered 2013-09-20 – 2013-09-27 (×8): 750 mg via ORAL
  Filled 2013-09-20 (×9): qty 1

## 2013-09-20 MED ORDER — ALBUTEROL SULFATE (5 MG/ML) 0.5% IN NEBU
2.5000 mg | INHALATION_SOLUTION | Freq: Four times a day (QID) | RESPIRATORY_TRACT | Status: DC
  Administered 2013-09-20 – 2013-09-23 (×11): 2.5 mg via RESPIRATORY_TRACT
  Filled 2013-09-20 (×11): qty 0.5

## 2013-09-20 MED ORDER — GUAIFENESIN ER 600 MG PO TB12
600.0000 mg | ORAL_TABLET | Freq: Two times a day (BID) | ORAL | Status: DC
  Administered 2013-09-20: 600 mg via ORAL
  Filled 2013-09-20 (×3): qty 1

## 2013-09-20 MED ORDER — DEXAMETHASONE 4 MG PO TABS
4.0000 mg | ORAL_TABLET | Freq: Two times a day (BID) | ORAL | Status: DC
  Administered 2013-09-20 – 2013-09-22 (×5): 4 mg via ORAL
  Filled 2013-09-20 (×7): qty 1

## 2013-09-20 MED ORDER — GUAIFENESIN ER 600 MG PO TB12
1200.0000 mg | ORAL_TABLET | Freq: Two times a day (BID) | ORAL | Status: DC
  Administered 2013-09-20 – 2013-09-29 (×18): 1200 mg via ORAL
  Filled 2013-09-20 (×20): qty 2

## 2013-09-20 NOTE — Progress Notes (Signed)
Subjective/Complaints: Cough and breathing better today.  A 12 point review of systems has been performed and if not noted above is otherwise negative.   Objective: Vital Signs: Blood pressure 126/83, pulse 85, temperature 98 F (36.7 C), temperature source Oral, resp. rate 18, height 5\' 8"  (1.727 m), weight 109.1 kg (240 lb 8.4 oz), SpO2 100.00%. Ct Angio Chest Pe W/cm &/or Wo Cm  09/18/2013   CLINICAL DATA:  Bilateral chest wheezing and chest tightness. No shortness of breath.  EXAM: CT ANGIOGRAPHY CHEST WITH CONTRAST  TECHNIQUE: Multidetector CT imaging of the chest was performed using the standard protocol during bolus administration of intravenous contrast. Multiplanar CT image reconstructions including MIPs were obtained to evaluate the vascular anatomy.  CONTRAST:  OMNIPAQUE IOHEXOL 350 MG/ML SOLN  COMPARISON:  Chest radiograph 09/18/2013.  FINDINGS: There is bolus dispersion present on the examination. This precludes evaluation of the subsegmental pulmonary arteries. Bovine aortic arch is present with variant vertebral artery origin on the left. No pericardial or pleural effusion. Incidental imaging of the upper abdomen shows cholecystectomy. Dropped clip is present interposed between the 2nd part of the duodenum in the liver. Partially visualized cystic lesion in left kidney likely represents a simple cyst but is incompletely characterized. Colonic diverticulosis is present.  Lung windows demonstrate basilar atelectasis. There are no aggressive osseous lesions. Thoracic vertebral body height preserved. Old left lateral and anterior rib fractures. Central airways are patent. Bronchial wall thickening is present in both lower lobes, compatible with chronic bronchitic changes. There are small areas of opacification of segmental bronchi again consistent with bronchitis.  Review of the MIP images confirms the above findings.  IMPRESSION: 1. No central pulmonary embolus. Subsegmental vessels  not evaluated due to bolus dispersion. 2. Bronchitis.   Electronically Signed   By: Andreas Newport M.D.   On: 09/18/2013 11:45    Recent Labs  09/18/13 0811  WBC 5.8  HGB 10.1*  HCT 32.0*  PLT 233    Recent Labs  09/18/13 0811 09/19/13 0735  NA 137 139  K 2.5* 4.1  CL 101 104  GLUCOSE 139* 98  BUN 21 17  CREATININE 0.65 0.56  CALCIUM 8.7 9.0   CBG (last 3)  No results found for this basename: GLUCAP,  in the last 72 hours  Wt Readings from Last 3 Encounters:  09/15/13 109.1 kg (240 lb 8.4 oz)  09/07/13 112 kg (246 lb 14.6 oz)  09/07/13 112 kg (246 lb 14.6 oz)    Physical Exam:  Constitutional: She is oriented to person, place, and time. She appears well-developed and well-nourished.  HENT: voice raspy Head: Normocephalic and atraumatic.  Eyes: Conjunctivae are normal. Pupils are equal, round, and reactive to light.  Neck: Neck supple.  Cardiovascular: Normal rate and regular rhythm.  Respiratory: Effort normal   No respiratory distress. She has a few wheezes and rhonchi GI: Soft. Bowel sounds are normal. She exhibits distension. There is no tenderness. There is no rebound.  Musculoskeletal: She exhibits trace edema.  Neurological: She is alert and oriented to person, place, and time.    2-2+/5 at HF, Right KE 1+ to 2, L knee 2 to 2+, right ankle 1, left ankle 2-. dtr's trace to absent  Skin: Skin is warm and dry.  Tiny area of drainage from back incision Psychiatric: She has a normal mood and affect. Her behavior is normal. Judgment and thought content normal.    Assessment/Plan: 1. Functional deficits secondary to cauda equina syndrome which require  3+ hours per day of interdisciplinary therapy in a comprehensive inpatient rehab setting. Physiatrist is providing close team supervision and 24 hour management of active medical problems listed below. Physiatrist and rehab team continue to assess barriers to discharge/monitor patient progress toward functional and  medical goals.    FIM: FIM - Bathing Bathing Steps Patient Completed: Chest;Right Arm;Left Arm;Abdomen;Front perineal area;Buttocks;Right upper leg;Left upper leg Bathing: 4: Min-Patient completes 8-9 62f 10 parts or 75+ percent  FIM - Upper Body Dressing/Undressing Upper body dressing/undressing steps patient completed: Thread/unthread right bra strap;Thread/unthread left bra strap;Thread/unthread right sleeve of pullover shirt/dresss;Thread/unthread left sleeve of pullover shirt/dress;Put head through opening of pull over shirt/dress;Pull shirt over trunk Upper body dressing/undressing: 6: More than reasonable amount of time FIM - Lower Body Dressing/Undressing Lower body dressing/undressing steps patient completed: Thread/unthread right pants leg;Thread/unthread left pants leg Lower body dressing/undressing: 1: Total-Patient completed less than 25% of tasks  FIM - Toileting Toileting steps completed by patient: Adjust clothing prior to toileting Toileting Assistive Devices: Grab bar or rail for support Toileting: 3: Mod-Patient completed 2 of 3 steps  FIM - Diplomatic Services operational officer Devices: Psychiatrist Transfers: 0-Activity did not occur  FIM - Banker Devices: Arm rests Bed/Chair Transfer: 5: Supine > Sit: Supervision (verbal cues/safety issues);4: Bed > Chair or W/C: Min A (steadying Pt. > 75%);4: Chair or W/C > Bed: Min A (steadying Pt. > 75%)  FIM - Locomotion: Wheelchair Distance: 60 Locomotion: Wheelchair: 0: Activity did not occur FIM - Locomotion: Ambulation Locomotion: Ambulation Assistive Devices: Designer, industrial/product Ambulation/Gait Assistance: Not tested (comment) Locomotion: Ambulation: 0: Activity did not occur  Comprehension Comprehension Mode: Auditory Comprehension: 7-Follows complex conversation/direction: With no assist  Expression Expression Mode: Verbal Expression: 7-Expresses complex  ideas: With no assist  Social Interaction Social Interaction: 6-Interacts appropriately with others with medication or extra time (anti-anxiety, antidepressant).  Problem Solving Problem Solving: 7-Solves complex problems: Recognizes & self-corrects  Memory Memory: 7-Complete Independence: No helper  Cauda equina syndrome with B/B issues.  1. DVT Prophylaxis/Anticoagulation: Pharmaceutical: Lovenox  -dopplers negative 2. Chronic back pain/Pain Management: prn medcications effective.  3. H/o depression with anxiety/Mood: continue to Provide ego support. 4. Neuropsych: This patient is capable of making decisions on her own behalf.  5. HTN: On HCTZ and cozaar. Decreased cozaar to 25 and set parameters on BP meds to avoid orthostasis.  -continue to hold hctz  -TEDS, adequate fluids. ?binder if needed, anxiety control/reassurance  -overall improved 6. ABLA: hgb 9.5, continue iron supplement.  7. Neurogenic Bowel and bladder: improving function -bowel meds--backing off regimen slightly  8. Wound: -improving  -can dc staples soon 9. Bronchitis--change to po levaquin. Add scheduled mucinex  -IS, oob LOS (Days) 12 A FACE TO FACE EVALUATION WAS PERFORMED  Jere Vanburen T 09/20/2013 8:22 AM

## 2013-09-20 NOTE — Progress Notes (Signed)
stable °

## 2013-09-20 NOTE — Progress Notes (Signed)
Occupational Therapy Session Note  Patient Details  Name: Sherry Wong MRN: [id number] Date of Birth: 1956/07/05  Today's Date: 09/20/2013 Time: 4098-1191 Time Calculation (min): 84 min  Short Term Goals: Week 2:  OT Short Term Goal 1 (Week 2): Patient will complete transfer from w/c to standard tub and tub bench with max assist to manage LE OT Short Term Goal 2 (Week 2): Patient will complete lower body bathing with min assist to maintain back precautions OT Short Term Goal 3 (Week 2): Patient will complete lower body dressing with min assist OT Short Term Goal 4 (Week 2): Patient will complete simple meal prep using LRAD with min assist OT Short Term Goal 5 (Week 2): Patient will demo ability to complete HEP for general strengthening independently  Skilled Therapeutic Interventions:  ADL-retraining with emphasis on improved endurance, skilled use of AD, adapted bathing of lower body (supine, with head of bed elevated), and improved performance of sit<>stand.  Patient completed bathing peri-area with only setup assist while she remained in bed.   She required max assist to don new diaper and assist with apply lotion to buttocks.   Patient completed bed mobility unassisted and transferred to w/c with only steadying assist (min).   At w/c level patient completed upper body bathing and dressing, requiring assist with sports bra.   During assist dressing patient was unable to rise from sit>stand at [facility] but improved to min assist using bed rail.   Repeated sit>stand revealed max A X 1 from w/c to RW, mod A X1 to bariatric RW, and min A X 1 using bed rail.  Patient admits to increased anxiety with use of standard RW due to fear related to her perception of poor stability provided by lightweight RW.  [name]     Therapy Documentation Precautions:  Precautions Precautions: Back;Fall Precaution Booklet Issued: Yes (comment) Precaution Comments: Pt able to recall 2 of 3 Restrictions Weight Bearing  Restrictions: No  Vital Signs: Therapy Vitals Pulse Rate: 85 Resp: 18 Oxygen Therapy SpO2: 100 % O2 Device: None (Room air)  Pain: Pain Assessment Pain Assessment: No/denies pain Pain Score: 5  Pain Type: Surgical pain Pain Location: Back Pain Orientation: Lower Pain Descriptors / Indicators: Aching Pain Frequency: Intermittent Pain Onset: On-going Patients Stated Pain Goal: 3 Pain Intervention(s): Medication (See eMAR)  ADL: ADL ADL Comments: see FIM  See FIM for current functional status  Therapy/Group: Individual Therapy  Second session: Time: 4782-9562 Time Calculation (min):  45 min  Pain Assessment: No pain  Skilled Therapeutic Interventions: Therex using NuStep, 2 sets of 10 minute sessions, with 1 rest break to recover.   Patient required extra time to set-up on NuStep d/t restricted AROM from using of AFO (removed after initial trial).   Patient progressed this session to levels 3 and 4 with exertion rated as 13 on BORG scale at level 4.   Patient transferred using squat (scoot) - pivot on/off NuStep and to bed from w/c with only steadying assist (min assist).  Patient husband arrived at end of session and confirmed new plan for discharge to patient's sister's home d/t installed ramp, widened doorways, and modified bathroom.  See FIM for current functional status  Therapy/Group: Individual Therapy  Sian Rockers 09/20/2013, 10:57 AM

## 2013-09-20 NOTE — Progress Notes (Signed)
Physical Therapy Session Note  Patient Details  Name: Sherry Wong MRN: [id number] Date of Birth: 04-Nov-1955  Today's Date: 09/20/2013 Time: 0830-0925 Time Calculation (min): 55 min   Skilled Therapeutic Interventions/Progress Updates:  1:1. Pt received semi-reclined in bed, alert and ready for therapy but requesting to stay in room/bed 2/2 fatigue despite encouragement. Focus this session on toileting, bed mobility and supine therex. Pt req (S) for t/f sup>sit and B rolling with use of hospital bed functions as well as scooting t/f bed<>w/c<>BSC w/ min cues for safety, but mod A for t/f sit>sup. Pt dependent for clean up. Supine therex performed for B LE strengthening/PROM. Exercises included 2x10reps of ankle pumps, quad sets, glute sets, SAQ, heel slides (increased assist for R LE vs. L  LE), hip abd, bridging and PROM to B gastroc/soleus . Pt semi-reclined in bed at end of session w/ all needs in reach, bed alarm on and RN in room.  Therapy Documentation Precautions:  Precautions Precautions: Back;Fall Precaution Booklet Issued: Yes (comment) Precaution Comments: Pt able to recall 2 of 3 Restrictions Weight Bearing Restrictions: No  See FIM for current functional status  Therapy/Group: Individual Therapy  Denzil Hughes 09/20/2013, 9:26 AM

## 2013-09-21 ENCOUNTER — Inpatient Hospital Stay (HOSPITAL_COMMUNITY): Payer: BC Managed Care – PPO

## 2013-09-21 ENCOUNTER — Inpatient Hospital Stay (HOSPITAL_COMMUNITY): Payer: BC Managed Care – PPO | Admitting: Physical Therapy

## 2013-09-21 DIAGNOSIS — S343XXA Injury of cauda equina, initial encounter: Secondary | ICD-10-CM

## 2013-09-21 DIAGNOSIS — K592 Neurogenic bowel, not elsewhere classified: Secondary | ICD-10-CM

## 2013-09-21 DIAGNOSIS — N319 Neuromuscular dysfunction of bladder, unspecified: Secondary | ICD-10-CM

## 2013-09-21 NOTE — Progress Notes (Signed)
Physical Therapy Session Note  Patient Details  Name: Sherry Wong MRN: [id number] Date of Birth: December 18, 1955  Today's Date: 09/21/2013 Time: 0730-0830 Time Calculation (min): 60 min  Skilled Therapeutic Interventions/Progress Updates:    Sit <> stands repeated with mod progressing to max assist with fatigue, bariatric RW for support. Scoot transfers at supervision/min-guard assist level. Wheelchair propulsion for endurance and strengthening 2 x 200' supervision, no rest breaks needed. Attempted forward foot taps in standing but pt too fatigued to remain standing then had difficulty with with sit>stand. Task switched to NuStep bil. LEs only 2 x 5 min level 5 cues to increase steps/min. SpO2 99%, HR up to 126, resting HR 101 bpm.   Second Session Time: 1130-1200  Time Calculation (min): 30 min Skilled Therapeutic Interventions/Progress Updates:  Pt having increased difficulty with cardiopulmonary status due to bronchitis - requested bed exercises. Discussed benefits of being OOB, pt agreed to sit in wheelchair for lunch. Supine bil.LE exercises: heel slides, resisted hip extension, hip abduction, straight leg raise (Rt. LE needs some assistance but improved overall) 2 x 10 reps. Heel cord bil. Stretch. Scoot transfers supervision. Practiced IS x 15 reps, pt strongly encouraged to perform on regular basis throughout the day.   Therapy Documentation Precautions:  Precautions Precautions: Back;Fall Precaution Booklet Issued: Yes (comment) Precaution Comments: Pt able to recall 3 of 3 Restrictions Weight Bearing Restrictions: No Pain: Pain Assessment Pain Assessment: No/denies pain Pain Score: 3   See FIM for current functional status  Therapy/Group: Individual Therapy both session  Wilhemina Bonito 09/21/2013, 12:05 PM

## 2013-09-21 NOTE — Progress Notes (Signed)
Occupational Therapy Session Note  Patient Details  Name: Sherry Wong MRN: [id number] Date of Birth: 05-03-1956  Today's Date: 09/21/2013 Time: 0930-1026 Time Calculation (min): 56 min  Short Term Goals: Week 2:  OT Short Term Goal 1 (Week 2): Patient will complete transfer from w/c to standard tub and tub bench with max assist to manage LE OT Short Term Goal 2 (Week 2): Patient will complete lower body bathing with min assist to maintain back precautions OT Short Term Goal 3 (Week 2): Patient will complete lower body dressing with min assist OT Short Term Goal 4 (Week 2): Patient will complete simple meal prep using LRAD with min assist OT Short Term Goal 5 (Week 2): Patient will demo ability to complete HEP for general strengthening independently  Skilled Therapeutic Interventions:  ADL-retraining with emphasis on improved clothing management during toileting, endurance, sit>stand, and adherence to back precautions.   Patient reported mild respiratory congestion at beginning of this session and deferred plan shower d/t general malaise.   Patient requested assist with and competed toileting at beginning of session but required use of Hunt[place] Dec Steady lift for transfer to toilet instead of using DA BSC due to inability to complete toilet hygiene independently using BSC.   While standing at [facility], patient completed 50% of clothing management before and after toileting, with good thoroughness during toilet hygiene.  Patient required extra time and more frequent breaks during bathing and dressing at sink, w/c level, due to fatigue and was assisted back to bed after ADL to recover in prep for physical therapy session to follow.      Therapy Documentation Precautions:  Precautions Precautions: Back;Fall Precaution Booklet Issued: Yes (comment) Precaution Comments: Pt able to recall 2 of 3 Restrictions Weight Bearing Restrictions: No  Vital Signs: Therapy Vitals Pulse Rate: 94 Resp:  20 Patient Position, if appropriate: Sitting Oxygen Therapy SpO2: 92 % O2 Device: None (Room air)  Pain: Pain Assessment Pain Assessment: No/denies pain Pain Score: 3  Pain Type: Surgical pain Pain Location: Back Pain Orientation: Lower;Mid Pain Descriptors / Indicators: Aching Pain Frequency: Intermittent Pain Onset: Gradual Patients Stated Pain Goal: 3 Pain Intervention(s): Medication (See eMAR)  ADL: ADL ADL Comments: see FIM  See FIM for current functional status  Therapy/Group: Individual Therapy  Nathaneal Sommers 09/21/2013, 10:29 AM

## 2013-09-21 NOTE — Progress Notes (Signed)
Subjective/Complaints: Voice feeling raspy. Still has cough but overall doing a bit better. Pain is controlled A 12 point review of systems has been performed and if not noted above is otherwise negative.   Objective: Vital Signs: Blood pressure 112/74, pulse 94, temperature 98.1 F (36.7 C), temperature source Oral, resp. rate 20, height 5\' 8"  (1.727 m), weight 109.1 kg (240 lb 8.4 oz), SpO2 92.00%. No results found. No results found for this basename: WBC, HGB, HCT, PLT,  in the last 72 hours  Recent Labs  09/19/13 0735  NA 139  K 4.1  CL 104  GLUCOSE 98  BUN 17  CREATININE 0.56  CALCIUM 9.0   CBG (last 3)  No results found for this basename: GLUCAP,  in the last 72 hours  Wt Readings from Last 3 Encounters:  09/15/13 109.1 kg (240 lb 8.4 oz)  09/07/13 112 kg (246 lb 14.6 oz)  09/07/13 112 kg (246 lb 14.6 oz)    Physical Exam:  Constitutional: She is oriented to person, place, and time. She appears well-developed and well-nourished.  HENT: voice raspy Head: Normocephalic and atraumatic.  Eyes: Conjunctivae are normal. Pupils are equal, round, and reactive to light.  Neck: Neck supple.  Cardiovascular: Normal rate and regular rhythm.  Respiratory: Effort normal   No respiratory distress. She has a few wheezes and rhonchi GI: Soft. Bowel sounds are normal. She exhibits distension. There is no tenderness. There is no rebound.  Musculoskeletal: She exhibits trace edema.  Neurological: She is alert and oriented to person, place, and time.    2-2+/5 at HF, Right KE 1+ to 2, L knee 2 to 2+, right ankle 1, left ankle 2-. dtr's trace to absent  Skin: Skin is warm and dry.  Tiny area of drainage from back incision is decreasing Psychiatric: She has a normal mood and affect. Her behavior is normal. Judgment and thought content normal.    Assessment/Plan: 1. Functional deficits secondary to cauda equina syndrome which require 3+ hours per day of interdisciplinary therapy in  a comprehensive inpatient rehab setting. Physiatrist is providing close team supervision and 24 hour management of active medical problems listed below. Physiatrist and rehab team continue to assess barriers to discharge/monitor patient progress toward functional and medical goals.  AFO's per Advanced  FIM: FIM - Bathing Bathing Steps Patient Completed: Chest;Right Arm;Left Arm;Abdomen;Front perineal area;Buttocks;Right upper leg;Left upper leg;Right lower leg (including foot);Left lower leg (including foot) Bathing: 6: Assistive device (Comment)  FIM - Upper Body Dressing/Undressing Upper body dressing/undressing steps patient completed: Thread/unthread right bra strap;Thread/unthread left bra strap;Thread/unthread right sleeve of pullover shirt/dresss;Thread/unthread left sleeve of pullover shirt/dress;Put head through opening of pull over shirt/dress;Pull shirt over trunk Upper body dressing/undressing: 4: Steadying assist FIM - Lower Body Dressing/Undressing Lower body dressing/undressing steps patient completed: Thread/unthread right pants leg;Thread/unthread left pants leg Lower body dressing/undressing: 3: Mod-Patient completed 50-74% of tasks  FIM - Toileting Toileting steps completed by patient: Adjust clothing prior to toileting Toileting Assistive Devices: Grab bar or rail for support Toileting: 3: Mod-Patient completed 2 of 3 steps  FIM - Diplomatic Services operational officer Devices: Psychiatrist Transfers: 5-From toilet/BSC: Supervision (verbal cues/safety issues);5-To toilet/BSC: Supervision (verbal cues/safety issues)  FIM - Bed/Chair Transfer Bed/Chair Transfer Assistive Devices: Bed rails;HOB elevated;Arm rests Bed/Chair Transfer: 5: Supine > Sit: Supervision (verbal cues/safety issues);5: Bed > Chair or W/C: Supervision (verbal cues/safety issues);5: Chair or W/C > Bed: Supervision (verbal cues/safety issues);3: Sit > Supine: Mod A (lifting assist/Pt.  50-74%/lift  2 legs)  FIM - Locomotion: Wheelchair Distance: 60 Locomotion: Wheelchair: 0: Activity did not occur FIM - Locomotion: Ambulation Locomotion: Ambulation Assistive Devices: Designer, industrial/product Ambulation/Gait Assistance: Not tested (comment) Locomotion: Ambulation: 0: Activity did not occur  Comprehension Comprehension Mode: Auditory Comprehension: 7-Follows complex conversation/direction: With no assist  Expression Expression Mode: Verbal Expression: 7-Expresses complex ideas: With no assist  Social Interaction Social Interaction: 6-Interacts appropriately with others with medication or extra time (anti-anxiety, antidepressant).  Problem Solving Problem Solving: 7-Solves complex problems: Recognizes & self-corrects  Memory Memory: 7-Complete Independence: No helper  Cauda equina syndrome with B/B issues.  1. DVT Prophylaxis/Anticoagulation: Pharmaceutical: Lovenox  -dopplers negative 2. Chronic back pain/Pain Management: prn medcications effective.  3. H/o depression with anxiety/Mood: continue to Provide ego support. 4. Neuropsych: This patient is capable of making decisions on her own behalf.  5. HTN: On HCTZ and cozaar. Decreased cozaar to 25 and set parameters on BP meds to avoid orthostasis.  -continue to hold hctz  -TEDS, adequate fluids. ?binder if needed, anxiety control/reassurance  -overall improved 6. ABLA: hgb 9.5, continue iron supplement.  7. Neurogenic Bowel and bladder: improving function -bowel meds--backing off regimen slightly  8. Wound: -improving  -can dc staples when wound is dry 9. Bronchitis--change to po levaquin. Added scheduled mucinex. Slowly improving.   -encouraging fluids  -IS, oob LOS (Days) 13 A FACE TO FACE EVALUATION WAS PERFORMED  SWARTZ,ZACHARY T 09/21/2013 9:14 AM

## 2013-09-21 NOTE — Progress Notes (Signed)
Social Work Patient ID: Sherry Wong, female   DOB: [birth year], 57 y.o.   MRN: [id number]  CSW met with pt and learned that she is going home with her sister whose home is handicap accessible and she will be with her 24/7.  Pt's husband will also come over to help/visit, as they only live 15 minutes apart.  Pt plans to stay there for about a month.  Pt's family is installing and paying for a ramp at pt's home and she is thrilled about this.  Pt has bronchitis and this has made her feel weaker, but pt is positive and upbeat and can see progress in her rehabilitation and she is encouraged.  CSW explained that when we know equipment and follow up therapy needs, we will get those things arranged for her.  She was accepting.  No other concerns/needs noted at this time.

## 2013-09-21 NOTE — Progress Notes (Signed)
Physical Therapy Session Note  Patient Details  Name: Sherry Wong MRN: [id number] Date of Birth: 1956-07-28  Today's Date: 09/21/2013 Time: 7829-5621 Time Calculation (min): 45 min  Short Term Goals: Week 2:  PT Short Term Goal 1 (Week 2): Scoot transfers to/from level surfaces with supervision. PT Short Term Goal 2 (Week 2): Sit <> stand from wheelchair with moderate assistance PT Short Term Goal 3 (Week 2): Ambulation x 10' with RW and mod assist on consistant basis  Skilled Therapeutic Interventions/Progress Updates:    Pt received semi-reclined in bed; agreeable to therapy. Session focused on initiating therapeutic exercises in bed to improve bilat LE strength/ROM. Therapist provided explanation, demonstration, and paper handout to facilitate pt carryover of the following therex: bilat ankle plantarflexion, dorsiflexion, inversion, and eversion AROM x10 reps per side; toe circles x20 reps per side; hip abduction/adduction AROM x15 reps per side. L heel slides x20 reps. R heel slides modified to R hip/knee flexion/extension AAROM using sheet to assist secondary to pt difficulty performing AROM on RLE. Pt required brief (~30-second) rest breaks following sets of RLE therex. Pt performed supine>sit with HOB elevated using rail with supervision. Pt then performed lateral scoot transfer from bed>w/c with supervision. Therapist departed with pt seated in w/c with all needs within reach.  Therapy Documentation Precautions:  Precautions Precautions: Back;Fall Precaution Booklet Issued: Yes (comment) Precaution Comments: Pt able to recall 2 of 3 Restrictions Weight Bearing Restrictions: No Vital Signs: Therapy Vitals Pulse Rate: 103 Resp: 20 Patient Position, if appropriate: Sitting Oxygen Therapy SpO2: 98 % O2 Device: None (Room air) Pain: Pain Assessment Pain Assessment: No/denies pain Pain Score: 0-No pain  Therapy/Group: Individual Therapy  Hobble, Lorenda Ishihara 09/21/2013, 5:13  PM

## 2013-09-22 ENCOUNTER — Inpatient Hospital Stay (HOSPITAL_COMMUNITY): Payer: BC Managed Care – PPO | Admitting: Physical Therapy

## 2013-09-22 ENCOUNTER — Inpatient Hospital Stay (HOSPITAL_COMMUNITY): Payer: BC Managed Care – PPO

## 2013-09-22 MED ORDER — DEXAMETHASONE 4 MG PO TABS
4.0000 mg | ORAL_TABLET | Freq: Three times a day (TID) | ORAL | Status: DC
  Administered 2013-09-23 – 2013-09-26 (×10): 4 mg via ORAL
  Filled 2013-09-22 (×14): qty 1

## 2013-09-22 MED ORDER — ALBUTEROL SULFATE (5 MG/ML) 0.5% IN NEBU: 2.5000 mg | INHALATION_SOLUTION | Freq: Three times a day (TID) | RESPIRATORY_TRACT | Status: DC

## 2013-09-22 MED ORDER — PREDNISONE 20 MG PO TABS
20.0000 mg | ORAL_TABLET | Freq: Two times a day (BID) | ORAL | Status: DC
  Administered 2013-09-22: 20 mg via ORAL
  Filled 2013-09-22 (×3): qty 1

## 2013-09-22 NOTE — Progress Notes (Signed)
Occupational Therapy Session and Weekly Progress Note  Patient Details  Name: MEHA VIDRINE MRN: [id number] Date of Birth: 27-Mar-1956  Today's Date: 09/22/2013 Time: 4098-1191 Time Calculation (min): 61 min  Patient has met 1 of 5 short term goals.  Patient progressing toward all goals with delays in achievement d/t to mild upper respiratory congestion (addressed by MD) resulting in increased fatigue.  Patient continues to demonstrate the following deficits: BUE weakness, impaired dynamic standing balance and limited endurance and therefore will continue to benefit from skilled OT intervention to enhance overall performance with BADL and iADL.  Patient progressing toward long term goals..  Continue plan of care.  OT Short Term Goals Week 2:  OT Short Term Goal 1 (Week 2): Patient will complete transfer from w/c to standard tub and tub bench with max assist to manage LE OT Short Term Goal 1 - Progress (Week 2): Progressing toward goal OT Short Term Goal 2 (Week 2): Patient will complete lower body bathing with min assist to maintain back precautions OT Short Term Goal 2 - Progress (Week 2): Met OT Short Term Goal 3 (Week 2): Patient will complete lower body dressing with min assist OT Short Term Goal 3 - Progress (Week 2): Progressing toward goal OT Short Term Goal 4 (Week 2): Patient will complete simple meal prep using LRAD with min assist OT Short Term Goal 4 - Progress (Week 2): Progressing toward goal OT Short Term Goal 5 (Week 2): Patient will demo ability to complete HEP for general strengthening independently OT Short Term Goal 5 - Progress (Week 2): Progressing toward goal Week 3:  OT Short Term Goal 1 (Week 3): Patient will complete transfer from w/c to standard tub and tub bench with max assist to manage LE OT Short Term Goal 2 (Week 3): Patient will complete lower body dressing with min assist OT Short Term Goal 3 (Week 3): Patient will complete simple meal prep using LRAD  with min assist OT Short Term Goal 4 (Week 3): Patient will demo ability to complete HEP for general strengthening independently  Skilled Therapeutic Interventions:  ADL-retraining with emphasis on tub bench transfers, improved supported tanding balance, and lower body dressing skills.   Patient completed tub bench transfers from w/c unassisted after initial assist with w/c positioning due to architectural boundaries.   She remains independent with seated grooming and upper body dressing but continues to require physical assist with pulling up her pants and managing LE orthotics.   Patient was re-educated on use of standard RW with addd weights added to decrease her perception of instability with standard device.    Therapy Documentation Precautions:  Precautions Precautions: Back;Fall Precaution Booklet Issued: Yes (comment) Precaution Comments: Pt able to recall 2 of 3 Restrictions Weight Bearing Restrictions: No  Vital Signs: Oxygen Therapy SpO2: 98 % O2 Device: None (Room air)  Pain: Pain Assessment Pain Assessment: 0-10 Pain Score: 5  Pain Type: Surgical pain Pain Location: Back Pain Orientation: Lower  ADL: ADL ADL Comments: see FIM  See FIM for current functional status  Therapy/Group: Individual Therapy  Rishith Siddoway 09/22/2013, 10:32 AM

## 2013-09-22 NOTE — Progress Notes (Signed)
Physical Therapy Session Note  Patient Details  Name: Sherry Wong MRN: [id number] Date of Birth: 1955/11/23  Today's Date: 09/22/2013 Time: 1400-1445 Time Calculation (min): 45 min  Skilled Therapeutic Interventions/Progress Updates:    Pt received seated in w/c. Pt propelled w/c to therapy gym supervision. Performed sit<>stand from w/c at high low table x5. First two trials provided manual facilitation and verbal cues for hip extension and upright trunk. Pt demonstrated significant weight bearing through BUE during standing. Pt reported feeling more secure standing at table vs with RW. Attempted standing with single UE support but pt became anxious and was unable to maintain standing. Transitioned to pt quickly tapping checker board on table x2 each UE. Final trial pt moved checker piece on board x1 each UE, able to maintain single UE support with RUE longer than LUE. Provided pt with 20"x18" w/c vs 20"x16" w/c. Pt transferred w/c<>mat supervision. Pt reported increased comfort and preference for 20"x18" w/c. Pt propelled w/c to room, pt left seated in w/c with all needs in reach.   Therapy Documentation Precautions:  Precautions Precautions: Back;Fall Precaution Booklet Issued: Yes (comment) Precaution Comments: Pt able to recall 2 of 3 Restrictions Weight Bearing Restrictions: No Pain:  Pt reports 6/10 surgical pain in back, RN provided medication.   See FIM for current functional status  Therapy/Group: Individual Therapy  Patrich Heinze 09/22/2013, 3:14 PM

## 2013-09-22 NOTE — Progress Notes (Signed)
Subjective/Complaints: Continued cough, having more wheezing and respiratory issues, occasionally sob A 12 point review of systems has been performed and if not noted above is otherwise negative.   Objective: Vital Signs: Blood pressure 124/81, pulse 69, temperature 98.1 F (36.7 C), temperature source Oral, resp. rate 19, height 5\' 8"  (1.727 m), weight 109.1 kg (240 lb 8.4 oz), SpO2 98.00%. No results found. No results found for this basename: WBC, HGB, HCT, PLT,  in the last 72 hours No results found for this basename: NA, K, CL, CO, GLUCOSE, BUN, CREATININE, CALCIUM,  in the last 72 hours CBG (last 3)  No results found for this basename: GLUCAP,  in the last 72 hours  Wt Readings from Last 3 Encounters:  09/15/13 109.1 kg (240 lb 8.4 oz)  09/07/13 112 kg (246 lb 14.6 oz)  09/07/13 112 kg (246 lb 14.6 oz)    Physical Exam:  Constitutional: She is oriented to person, place, and time. She appears well-developed and well-nourished.  HENT: voice raspy Head: Normocephalic and atraumatic.  Eyes: Conjunctivae are normal. Pupils are equal, round, and reactive to light.  Neck: Neck supple.  Cardiovascular: Normal rate and regular rhythm.  Respiratory: Effort normal   No respiratory distress. She has a few wheezes and rhonchi GI: Soft. Bowel sounds are normal. She exhibits distension. There is no tenderness. There is no rebound.  Musculoskeletal: She exhibits trace edema.  Neurological: She is alert and oriented to person, place, and time.    2-2+/5 at HF, Right KE 1+ to 2, L knee 2 to 2+, right ankle 1, left ankle 2-. dtr's trace to absent  Skin: Skin is warm and dry.  Tiny area of drainage from back incision is decreasing Psychiatric: She has a normal mood and affect. Her behavior is normal. Judgment and thought content normal.    Assessment/Plan: 1. Functional deficits secondary to cauda equina syndrome which require 3+ hours per day of interdisciplinary therapy in a  comprehensive inpatient rehab setting. Physiatrist is providing close team supervision and 24 hour management of active medical problems listed below. Physiatrist and rehab team continue to assess barriers to discharge/monitor patient progress toward functional and medical goals.     FIM: FIM - Bathing Bathing Steps Patient Completed: Chest;Right Arm;Left Arm;Abdomen;Front perineal area Bathing: 4: Min-Patient completes 8-9 47f 10 parts or 75+ percent  FIM - Upper Body Dressing/Undressing Upper body dressing/undressing steps patient completed: Thread/unthread right bra strap;Thread/unthread left bra strap;Thread/unthread right sleeve of pullover shirt/dresss;Thread/unthread left sleeve of pullover shirt/dress;Put head through opening of pull over shirt/dress Upper body dressing/undressing: 5: Supervision: Safety issues/verbal cues FIM - Lower Body Dressing/Undressing Lower body dressing/undressing steps patient completed: Thread/unthread right pants leg;Thread/unthread left pants leg Lower body dressing/undressing: 2: Max-Patient completed 25-49% of tasks  FIM - Toileting Toileting steps completed by patient: Adjust clothing prior to toileting;Performs perineal hygiene Toileting Assistive Devices: Grab bar or rail for support Toileting: 3: Mod-Patient completed 2 of 3 steps  FIM - Diplomatic Services operational officer Devices: Psychiatrist Transfers: 1-Mechanical lift [place] Dec Steady)  FIM - Press photographer Assistive Devices: Bed rails;HOB elevated;Arm rests Bed/Chair Transfer: 5: Supine > Sit: Supervision (verbal cues/safety issues);5: Bed > Chair or W/C: Supervision (verbal cues/safety issues)  FIM - Locomotion: Wheelchair Distance: 60 Locomotion: Wheelchair: 5: Travels 150 ft or more: maneuvers on rugs and over door sills with supervision, cueing or coaxing FIM - Locomotion: Ambulation Locomotion: Ambulation Assistive Devices: Dealer Ambulation/Gait Assistance: Not tested (comment) Locomotion:  Ambulation: 0: Activity did not occur (unable today due to weakness)  Comprehension Comprehension Mode: Auditory Comprehension: 7-Follows complex conversation/direction: With no assist  Expression Expression Mode: Verbal Expression: 7-Expresses complex ideas: With no assist  Social Interaction Social Interaction: 6-Interacts appropriately with others with medication or extra time (anti-anxiety, antidepressant).  Problem Solving Problem Solving: 7-Solves complex problems: Recognizes & self-corrects  Memory Memory: 7-Complete Independence: No helper  Cauda equina syndrome with B/B issues.  1. DVT Prophylaxis/Anticoagulation: Pharmaceutical: Lovenox  -dopplers negative 2. Chronic back pain/Pain Management: prn medcications effective.  3. H/o depression with anxiety/Mood: continue to Provide ego support. 4. Neuropsych: This patient is capable of making decisions on her own behalf.  5. HTN: On HCTZ and cozaar. Decreased cozaar to 25 and set parameters on BP meds to avoid orthostasis.  -continue to hold hctz  -TEDS, adequate fluids. ?binder if needed, anxiety control/reassurance  -overall improved 6. ABLA: hgb 9.5, continue iron supplement.  7. Neurogenic Bowel and bladder: improving function -bowel meds--backing off regimen slightly  8. Wound: -improving   -continue current dressing 9. Bronchitis--change to po levaquin. Increased mucinex to 1200mg  bid. Slowly improving.   -encouraging fluids  -IS, oob  -albuterol neb, prednisone, LOS (Days) 14 A FACE TO FACE EVALUATION WAS PERFORMED  SWARTZ,ZACHARY T 09/22/2013 8:38 AM

## 2013-09-22 NOTE — Progress Notes (Signed)
Better. A little short of breath. On prednisone

## 2013-09-22 NOTE — Progress Notes (Signed)
Social Work Patient ID: Sherry Wong, female   DOB: 05-26-1956, 57 y.o.   MRN: [id number]  CSW met with pt to update her on team conference discussion.  Team feels pt will still be ready for d/c on 09-29-13.  Some of her goals were downgraded to w/c level goals, but PT wants to still work on ambulation with her.  Pt had spoken with PT about this, as well.  CSW spoke with pt's sister, Kendal Hymen, as this is were pt will go at d/c.  She and pt's husband, Benay Pike, will come on Monday, December 29 at 10 am.  Pt is pleased she is making progress and is excited about going home next week.

## 2013-09-22 NOTE — Patient Care Conference (Signed)
Inpatient RehabilitationTeam Conference and Plan of Care Update Date: 09/21/2013   Time: 2:20 PM    Patient Name: Sherry Wong      Medical Record Number: [id number]  Date of Birth: Jan 21, 1956 Sex: Female         Room/Bed: 4W02C/4W02C-01 Payor Info: Payor: BLUE [employer] BLUE [employer]IELD / Plan: BCBS PPO OUT OF STATE / Product Type: *No Product type* /    Admitting Diagnosis: CAUDA EQUINA SYNDROME  Admit Date/Time:  09/08/2013  4:00 PM Admission Comments: No comment available   Primary Diagnosis:  <principal problem not specified> Principal Problem: <principal problem not specified>  Patient Active Problem List   Diagnosis Date Noted  . Hypokalemia 09/20/2013  . Depressive disorder, not elsewhere classified 09/20/2013  . Bronchitis, chronic with acute exacerbation 09/20/2013  . Acute blood loss anemia 09/20/2013  . Cauda equina syndrome 09/08/2013  . Morbid obesity 08/26/2013  . HTN (hypertension) 08/26/2013  . Hypoxemia 08/26/2013  . Unspecified hypothyroidism 08/26/2013  . Lumbar degenerative disc disease 08/25/2013    Expected Discharge Date: Expected Discharge Date: 09/29/13  Team Members Present: Physician leading conference: Dr. Faith Rogue Social Worker Present: Amada Jupiter, LCSW Nurse Present: Leland Johns, RN PT Present: Karolee Stamps, Jerrye Bushy, PT OT Present: Edwin Cap, OT;Kris Gellert, OT;Frank Wellston, OT SLP Present: Feliberto Gottron, SLP PPS Coordinator present : Tora Duck, RN, CRRN;Becky Henrene Dodge, PT     Current Status/Progress Goal Weekly Team Focus  Medical   orthostasis better. acute bronchitis---improving. pain control better         Bowel/Bladder   Daily bowel program ( suppository  6pm), continent of bladder  Manage bowel program and continent of bladder      Swallow/Nutrition/ Hydration             ADL's   Min A squat-pivot transfers (bed/tub bench, DA BSC), Mod - Max for LB ADL, Supervision for UB ADL  Mod I for ADL and transfers,  Supervision for homemaking  General endurance and strengthening, LB ADL, transfers, sit>stand, dynamic sitting/standing balance, back precautions   Mobility   Supervision scoot transfers, mod/max assist stand pivot, mod/max assist sit <> stand, min/mod assist ambulation  Mod I wheelchair level, min assist short distance gait  Transfers, standing balance/endurance, ambulation, increased independence   Communication             Safety/Cognition/ Behavioral Observations            Pain   Pain managed with 2 percocet q 4 prn  Pain less than or equal to 3  Monitor   Skin   Incision to back sutures removed, steri strips and dry dressing  Healed incision without infection         *See Care Plan and progress notes for long and short-term goals.  Barriers to Discharge: ongoing weakness    Possible Resolutions to Barriers:  wheelchair use, equipment education    Discharge Planning/Teaching Needs:  Pt plans to go to her sister's home for about a month, where she will have better wheelchair accessibility.  Family will participate in family education prior to d/c.   Team Discussion:  Pt has received leg braces.  She is still very weak and some goals have been downgraded with focusing more on w/c level goals.  She is at supervision level with wheelchair transfers.  Pt's wound is improved and her staples are out.  Family will need to participate in family education next Monday or Tuesday.   Revisions to Treatment Plan:  Some goals have been downgraded to more wheelchair level goals.   Continued Need for Acute Rehabilitation Level of Care: The patient requires daily medical management by a physician with specialized training in physical medicine and rehabilitation for the following conditions: Daily direction of a multidisciplinary physical rehabilitation program to ensure safe treatment while eliciting the highest outcome that is of practical value to the patient.: Yes Daily medical management of  patient stability for increased activity during participation in an intensive rehabilitation regime.: Yes Daily analysis of laboratory values and/or radiology reports with any subsequent need for medication adjustment of medical intervention for : Post surgical problems;Neurological problems  Sherry Wong, Sherry Wong 09/22/2013, 9:41 AM

## 2013-09-22 NOTE — Progress Notes (Signed)
I was present during this session, agree with the documentation provided. Sherrine Maples PT, DPT

## 2013-09-22 NOTE — Progress Notes (Signed)
Physical Therapy Weekly Progress Note  Patient Details  Name: Sherry Wong MRN: [id number] Date of Birth: 07/05/1956  Today's Date: 09/22/2013 Time: 0730-0830 Time Calculation (min): 60 min  Patient has met 2 of 3 short term goals.  Pt did not meet goal of ambulating 10' on a consistent basis. Pt has been limited physically this week due to an upper respiratory illness in addition to a mental block from anxiety/fear of falling.   Patient continues to demonstrate the following deficits: significant bil. LE paresis and weakness, generalized deconditioning, decreased endurance (particularly standing endurance) and therefore will continue to benefit from skilled PT intervention to enhance overall performance with activity tolerance, balance, postural control, ability to compensate for deficits, functional use of  right lower extremity and left lower extremity and knowledge of precautions. Pt's progression is beginning to point more towards wheelchair level and less towards an ambulatory level for D/C home.   Patient progressing toward long term goals. Ambulation goals were downgraded this week.  Continue plan of care.  PT Short Term Goals Week 2:  PT Short Term Goal 1 (Week 2): Scoot transfers to/from level surfaces with supervision. PT Short Term Goal 1 - Progress (Week 2): Met PT Short Term Goal 2 (Week 2): Sit <> stand from wheelchair with moderate assistance PT Short Term Goal 2 - Progress (Week 2): Met PT Short Term Goal 3 (Week 2): Ambulation x 10' with RW and mod assist on consistant basis PT Short Term Goal 3 - Progress (Week 2): Not met  Skilled Therapeutic Interventions/Progress Updates:    Session focused on standing endurance and balance. Standing balance while being cleaned from BM progressing to forward foot taps (pt only able to perform 2-4 at a time before needing seated rest break). Ambulation x 2', 5' with bariatric RW and mod assist, PT needed to assist with bil.LE  advancement and placement. Pt practiced removing one hand off RW. Pt is very anxious with all standing activity, PT promoted use of visualization with good success to reduce anxiety. HR up to 128-129 with activity.   All sit <> stands heavy mod assist today, PT facilitating anterior translation over feet during transition.   Second Session Time:  1130-1200 Time Calculation (min): 30 min Skilled Therapeutic Interventions/Progress Updates:  Ambulation x ~4',3' with bariatric RW, mod assist PT to advance RW and wheelchair close to follow. Standing balance/endurance with one UE support (very difficult, pt anxious with task), again noted improvements with visualization prior to performance of task. HR up to 129 with activity, back to 104 with seated rest.   Therapy Documentation Precautions:  Precautions Precautions: Back;Fall Precaution Booklet Issued: Yes (comment) Precaution Comments: Pt able to recall 2 of 3 Restrictions Weight Bearing Restrictions: No Pain: Pain not rated but greatest during standing, pt reports stinging down Lt. Side. Present during both sessions.   See FIM for current functional status  Therapy/Group: Individual Therapy both sessions.  [name] 09/22/2013, 12:17 PM

## 2013-09-23 MED ORDER — IPRATROPIUM BROMIDE 0.02 % IN SOLN
0.5000 mg | Freq: Three times a day (TID) | RESPIRATORY_TRACT | Status: DC
  Administered 2013-09-23 – 2013-09-24 (×5): 0.5 mg via RESPIRATORY_TRACT
  Filled 2013-09-23 (×6): qty 2.5

## 2013-09-23 MED ORDER — ALBUTEROL SULFATE (5 MG/ML) 0.5% IN NEBU
2.5000 mg | INHALATION_SOLUTION | Freq: Three times a day (TID) | RESPIRATORY_TRACT | Status: DC
  Administered 2013-09-23 – 2013-09-24 (×5): 2.5 mg via RESPIRATORY_TRACT
  Filled 2013-09-23 (×6): qty 0.5

## 2013-09-23 NOTE — Progress Notes (Signed)
Nursing Note: Pt wheezing and requested HHN tx.A: Given.wbb

## 2013-09-23 NOTE — Progress Notes (Signed)
Patient ID: CIGI BEGA, female   DOB: [birth year], 57 y.o.   MRN: [id number] Breathing better. Home next week

## 2013-09-23 NOTE — Progress Notes (Signed)
Subjective/Complaints: Feeling better today. Wheezing less, voice stronger. Slept well last night A 12 point review of systems has been performed and if not noted above is otherwise negative.   Objective: Vital Signs: Blood pressure 108/70, pulse 116, temperature 98 F (36.7 C), temperature source Oral, resp. rate 17, height 5\' 8"  (1.727 m), weight 108.591 kg (239 lb 6.4 oz), SpO2 97.00%. No results found. No results found for this basename: WBC, HGB, HCT, PLT,  in the last 72 hours No results found for this basename: NA, K, CL, CO, GLUCOSE, BUN, CREATININE, CALCIUM,  in the last 72 hours CBG (last 3)  No results found for this basename: GLUCAP,  in the last 72 hours  Wt Readings from Last 3 Encounters:  09/22/13 108.591 kg (239 lb 6.4 oz)  09/07/13 112 kg (246 lb 14.6 oz)  09/07/13 112 kg (246 lb 14.6 oz)    Physical Exam:  Constitutional: She is oriented to person, place, and time. She appears well-developed and well-nourished.  HENT: voice raspy Head: Normocephalic and atraumatic.  Eyes: Conjunctivae are normal. Pupils are equal, round, and reactive to light.  Neck: Neck supple.  Cardiovascular: Normal rate and regular rhythm.  Respiratory: Effort normal   No respiratory distress. She has a few wheezes and rhonchi GI: Soft. Bowel sounds are normal. She exhibits distension. There is no tenderness. There is no rebound.  Musculoskeletal: She exhibits trace edema.  Neurological: She is alert and oriented to person, place, and time.    2-2+/5 at HF, Right KE 1+ to 2, L knee 2 to 2+, right ankle 1, left ankle 2-. dtr's trace to absent  Skin: Skin is warm and dry.  Tiny area of drainage from back incision is decreasing Psychiatric: She has a normal mood and affect. Her behavior is normal. Judgment and thought content normal.    Assessment/Plan: 1. Functional deficits secondary to cauda equina syndrome which require 3+ hours per day of interdisciplinary therapy in a comprehensive  inpatient rehab setting. Physiatrist is providing close team supervision and 24 hour management of active medical problems listed below. Physiatrist and rehab team continue to assess barriers to discharge/monitor patient progress toward functional and medical goals.     FIM: FIM - Bathing Bathing Steps Patient Completed: Chest;Right Arm;Left Arm;Abdomen;Front perineal area;Right upper leg;Left upper leg;Left lower leg (including foot);Right lower leg (including foot);Buttocks Bathing: 6: More than reasonable amount of time (lateral leans for buttocks)  FIM - Upper Body Dressing/Undressing Upper body dressing/undressing steps patient completed: Thread/unthread right bra strap;Thread/unthread left bra strap;Hook/unhook bra;Thread/unthread right sleeve of pullover shirt/dresss;Thread/unthread left sleeve of pullover shirt/dress;Put head through opening of pull over shirt/dress;Pull shirt over trunk Upper body dressing/undressing: 7: Complete Independence: No helper FIM - Lower Body Dressing/Undressing Lower body dressing/undressing steps patient completed: Thread/unthread right pants leg;Thread/unthread left pants leg Lower body dressing/undressing: 1: Total-Patient completed less than 25% of tasks  FIM - Toileting Toileting steps completed by patient: Adjust clothing prior to toileting;Performs perineal hygiene Toileting Assistive Devices: Grab bar or rail for support Toileting: 3: Mod-Patient completed 2 of 3 steps  FIM - Diplomatic Services operational officer Devices: Psychiatrist Transfers: 5-To toilet/BSC: Supervision (verbal cues/safety issues);3-From toilet/BSC: Mod A (lift or lower assist)  FIM - Bed/Chair Transfer Bed/Chair Transfer Assistive Devices: Bed rails;HOB elevated;Arm rests Bed/Chair Transfer: 5: Bed > Chair or W/C: Supervision (verbal cues/safety issues);5: Chair or W/C > Bed: Supervision (verbal cues/safety issues)  FIM - Locomotion:  Wheelchair Distance: 180 Locomotion: Wheelchair: 5: Travels 150 ft  or more: maneuvers on rugs and over door sills with supervision, cueing or coaxing FIM - Locomotion: Ambulation Locomotion: Ambulation Assistive Devices: Walker - Rolling Ambulation/Gait Assistance: 3: Mod assist Locomotion: Ambulation: 0: Activity did not occur  Comprehension Comprehension Mode: Auditory Comprehension: 7-Follows complex conversation/direction: With no assist  Expression Expression Mode: Verbal Expression: 7-Expresses complex ideas: With no assist  Social Interaction Social Interaction: 7-Interacts appropriately with others - No medications needed.  Problem Solving Problem Solving: 7-Solves complex problems: Recognizes & self-corrects  Memory Memory: 7-Complete Independence: No helper  Cauda equina syndrome with B/B issues.  1. DVT Prophylaxis/Anticoagulation: Pharmaceutical: Lovenox  -dopplers negative 2. Chronic back pain/Pain Management: prn medcications effective.  3. H/o depression with anxiety/Mood: continue to Provide ego support. 4. Neuropsych: This patient is capable of making decisions on her own behalf.  5. HTN: On HCTZ and cozaar. Decreased cozaar to 25 and set parameters on BP meds to avoid orthostasis.  -continue to hold hctz  -TEDS, adequate fluids. ?binder if needed, anxiety control/reassurance  -overall improved 6. ABLA: hgb 9.5, continue iron supplement.  7. Neurogenic Bowel and bladder: improving function -bowel meds--backing off regimen slightly  8. Wound: -improving   -continue current dressing 9. Bronchitis--po levaquin, mucinex. Feeling better today  -encouraging fluids  -IS, oob  -albuterol neb, decadron increased temporarily LOS (Days) 15 A FACE TO FACE EVALUATION WAS PERFORMED  Sherry Wong T 09/23/2013 7:15 AM

## 2013-09-23 NOTE — Progress Notes (Signed)
Patient is on 1800 bowel program with suppository.  Patient unable to give self suppository due to back precaution and not able to twist her back.  Patient had large bowel movement during the day and states she would like to try without the suppository, she believes she can goes without it.

## 2013-09-24 ENCOUNTER — Inpatient Hospital Stay (HOSPITAL_COMMUNITY): Payer: BC Managed Care – PPO

## 2013-09-24 ENCOUNTER — Inpatient Hospital Stay (HOSPITAL_COMMUNITY): Payer: BC Managed Care – PPO | Admitting: Physical Therapy

## 2013-09-24 LAB — CULTURE, BLOOD (ROUTINE X 2)
Culture: NO GROWTH
Culture: NO GROWTH

## 2013-09-24 NOTE — Progress Notes (Signed)
Occupational Therapy Session Note  Patient Details  Name: Sherry Wong MRN: [id number] Date of Birth: Feb 16, 1956  Today's Date: 09/24/2013 Time: 0930-1030 Time Calculation (min): 60 min  Short Term Goals: Week 3:  OT Short Term Goal 1 (Week 3): Patient will complete transfer from w/c to standard tub and tub bench with max assist to manage LE OT Short Term Goal 2 (Week 3): Patient will complete lower body dressing with min assist OT Short Term Goal 3 (Week 3): Patient will complete simple meal prep using LRAD with min assist OT Short Term Goal 4 (Week 3): Patient will demo ability to complete HEP for general strengthening independently  Skilled Therapeutic Interventions: ADL-retraining with emphasis on patient re-ed on use of DME in prep for planned d/c, dynamic sitting balance, general endurance, transfers, adherence to back precautions.   Patient now completes modified squat pivot transfers consistently without assistance from/to w/c to/from bed and/or BSC and/or tub bench.   Patient performs seated bathing using lateral leans for cleaning buttocks and forward scoot to improve access to peri-area.   With use of diaper to simulate underwear, patient laced by underwear and pants up to her knees using reach and completed sit>stand at bedrail but required assist to pull up pants due to LE weakness limiting dynamic standing balance.   Patient also reports plan to accept min assist from her sister and husband for lower body bathing and dressing tasks, as needed.    Therapy Documentation Precautions:  Precautions Precautions: Back;Fall Precaution Booklet Issued: Yes (comment) Restrictions Weight Bearing Restrictions: No  Pain: Pain Assessment Pain Assessment: No/denies pain  ADL: ADL ADL Comments: see FIM  See FIM for current functional status  Therapy/Group: Individual Therapy  Second session: Time: 1115-1205 Time Calculation (min):  50 min  Pain Assessment: 5/10, low back, RN  aware, meds  Skilled Therapeutic Interventions: Therapeutic activities with emphasis on patient ed and training on DME, home mods, standard tub transfer using tub bench, and introduction to HEP for upper body.   Patient completed tub transfer to standard tub using tub bench from w/c with mod assist (lifting) due to height of tub bench not being adjustable to match w/c height.   Return transfer required only steadying assist for safety and verbal cues for technique to maintain back precautions against arching or twisting.   Patient advised to request tub bench with peri-area cut-out for improved thoroughness.    From bathroom, patient was escorted to gym for HEP instruction using thera-band, medium resistance (green).   Patient completed 6 exercises demo'd with repeated demonstrations and verbal cues for technique.   Written and graphic exercise instruction materials provided from [email or web address]   See FIM for current functional status  Therapy/Group: Individual Therapy  Third  session: Time: 1430-1500 Time Calculation (min): 30 min  Pain Assessment: No pain  Skilled Therapeutic Interventions: ADL-retraining with emphasis on iADL; simple meal prep.   At w/c level, patient prepared 1 grilled cheese sandwich.   Patient educated on strategies to reduce burn/sharps injury risk, conserve energy, and initiate simple home mods in kitchen spaces to improve efficiency with simple meal prep tasks.   Patient reported satisfaction with encounter and affirmed plan to alert her husband to specific changes needed in their home for improved safety and efficiency.  See FIM for current functional status  Therapy/Group: Individual Therapy  Crosley Stejskal 09/24/2013, 10:32 AM

## 2013-09-24 NOTE — Plan of Care (Signed)
Problem: RH PAIN MANAGEMENT Goal: RH STG PAIN MANAGED AT OR BELOW PT'S PAIN GOAL 3 or less on scale 0-10  Outcome: Not Progressing Rated pain 6/10

## 2013-09-24 NOTE — Plan of Care (Signed)
Problem: RH SKIN INTEGRITY Goal: RH STG SKIN FREE OF INFECTION/BREAKDOWN No new skin breakdown or infection while in mod assist rehab  Outcome: Not Progressing Mid-low back incision,wmoist white center w/ small amount of yellow drainage.

## 2013-09-24 NOTE — Progress Notes (Signed)
Nursing Note: Up to bsc to void and coughing and a little sob,requests hhn Tx.A: Treatment given and prn xanax and pt felt better and ready to go back to sleep.wbb

## 2013-09-24 NOTE — Progress Notes (Signed)
Subjective/Complaints: Continues to improve. Coughing up phlegm.  A 12 point review of systems has been performed and if not noted above is otherwise negative.   Objective: Vital Signs: Blood pressure 112/79, pulse 97, temperature 97.8 F (36.6 C), temperature source Oral, resp. rate 18, height 5\' 8"  (1.727 m), weight 108.591 kg (239 lb 6.4 oz), SpO2 100.00%. No results found. No results found for this basename: WBC, HGB, HCT, PLT,  in the last 72 hours No results found for this basename: NA, K, CL, CO, GLUCOSE, BUN, CREATININE, CALCIUM,  in the last 72 hours CBG (last 3)  No results found for this basename: GLUCAP,  in the last 72 hours  Wt Readings from Last 3 Encounters:  09/22/13 108.591 kg (239 lb 6.4 oz)  09/07/13 112 kg (246 lb 14.6 oz)  09/07/13 112 kg (246 lb 14.6 oz)    Physical Exam:  Constitutional: She is oriented to person, place, and time. She appears well-developed and well-nourished.  HENT: voice raspy Head: Normocephalic and atraumatic.  Eyes: Conjunctivae are normal. Pupils are equal, round, and reactive to light.  Neck: Neck supple.  Cardiovascular: Normal rate and regular rhythm.  Respiratory: Effort normal   No respiratory distress. She has a few wheezes and rhonchi GI: Soft. Bowel sounds are normal.  There is no tenderness. There is no rebound.  Musculoskeletal: She exhibits trace edema.  Neurological: She is alert and oriented to person, place, and time.    2-2+/5 at HF, Right KE 2- to 2, L knee 2 to 2+, right ankle 1 to 1+, left ankle 2-. dtr's trace to absent  Skin: Skin is warm and dry.  Small white area without drainage. Wound looks clean, well approximated Psychiatric: She has a normal mood and affect. Her behavior is normal. Judgment and thought content normal.    Assessment/Plan: 1. Functional deficits secondary to cauda equina syndrome which require 3+ hours per day of interdisciplinary therapy in a comprehensive inpatient rehab  setting. Physiatrist is providing close team supervision and 24 hour management of active medical problems listed below. Physiatrist and rehab team continue to assess barriers to discharge/monitor patient progress toward functional and medical goals.     FIM: FIM - Bathing Bathing Steps Patient Completed: Chest;Right Arm;Left Arm;Abdomen;Front perineal area;Right upper leg;Left upper leg;Left lower leg (including foot);Right lower leg (including foot);Buttocks Bathing: 6: More than reasonable amount of time (lateral leans for buttocks)  FIM - Upper Body Dressing/Undressing Upper body dressing/undressing steps patient completed: Thread/unthread right bra strap;Thread/unthread left bra strap;Hook/unhook bra;Thread/unthread right sleeve of pullover shirt/dresss;Thread/unthread left sleeve of pullover shirt/dress;Put head through opening of pull over shirt/dress;Pull shirt over trunk Upper body dressing/undressing: 7: Complete Independence: No helper FIM - Lower Body Dressing/Undressing Lower body dressing/undressing steps patient completed: Thread/unthread right pants leg;Thread/unthread left pants leg Lower body dressing/undressing: 1: Total-Patient completed less than 25% of tasks  FIM - Toileting Toileting steps completed by patient: Adjust clothing prior to toileting;Performs perineal hygiene Toileting Assistive Devices: Grab bar or rail for support Toileting: 3: Mod-Patient completed 2 of 3 steps  FIM - Diplomatic Services operational officer Devices: Psychiatrist Transfers: 5-To toilet/BSC: Supervision (verbal cues/safety issues);3-From toilet/BSC: Mod A (lift or lower assist)  FIM - Bed/Chair Transfer Bed/Chair Transfer Assistive Devices: Bed rails;HOB elevated;Arm rests Bed/Chair Transfer: 5: Bed > Chair or W/C: Supervision (verbal cues/safety issues);5: Chair or W/C > Bed: Supervision (verbal cues/safety issues)  FIM - Locomotion: Wheelchair Distance:  180 Locomotion: Wheelchair: 5: Travels 150 ft or more: maneuvers  on rugs and over door sills with supervision, cueing or coaxing FIM - Locomotion: Ambulation Locomotion: Ambulation Assistive Devices: Walker - Rolling Ambulation/Gait Assistance: 3: Mod assist Locomotion: Ambulation: 0: Activity did not occur  Comprehension Comprehension Mode: Auditory Comprehension: 7-Follows complex conversation/direction: With no assist  Expression Expression Mode: Verbal Expression: 7-Expresses complex ideas: With no assist  Social Interaction Social Interaction: 7-Interacts appropriately with others - No medications needed.  Problem Solving Problem Solving: 7-Solves complex problems: Recognizes & self-corrects  Memory Memory: 7-Complete Independence: No helper  Cauda equina syndrome with B/B issues.  1. DVT Prophylaxis/Anticoagulation: Pharmaceutical: Lovenox  -dopplers negative 2. Chronic back pain/Pain Management: prn medcications effective.  3. H/o depression with anxiety/Mood: continue to Provide ego support. 4. Neuropsych: This patient is capable of making decisions on her own behalf.  5. HTN: On HCTZ and cozaar. Decreased cozaar to 25 and set parameters on BP meds to avoid orthostasis.  -continue to hold hctz  -TEDS, adequate fluids. ?binder if needed  -overall improved 6. ABLA: hgb 9.5, continue iron supplement.  7. Neurogenic Bowel and bladder: improving function -bowel meds--backing off regimen slightly  8. Wound: -improving, only small white, necrotic area---wound looks very clean, no drainage really now   -continue with dry dressing 9. Bronchitis--po levaquin, mucinex. Gradually improving  -encouraging fluids  -IS, oob  -albuterol neb, decadron increased temporarily LOS (Days) 16 A FACE TO FACE EVALUATION WAS PERFORMED  Jalien Weakland T 09/24/2013 8:20 AM

## 2013-09-24 NOTE — Progress Notes (Signed)
Physical Therapy Note  Patient Details  Name: Sherry Wong MRN: [id number] Date of Birth: 31-Aug-1956 Today's Date: 09/24/2013  Time: 730-825 55 minutes  1:1 No c/o pain.  Bed mobility and seated balance edge of bed for lower body dressing with supervision.  Pt needed assist to don pants by lateral leaning.  Scoot transfers to w/c with supervision.  W/c mobility throughout unit with mod I.  Sit to stands at hi-lo mat with focus on LE strength and endurance.  Pt able to stand up to 4 minutes x 3 attempts.  Able to perform alternating UE lifts, only able to lift UE 4-5 seconds before needing B UE support due to LE fatigue.  Pt pleased with progress in standing.  Supine LE stretching per pt request.  B hip IR/ER with noted decreased ROM.   DONAWERTH,KAREN 09/24/2013, 8:23 AM

## 2013-09-25 ENCOUNTER — Encounter (HOSPITAL_COMMUNITY): Payer: BC Managed Care – PPO | Admitting: Occupational Therapy

## 2013-09-25 ENCOUNTER — Inpatient Hospital Stay (HOSPITAL_COMMUNITY): Payer: BC Managed Care – PPO | Admitting: Rehabilitation

## 2013-09-25 ENCOUNTER — Inpatient Hospital Stay (HOSPITAL_COMMUNITY): Payer: BC Managed Care – PPO | Admitting: *Deleted

## 2013-09-25 NOTE — Progress Notes (Signed)
Occupational Therapy Note  Patient Details  Name: Sherry Wong MRN: [id number] Date of Birth: Dec 22, 1955 Today's Date: 09/25/2013  Time In:  8:00 Time Out:  8:54.  Individual session, no c/o pain.  Patient did state she felt worn out from yesterday because she worked very hard and sat up in the wheelchair the majority of the day. Patient deferred shower stating she showered yesterday. ADL retraining at sink level with focus on bed mobility, bed to wheelchair transfers, increasing activity tolerance, use of AE and incorporation of back precautions into ADL, safety.  Attempted sit to stand, patient unable today despite max assist and several attempts.  Patient completed sit to squat to allow caregiver to hike pants.  Patient very motivated for independence.  See FIM for functional status.    Norton Pastel 09/25/2013, 8:55 AM

## 2013-09-25 NOTE — Progress Notes (Signed)
Physical Therapy Session Note  Patient Details  Name: Sherry Wong MRN: [id number] Date of Birth: 05-Jan-1956  Today's Date: 09/25/2013 Time: 1500-1531 Time Calculation (min): 31 min  Short Term Goals: Week 2:  PT Short Term Goal 1 (Week 2): Scoot transfers to/from level surfaces with supervision. PT Short Term Goal 1 - Progress (Week 2): Met PT Short Term Goal 2 (Week 2): Sit <> stand from wheelchair with moderate assistance PT Short Term Goal 2 - Progress (Week 2): Met PT Short Term Goal 3 (Week 2): Ambulation x 10' with RW and mod assist on consistant basis PT Short Term Goal 3 - Progress (Week 2): Not met  Skilled Therapeutic Interventions/Progress Updates:   Pt received in gym, having just finished with previous PT session.  Per PT attempted maxi sky in // bars with one sling, however pt agreeable to attempt using over head harness for gait with maxi sky in // bars.  Donned sling and began to elevate pt with her assisting to stand, however pt states she did not feel comfortable in sling due to sling coming up to her neck.  Attempted single stand in // bars, however noted pt very fatigued.  Assisted pt back to room and she stated that she needed to use bedside commode prior to returning to bed.  Performed w/c <> bedside commode at supervision level with pt able to perform lateral leans to doff pants and brief.  She was able to void urine in bedside commode and use tongs to perform peri care at supervision level.  Pt able to turn w/c in preparation for transfer back to bed, however due to increased fatigue and increased height of bed, requires mod assist this afternoon to increase forward weight shift to elevate buttocks to bed.  Also requires assist in adjusting LEs for proper positioning. Once in bed, pt states she wishes to have brief under her, but not attached.  Performed several reps of rolling at supervision level and bridging at min assist in order to adjust brief properly.  Pt left in  bed with bed alarm set and all needs in reach.    Therapy Documentation Precautions:  Precautions Precautions: Back;Fall Precaution Booklet Issued: Yes (comment) Precaution Comments: Pt able to recall 2 of 3 Restrictions Weight Bearing Restrictions: No   Vital Signs: Oxygen Therapy SpO2: 97 % O2 Device: None (Room air) Pain: Pain Assessment Pain Assessment: 0-10 Pain Score: 2  Faces Pain Scale: Hurts a little bit Pain Type: Acute pain Pain Location: Back Pain Orientation: Lower Pain Descriptors / Indicators: Aching Pain Frequency: Intermittent Pain Onset: Gradual Patients Stated Pain Goal: 2 Pain Intervention(s): Medication (See eMAR)    See FIM for current functional status  Therapy/Group: Individual Therapy  Vista Deck 09/25/2013, 4:03 PM

## 2013-09-25 NOTE — Progress Notes (Signed)
Physical Therapy Session Note  Patient Details  Name: Sherry Wong MRN: [id number] Date of Birth: 1956-06-06  Today's Date: 09/25/2013 Time: 1002-1058 Time Calculation (min): 56 min  Short Term Goals: Week 3:     Skilled Therapeutic Interventions/Progress Updates:   Pt received sitting in w/c in room, agreeable to therapy this morning.  Requested to perform supine stretches prior to session due to feeling "stiff."  Pt self propelled >150' x 2 to/from gym with BUEs at Mod I level this morning.  She was also able to set up the w/c prior to transferring to mat.  Performed lateral scoot transfer at close supervision today with some assist to keep w/c from moving during transfer.  Min cues for increased forward weight shift during transfer.  While lying in supine, performed self hamstring stretch BLE x 30 secs each with use of sheet, knee to chest stretch x 30 secs each with use of sheet at knee, passive stretch to hip IR/ER x 30 secs each.  Pt tolerated well and educated on how to perform with use of sheet on her own and at home.  Remainder of session focused on standing, pregait and gait in // bars.  Performed standing x 4 reps at mod assist (more max for first rep, but remainder were mod) with manual facilitation for increased forward weight shift when standing and tapping at glutes for more upright posture.  On second rep of standing, had pt step forward/backward with LLE for increased weight shift and weight bearing through RLE, progressing to stepping with RLE on third rep (for WS and WB through LLE), and on last rep, performed 4 steps forward and backwards in each LE.  Provided manual assist for RLE when backing up, however she did well today clearing LE when stepping forward.  Pt self propelled back to room and transferred to recliner in same fashion mentioned above with husband present all needs in reach.    Therapy Documentation Precautions:  Precautions Precautions: Back;Fall Precaution  Booklet Issued: Yes (comment) Precaution Comments: Pt able to recall 2 of 3 Restrictions Weight Bearing Restrictions: No   Pain: Pain Assessment Pain Assessment: 0-10 Pain Score: 8  Pain Type: Acute pain Pain Location: Back Pain Orientation: Lower Pain Descriptors / Indicators: Aching Pain Frequency: Intermittent Pain Onset: Gradual Patients Stated Pain Goal: 2 Pain Intervention(s): Medication (See eMAR)  See FIM for current functional status  Therapy/Group: Individual Therapy  Vista Deck 09/25/2013, 11:01 AM

## 2013-09-25 NOTE — Progress Notes (Signed)
Physical Therapy Session Note  Patient Details  Name: Sherry Wong MRN: [id number] Date of Birth: Jul 28, 1956  Today's Date: 09/25/2013 Time: 1415-1500 Time Calculation (min): 45 min  Short Term Goals: Week 2:  PT Short Term Goal 1 (Week 2): Scoot transfers to/from level surfaces with supervision. PT Short Term Goal 1 - Progress (Week 2): Met PT Short Term Goal 2 (Week 2): Sit <> stand from wheelchair with moderate assistance PT Short Term Goal 2 - Progress (Week 2): Met PT Short Term Goal 3 (Week 2): Ambulation x 10' with RW and mod assist on consistant basis PT Short Term Goal 3 - Progress (Week 2): Not met Week 3:     Skilled Therapeutic Interventions/Progress Updates:    Tx focused on transfers, bed mobility, LE stretching, and attempts at gait in maxi sky with // bars Lateral scoot transfers from level surfaces x4 with S and cues for clearing gap. Sit<>supine x3 on mat L side with S only and cues for most effective and safe rolling technique. Passive and active LE stretching bil x1 min each of the following: hip IR, hip ER, HS stretching, IT band stretch. Pt able to perform some hip>chest and HS stretch with sheet.  Sit<>stand in // bars with Min A for lifting/lowering. Pt stood x8min while adjusting belt harness.  Attempted belt harness with maxi-sky, but pt only able to ambulate 2' forward and back with mod A, needing more trunk support. Next therapist will attempt with vest harness, handoff to PT.   Therapy Documentation Precautions:  Precautions Precautions: Back;Fall Precaution Booklet Issued: Yes (comment) Precaution Comments: Pt able to recall 2 of 3 Restrictions Weight Bearing Restrictions: No    Vital Signs: Oxygen Therapy SpO2: 97 % O2 Device: None (Room air) Pain: No complaints except for tight hip muscles, provided passive and active stretching with good relief   Locomotion : Ambulation Ambulation/Gait Assistance: 3: Mod assist Wheelchair  Mobility Distance: 180   See FIM for current functional status  Therapy/Group: Individual Therapy Clydene Laming, PT, DPT  09/25/2013, 3:20 PM

## 2013-09-25 NOTE — Progress Notes (Signed)
Subjective/Complaints: No problems today. Cough better. Pain controlled. Making gains with therapy.  A 12 point review of systems has been performed and if not noted above is otherwise negative.   Objective: Vital Signs: Blood pressure 120/79, pulse 89, temperature 97.8 F (36.6 C), temperature source Oral, resp. rate 18, height 5\' 8"  (1.727 m), weight 108.591 kg (239 lb 6.4 oz), SpO2 100.00%. No results found. No results found for this basename: WBC, HGB, HCT, PLT,  in the last 72 hours No results found for this basename: NA, K, CL, CO, GLUCOSE, BUN, CREATININE, CALCIUM,  in the last 72 hours CBG (last 3)  No results found for this basename: GLUCAP,  in the last 72 hours  Wt Readings from Last 3 Encounters:  09/22/13 108.591 kg (239 lb 6.4 oz)  09/07/13 112 kg (246 lb 14.6 oz)  09/07/13 112 kg (246 lb 14.6 oz)    Physical Exam:  Constitutional: She is oriented to person, place, and time. She appears well-developed and well-nourished.  HENT: voice raspy Head: Normocephalic and atraumatic.  Eyes: Conjunctivae are normal. Pupils are equal, round, and reactive to light.  Neck: Neck supple.  Cardiovascular: Normal rate and regular rhythm.  Respiratory: Effort normal   No respiratory distress. She has a few wheezes and rhonchi GI: Soft. Bowel sounds are normal.  There is no tenderness. There is no rebound.  Musculoskeletal: She exhibits trace edema.  Neurological: She is alert and oriented to person, place, and time. Sensation a little improved over soles of feet   2-2+/5 at HF, Right KE 2- to 2, L knee 2 to 2+, right ankle 1 to 1+, left ankle 2-. dtr's trace to absent  Skin: Skin is warm and dry.  Small white area without drainage. Wound looks clean, well approximated Psychiatric: She has a normal mood and affect. Her behavior is normal. Judgment and thought content normal.    Assessment/Plan: 1. Functional deficits secondary to cauda equina syndrome which require 3+ hours per  day of interdisciplinary therapy in a comprehensive inpatient rehab setting. Physiatrist is providing close team supervision and 24 hour management of active medical problems listed below. Physiatrist and rehab team continue to assess barriers to discharge/monitor patient progress toward functional and medical goals.     FIM: FIM - Bathing Bathing Steps Patient Completed: Chest;Right Arm;Left Arm;Abdomen;Front perineal area;Buttocks;Right upper leg;Left upper leg;Right lower leg (including foot);Left lower leg (including foot) Bathing: 6: Assistive device (Comment)  FIM - Upper Body Dressing/Undressing Upper body dressing/undressing steps patient completed: Thread/unthread right bra strap;Thread/unthread left bra strap;Thread/unthread right sleeve of pullover shirt/dresss;Thread/unthread left sleeve of pullover shirt/dress;Put head through opening of pull over shirt/dress;Pull shirt over trunk Upper body dressing/undressing: 7: Complete Independence: No helper FIM - Lower Body Dressing/Undressing Lower body dressing/undressing steps patient completed: Thread/unthread right pants leg;Thread/unthread left pants leg Lower body dressing/undressing: 3: Mod-Patient completed 50-74% of tasks  FIM - Toileting Toileting steps completed by patient: Adjust clothing prior to toileting;Performs perineal hygiene Toileting Assistive Devices: Grab bar or rail for support Toileting: 3: Mod-Patient completed 2 of 3 steps  FIM - Diplomatic Services operational officer Devices: Psychiatrist Transfers: 5-To toilet/BSC: Supervision (verbal cues/safety issues);3-From toilet/BSC: Mod A (lift or lower assist)  FIM - Bed/Chair Transfer Bed/Chair Transfer Assistive Devices: Bed rails;HOB elevated;Arm rests Bed/Chair Transfer: 5: Bed > Chair or W/C: Supervision (verbal cues/safety issues);5: Chair or W/C > Bed: Supervision (verbal cues/safety issues)  FIM - Locomotion: Wheelchair Distance:  180 Locomotion: Wheelchair: 5: Travels 150 ft or  more: maneuvers on rugs and over door sills with supervision, cueing or coaxing FIM - Locomotion: Ambulation Locomotion: Ambulation Assistive Devices: Walker - Rolling Ambulation/Gait Assistance: 3: Mod assist Locomotion: Ambulation: 0: Activity did not occur  Comprehension Comprehension Mode: Auditory Comprehension: 7-Follows complex conversation/direction: With no assist  Expression Expression Mode: Verbal Expression: 7-Expresses complex ideas: With no assist  Social Interaction Social Interaction: 7-Interacts appropriately with others - No medications needed.  Problem Solving Problem Solving: 7-Solves complex problems: Recognizes & self-corrects  Memory Memory: 7-Complete Independence: No helper  Cauda equina syndrome with B/B issues.  1. DVT Prophylaxis/Anticoagulation: Pharmaceutical: Lovenox  -dopplers negative 2. Chronic back pain/Pain Management: prn medcications effective.  3. H/o depression with anxiety/Mood: continue to Provide ego support. 4. Neuropsych: This patient is capable of making decisions on her own behalf.  5. HTN: On HCTZ and cozaar. Decreased cozaar to 25 and set parameters on BP meds to avoid orthostasis.  -continue to hold hctz  -TEDS, adequate fluids. ?binder if needed  -overall improved 6. ABLA: hgb 9.5, continue iron supplement.  7. Neurogenic Bowel and bladder: improving function -bowel meds--backing off regimen slightly  8. Wound: -improving, only small white, necrotic area---wound looks very clean, minimal to no drainage   -continue with dry dressing 9. Bronchitis--po levaquin, mucinex--contniue. Gradually improving  -encouraging fluids  -IS, oob  -albuterol neb, decadron increased temporarily---decrease again tomorrow LOS (Days) 17 A FACE TO FACE EVALUATION WAS PERFORMED  Sherry Wong T 09/25/2013 7:11 AM

## 2013-09-25 NOTE — Progress Notes (Addendum)
RT protocol assessment done, pt's severity score was 3 which does not indicate scheduled nebulizer treatments with no documented respiratory history. Pt does have slight expiratory wheeze, but is unchanged with neb treatment.  Pt's neb treatments have been changed to PRN for SOB/wheezing. RT will continue to monitor.

## 2013-09-26 MED ORDER — DEXAMETHASONE 4 MG PO TABS
4.0000 mg | ORAL_TABLET | Freq: Two times a day (BID) | ORAL | Status: DC
  Administered 2013-09-26 – 2013-09-29 (×6): 4 mg via ORAL
  Filled 2013-09-26 (×8): qty 1

## 2013-09-26 NOTE — Progress Notes (Signed)
Subjective/Complaints: Had a good night. Encouraged by improved mobility, especially from the bed yesterday..  A 12 point review of systems has been performed and if not noted above is otherwise negative.   Objective: Vital Signs: Blood pressure 118/79, pulse 74, temperature 97.9 F (36.6 C), temperature source Oral, resp. rate 18, height 5\' 8"  (1.727 m), weight 108.591 kg (239 lb 6.4 oz), SpO2 99.00%. No results found. No results found for this basename: WBC, HGB, HCT, PLT,  in the last 72 hours No results found for this basename: NA, K, CL, CO, GLUCOSE, BUN, CREATININE, CALCIUM,  in the last 72 hours CBG (last 3)  No results found for this basename: GLUCAP,  in the last 72 hours  Wt Readings from Last 3 Encounters:  09/22/13 108.591 kg (239 lb 6.4 oz)  09/07/13 112 kg (246 lb 14.6 oz)  09/07/13 112 kg (246 lb 14.6 oz)    Physical Exam:  Constitutional: She is oriented to person, place, and time. She appears well-developed and well-nourished.  HENT: voice raspy Head: Normocephalic and atraumatic.  Eyes: Conjunctivae are normal. Pupils are equal, round, and reactive to light.  Neck: Neck supple.  Cardiovascular: Normal rate and regular rhythm.  Respiratory: Effort normal   No respiratory distress. She has a few wheezes and rhonchi GI: Soft. Bowel sounds are normal.  There is no tenderness. There is no rebound.  Musculoskeletal: She exhibits trace edema.  Neurological: She is alert and oriented to person, place, and time. Sensation a little improved over soles of feet   2-2+/5 at HF, Right KE 2- to 2, L knee 2 to 2+, right ankle 1 to 1+, left ankle 2-. dtr's trace to absent  Skin: Skin is warm and dry.  Small white area without drainage. Wound looks clean, well approximated Psychiatric: She has a normal mood and affect. Her behavior is normal. Judgment and thought content normal.    Assessment/Plan: 1. Functional deficits secondary to cauda equina syndrome which require 3+  hours per day of interdisciplinary therapy in a comprehensive inpatient rehab setting. Physiatrist is providing close team supervision and 24 hour management of active medical problems listed below. Physiatrist and rehab team continue to assess barriers to discharge/monitor patient progress toward functional and medical goals.     FIM: FIM - Bathing Bathing Steps Patient Completed: Chest;Right Arm;Left Arm;Abdomen;Front perineal area;Buttocks;Right upper leg;Left upper leg;Right lower leg (including foot);Left lower leg (including foot) Bathing: 6: More than reasonable amount of time (if patient keeps item on sink does not require set up)  FIM - Upper Body Dressing/Undressing Upper body dressing/undressing steps patient completed: Thread/unthread right bra strap;Thread/unthread left bra strap;Hook/unhook bra;Thread/unthread right sleeve of pullover shirt/dresss Upper body dressing/undressing: 6: Assistive device (Comment) (uses wheelchair to obtain clothes) FIM - Lower Body Dressing/Undressing Lower body dressing/undressing steps patient completed: Thread/unthread right underwear leg;Thread/unthread left underwear leg;Thread/unthread right pants leg;Thread/unthread left pants leg (brief, teds, and braces all considered set up. 4/8 activities) Lower body dressing/undressing: 3: Mod-Patient completed 50-74% of tasks  FIM - Toileting Toileting steps completed by patient: Adjust clothing prior to toileting;Performs perineal hygiene Toileting Assistive Devices: Grab bar or rail for support Toileting: 0: Activity did not occur  FIM - Diplomatic Services operational officer Devices: Psychiatrist Transfers: 0-Activity did not occur  FIM - Banker Devices: Arm rests;Orthosis Bed/Chair Transfer: 3: Chair or W/C > Bed: Mod A (lift or lower assist)  FIM - Locomotion: Wheelchair Distance: 180 Locomotion: Wheelchair: 5: Travels 150 ft  or more:  maneuvers on rugs and over door sills with supervision, cueing or coaxing FIM - Locomotion: Ambulation Locomotion: Ambulation Assistive Devices: Parallel bars Ambulation/Gait Assistance: 3: Mod assist Locomotion: Ambulation: 1: Travels less than 50 ft with moderate assistance (Pt: 50 - 74%)  Comprehension Comprehension Mode: Auditory Comprehension: 7-Follows complex conversation/direction: With no assist  Expression Expression Mode: Verbal Expression: 7-Expresses complex ideas: With no assist  Social Interaction Social Interaction: 7-Interacts appropriately with others - No medications needed.  Problem Solving Problem Solving: 7-Solves complex problems: Recognizes & self-corrects  Memory Memory: 7-Complete Independence: No helper  Cauda equina syndrome with B/B issues.  1. DVT Prophylaxis/Anticoagulation: Pharmaceutical: Lovenox  -dopplers negative 2. Chronic back pain/Pain Management: prn medcications effective.  3. H/o depression with anxiety/Mood: continue to Provide ego support. 4. Neuropsych: This patient is capable of making decisions on her own behalf.  5. HTN: On HCTZ and cozaar. Decreased cozaar to 25 and set parameters on BP meds to avoid orthostasis.  -continue to hold hctz  -TEDS, adequate fluids. ?binder if needed  -overall improved 6. ABLA: hgb 9.5, continue iron supplement.  7. Neurogenic Bowel and bladder: improving function -bowel meds--backing off regimen slightly  8. Wound: -improving, only small white, necrotic area---wound looks very clean, minimal to no drainage   -continue with dry dressing 9. Bronchitis--po levaquin, mucinex--contniue--improved  -encouraging fluids  -IS, FV, oob  -albuterol neb, decadron increased temporarily---change back to bid today LOS (Days) 18 A FACE TO FACE EVALUATION WAS PERFORMED  SWARTZ,ZACHARY T 09/26/2013 7:35 AM

## 2013-09-27 ENCOUNTER — Inpatient Hospital Stay (HOSPITAL_COMMUNITY): Payer: BC Managed Care – PPO | Admitting: Physical Therapy

## 2013-09-27 ENCOUNTER — Inpatient Hospital Stay (HOSPITAL_COMMUNITY): Payer: BC Managed Care – PPO

## 2013-09-27 DIAGNOSIS — K592 Neurogenic bowel, not elsewhere classified: Secondary | ICD-10-CM

## 2013-09-27 DIAGNOSIS — N319 Neuromuscular dysfunction of bladder, unspecified: Secondary | ICD-10-CM

## 2013-09-27 DIAGNOSIS — S343XXA Injury of cauda equina, initial encounter: Secondary | ICD-10-CM

## 2013-09-27 MED ORDER — ALBUTEROL SULFATE (2.5 MG/3ML) 0.083% IN NEBU: 2.5000 mg | INHALATION_SOLUTION | RESPIRATORY_TRACT | Status: DC | PRN

## 2013-09-27 NOTE — Progress Notes (Addendum)
Physical Therapy Session Note  Patient Details  Name: Sherry Wong MRN: [id number] Date of Birth: June 17, 1956  Today's Date: 09/27/2013 Time: 7829-5621 Time Calculation (min): 45 min   Skilled Therapeutic Interventions/Progress Updates:    Session focused on family education. Practiced sit <> stands to wheelchair, husband and sister practice one rep each, sister demonstrates higher level of understanding and skill. Husband provided supervision for car transfer with min/mod cues from therapist and pt. Sister provided supervision for pt scoot transfer wheelchair <> bed, pt able to perform sit > supine with supervision, increased time, and use of leg lifter. Discussed plan of action if fall were to occur. Advised pt keep phone or "safety alert" system on her body at all times. No further questions from family at this time.   Pt does a great job at directing care.  Therapy Documentation Precautions:  Precautions Precautions: Back;Fall Precaution Booklet Issued: Yes (comment) Precaution Comments: Pt able to recall 3 of 3 Restrictions Weight Bearing Restrictions: No Pain:  Faces 4/10 low back pain, repositioned   See FIM for current functional status  Therapy/Group: Individual Therapy  Wilhemina Bonito 09/27/2013, 2:08 PM

## 2013-09-27 NOTE — Plan of Care (Signed)
Problem: RH SKIN INTEGRITY Goal: RH STG SKIN FREE OF INFECTION/BREAKDOWN No new skin breakdown or infection while in mod assist rehab  Outcome: Progressing Incision healing, but requires daily dressing change to lumbar area

## 2013-09-27 NOTE — Plan of Care (Signed)
Problem: SCI BLADDER ELIMINATION Goal: RH STG MANAGE BLADDER WITH ASSISTANCE STG Manage Bladder With Assistance. Mod I  Outcome: Progressing Pt c/o of urinary stress. Wears brief for support.

## 2013-09-27 NOTE — Plan of Care (Signed)
Problem: RH SAFETY Goal: RH STG ADHERE TO SAFETY PRECAUTIONS W/ASSISTANCE/DEVICE STG Adhere to Safety Precautions With Assistance/Device. Supervision  Outcome: Progressing No unsafe behavior     

## 2013-09-27 NOTE — Progress Notes (Signed)
Physical Therapy Session Note  Patient Details  Name: Sherry Wong MRN: [id number] Date of Birth: 08/25/1956  Today's Date: 09/27/2013 Time: 1120-1203 Time Calculation (min): 43 min  Short Term Goals: Week 3:   STG=LTG  Skilled Therapeutic Interventions/Progress Updates:    Pt received seated in w/c with husband and sister present. Focus of session family education for car transfer. Pt propelled w/c to ortho gym mod I with single rest break required. Discussed sequence and technique for car transfer with pt, husband, and sister; all verbalized understanding. Pt attempted scooting to practice car, reported she could not continue due to L leg pain and tightness. Pt performed lateral scoot transfer w/c<>mat with husband providing supervision. Student PT provided passive stretch to BLE to decrease pain. Returned to practice car, pt performed lateral scoot transfer w/c<>car with sister providing supervision during scooting and mod A to complete transfer (assist both legs into/out of car). Provided verbal cues for w/c placement and technique. Pt's sister demonstrated folding and storage of w/c. Pt, sister, and husband verbalized understanding and comfort with performing car transfer. Pt performed w/c mobility in ADL apartment mod I and demonstrated appropriate positioning of w/c for transfer to bed. Discussed with husband importance of allowing pt to do as much as possible and only providing physical assistance if needed for safety, husband agreed. Pt returned to room, left seated in w/c with needs in reach and family present.   Therapy Documentation Precautions:  Precautions Precautions: Back;Fall Precaution Booklet Issued: Yes (comment) Precaution Comments: Pt able to recall 2 of 3 Restrictions Weight Bearing Restrictions: No Pain: Pt c/o of pain and tightness in L leg. Performed passive stretching L and RLE with pt reporting pain relief.  Locomotion : Wheelchair Mobility Distance: 200    See FIM for current functional status  Therapy/Group: Individual Therapy  Miran Kautzman 09/27/2013, 12:10 PM

## 2013-09-27 NOTE — Progress Notes (Signed)
Subjective/Complaints: Had a quiet day yesterday. Anxious to get started today. No complaints.  A 12 point review of systems has been performed and if not noted above is otherwise negative.   Objective: Vital Signs: Blood pressure 139/81, pulse 74, temperature 97.7 F (36.5 C), temperature source Oral, resp. rate 18, height 5\' 8"  (1.727 m), weight 108.591 kg (239 lb 6.4 oz), SpO2 100.00%. No results found. No results found for this basename: WBC, HGB, HCT, PLT,  in the last 72 hours No results found for this basename: NA, K, CL, CO, GLUCOSE, BUN, CREATININE, CALCIUM,  in the last 72 hours CBG (last 3)  No results found for this basename: GLUCAP,  in the last 72 hours  Wt Readings from Last 3 Encounters:  09/22/13 108.591 kg (239 lb 6.4 oz)  09/07/13 112 kg (246 lb 14.6 oz)  09/07/13 112 kg (246 lb 14.6 oz)    Physical Exam:  Constitutional: She is oriented to person, place, and time. She appears well-developed and well-nourished.  HENT: voice raspy Head: Normocephalic and atraumatic.  Eyes: Conjunctivae are normal. Pupils are equal, round, and reactive to light.  Neck: Neck supple.  Cardiovascular: Normal rate and regular rhythm.  Respiratory: Effort normal   No respiratory distress. She has a few wheezes and rhonchi GI: Soft. Bowel sounds are normal.  There is no tenderness. There is no rebound.  Musculoskeletal: She exhibits trace edema.  Neurological: She is alert and oriented to person, place, and time. Sensation a little improved over soles of feet   2-2+/5 at HF, Right KE 2- to 2, L knee 2 to 2+, right ankle 1 to 1+, left ankle 2-. dtr's trace to absent  Skin: Skin is warm and dry.  Small white area without drainage. Wound looks clean, well approximated Psychiatric: She has a normal mood and affect. Her behavior is normal. Judgment and thought content normal.    Assessment/Plan: 1. Functional deficits secondary to cauda equina syndrome which require 3+ hours per day of  interdisciplinary therapy in a comprehensive inpatient rehab setting. Physiatrist is providing close team supervision and 24 hour management of active medical problems listed below. Physiatrist and rehab team continue to assess barriers to discharge/monitor patient progress toward functional and medical goals.     FIM: FIM - Bathing Bathing Steps Patient Completed: Chest;Right Arm;Left Arm;Abdomen;Front perineal area;Buttocks;Right upper leg;Left upper leg;Right lower leg (including foot);Left lower leg (including foot) Bathing: 6: More than reasonable amount of time  FIM - Upper Body Dressing/Undressing Upper body dressing/undressing steps patient completed: Thread/unthread right bra strap;Thread/unthread left bra strap;Hook/unhook bra;Thread/unthread right sleeve of pullover shirt/dresss Upper body dressing/undressing: 6: Assistive device (Comment) FIM - Lower Body Dressing/Undressing Lower body dressing/undressing steps patient completed: Thread/unthread right underwear leg;Thread/unthread left underwear leg;Thread/unthread right pants leg;Thread/unthread left pants leg Lower body dressing/undressing: 3: Mod-Patient completed 50-74% of tasks  FIM - Toileting Toileting steps completed by patient: Adjust clothing prior to toileting;Performs perineal hygiene Toileting Assistive Devices: Grab bar or rail for support Toileting: 0: Activity did not occur  FIM - Diplomatic Services operational officer Devices: Psychiatrist Transfers: 0-Activity did not occur  FIM - Banker Devices: Arm rests;Orthosis Bed/Chair Transfer: 3: Chair or W/C > Bed: Mod A (lift or lower assist)  FIM - Locomotion: Wheelchair Distance: 180 Locomotion: Wheelchair: 5: Travels 150 ft or more: maneuvers on rugs and over door sills with supervision, cueing or coaxing FIM - Locomotion: Ambulation Locomotion: Ambulation Assistive Devices: Parallel  bars Ambulation/Gait Assistance:  3: Mod assist Locomotion: Ambulation: 1: Travels less than 50 ft with moderate assistance (Pt: 50 - 74%)  Comprehension Comprehension Mode: Auditory Comprehension: 7-Follows complex conversation/direction: With no assist  Expression Expression Mode: Verbal Expression: 7-Expresses complex ideas: With no assist  Social Interaction Social Interaction: 7-Interacts appropriately with others - No medications needed.  Problem Solving Problem Solving: 7-Solves complex problems: Recognizes & self-corrects  Memory Memory: 7-Complete Independence: No helper  Cauda equina syndrome with B/B issues.  1. DVT Prophylaxis/Anticoagulation: Pharmaceutical: Lovenox  -dopplers negative 2. Chronic back pain/Pain Management: prn medcications effective.  3. H/o depression with anxiety/Mood: continue to Provide ego support. 4. Neuropsych: This patient is capable of making decisions on her own behalf.  5. HTN: On HCTZ and cozaar. Decreased cozaar to 25 and set parameters on BP meds to avoid orthostasis.  -continue to hold hctz  -TEDS, adequate fluids. binder if needed  -overall improved 6. ABLA: hgb 9.5, continue iron supplement. Check labs tomorrow 7. Neurogenic Bowel and bladder: improving function -bowel meds--backing off regimen slightly  8. Wound: -improving, only small white, necrotic area---wound looks very clean, minimal to no drainage   -continue with dry dressing 9. Bronchitis--po levaquin, mucinex--contniue--improved  -encouraging fluids  -IS, FV, oob  -albuterol neb, decadron decreased to bid LOS (Days) 19 A FACE TO FACE EVALUATION WAS PERFORMED  Krishiv Sandler T 09/27/2013 7:27 AM

## 2013-09-27 NOTE — Plan of Care (Signed)
Problem: RH PAIN MANAGEMENT Goal: RH STG PAIN MANAGED AT OR BELOW PT'S PAIN GOAL 5 or less on scale 0-10  Outcome: Progressing Reports pain as 4-5

## 2013-09-27 NOTE — Progress Notes (Signed)
Occupational Therapy Session Note  Patient Details  Name: Sherry Wong MRN: [id number] Date of Birth: 07-Nov-1955  Today's Date: 09/27/2013 Time: 4098-1191 60 min  Short Term Goals: Week 3:  OT Short Term Goal 1 (Week 3): Patient will complete transfer from w/c to standard tub and tub bench with max assist to manage LE OT Short Term Goal 2 (Week 3): Patient will complete lower body dressing with min assist OT Short Term Goal 3 (Week 3): Patient will complete simple meal prep using LRAD with min assist OT Short Term Goal 4 (Week 3): Patient will demo ability to complete HEP for general strengthening independently  Skilled Therapeutic Interventions: ADL-retraining with emphasis on improved static/dynamic standing balance and lower body dressing.   Patient completes bathing using unassisted squat pivot transfer to bath bench and LH sponge to reach her feet.   Patient dresses upper body seated in w/c at sink side and uses her reacher to don underwear (brief used) and pants, bringing them up to her knees.   Using bedrail and mod assist (pt =90%) , patient is able to complete supported static standing while therapist pulls up underwear and pants.   Patient requires total assist to don BLE orthosis which prevent her from donning her shoes independently.   Patient toilets using bariatric DA commode and toilet aid (tongs).    Family (husband and pt's sister) arrived after treatment and were educated on patient's level of assistance needed for ADL; no concerns expressed or revealed during family training.    Therapy Documentation Precautions:  Precautions Precautions: Back;Fall Precaution Booklet Issued: Yes (comment) Precaution Comments: Pt able to recall 2 of 3 Restrictions Weight Bearing Restrictions: No  Vital Signs: Therapy Vitals Temp: 97.7 F (36.5 C) Temp src: Oral Pulse Rate: 74 Resp: 18 BP: 139/81 mmHg Patient Position, if appropriate: Standing Oxygen Therapy SpO2: 100 % O2  Device: None (Room air)  Pain: 4/10, low back, RN aware  See FIM for current functional status  Therapy/Group: Individual Therapy  Tarren Velardi 09/27/2013, 8:23 AM

## 2013-09-27 NOTE — Progress Notes (Addendum)
Physical Therapy Session Note  Patient Details  Name: Sherry Wong MRN: [id number] Date of Birth: [birth year]  Today's Date: 09/27/2013 Time: 0730-0830 Time Calculation (min): 60 min   Skilled Therapeutic Interventions/Progress Updates:    Session focused on home environment mobility at wheelchair level. Transfers to/from bed supervision, practiced positioning of wheelchair at end of bed to allow room to swing legs into bed. Pt needed leg lifter to get LEs into bed by self. Kitchen mobility with wheelchair, supervision cues for safety precautions and efficiency. Sit <> stands from wheelchair mod assist, 4 forwards steps with RW for a distance of ~ 2' with close min assist and wheelchair to follow. Ambulation goals D/Ced as pt does not appear to be ambulatory at D/C.   Therapy Documentation Precautions:  Precautions Precautions: Back;Fall Precaution Booklet Issued: Yes (comment) Precaution Comments: Pt able to recall 2 of 3 Restrictions Weight Bearing Restrictions: No Pain: Pain Assessment Pain Assessment: 0-10 Pain Score: 4  Pain Type: Acute pain;Surgical pain Pain Location: Back Pain Orientation: Lower repositioned  See FIM for current functional status  Therapy/Group: Individual Therapy  Wilhemina Bonito 09/27/2013, 12:16 PM

## 2013-09-28 ENCOUNTER — Inpatient Hospital Stay (HOSPITAL_COMMUNITY): Payer: BC Managed Care – PPO | Admitting: Occupational Therapy

## 2013-09-28 ENCOUNTER — Inpatient Hospital Stay (HOSPITAL_COMMUNITY): Payer: BC Managed Care – PPO

## 2013-09-28 ENCOUNTER — Inpatient Hospital Stay (HOSPITAL_COMMUNITY): Payer: BC Managed Care – PPO | Admitting: *Deleted

## 2013-09-28 DIAGNOSIS — S343XXA Injury of cauda equina, initial encounter: Secondary | ICD-10-CM

## 2013-09-28 DIAGNOSIS — K592 Neurogenic bowel, not elsewhere classified: Secondary | ICD-10-CM

## 2013-09-28 DIAGNOSIS — N319 Neuromuscular dysfunction of bladder, unspecified: Secondary | ICD-10-CM

## 2013-09-28 LAB — CBC
HCT: 32.9 % — ABNORMAL LOW (ref 36.0–46.0)
Hemoglobin: 10.7 g/dL — ABNORMAL LOW (ref 12.0–15.0)
MCH: 30.4 pg (ref 26.0–34.0)
MCHC: 32.5 g/dL (ref 30.0–36.0)
MCV: 93.5 fL (ref 78.0–100.0)
Platelets: 227 10*3/uL (ref 150–400)
RBC: 3.52 MIL/uL — ABNORMAL LOW (ref 3.87–5.11)
RDW: 14.9 % (ref 11.5–15.5)
WBC: 5.5 10*3/uL (ref 4.0–10.5)

## 2013-09-28 LAB — BASIC METABOLIC PANEL
BUN: 19 mg/dL (ref 6–23)
CO2: 27 mEq/L (ref 19–32)
Calcium: 9 mg/dL (ref 8.4–10.5)
Chloride: 101 mEq/L (ref 96–112)
Creatinine, Ser: 0.64 mg/dL (ref 0.50–1.10)
GFR calc Af Amer: >90 mL/min (ref >90)
GFR calc non Af Amer: >90 mL/min (ref >90)
Glucose, Bld: 115 mg/dL — ABNORMAL HIGH (ref 70–99)
Potassium: 4.4 mEq/L (ref 3.7–5.3)
Sodium: 139 mEq/L (ref 137–147)

## 2013-09-28 MED ORDER — DEXAMETHASONE 4 MG PO TABS: ORAL_TABLET | ORAL | Status: DC

## 2013-09-28 MED ORDER — ALBUTEROL SULFATE (2.5 MG/3ML) 0.083% IN NEBU: 2.5000 mg | INHALATION_SOLUTION | RESPIRATORY_TRACT | Status: DC | PRN

## 2013-09-28 MED ORDER — OXYCODONE-ACETAMINOPHEN 10-325 MG PO TABS: 0.5000 | ORAL_TABLET | Freq: Four times a day (QID) | ORAL | Status: DC | PRN

## 2013-09-28 MED ORDER — GUAIFENESIN ER 600 MG PO TB12: 1200.0000 mg | ORAL_TABLET | Freq: Two times a day (BID) | ORAL | Status: DC

## 2013-09-28 MED ORDER — TAMSULOSIN HCL 0.4 MG PO CAPS: 0.4000 mg | ORAL_CAPSULE | Freq: Every day | ORAL | Status: DC

## 2013-09-28 MED ORDER — BISACODYL 10 MG RE SUPP: 10.0000 mg | RECTAL | Status: DC

## 2013-09-28 MED ORDER — POTASSIUM CHLORIDE CRYS ER 20 MEQ PO TBCR: 20.0000 meq | EXTENDED_RELEASE_TABLET | Freq: Every day | ORAL | Status: DC

## 2013-09-28 MED ORDER — LOSARTAN POTASSIUM 25 MG PO TABS: 25.0000 mg | ORAL_TABLET | Freq: Every day | ORAL | Status: DC

## 2013-09-28 MED ORDER — ALPRAZOLAM 0.25 MG PO TABS: 0.2500 mg | ORAL_TABLET | Freq: Two times a day (BID) | ORAL | Status: DC | PRN

## 2013-09-28 MED ORDER — SENNOSIDES-DOCUSATE SODIUM 8.6-50 MG PO TABS: 2.0000 | ORAL_TABLET | Freq: Every day | ORAL | Status: DC

## 2013-09-28 MED ORDER — ACETAMINOPHEN 325 MG PO TABS: 325.0000 mg | ORAL_TABLET | ORAL | Status: DC | PRN

## 2013-09-28 MED ORDER — METHOCARBAMOL 500 MG PO TABS: 500.0000 mg | ORAL_TABLET | Freq: Four times a day (QID) | ORAL | Status: DC | PRN

## 2013-09-28 NOTE — Progress Notes (Signed)
Subjective/Complaints: No new complaints. Anxious to get home. Cough better. A 12 point review of systems has been performed and if not noted above is otherwise negative.   Objective: Vital Signs: Blood pressure 119/77, pulse 79, temperature 97.9 F (36.6 C), temperature source Oral, resp. rate 20, height 5\' 8"  (1.727 m), weight 108.591 kg (239 lb 6.4 oz), SpO2 99.00%. No results found. No results found for this basename: WBC, HGB, HCT, PLT,  in the last 72 hours No results found for this basename: NA, K, CL, CO, GLUCOSE, BUN, CREATININE, CALCIUM,  in the last 72 hours CBG (last 3)  No results found for this basename: GLUCAP,  in the last 72 hours  Wt Readings from Last 3 Encounters:  09/22/13 108.591 kg (239 lb 6.4 oz)  09/07/13 112 kg (246 lb 14.6 oz)  09/07/13 112 kg (246 lb 14.6 oz)    Physical Exam:  Constitutional: She is oriented to person, place, and time. She appears well-developed and well-nourished.  HENT: voice raspy Head: Normocephalic and atraumatic.  Eyes: Conjunctivae are normal. Pupils are equal, round, and reactive to light.  Neck: Neck supple.  Cardiovascular: Normal rate and regular rhythm.  Respiratory: Effort normal   No respiratory distress. She has a few wheezes and rhonchi GI: Soft. Bowel sounds are normal.  There is no tenderness. There is no rebound.  Musculoskeletal: She exhibits trace edema.  Neurological: She is alert and oriented to person, place, and time. Sensation a little improved over soles of feet   2-2+/5 at HF, Right KE 2- to 2, L knee 2 to 2+, right ankle 1 to 1+, left ankle 2-. dtr's trace to absent  Skin: Skin is warm and dry.  Small white area without drainage, second smaller area just underneath. No fluctuance, pain, erythema. Wound looks clean, well approximated otherwise Psychiatric: She has a normal mood and affect. Her behavior is normal. Judgment and thought content normal.    Assessment/Plan: 1. Functional deficits secondary  to cauda equina syndrome which require 3+ hours per day of interdisciplinary therapy in a comprehensive inpatient rehab setting. Physiatrist is providing close team supervision and 24 hour management of active medical problems listed below. Physiatrist and rehab team continue to assess barriers to discharge/monitor patient progress toward functional and medical goals.     FIM: FIM - Bathing Bathing Steps Patient Completed: Chest;Right Arm;Left Arm;Abdomen;Front perineal area;Right upper leg;Buttocks;Left upper leg;Right lower leg (including foot);Left lower leg (including foot) Bathing: 6: Assistive device (Comment)  FIM - Upper Body Dressing/Undressing Upper body dressing/undressing steps patient completed: Thread/unthread right bra strap;Thread/unthread left bra strap;Thread/unthread right sleeve of pullover shirt/dresss;Thread/unthread left sleeve of pullover shirt/dress;Put head through opening of pull over shirt/dress;Pull shirt over trunk Upper body dressing/undressing: 4: Min-Patient completed 75 plus % of tasks FIM - Lower Body Dressing/Undressing Lower body dressing/undressing steps patient completed: Thread/unthread right underwear leg;Thread/unthread left underwear leg;Thread/unthread right pants leg;Thread/unthread left pants leg Lower body dressing/undressing: 3: Mod-Patient completed 50-74% of tasks  FIM - Toileting Toileting steps completed by patient: Adjust clothing prior to toileting;Performs perineal hygiene Toileting Assistive Devices: Grab bar or rail for support Toileting: 0: Activity did not occur  FIM - Diplomatic Services operational officer Devices: Psychiatrist Transfers: 0-Activity did not occur  FIM - Banker Devices: Arm rests;Orthosis Bed/Chair Transfer: 5: Bed > Chair or W/C: Supervision (verbal cues/safety issues);5: Chair or W/C > Bed: Supervision (verbal cues/safety issues);5: Supine > Sit:  Supervision (verbal cues/safety issues);5: Sit > Supine: Supervision (  verbal cues/safety issues)  FIM - Locomotion: Wheelchair Distance: 200 Locomotion: Wheelchair: 6: Travels 150 ft or more, turns around, maneuvers to table, bed or toilet, negotiates 3% grade: maneuvers on rugs and over door sills independently FIM - Locomotion: Ambulation Locomotion: Ambulation Assistive Devices: Designer, industrial/product Ambulation/Gait Assistance: 4: Min assist Locomotion: Ambulation: 1: Travels less than 50 ft with minimal assistance (Pt.>75%)  Comprehension Comprehension Mode: Auditory Comprehension: 7-Follows complex conversation/direction: With no assist  Expression Expression Mode: Verbal Expression: 7-Expresses complex ideas: With no assist  Social Interaction Social Interaction: 6-Interacts appropriately with others with medication or extra time (anti-anxiety, antidepressant).  Problem Solving Problem Solving: 7-Solves complex problems: Recognizes & self-corrects  Memory Memory: 7-Complete Independence: No helper  Cauda equina syndrome with B/B issues.  1. DVT Prophylaxis/Anticoagulation: Pharmaceutical: Lovenox  -dopplers negative 2. Chronic back pain/Pain Management: prn medcications effective.  3. H/o depression with anxiety/Mood: continue to Provide ego support. 4. Neuropsych: This patient is capable of making decisions on her own behalf.  5. HTN: On HCTZ and cozaar. Decreased cozaar to 25 and set parameters on BP meds to avoid orthostasis.  -continue to hold hctz  -TEDS, adequate fluids. binder if needed  -overall improved 6. ABLA: hgb 9.5, continue iron supplement. Check labs tomorrow 7. Neurogenic Bowel and bladder: improving function -bowel meds--backing off regimen slightly  8. Wound: -improving, only small white, necrotic area---wound looks very clean, minimal to no drainage   -continue with dry dressing--will have WOC RN come by for wound care recs before dc  -rec surgery follow  up next week as well 9. Bronchitis--po levaquin, mucinex--contniue--improved  -encouraging fluids  -IS, FV, oob  -albuterol neb, decadron decreased to bid--wean over next week or so LOS (Days) 20 A FACE TO FACE EVALUATION WAS PERFORMED  Zykiria Bruening T 09/28/2013 7:47 AM

## 2013-09-28 NOTE — Discharge Summary (Signed)
Physician Discharge Summary  Patient ID: Sherry Wong MRN: [id number] DOB/AGE: July 31, 1956 57 y.o.  Admit date: 09/08/2013 Discharge date: 09/29/2013  Discharge Diagnoses:  Principal Problem:   Cauda equina syndrome Active Problems:   Lumbar degenerative disc disease   HTN (hypertension)   Hypokalemia   Depressive disorder, not elsewhere classified   Bronchitis, chronic with acute exacerbation   Acute blood loss anemia   Discharged Condition:  Improved   Significant Diagnostic Studies: N/A   Labs:  Basic Metabolic Panel:    Component Value Date/Time   NA 139 09/28/2013 0750   K 4.4 09/28/2013 0750   CL 101 09/28/2013 0750   CO2 27 09/28/2013 0750   GLUCOSE 115* 09/28/2013 0750   BUN 19 09/28/2013 0750   CREATININE 0.64 09/28/2013 0750   CALCIUM 9.0 09/28/2013 0750   GFRNONAA >90 09/28/2013 0750   GFRAA >90 09/28/2013 0750     CBC:    Component Value Date/Time   WBC 5.5 09/28/2013 0750   RBC 3.52* 09/28/2013 0750   HGB 10.7* 09/28/2013 0750   HCT 32.9* 09/28/2013 0750   PLT 227 09/28/2013 0750   MCV 93.5 09/28/2013 0750   MCH 30.4 09/28/2013 0750   MCHC 32.5 09/28/2013 0750   RDW 14.9 09/28/2013 0750   LYMPHSABS 1.3 09/09/2013 0535   MONOABS 0.6 09/09/2013 0535   EOSABS 0.0 09/09/2013 0535   BASOSABS 0.0 09/09/2013 0535     Brief HPI:   Sherry Wong is a 57 y.o. female with history of HTN, chronic back pain with radiation to BLE due to L4-5 and L5-S1 DDD. Patient elected to undergo L4-5 lam and L4/5 and L5/S1 disectomy with decompression of thecal sac on 08/25/13. Post op with paraparesis due to CSF leak and patient taken back to OR later that evening for exploration of wound with evacuation of epidural hematoma. Post op ABLA noted with hgb-8.6. On bedrest till 08/30/13 and now able to to flex knees, has flickering movement in toes and has had improvement in sensation BLE. Urine output improving and BP meds on hold due to hypotension. On 12/03, patient  with seizure episode and was found to have CSF leak therefore patient taken to OR for wound debridement and lumbar drain placement. CT head without acute abnormality. She was placed on bedrest and lumbar drain d/c 12/07. HOB being slowly increased    Hospital Course: Sherry Wong was admitted to rehab 09/08/2013 for inpatient therapies to consist of PT, ST and OT at least three hours five days a week. Past admission physiatrist, therapy team and rehab RN have worked together to provide customized collaborative inpatient rehab. Blood pressures have been monitored on bid basis for signs of orthostasis. HCTZ was discontinued and cozaar was decreased to 25 mg daily.  She was started on potassium supplement as labs revealed K+ down to 2.5.  Recheck labs show resolution and she is to have follow up lytes rechecked at primary care in a couple of weeks. ABLA has been monitored and is slowly resolving. She has had bronchitis flare which was treated with Levaquin as well as increase in dose of steroids. Nebs and mucinex were added additionally to help with overall symptoms.  Respiratory status had greatly improved by discharge. She will continue slow wean off decadron past discharge.   Bowel and bladder program were initiated past admission. Aggressive bowel program with daily suppositories as well as urecholine was added to help with symptoms. She was voiding without difficulty at discharge and urecholine  was discontinued. She is to continue on po laxatives with prn use of suppository past discharge. Back incision has been monitored on tid basis initially due to moderate amount of serosanguinous wound drainage. She was treated with short course of cipro for wound prophylaxis. Staples were discontinued without difficulty and drainage had almost resolved by discharge. She has had two small areas of wound dehiscence and should heal by secondary intention.  She is to cleanse this area with soap and water and cover with  dry dressing for now. Paraparesis is improving with LLE at 2+/5 at hip, 5/5 knee extension, 3+/5 knee flexion, trace dorsiflexion and RLE 2+ hip flexion (secondary to back pain), 4+/5 knee extension, 3+ knee flexion, trace dorsiflexion. She will continue to receive home health therapies past discharge.   Rehab course: During patient's stay in rehab weekly team conferences were held to monitor patient's progress, set goals and discuss barriers to discharge. Patient has had improvement in activity tolerance, balance, postural control, as well as ability to compensate for deficits. She is modified independent at wheelchair level. She is independent for transfers and bed mobility.  She is able to perform car transfers with supervision. She is independent for bathing tasks with use of AE. She requires assistance with lower body dressing tasks. Family education was done with sister and husband who will provide assistance as needed past discharge.    Disposition: Home  Diet: Regular  Special Instructions: 1. Routine back precautions.  2. No driving. 3. Keep a dry dressing on back incision. Contact Dr. Jeral Fruit if you develop fever, chills, purulent drainage or redness around the wound.         Future Appointments Provider Department Dept Phone   10/26/2013 10:00 AM Ranelle Oyster, MD [facility] Physical Medicine and Rehabilitation [phone]        Medication List    STOP taking these medications       diazepam 5 MG tablet  Commonly known as:  VALIUM     HYDROcodone-acetaminophen 10-325 MG per tablet  Commonly known as:  NORCO     losartan-hydrochlorothiazide 50-12.5 MG per tablet  Commonly known as:  HYZAAR      TAKE these medications       acetaminophen 325 MG tablet  Commonly known as:  TYLENOL  Take 1-2 tablets (325-650 mg total) by mouth every 4 (four) hours as needed for mild pain.     albuterol 108 (90 BASE) MCG/ACT inhaler  Commonly known as:  PROVENTIL HFA;VENTOLIN HFA   Inhale 2 puffs into the lungs 2 (two) times daily.     ALPRAZolam 0.25 MG tablet  Rx #15 pills  Commonly known as:  XANAX  Take 1 tablet (0.25 mg total) by mouth 2 (two) times daily as needed for anxiety.     bisacodyl 10 MG suppository  Commonly known as:  DULCOLAX  Place 1 suppository (10 mg total) rectally daily. Available over the counter--bowel program     citalopram 20 MG tablet  Commonly known as:  CELEXA  Take 20 mg by mouth daily.     dexamethasone 4 MG tablet  Commonly known as:  DECADRON  - One pill twice a day for a week.  - Then decrease to one pill daily till gone,.     guaiFENesin 600 MG 12 hr tablet  Commonly known as:  MUCINEX  Take 2 tablets (1,200 mg total) by mouth 2 (two) times daily. For congestion---over the counter     hyoscyamine 0.125 MG  tablet  Commonly known as:  LEVSIN, ANASPAZ  Take 0.125 mg by mouth 3 (three) times daily.     levothyroxine 150 MCG tablet  Commonly known as:  SYNTHROID, LEVOTHROID  Take 150 mcg by mouth daily.     losartan 25 MG tablet  Commonly known as:  COZAAR  Take 1 tablet (25 mg total) by mouth daily. New medication for BP     methocarbamol 500 MG tablet  Commonly known as:  ROBAXIN  Take 1 tablet (500 mg total) by mouth every 6 (six) hours as needed for muscle spasms.     oxyCODONE-acetaminophen 10-325 MG per tablet Rx # 60 pills  Commonly known as:  PERCOCET  Take 0.5-1 tablets by mouth every 6 (six) hours as needed for pain (for severe pain).     pantoprazole 40 MG tablet  Commonly known as:  PROTONIX  Take 40 mg by mouth daily.     potassium chloride SA 20 MEQ tablet  Commonly known as:  K-DUR,KLOR-CON  Take 1 tablet (20 mEq total) by mouth daily.     senna-docusate 8.6-50 MG per tablet  Commonly known as:  Senokot-S  Take 2 tablets by mouth at bedtime. Bowel program--over the counter     tamsulosin 0.4 MG Caps capsule  Commonly known as:  FLOMAX  Take 1 capsule (0.4 mg total) by mouth daily after  supper. To help bladder fuction       Follow-up Information   Follow up with Ranelle Oyster, MD On 10/26/2013. (Be there at 9:30 for 10 am appointment)    Specialty:  Physical Medicine and Rehabilitation   Contact information:   [address], [address] [place] [address] [phone]       Follow up with Karn Cassis, MD. Call on 10/07/2013. (Be there for follow up appointment at 1:15 pm. )    Specialty:  Neurosurgery   Contact information:   [address] [place] [address] [phone]       Follow up with [facility], PA-C On 10/11/2013. (@ 3pm. Post hospital check. Recheck lytes--decide need to continue Kdur. )    Specialty:  Physician Assistant   Contact information:   [address] [place] [address]       Signed: Jacquelynn Cree 09/28/2013, 3:54 PM

## 2013-09-28 NOTE — Progress Notes (Signed)
Physical Therapy Discharge Summary  Patient Details  Name: Sherry Wong MRN: [id number] Date of Birth: 20-Sep-1956  Today's Date: 09/28/2013 Time: 1000-1048 Time Calculation (min): 48 min  Patient has met 6 of 6 long term goals due to improved activity tolerance, improved balance, improved postural control, increased strength, decreased pain, ability to compensate for deficits and functional use of  right lower extremity and left lower extremity.  Patient to discharge at a wheelchair level Modified Independent for w/c mobility, bed mobility, and basic transfers, supervision for car transfers.   Patient's care partner is independent to provide the necessary supervision assistance at discharge.  Reasons goals not met: All long term goals met.  Recommendation:  Patient will benefit from ongoing skilled PT services in home health setting to continue to advance safe functional mobility, address ongoing impairments in BLE weakness, balance, gait, endurance and minimize fall risk.  Equipment: 20"x18" wheelchair, basic wheelchair cushion, rolling walker  Reasons for discharge: discharge from hospital  Patient/family agrees with progress made and goals achieved: Yes  Skilled Therapeutic Intervention: Session focus on assessing goals and providing pt education for discharge home. Began with passive hip adductor stretching per pt request to decrease tightness and pain. Pt performed w/c<>mat transfer and bed mobility mod I. Pt performed w/c mobility including up and down ramp x2 and management of parts mod I. Pt performed car transfer with supervision with single verbal cue to bring both legs out of car before attempting lateral scoot transfer. Reassessed strength and sensation, details below. Pt stated she had no further questions or concerns before discharge.   PT Discharge Precautions/Restrictions Precautions Precautions: Back;Fall Restrictions Weight Bearing Restrictions: No Pain  Pt c/o  pain and tightness both inner thighs. Provided passive adductor stretch, pt reported relief of pain.  Vision/Perception  Perception Perception: Within Functional Limits Praxis Praxis: Intact  Cognition Overall Cognitive Status: Within Functional Limits for tasks assessed Sensation Sensation Light Touch: Impaired Detail Light Touch Impaired Details: Impaired RLE;Impaired LLE Stereognosis: Not tested Hot/Cold: Appears Intact Proprioception: Not tested Additional Comments: Diminished sensation BLE R>L posterior thigh, entire lower leg. Pt reports tingling in bilat toes and minimal sensation plantar surface of feet Motor  Motor Motor: Abnormal tone (paraparesis) Motor - Discharge Observations: Increased tone bilateral hip adductors, weakness and impaired functional mobility of BLE secondary to cauda equina syndrome  Mobility Bed Mobility Bed Mobility: Supine to Sit;Sit to Supine Supine to Sit: 6: Modified independent (Device/Increase time) Sit to Supine: 6: Modified independent (Device/Increase time) Transfers Transfers: Yes Lateral/Scoot Transfers: 6: Modified independent (Device/Increase time) Locomotion  Ambulation Ambulation: No Ambulation/Gait Assistance: Not tested (comment) Gait Gait: No Stairs / Additional Locomotion Stairs: No Ramp: 6: Modified independent (Device) (wheelchair) Corporate treasurer: Yes Wheelchair Assistance: 6: Modified independent (Device/Increase time) Occupational hygienist: Both upper extremities Wheelchair Parts Management: Independent Distance: 200  Trunk/Postural Assessment  Cervical Assessment Cervical Assessment: Within Functional Limits Thoracic Assessment Thoracic Assessment: Within Functional Limits Lumbar Assessment Lumbar Assessment: Exceptions to [facility]  Balance Static Sitting Balance Static Sitting - Balance Support: Feet supported Static Sitting - Level of Assistance: 6: Modified independent (Device/Increase  time) Dynamic Sitting Balance Dynamic Sitting - Balance Support: Feet supported Dynamic Sitting - Level of Assistance: 6: Modified independent (Device/Increase time) Extremity Assessment  RUE Assessment RUE Assessment: Within Functional Limits LUE Assessment LUE Assessment: Within Functional Limits RLE Assessment RLE Assessment: Exceptions to [facility] RLE Strength RLE Overall Strength Comments: 2+ hip flexion (secondary to back pain), 4+/5 knee extension, 3+ knee flexion, trace  dorsiflexion LLE Assessment LLE Assessment: Exceptions to [facility] LLE Strength LLE Overall Strength Comments: 2+/5 hip flexion, 5/5 knee extension, 3+/5 knee flexion, trace dorsiflexion  See FIM for current functional status  Lavell Supple, [facility] 09/28/2013, 11:07 AM

## 2013-09-28 NOTE — Progress Notes (Signed)
Occupational Therapy Session Note  Patient Details  Name: Sherry Wong MRN: [id number] Date of Birth: [birth year]  Today's Date: 09/28/2013 Time: 4098-1191 Time Calculation (min): 34 min  Short Term Goals: Week 2:  OT Short Term Goal 1 (Week 2): Patient will complete transfer from w/c to standard tub and tub bench with max assist to manage LE OT Short Term Goal 1 - Progress (Week 2): Progressing toward goal OT Short Term Goal 2 (Week 2): Patient will complete lower body bathing with min assist to maintain back precautions OT Short Term Goal 2 - Progress (Week 2): Met OT Short Term Goal 3 (Week 2): Patient will complete lower body dressing with min assist OT Short Term Goal 3 - Progress (Week 2): Progressing toward goal OT Short Term Goal 4 (Week 2): Patient will complete simple meal prep using LRAD with min assist OT Short Term Goal 4 - Progress (Week 2): Progressing toward goal OT Short Term Goal 5 (Week 2): Patient will demo ability to complete HEP for general strengthening independently OT Short Term Goal 5 - Progress (Week 2): Progressing toward goal  Skilled Therapeutic Interventions/Progress Updates:    1:1 focus on w/c setup for transfers, transferred to bed to practice bed mobility without rails while maintaining back precautions with leg lifter prn, doffing and donning shoes with braces,  doffing and then donning pants with reacher (with extra time) in supine with head of bed slightly elevated.   Therapy Documentation Precautions:  Precautions Precautions: Back;Fall Precaution Booklet Issued: Yes (comment) Precaution Comments: Pt able to recall 2 of 3 Restrictions Weight Bearing Restrictions: No Pain: No pain reported  See FIM for current functional status  Therapy/Group: Individual Therapy  Roney Mans [facility] 09/28/2013, 1:13 PM

## 2013-09-28 NOTE — Progress Notes (Signed)
Occupational Therapy Session Note  Patient Details  Name: KLARYSSA FAUTH MRN: [id number] Date of Birth: March 06, 1956  Today's Date: 09/28/2013 Time: 0830-0930 Time Calculation (min): 60 min  Short Term Goals: Week 3:  OT Short Term Goal 1 (Week 3): Patient will complete transfer from w/c to standard tub and tub bench with max assist to manage LE OT Short Term Goal 2 (Week 3): Patient will complete lower body dressing with min assist OT Short Term Goal 3 (Week 3): Patient will complete simple meal prep using LRAD with min assist OT Short Term Goal 4 (Week 3): Patient will demo ability to complete HEP for general strengthening independently  Skilled Therapeutic Interventions: ADL-retraining with emphasis on transfer to standard tub, bed mobility, use of AE for LB ADL, and discharge planning. Patient completed bed mobility and transfers to w/c and to [facility] over toilet and to standard tub and tub bench with distant supervision this session, managing her RLE independently with improved flexion at right hip providing improved mobility and performance in transfers.   Patient completed toilet transfer uisng BSC over toilet and grab bar unassisted and completed toilet hygiene using tongs and flushable wipes.  Pt was able to bathe seated in tub bench using AE, as needed, to include using LH sponge to cleans buttocks.   Patient is unable to pull up her pants seated or standing due to LE weakness and requires assist with donning underwear and pants as well as BLE orthotics.   OT reiterated recommendation to practice donning underwear or diaper and pants supine in bed d/t pt's hx of IBS and episodes of diarrhea.   Patient completes bed mobility unassisted although she benefits from use of bedrails and HOB elevated at times to conserve energy and to maintain back precautions.   Patient returned to her room and completes grooming seated in w/c at sink.  Therapy Documentation Precautions:  Precautions Precautions:  Back;Fall Precaution Booklet Issued: Yes (comment) Precaution Comments: Pt able to recall 2 of 3 Restrictions Weight Bearing Restrictions: No  Vital Signs: Therapy Vitals Temp: 97.9 F (36.6 C) Temp src: Oral Pulse Rate: 79 Resp: 20 BP: 119/77 mmHg Patient Position, if appropriate: Lying Oxygen Therapy SpO2: 99 % O2 Device: None (Room air)  Pain: Pain Assessment Pain Assessment: 0-10 Pain Score: 5  Pain Type: Acute pain Pain Location: Back Pain Orientation: Lower Pain Descriptors / Indicators: Aching;Burning;Constant Pain Frequency: Constant Patients Stated Pain Goal: 3 Pain Intervention(s): Medication (See eMAR)  See FIM for current functional status  Therapy/Group: Individual Therapy  Berdia Lachman 09/28/2013, 9:45 AM

## 2013-09-28 NOTE — Progress Notes (Signed)
Patient ID: Sherry Wong, female   DOB: 06-01-1956, 57 y.o.   MRN: [id number] Home tomorrow. Home pt . Wound healing with 2 spots healing by secondary intention. i will call her at home.

## 2013-09-28 NOTE — Progress Notes (Signed)
Physical Therapy Session Note  Patient Details  Name: Sherry Wong MRN: [id number] Date of Birth: Jan 04, 1956  Today's Date: 09/28/2013 Time: 4098-1191 Time Calculation (min): 32 min  Short Term Goals: Week 2:  PT Short Term Goal 1 (Week 2): Scoot transfers to/from level surfaces with supervision. PT Short Term Goal 1 - Progress (Week 2): Met PT Short Term Goal 2 (Week 2): Sit <> stand from wheelchair with moderate assistance PT Short Term Goal 2 - Progress (Week 2): Met PT Short Term Goal 3 (Week 2): Ambulation x 10' with RW and mod assist on consistant basis PT Short Term Goal 3 - Progress (Week 2): Not met  Skilled Therapeutic Interventions/Progress Updates:    Patient received sitting upright in bed. Patient reports "I'm worn out" and declines going to therapy gym. Patient agreeable to return demonstrate HEP and stretching. Patient performed supine LE therex, x10 reps each. Assisted patient with LE stretching, directed by patient, stretching of B hamstrings, gastroc/soleus, hip IR/ER. Patient left supine in bed with all needs within reach.  Therapy Documentation Precautions:  Precautions Precautions: Back;Fall Restrictions Weight Bearing Restrictions: No Pain: Pain Assessment Pain Assessment: No/denies pain Pain Score: 0-No pain  See FIM for current functional status  Therapy/Group: Individual Therapy  [name], PT, DPT 09/28/2013, 2:51 PM

## 2013-09-28 NOTE — Consult Note (Signed)
WOC wound consult note Reason for Consult: Consult requested by Dr Riley Kill to assess back wound.  Pt had lumbar surgery in early December.  Has been followed by surgeon for assessment and plan of care. Wound type: Full thickness post-op wound Measurement:.2X.2X.2cm and .3X.3X.2cm both to lower back and separated by thin strip of skin between wounds. Wound bed: Both sites 100% yellow slough Drainage (amount, consistency, odor) mod amt yellow drainage, no odor, fluctuant when probed with a swab Periwound: intact skin surrounding Dressing procedure/placement/frequency:  Discussed plan of care with Dr Riley Kill.  Recommend surgeon re-assess site and provide further plan of care. Foam dressing to protect site until further input available. Please re-consult if further assistance is needed.  Thank-you,  Cammie Mcgee MSN, RN, CWOCN, [place], CNS [phone]

## 2013-09-28 NOTE — Progress Notes (Signed)
Reviewed and in agreement the above assessment and treatment.  Tedd Sias

## 2013-09-29 DIAGNOSIS — N319 Neuromuscular dysfunction of bladder, unspecified: Secondary | ICD-10-CM

## 2013-09-29 DIAGNOSIS — K592 Neurogenic bowel, not elsewhere classified: Secondary | ICD-10-CM

## 2013-09-29 DIAGNOSIS — S343XXA Injury of cauda equina, initial encounter: Secondary | ICD-10-CM

## 2013-09-29 NOTE — Patient Care Conference (Signed)
Inpatient RehabilitationTeam Conference and Plan of Care Update Date: 09/28/2013   Time: 2:05 PM    Patient Name: Sherry Wong      Medical Record Number: [id number]  Date of Birth: June 14, 1956 Sex: Female         Room/Bed: 4W02C/4W02C-01 Payor Info: Payor: BLUE CROSS BLUE SHIELD / Plan: BCBS PPO OUT OF STATE / Product Type: *No Product type* /    Admitting Diagnosis: CAUDA EQUINA SYNDROME  Admit Date/Time:  09/08/2013  4:00 PM Admission Comments: No comment available   Primary Diagnosis:  Cauda equina syndrome Principal Problem: Cauda equina syndrome  Patient Active Problem List   Diagnosis Date Noted  . Hypokalemia 09/20/2013  . Depressive disorder, not elsewhere classified 09/20/2013  . Bronchitis, chronic with acute exacerbation 09/20/2013  . Acute blood loss anemia 09/20/2013  . Cauda equina syndrome 09/08/2013  . Morbid obesity 08/26/2013  . HTN (hypertension) 08/26/2013  . Hypoxemia 08/26/2013  . Unspecified hypothyroidism 08/26/2013  . Lumbar degenerative disc disease 08/25/2013    Expected Discharge Date: Expected Discharge Date: 09/29/13  Team Members Present: Physician leading conference: Dr. Faith Rogue Social Worker Present: Staci Acosta, LCSW Nurse Present: Keturah Barre, RN PT Present: Karolee Stamps, PT OT Present: Donzetta Kohut, OT;Kris Gellert, OT;Patricia McComb, OT SLP Present: Fae Pippin, SLP PPS Coordinator present : Tora Duck, RN, CRRN     Current Status/Progress Goal Weekly Team Focus  Medical   neurologically progressing. wound will still need follow up. bronchitis resolved  stabilize medically for dc  wound care, orthotics, resolution of bronchitis   Bowel/Bladder   Continent of B/B. LBM: 09/26/2013  Remains continent  Monitor for constipation/diarrhea   Swallow/Nutrition/ Hydration             ADL's   Supervision for transfers, Mod I for UB bathing/dressing, Min  A for LB  Mod I for UB ADL and transfers, Min A for LB ADL, Supervision  for homemaking  Static and dynamic standing balance, general strengthening, HEP instruction, skilled use of DME/AE to maintain back precautions   Mobility   mod I w/c level  mod I w/c level  goals met. ready for d/c   Communication             Safety/Cognition/ Behavioral Observations            Pain   Oxy 2 tabs at 2124 for back pain  < 3  Assess for pain Q shift; monitor effectiveness of pain med   Skin   Incision to back with yellow slough. Dry dressing applied.  No s/sx of infection; no new skin breakdown.  Assess for proper healing. Change dressing per order    Rehab Goals Patient on target to meet rehab goals: Yes Rehab Goals Revised: None *See Care Plan and progress notes for long and short-term goals.  Barriers to Discharge: no new barriers    Possible Resolutions to Barriers:  see prior    Discharge Planning/Teaching Needs:  Pt plans to go to her sister's home for about a month, where she will have better wheelchair accessibility.   Family has received family education and feel comfortable and confident in her care.   Team Discussion:  Pt is doing well medically and was made independent in her room by therapy.  Pt will need RN for back wound management.  Pt is on target for 09-29-13 d/c.  Revisions to Treatment Plan:  Pt's goals are all wheelchair level goals currently.    Continued Need for Acute  Rehabilitation Level of Care: The patient requires daily medical management by a physician with specialized training in physical medicine and rehabilitation for the following conditions: Daily direction of a multidisciplinary physical rehabilitation program to ensure safe treatment while eliciting the highest outcome that is of practical value to the patient.: Yes Daily medical management of patient stability for increased activity during participation in an intensive rehabilitation regime.: Yes Daily analysis of laboratory values and/or radiology reports with any subsequent  need for medication adjustment of medical intervention for : Pulmonary problems;Neurological problems;Post surgical problems  Yomaira Solar 10/01/2013, 9:07 AM

## 2013-09-29 NOTE — Progress Notes (Signed)
Occupational Therapy Discharge Summary  Patient Details  Name: Sherry Wong MRN: [id number] Date of Birth: July 15, 1956  Today's Date: 09/29/2013  Patient has met 6 of 7 long term goals due to improved activity tolerance, improved balance, postural control and ability to compensate for deficits.  Patient to discharge at [facility] Assist level.  Patient's care partner is independent to provide the necessary physical assistance at discharge to assist with donning LE orthotics and to assist with completion of lower body dressing tasks.    Reasons goals not met: Patient is unable to maintain dynamic standing balance as needed during ADL  Recommendation:  Patient will benefit from ongoing skilled OT services in home health setting to continue to advance functional skills in the area of BADL and iADL.  Equipment: DA Bariatric BSC, tub bench with peri cut-out purchased out-of-pocket  Reasons for discharge: discharge from hospital  Patient/family agrees with progress made and goals achieved: Yes  OT Discharge Precautions/Restrictions  Precautions Precautions: Back;Fall Precaution Booklet Issued: Yes (comment) Restrictions Weight Bearing Restrictions: No  Pain Pain Assessment Pain Assessment: No/denies pain Pain Score: 2  Pain Type: Acute pain Pain Location: Back Pain Orientation: Lower Pain Descriptors / Indicators: Aching Pain Frequency: Intermittent Pain Onset: Gradual Patients Stated Pain Goal: 2 Pain Intervention(s): Medication (See eMAR) Multiple Pain Sites: No  ADL ADL ADL Comments: see FIM, 09/28/13  Vision/Perception  Vision - History Baseline Vision: Wears glasses only for reading Vision - Assessment Eye Alignment: Within Functional Limits Perception Perception: Within Functional Limits Praxis Praxis: Intact   Cognition Overall Cognitive Status: Within Functional Limits for tasks assessed Arousal/Alertness: Awake/alert Orientation Level: Oriented  X4 Attention: Divided Divided Attention: Appears intact Memory: Appears intact Awareness: Appears intact Problem Solving: Appears intact Safety/Judgment: Appears intact  Sensation Sensation Light Touch: Impaired Detail Light Touch Impaired Details: Impaired RLE;Impaired LLE Stereognosis: Appears Intact Hot/Cold: Appears Intact Proprioception: Appears Intact Additional Comments: Diminished sensation BLE R>L posterior thigh, entire lower leg. Pt reports tingling in bilat toes and minimal sensation plantar surface of feet Coordination Gross Motor Movements are Fluid and Coordinated: Yes Fine Motor Movements are Fluid and Coordinated: Yes  Motor  Motor Motor - Discharge Observations: Increased tone bilateral hip adductors, weakness and impaired functional mobility of BLE secondary to cauda equina syndrome  Mobility  Bed Mobility Bed Mobility: Supine to Sit;Sit to Supine Rolling Right: 6: Modified independent (Device/Increase time) Sitting - Scoot to Edge of Bed: 7: Independent Sit to Supine: 6: Modified independent (Device/Increase time) Transfers Transfers: Sit to Stand;Stand to Sit Sit to Stand: 3: Mod assist Stand to Sit: 4: Min guard   Trunk/Postural Assessment  Cervical Assessment Cervical Assessment: Within Functional Limits Thoracic Assessment Thoracic Assessment: Within Functional Limits Lumbar Assessment Lumbar Assessment: Exceptions to [facility] Postural Control Postural Control: Deficits on evaluation Postural Limitations: due to back precautions   Balance Static Sitting Balance Static Sitting - Balance Support: Feet supported Static Sitting - Level of Assistance: 6: Modified independent (Device/Increase time) Dynamic Sitting Balance Dynamic Sitting - Balance Support: Feet supported Dynamic Sitting - Level of Assistance: 6: Modified independent (Device/Increase time) Dynamic Sitting - Balance Activities: Forward lean/weight shifting;Reaching for objects Static  Standing Balance Static Standing - Balance Support: Bilateral upper extremity supported Static Standing - Level of Assistance: 4: Min assist  Extremity/Trunk Assessment RUE Assessment RUE Assessment: Within Functional Limits LUE Assessment LUE Assessment: Within Functional Limits  See FIM for current functional status  Kebrina Friend 09/29/2013, 12:45 PM

## 2013-09-29 NOTE — Progress Notes (Signed)
Social Work Discharge Note  The overall goal for the admission was met for:   Discharge location: Yes - home  Length of Stay: Yes - 21 days  Discharge activity level: Yes - Modified Independent  Home/community participation: Yes  Services provided included: MD, RD, PT, OT, RN, Pharmacy and SW  Financial Services: Private Insurance: [employer] Blue Shield  Follow-up services arranged: Home Health: RN, PT, OT, DME: 20x18 wheelchair; rolling walker; drop arm commode; tub bench with perineal cutout and Patient/Family request agency HH: no preference DME: [facility]  Comments (or additional information):  Patient/Family verbalized understanding of follow-up arrangements: Yes  Individual responsible for coordination of the follow-up plan: pt, sister, and husband  Confirmed correct DME delivered: Elvera Lennox 09/29/2013    Florenda Watt, [place]

## 2013-09-29 NOTE — Progress Notes (Signed)
Pt. Got d/c papers,instructions and prescriptions.Pt. Ready to go home with husband

## 2013-09-29 NOTE — Progress Notes (Signed)
Subjective/Complaints: Mod I in room with out difficulty, appreciate Neurosurg note A 12 point review of systems has been performed and if not noted above is otherwise negative.   Objective: Vital Signs: Blood pressure 127/86, pulse 74, temperature 97.6 F (36.4 C), temperature source Oral, resp. rate 20, height 5\' 8"  (1.727 m), weight 108.591 kg (239 lb 6.4 oz), SpO2 98.00%. No results found.  Recent Labs  09/28/13 0750  WBC 5.5  HGB 10.7*  HCT 32.9*  PLT 227    Recent Labs  09/28/13 0750  NA 139  K 4.4  CL 101  GLUCOSE 115*  BUN 19  CREATININE 0.64  CALCIUM 9.0   CBG (last 3)  No results found for this basename: GLUCAP,  in the last 72 hours  Wt Readings from Last 3 Encounters:  09/22/13 108.591 kg (239 lb 6.4 oz)  09/07/13 112 kg (246 lb 14.6 oz)  09/07/13 112 kg (246 lb 14.6 oz)    Physical Exam:  Constitutional: She is oriented to person, place, and time. She appears well-developed and well-nourished.  HENT: voice raspy Head: Normocephalic and atraumatic.  Eyes: Conjunctivae are normal. Pupils are equal, round, and reactive to light.  Neck: Neck supple.  Cardiovascular: Normal rate and regular rhythm.  Respiratory: Effort normal   No respiratory distress. She has a few wheezes and rhonchi GI: Soft. Bowel sounds are normal.  There is no tenderness. There is no rebound.  Musculoskeletal: She exhibits trace edema.  Neurological: She is alert and oriented to person, place, and time. Sensation a little improved over soles of feet   2-2+/5 at HF, Right KE 2- to 2, L knee 2 to 2+, right ankle 1 to 1+, left ankle 2-. dtr's trace to absent  Skin: Skin is warm and dry.  Small white area without drainage, second smaller area just underneath. No fluctuance, pain, erythema. Wound looks clean, well approximated otherwise Psychiatric: She has a normal mood and affect. Her behavior is normal. Judgment and thought content normal.    Assessment/Plan: 1. Functional  deficits secondary to cauda equina syndrome  Stable for D/C today F/u PCP in 1-2 weeks F/u NS F/u PM&R 3 weeks See D/C summary See D/C instructions    FIM: FIM - Bathing Bathing Steps Patient Completed: Chest;Right Arm;Left Arm;Abdomen;Front perineal area;Buttocks;Right upper leg;Left upper leg;Right lower leg (including foot);Left lower leg (including foot) Bathing: 6: Assistive device (Comment) (LH sponge )  FIM - Upper Body Dressing/Undressing Upper body dressing/undressing steps patient completed: Thread/unthread right bra strap;Thread/unthread left bra strap;Hook/unhook bra;Thread/unthread right sleeve of pullover shirt/dresss;Thread/unthread left sleeve of pullover shirt/dress;Put head through opening of pull over shirt/dress;Pull shirt over trunk Upper body dressing/undressing: 6: Assistive device (Comment) FIM - Lower Body Dressing/Undressing Lower body dressing/undressing steps patient completed: Thread/unthread right underwear leg;Thread/unthread left underwear leg;Thread/unthread right pants leg;Thread/unthread left pants leg Lower body dressing/undressing: 3: Mod-Patient completed 50-74% of tasks  FIM - Toileting Toileting steps completed by patient: Adjust clothing prior to toileting;Performs perineal hygiene Toileting Assistive Devices: Grab bar or rail for support;Toilet Aid/prosthesis/orthosis (tongs) Toileting: 6: Assistive device: No helper  FIM - Diplomatic Services operational officer Devices: Bedside commode;Grab bars Toilet Transfers: 6-To toilet/ BSC;6-From toilet/BSC  FIM - Banker Devices: Arm rests Bed/Chair Transfer: 0: Activity did not occur  FIM - Locomotion: Wheelchair Distance: 200 Locomotion: Wheelchair: 0: Activity did not occur FIM - Locomotion: Ambulation Locomotion: Ambulation Assistive Devices: Designer, industrial/product Ambulation/Gait Assistance: Not tested (comment) Locomotion: Ambulation: 0: Activity  did  not occur  Comprehension Comprehension Mode: Auditory Comprehension: 7-Follows complex conversation/direction: With no assist  Expression Expression Mode: Verbal Expression: 7-Expresses complex ideas: With no assist  Social Interaction Social Interaction: 7-Interacts appropriately with others - No medications needed.  Problem Solving Problem Solving: 7-Solves complex problems: Recognizes & self-corrects  Memory Memory: 7-Complete Independence: No helper  Cauda equina syndrome with B/B issues.  1. DVT Prophylaxis/Anticoagulation: Pharmaceutical: Lovenox  -dopplers negative 2. Chronic back pain/Pain Management: prn medcications effective.  3. H/o depression with anxiety/Mood: continue to Provide ego support. 4. Neuropsych: This patient is capable of making decisions on her own behalf.  5. HTN: On HCTZ and cozaar. Decreased cozaar to 25 and set parameters on BP meds to avoid orthostasis.  -continue to hold hctz  -TEDS, adequate fluids. binder if needed  -overall improved 6. ABLA: hgb 9.5, continue iron supplement. Check labs tomorrow 7. Neurogenic Bowel and bladder: improving function -bowel meds--backing off regimen slightly  8. Wound: -improving, only small white, necrotic area---wound looks very clean, minimal to no drainage   -continue with dry dressing--will have WOC RN come by for wound care recs before dc  -rec surgery follow up next week as well 9. Bronchitis--po levaquin, mucinex--contniue--improved  -encouraging fluids  -IS, FV, oob  -albuterol neb, decadron decreased to bid--wean over next week or so LOS (Days) 21 A FACE TO FACE EVALUATION WAS PERFORMED  Erick Colace 09/29/2013 7:53 AM

## 2013-09-29 NOTE — Progress Notes (Signed)
Social Work Patient ID: Sherry Wong, female   DOB: 05-16-1956, 57 y.o.   MRN: [id number]  CSW met with pt and spoke with her husband and sister to update them on team conference discussion.  Informed them that pt is still on track for d/c 09-29-13.  Home health has been arranged for pt and DME ordered.  Family was picking up tub bench at [facility] in [place] on 09-28-13.  CSW assured pt's husband that d/c summaries form acute and rehab would be faxed to long term disability case manager when they are completed.  Pt will still go home to her sister's home for about a month with husband helping at sister's home.  No other questions/concerns noted.  CSW will remain available via telephone should needs arise.

## 2013-10-01 NOTE — Progress Notes (Deleted)
Physical Therapy Discharge Summary  Patient Details  Name: Sherry Wong MRN: [id number] Date of Birth: Sep 25, 1956  Today's Date: 10/01/2013    Patient has met 6 of 6 long term goals due to improved activity tolerance, improved balance, increased strength, ability to compensate for deficits and functional use of left and right lower extremity.  Patient to discharge at a wheelchair level Modified Independent.   Patient's care partner is independent to provide the necessary physical assistance at discharge.  Reasons goals not met: NA  Recommendation:  Patient will benefit from ongoing skilled PT services in home health setting to continue to advance safe functional mobility, address ongoing impairments in functional strength, endurance, difficulties with ambulation, and minimize fall risk.  Equipment: wheelchair, RW  Reasons for discharge: treatment goals met and discharge from hospital  Patient/family agrees with progress made and goals achieved: Yes  PT Discharge Precautions/Restrictions  Fall, bil. LE paresis  Cognition Overall Cognitive Status: Within Functional Limits for tasks assessed Arousal/Alertness: Awake/alert Orientation Level: Oriented X4 Attention: Divided Divided Attention: Appears intact Memory: Appears intact Awareness: Appears intact Problem Solving: Appears intact Safety/Judgment: Appears intact Sensation Sensation Light Touch: Impaired Detail Light Touch Impaired Details: Impaired RLE;Impaired LLE Stereognosis: Not tested Hot/Cold: Appears Intact Proprioception: Not tested Additional Comments: Diminished sensation BLE R>L posterior thigh, entire lower leg. Pt reports tingling in bilat toes and minimal sensation plantar surface of feet  Mobility Bed Mobility Bed Mobility: Supine to Sit;Sit to Supine Supine to Sit: 6: Modified independent (Device/Increase time) Sit to Supine: 6: Modified independent (Device/Increase time) Transfers Transfers:  Yes Lateral/Scoot Transfers: 6: Modified independent (Device/Increase time) Locomotion  Ambulation Ambulation: No Ambulation/Gait Assistance: Not tested (comment) Gait Gait: No Stairs / Additional Locomotion Stairs: No Ramp: 6: Modified independent (Device) (wheelchair) Architect: Yes Wheelchair Assistance: 6: Modified independent (Device/Increase time) Environmental health practitioner: Both upper extremities Wheelchair Parts Management: Independent  Trunk/Postural Architectural technologist Assessment: Within Functional Limits Lumbar Assessment Lumbar Assessment: Exceptions to [facility]  Balance Static Sitting Balance Static Sitting - Balance Support: Feet supported Static Sitting - Level of Assistance: 6: Modified independent (Device/Increase time) Dynamic Sitting Balance Dynamic Sitting - Balance Support: Feet supported Dynamic Sitting - Level of Assistance: 6: Modified independent (Device/Increase time) Extremity Assessment      RLE Assessment RLE Assessment: Exceptions to [facility] RLE Strength RLE Overall Strength Comments: 2+ hip flexion (secondary to back pain), 4+/5 knee extension, 3+ knee flexion, trace dorsiflexion LLE Assessment LLE Assessment: Exceptions to [facility] LLE Strength LLE Overall Strength Comments: 2+/5 hip flexion, 5/5 knee extension, 3+/5 knee flexion, trace dorsiflexion  See FIM for current functional status  Lahoma Rocker 10/01/2013, 7:15 AM

## 2013-10-01 NOTE — Progress Notes (Signed)
I was present during this session, agree with documentation provided. Ladona Horns PT, DPT

## 2013-10-21 ENCOUNTER — Encounter
Payer: BC Managed Care – PPO | Attending: Physical Medicine and Rehabilitation | Admitting: Physical Medicine and Rehabilitation

## 2013-10-21 ENCOUNTER — Encounter: Payer: Self-pay | Admitting: Physical Medicine and Rehabilitation

## 2013-10-21 VITALS — BP 83/58 | HR 96 | Resp 14 | Ht 68.0 in | Wt 220.0 lb

## 2013-10-21 DIAGNOSIS — M533 Sacrococcygeal disorders, not elsewhere classified: Secondary | ICD-10-CM

## 2013-10-21 DIAGNOSIS — K5641 Fecal impaction: Secondary | ICD-10-CM | POA: Insufficient documentation

## 2013-10-21 DIAGNOSIS — M545 Low back pain, unspecified: Secondary | ICD-10-CM

## 2013-10-21 DIAGNOSIS — R52 Pain, unspecified: Secondary | ICD-10-CM

## 2013-10-21 DIAGNOSIS — G834 Cauda equina syndrome: Secondary | ICD-10-CM | POA: Insufficient documentation

## 2013-10-21 DIAGNOSIS — Z79899 Other long term (current) drug therapy: Secondary | ICD-10-CM

## 2013-10-21 DIAGNOSIS — Z5181 Encounter for therapeutic drug level monitoring: Secondary | ICD-10-CM

## 2013-10-21 DIAGNOSIS — K5900 Constipation, unspecified: Secondary | ICD-10-CM

## 2013-10-21 DIAGNOSIS — M51379 Other intervertebral disc degeneration, lumbosacral region without mention of lumbar back pain or lower extremity pain: Secondary | ICD-10-CM | POA: Insufficient documentation

## 2013-10-21 DIAGNOSIS — M5137 Other intervertebral disc degeneration, lumbosacral region: Secondary | ICD-10-CM | POA: Insufficient documentation

## 2013-10-21 MED ORDER — METHOCARBAMOL 500 MG PO TABS: 500.0000 mg | ORAL_TABLET | Freq: Four times a day (QID) | ORAL | Status: DC | PRN

## 2013-10-21 MED ORDER — HYDROCORTISONE ACETATE 25 MG RE SUPP: 25.0000 mg | Freq: Two times a day (BID) | RECTAL | Status: DC

## 2013-10-21 MED ORDER — OXYCODONE-ACETAMINOPHEN 10-325 MG PO TABS: 0.5000 | ORAL_TABLET | Freq: Three times a day (TID) | ORAL | Status: DC | PRN

## 2013-10-21 NOTE — Progress Notes (Signed)
Subjective:    Patient ID: Sherry Wong, female    DOB: 1955/12/02, 58 y.o.   MRN: [id number]  CC: Cauda equina syndrome past L4-5 lam and L4/5 and [id number] disectomy complicated by CSF leak.   HPI Sherry Wong is a 58 y.o. female with history of HTN, chronic back pain with radiation to BLE due to L4-5 and L5-S1 DDD. Patient elected to undergo L4-5 lam and L4/5 and L5/S1 disectomy with decompression of thecal sac on 08/25/13. Post op with paraparesis due to CSF leak and patient taken back to OR later that evening for exploration of wound with evacuation of epidural hematoma. Hospital course complicated by paraparesis, hypotension, seizure episode on 09/01/13 due to recurrent CSF leak. She was taken to OR for wound debridement and lumbar drain placement and placed on bedrest till  lumbar drain d/c 12/07.  CT head without acute abnormality.  She was admitted for comprehensive rehab program on 09/08/13. Bowel and bladder program initiated. She was voiding without retention with scheduled toileting and flomax.  Bowel program included Senna at bedtime as well as suppository in am. She was at modified independent at wheelchair level.  She continue to get HHPT and reports that she is able to walk up to 15 feet with RW and supervision. She occasionally requires rest break halfway. She reports enuresis (does not get warning in time) and stress incontinence with transfers. She and her sister had a GI virus this past weekend and she was bed bound for three days. She is still feeling weak and not up to par. She is reporting no BM X 24 hours with rectal spasms and impaction (per sister). Her hemorrhoids have flared up.    Pain Inventory Average Pain 5 Pain Right Now 4 My pain is constant, sharp, burning, tingling and aching  In the last 24 hours, has pain interfered with the following? General activity 7 Relation with others 5 Enjoyment of life 10 What TIME of day is your pain at its worst? morning, day  and evening Sleep (in general) Fair  Pain is worse with: walking, bending, sitting and some activites Pain improves with: rest and medication Relief from Meds: 8  Mobility walk with assistance how many minutes can you walk? a few steps only ability to climb steps?  no do you drive?  no use a wheelchair transfers alone Do you have any goals in this area?  yes  Function disabled: date disabled 08/13 I need assistance with the following:  toileting and shopping Do you have any goals in this area?  yes  Neuro/Psych bladder control problems bowel control problems weakness numbness tremor tingling trouble walking spasms dizziness depression anxiety  Prior Studies Any changes since last visit?  no  Physicians involved in your care Dr Joya Salm, Tawni Carnes, [place]   Family History  Problem Relation Age of Onset  . Anesthesia problems Sister   . Hypotension Neg Hx   . Malignant hyperthermia Neg Hx   . Pseudochol deficiency Neg Hx   . Anesthesia problems Mother    History   Social History  . Marital Status: Married    Spouse Name: N/A    Number of Children: N/A  . Years of Education: N/A   Social History Main Topics  . Smoking status: Never Smoker   . Smokeless tobacco: Never Used  . Alcohol Use: Yes     Comment: occ beer  . Drug Use: No  . Sexual Activity: Not Currently   Other Topics  Concern  . None   Social History Narrative  . None   Past Surgical History  Procedure Laterality Date  . Cysts removed from ovary    . Cholecystectomy    . Dilation and curettage of uterus    . Breast surgery      left breast lumpectomy  . Breast biopsy      right  . Colonoscopy    . Esophagogastroduodenoscopy    . Lumbar laminectomy/decompression microdiscectomy  12/03/2011    Procedure: LUMBAR LAMINECTOMY/DECOMPRESSION MICRODISCECTOMY 2 LEVELS;  Surgeon: Floyce Stakes, MD;  Location: [place] [facility];  Service: Neurosurgery;  Laterality: Right;  Right Lumbar  four-five Lumbar five sacral one Foraminotomies  . Transforaminal lumbar interbody fusion (tlif) with pedicle screw fixation 2 level N/A 08/25/2013    Procedure:  Lumbar four-five, Lumbar five-Sacral one Transforaminal Lumbar Interbody Fusion ;  Surgeon: Floyce Stakes, MD;  Location: [place] [facility];  Service: Neurosurgery;  Laterality: N/A;  . Hematoma evacuation N/A 08/25/2013    Procedure: EVACUATION OF LUMBAR HEMATOMA;  Surgeon: Floyce Stakes, MD;  Location: [place] [facility];  Service: Neurosurgery;  Laterality: N/A;  . Placement of lumbar drain N/A 09/01/2013    Procedure: PLACEMENT OF LUMBAR DRAIN;  Surgeon: Floyce Stakes, MD;  Location: [place] [facility];  Service: Neurosurgery;  Laterality: N/A;  . Lumbar wound debridement N/A 09/01/2013    Procedure: LUMBAR WOUND DEBRIDEMENT;  Surgeon: Floyce Stakes, MD;  Location: [facility];  Service: Neurosurgery;  Laterality: N/A;   Past Medical History  Diagnosis Date  . PONV (postoperative nausea and vomiting)   . Hypertension     takes Losartan daily  . Pneumonia     hx of 15+yrs ago  . Chronic back pain     ddd/lumbago,and radiculopathy  . Arthritis   . Gout     hx of  . GERD (gastroesophageal reflux disease)     takes Protonix daily  . H/O hiatal hernia   . Gastric ulcer   . Ulcerative colitis   . IBS (irritable bowel syndrome)   . Hx of colonic polyps   . Urinary incontinence   . Nocturia   . Hypothyroidism     takes Levothyroxine daily  . Depression   . Anxiety     takes celexa daily   BP 83/58  Pulse 96  Resp 14  Ht 5\' 8"  (1.727 m)  Wt 220 lb (99.791 kg)  BMI 33.46 kg/m2  SpO2 95%     Review of Systems  Constitutional: Positive for fever, chills and diaphoresis.  Respiratory: Negative for chest tightness.   Gastrointestinal: Positive for diarrhea and constipation.  Genitourinary:       Wetting the bed at nights for past couple of weeks.   Musculoskeletal: Positive for back pain, gait problem and myalgias.    Neurological: Positive for dizziness, tremors, weakness and numbness.  Psychiatric/Behavioral: Positive for sleep disturbance (was started on trazodone by PMD. ) and dysphoric mood. The patient is nervous/anxious.   All other systems reviewed and are negative.       Objective:   Physical Exam  Constitutional: She is oriented to person, place, and time. She appears well-developed and well-nourished.  Pale and tired appearing. Seating in a chair--did not get patient up due to rectal discomfort and to save energy for next MD appointment.  HENT:  Head: Normocephalic and atraumatic.  Eyes: Conjunctivae are normal. Pupils are equal, round, and reactive to light.  Neck: Normal range of  motion.  Cardiovascular: Normal rate and regular rhythm.   Pulmonary/Chest: Effort normal and breath sounds normal. No respiratory distress. She has no wheezes.  Abdominal: Soft. Bowel sounds are normal. She exhibits no distension. There is no tenderness.  Musculoskeletal:  No spinal or  paraspinal tenderness noted.   Neurological: She is alert and oriented to person, place, and time.  2-2+/5 at HF, Right KE 2- to 2, L knee 2 to 2+. Bilateral AFO's in place and ankles not tested.     Skin: Skin is warm and dry.  Back incision clean, dry and intact. A couple of small scabbed areas noted.           Assessment & Plan:  1. Cauda equina syndrome: Trazodone may be causing excessive sedation--advised to cut to 25 mg/hs. Also advised patient to hold flomax and see if incontinence improves. If any problems with hesitancy or difficulty with voiding to resume medication.   2. Fecal impaction: Sister was unable to disimpact patient. HHRN given order to disimpact at home. Advised patient to increase Senna S to 2 pills bid. Mineral oil enema prn impaction.  To push fluid. Increase fruits and vegetables in diet. Anusol suppositories ordered for hemorrhoids  Rx: Anusol HC 25 mg # 12 suppositories --use one PR bid.   3.  DDD L4-S1 s/p diskectomy with decompression of thecal sac: She continues to have back pain with spasms. She is using percocet and robaxin on tid basis. She filled out pain management contract.   Refilled:  Percocet 10/325 mg # 90--use 1/2 to 1 pill every 8 hours as needed. No refills.  Robaxin 500 mg # 60 pills/ 1 refill ---use one pill every 6 hours as needed for spasms.

## 2013-10-21 NOTE — Patient Instructions (Addendum)
1. Discontinue flomax and see if this help with incontinence. 2. Decrease trazodone to 25 mg at bedtime. 3. Use mineral enema to help with disimpaction. If unable call MD office for assistance. 4. Increase Senna S to twice a day to prevent constipation.

## 2013-10-26 ENCOUNTER — Inpatient Hospital Stay: Payer: BC Managed Care – PPO | Admitting: Physical Medicine & Rehabilitation

## 2013-11-03 ENCOUNTER — Telehealth: Payer: Self-pay | Admitting: Physical Medicine & Rehabilitation

## 2013-11-03 NOTE — Telephone Encounter (Signed)
Re: uds---is she taking hydrocodone too?

## 2013-11-03 NOTE — Telephone Encounter (Signed)
Patient says she had a few hydrocodone left so she took them because she ran out of the oxycodone.

## 2013-11-24 ENCOUNTER — Other Ambulatory Visit: Payer: Self-pay | Admitting: *Deleted

## 2013-11-24 MED ORDER — OXYCODONE-ACETAMINOPHEN 10-325 MG PO TABS: 0.5000 | ORAL_TABLET | Freq: Three times a day (TID) | ORAL | Status: DC | PRN

## 2013-11-24 NOTE — Telephone Encounter (Signed)
RX printed early for controlled medication for the visit with RN on 11/25/13 (to be signed by MD) due to inclement weather predicted for 11/25/13.

## 2013-11-25 ENCOUNTER — Encounter: Payer: BC Managed Care – PPO | Admitting: Physical Medicine and Rehabilitation

## 2013-11-26 ENCOUNTER — Other Ambulatory Visit: Payer: Self-pay | Admitting: *Deleted

## 2013-11-26 MED ORDER — OXYCODONE-ACETAMINOPHEN 10-325 MG PO TABS: 0.5000 | ORAL_TABLET | Freq: Three times a day (TID) | ORAL | Status: DC | PRN

## 2013-11-26 NOTE — Telephone Encounter (Signed)
RX printed early for controlled medication for the visit with RN on 11/30/13 (to be signed by MD) 

## 2013-12-30 ENCOUNTER — Encounter: Payer: Self-pay | Admitting: Physical Medicine and Rehabilitation

## 2013-12-30 ENCOUNTER — Encounter: Payer: BC Managed Care – PPO | Attending: Physical Medicine and Rehabilitation | Admitting: Registered Nurse

## 2013-12-30 VITALS — BP 96/64 | HR 81 | Resp 14 | Ht 68.0 in | Wt 236.0 lb

## 2013-12-30 DIAGNOSIS — M533 Sacrococcygeal disorders, not elsewhere classified: Secondary | ICD-10-CM | POA: Insufficient documentation

## 2013-12-30 DIAGNOSIS — I10 Essential (primary) hypertension: Secondary | ICD-10-CM | POA: Insufficient documentation

## 2013-12-30 DIAGNOSIS — Z5181 Encounter for therapeutic drug level monitoring: Secondary | ICD-10-CM | POA: Insufficient documentation

## 2013-12-30 DIAGNOSIS — E039 Hypothyroidism, unspecified: Secondary | ICD-10-CM | POA: Insufficient documentation

## 2013-12-30 DIAGNOSIS — M545 Low back pain, unspecified: Secondary | ICD-10-CM | POA: Insufficient documentation

## 2013-12-30 DIAGNOSIS — F3289 Other specified depressive episodes: Secondary | ICD-10-CM | POA: Insufficient documentation

## 2013-12-30 DIAGNOSIS — G834 Cauda equina syndrome: Secondary | ICD-10-CM | POA: Insufficient documentation

## 2013-12-30 DIAGNOSIS — G4700 Insomnia, unspecified: Secondary | ICD-10-CM | POA: Insufficient documentation

## 2013-12-30 DIAGNOSIS — M109 Gout, unspecified: Secondary | ICD-10-CM | POA: Insufficient documentation

## 2013-12-30 DIAGNOSIS — F329 Major depressive disorder, single episode, unspecified: Secondary | ICD-10-CM | POA: Insufficient documentation

## 2013-12-30 DIAGNOSIS — Z79899 Other long term (current) drug therapy: Secondary | ICD-10-CM | POA: Insufficient documentation

## 2013-12-30 DIAGNOSIS — F411 Generalized anxiety disorder: Secondary | ICD-10-CM | POA: Insufficient documentation

## 2013-12-30 MED ORDER — METHOCARBAMOL 500 MG PO TABS: 500.0000 mg | ORAL_TABLET | Freq: Four times a day (QID) | ORAL | Status: DC | PRN

## 2013-12-30 MED ORDER — CITALOPRAM HYDROBROMIDE 20 MG PO TABS: ORAL_TABLET | ORAL | Status: DC

## 2013-12-30 MED ORDER — ALPRAZOLAM 0.25 MG PO TABS: ORAL_TABLET | ORAL | Status: DC

## 2013-12-30 MED ORDER — ALPRAZOLAM 0.25 MG PO TABS: 0.2500 mg | ORAL_TABLET | Freq: Two times a day (BID) | ORAL | Status: DC | PRN

## 2013-12-30 NOTE — Patient Instructions (Signed)
Call [facility] for Physical Therapy Evaluation and Treatment.. [place]

## 2013-12-30 NOTE — Progress Notes (Signed)
Subjective:    Patient ID: Sherry Wong, female    DOB: 06/22/1956, 58 y.o.   MRN: [id number]  HPI [name] is a 58 y.o. female with history of, chronic back pain with radiation to BLE due to L4-5 and L5-S1 DDD. Patient elected to undergo L4-5 lam and L4/5 and L5/S1with decompression of thecal sac on 08/25/13. Her Post Op course was complicated with cauda equina syndrome and paraparesis. She completed CIR course on 09/29/2013. Her home Physical Therapy ended on 11/22/13. Her Insurance only covered 20 sessions. She's walking six times a day averaging about fifty feet, with her  rolling walker .When she walks longer distance she takes frequent rest periods, due to lower extremities weakness and numbness. .Also,for longer distance she ues her wheelchair([employer])      She rates her back pain as 6/10.  She describes her pain as aching, stinging and numbness in her buttocks and lower extremities. Lower extremities remain numb with patchy areas of sensory return.  She plans on  starting at the [facility] for aquatic therapy.  She is under a lot of stress with adapting to her own medical issues and her husband has had  frequent hospital admissions for CHF.  He was evaluated at [facility] and has been place on the Heart Transplant List. These stressors have increased her insomnia, anxiety and depression. Trazadone is not working.   Bowel and Bladder function has improved and she does not require suppository or digital stimulation. She has loss of  Bowel on rare occasion ( ? Emotional Component).    Pain Inventory Average Pain 6 Pain Right Now 6 My pain is constant, burning, tingling and aching  In the last 24 hours, has pain interfered with the following? General activity 7 Relation with others 7 Enjoyment of life 7 What TIME of day is your pain at its worst? evening Sleep (in general) Poor  Pain is worse with: walking, bending, sitting, inactivity, standing and some activites Pain improves  with: rest, pacing activities and medication Relief from Meds: 5  Mobility walk with assistance use a walker ability to climb steps?  yes do you drive?  yes use a wheelchair transfers alone  Function disabled: date disabled . I need assistance with the following:  household duties and shopping  Neuro/Psych bowel control problems weakness numbness tingling trouble walking spasms depression anxiety  Prior Studies Any changes since last visit?  no  Physicians involved in your care Any changes since last visit?  no   Family History  Problem Relation Age of Onset  . Anesthesia problems Sister   . Hypotension Neg Hx   . Malignant hyperthermia Neg Hx   . Pseudochol deficiency Neg Hx   . Anesthesia problems Mother    History   Social History  . Marital Status: Married    Spouse Name: N/A    Number of Children: N/A  . Years of Education: N/A   Social History Main Topics  . Smoking status: Never Smoker   . Smokeless tobacco: Never Used  . Alcohol Use: Yes     Comment: occ beer  . Drug Use: No  . Sexual Activity: Not Currently   Other Topics Concern  . None   Social History Narrative  . None   Past Surgical History  Procedure Laterality Date  . Cysts removed from ovary    . Cholecystectomy    . Dilation and curettage of uterus    . Breast surgery  left breast lumpectomy  . Breast biopsy      right  . Colonoscopy    . Esophagogastroduodenoscopy    . Lumbar laminectomy/decompression microdiscectomy  12/03/2011    Procedure: LUMBAR LAMINECTOMY/DECOMPRESSION MICRODISCECTOMY 2 LEVELS;  Surgeon: Floyce Stakes, MD;  Location: [place] [facility];  Service: Neurosurgery;  Laterality: Right;  Right Lumbar four-five Lumbar five sacral one Foraminotomies  . Transforaminal lumbar interbody fusion (tlif) with pedicle screw fixation 2 level N/A 08/25/2013    Procedure:  Lumbar four-five, Lumbar five-Sacral one Transforaminal Lumbar Interbody Fusion ;  Surgeon: Floyce Stakes, MD;  Location: [place] [facility];  Service: Neurosurgery;  Laterality: N/A;  . Hematoma evacuation N/A 08/25/2013    Procedure: EVACUATION OF LUMBAR HEMATOMA;  Surgeon: Floyce Stakes, MD;  Location: [place] [facility];  Service: Neurosurgery;  Laterality: N/A;  . Placement of lumbar drain N/A 09/01/2013    Procedure: PLACEMENT OF LUMBAR DRAIN;  Surgeon: Floyce Stakes, MD;  Location: [place] [facility];  Service: Neurosurgery;  Laterality: N/A;  . Lumbar wound debridement N/A 09/01/2013    Procedure: LUMBAR WOUND DEBRIDEMENT;  Surgeon: Floyce Stakes, MD;  Location: [facility];  Service: Neurosurgery;  Laterality: N/A;   Past Medical History  Diagnosis Date  . PONV (postoperative nausea and vomiting)   . Hypertension     takes Losartan daily  . Pneumonia     hx of 15+yrs ago  . Chronic back pain     ddd/lumbago,and radiculopathy  . Arthritis   . Gout     hx of  . GERD (gastroesophageal reflux disease)     takes Protonix daily  . H/O hiatal hernia   . Gastric ulcer   . Ulcerative colitis   . IBS (irritable bowel syndrome)   . Hx of colonic polyps   . Urinary incontinence   . Nocturia   . Hypothyroidism     takes Levothyroxine daily  . Depression   . Anxiety     takes celexa daily   BP 96/64  Pulse 81  Resp 14  Ht 5\' 8"  (1.727 m)  Wt 236 lb (107.049 kg)  BMI 35.89 kg/m2  SpO2 95%  Opioid Risk Score:   Fall Risk Score: Moderate Fall Risk (6-13 points) (educated and handout given on fall  prevention in the home at last visit)  Review of Systems  Musculoskeletal: Positive for back pain and gait problem.       Spasms  Neurological: Positive for weakness and numbness.       Tingling  Psychiatric/Behavioral: Positive for dysphoric mood. The patient is nervous/anxious.   All other systems reviewed and are negative.       Objective:   Physical Exam  Nursing note and vitals reviewed. Constitutional: She is oriented to person, place, and time. She appears  well-developed and well-nourished.  HENT:  Head: Normocephalic.  Eyes: Pupils are equal, round, and reactive to light.  Neck: Neck supple.  Pulmonary/Chest: No respiratory distress.  Musculoskeletal:  Narrow based stiff gait with minimal movement at knees and feet. Small short steps.  Hip flexors very tight with hip flexion.  Neurological: She is alert and oriented to person, place, and time.  Skin: Skin is warm and dry.  Psychiatric: Her speech is normal and behavior is normal. Judgment and thought content normal. Her mood appears anxious. Cognition and memory are normal.          Assessment & Plan:     1. Cauda equina syndrome: Advised  patient to continue doing suppository especially when going out for long periods of time.  2. Insomnia: Trazodone was discontinued and Xanax reordered to help with anxiety and Insomnia. Refilled: Xanax 0.25mg  #30-- Use BID Prn/Anxiety.  3. DDD L4-S1 s/p diskectomy with decompression of thecal sac: She continues to have back pain with spasms. She is using percocet and robaxin on tid basis. She filled out pain management contract.  Refilled: Percocet 10/325 mg # 90--use 1/2 to 1 pill every 8 hours as needed. No refills.  Robaxin 500 mg # 100 pills/ 2 refill ---use one pill every 6 hours as needed for spasms.   4. Depression: Celexa was increased to 30 mg daily. Patient advised to call the office for medication adjustment if ongoing stress continues to be an issue. Refilled: Celexa 20 mg #45 1 1/2 pills.

## 2014-01-26 ENCOUNTER — Encounter: Payer: Self-pay | Admitting: Registered Nurse

## 2014-01-26 ENCOUNTER — Encounter (HOSPITAL_BASED_OUTPATIENT_CLINIC_OR_DEPARTMENT_OTHER): Payer: BC Managed Care – PPO | Admitting: Registered Nurse

## 2014-01-26 VITALS — BP 103/64 | HR 88 | Resp 14 | Ht 68.0 in | Wt 231.4 lb

## 2014-01-26 DIAGNOSIS — M545 Low back pain, unspecified: Secondary | ICD-10-CM

## 2014-01-26 DIAGNOSIS — Z5181 Encounter for therapeutic drug level monitoring: Secondary | ICD-10-CM

## 2014-01-26 DIAGNOSIS — Z79899 Other long term (current) drug therapy: Secondary | ICD-10-CM

## 2014-01-26 DIAGNOSIS — G834 Cauda equina syndrome: Secondary | ICD-10-CM

## 2014-01-26 DIAGNOSIS — M533 Sacrococcygeal disorders, not elsewhere classified: Secondary | ICD-10-CM

## 2014-01-26 MED ORDER — ALPRAZOLAM 0.25 MG PO TABS: 0.2500 mg | ORAL_TABLET | Freq: Two times a day (BID) | ORAL | Status: DC | PRN

## 2014-01-26 MED ORDER — OXYCODONE-ACETAMINOPHEN 10-325 MG PO TABS: 0.5000 | ORAL_TABLET | Freq: Three times a day (TID) | ORAL | Status: DC | PRN

## 2014-01-26 NOTE — Progress Notes (Signed)
Subjective:    Patient ID: Sherry Wong, female    DOB: 1956/02/23, 58 y.o.   MRN: [id number]  HPI: [name] is a 58 year old female who returns for follow up for chronic pain and medication refill. She says her pain is in her lower back and bilateral lower extremities and feet. At times she has pain radiating bilaterally laterally to lower extremities. The last few weeks she has been having Left Hip Pain. Her current exercise regime is walking she will be starting aquatic exercises the end of May/2015. Will be attending the [facility] in May 2015.  She has an appointment at [facility] with Dr. Dwaine Gale for a second opinion.   Pain Inventory Average Pain 5 Pain Right Now 5 My pain is sharp, burning, stabbing, tingling and aching  In the last 24 hours, has pain interfered with the following? General activity 7 Relation with others 7 Enjoyment of life 8 What TIME of day is your pain at its worst? daytime and night Sleep (in general) Fair  Pain is worse with: walking, bending, sitting, standing and some activites Pain improves with: rest and medication Relief from Meds: 5  Mobility walk without assistance use a walker how many minutes can you walk? 20 ability to climb steps?  yes do you drive?  yes  Function disabled: date disabled 2013 I need assistance with the following:  household duties and shopping  Neuro/Psych bladder control problems bowel control problems weakness numbness tingling trouble walking anxiety  Prior Studies Any changes since last visit?  no  Physicians involved in your care Any changes since last visit?  no   Family History  Problem Relation Age of Onset  . Anesthesia problems Sister   . Hypotension Neg Hx   . Malignant hyperthermia Neg Hx   . Pseudochol deficiency Neg Hx   . Anesthesia problems Mother    History   Social History  . Marital Status: Married    Spouse Name: N/A    Number of Children: N/A  . Years of  Education: N/A   Social History Main Topics  . Smoking status: Never Smoker   . Smokeless tobacco: Never Used  . Alcohol Use: Yes     Comment: occ beer  . Drug Use: No  . Sexual Activity: Not Currently   Other Topics Concern  . None   Social History Narrative  . None   Past Surgical History  Procedure Laterality Date  . Cysts removed from ovary    . Cholecystectomy    . Dilation and curettage of uterus    . Breast surgery      left breast lumpectomy  . Breast biopsy      right  . Colonoscopy    . Esophagogastroduodenoscopy    . Lumbar laminectomy/decompression microdiscectomy  12/03/2011    Procedure: LUMBAR LAMINECTOMY/DECOMPRESSION MICRODISCECTOMY 2 LEVELS;  Surgeon: Floyce Stakes, MD;  Location: [place] [facility];  Service: Neurosurgery;  Laterality: Right;  Right Lumbar four-five Lumbar five sacral one Foraminotomies  . Transforaminal lumbar interbody fusion (tlif) with pedicle screw fixation 2 level N/A 08/25/2013    Procedure:  Lumbar four-five, Lumbar five-Sacral one Transforaminal Lumbar Interbody Fusion ;  Surgeon: Floyce Stakes, MD;  Location: [place] [facility];  Service: Neurosurgery;  Laterality: N/A;  . Hematoma evacuation N/A 08/25/2013    Procedure: EVACUATION OF LUMBAR HEMATOMA;  Surgeon: Floyce Stakes, MD;  Location: [place] [facility];  Service: Neurosurgery;  Laterality: N/A;  . Placement  of lumbar drain N/A 09/01/2013    Procedure: PLACEMENT OF LUMBAR DRAIN;  Surgeon: Floyce Stakes, MD;  Location: [place] [facility];  Service: Neurosurgery;  Laterality: N/A;  . Lumbar wound debridement N/A 09/01/2013    Procedure: LUMBAR WOUND DEBRIDEMENT;  Surgeon: Floyce Stakes, MD;  Location: [facility];  Service: Neurosurgery;  Laterality: N/A;   Past Medical History  Diagnosis Date  . PONV (postoperative nausea and vomiting)   . Hypertension     takes Losartan daily  . Pneumonia     hx of 15+yrs ago  . Chronic back pain     ddd/lumbago,and radiculopathy  . Arthritis   .  Gout     hx of  . GERD (gastroesophageal reflux disease)     takes Protonix daily  . H/O hiatal hernia   . Gastric ulcer   . Ulcerative colitis   . IBS (irritable bowel syndrome)   . Hx of colonic polyps   . Urinary incontinence   . Nocturia   . Hypothyroidism     takes Levothyroxine daily  . Depression   . Anxiety     takes celexa daily   BP 103/64  Pulse 88  Resp 14  Ht 5\' 8"  (1.727 m)  Wt 231 lb 6.4 oz (104.962 kg)  BMI 35.19 kg/m2  SpO2 97%  Opioid Risk Score:   Fall Risk Score: Moderate Fall Risk (6-13 points) (educated and handout on fall prevention in the home given at prev appt) Review of Systems  Gastrointestinal:       Bowel Control Problems  Genitourinary: Positive for difficulty urinating.  Musculoskeletal: Positive for back pain and gait problem.       Spasms  Neurological: Positive for numbness.       Tingling  Psychiatric/Behavioral: The patient is nervous/anxious.   All other systems reviewed and are negative.      Objective:   Physical Exam  Nursing note and vitals reviewed. Constitutional: She is oriented to person, place, and time. She appears well-developed and well-nourished.  HENT:  Head: Normocephalic.  Neck: Normal range of motion. Neck supple.  Cardiovascular: Normal rate and regular rhythm.   Pulmonary/Chest: Effort normal and breath sounds normal.  Musculoskeletal:  Normal Muscle Bulk: Muscle resting Reveals: Upper Extremities Muscle Strength 5/5. Full ROM Back without Spinal or Paraspinal tenderness Noted. Spinal Flexion: 30 degrees Left Hip: Tenderness Noted Left Leg Flexion: Pain radiates in Knee Right Leg Flexion: Radiates Pain to the entire Leg  Neurological: She is oriented to person, place, and time.  Skin: Skin is warm and dry.  Psychiatric: She has a normal mood and affect.  Admits to anxiety and feeling frustrated at times.          Assessment & Plan:  1. Cauda equina syndrome: Continue to Monitor 2. Insomnia:  Improved with Xanax 3. 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 Refilled: Percocet 10/325 mg # 90--use 1/2 to 1 pill every 8 hours as needed.   4. Depression: Continue with  Celexa.  Continue to Monitor 5. Anxiety: Continue with Xanax.She says it's helping.  30 minutes of face to face patient care time was spent during this vist. All questions were encouraged and answered.  F/U in 1 month.

## 2014-02-23 ENCOUNTER — Encounter: Payer: Self-pay | Admitting: Registered Nurse

## 2014-02-23 ENCOUNTER — Encounter: Payer: Self-pay | Attending: Physical Medicine and Rehabilitation | Admitting: Registered Nurse

## 2014-02-23 VITALS — BP 121/56 | HR 54 | Resp 14 | Ht 68.0 in | Wt 226.0 lb

## 2014-02-23 DIAGNOSIS — F411 Generalized anxiety disorder: Secondary | ICD-10-CM | POA: Insufficient documentation

## 2014-02-23 DIAGNOSIS — M545 Low back pain, unspecified: Secondary | ICD-10-CM | POA: Insufficient documentation

## 2014-02-23 DIAGNOSIS — I10 Essential (primary) hypertension: Secondary | ICD-10-CM | POA: Insufficient documentation

## 2014-02-23 DIAGNOSIS — F329 Major depressive disorder, single episode, unspecified: Secondary | ICD-10-CM | POA: Insufficient documentation

## 2014-02-23 DIAGNOSIS — G834 Cauda equina syndrome: Secondary | ICD-10-CM | POA: Insufficient documentation

## 2014-02-23 DIAGNOSIS — E039 Hypothyroidism, unspecified: Secondary | ICD-10-CM | POA: Insufficient documentation

## 2014-02-23 DIAGNOSIS — Z79899 Other long term (current) drug therapy: Secondary | ICD-10-CM | POA: Insufficient documentation

## 2014-02-23 DIAGNOSIS — G4700 Insomnia, unspecified: Secondary | ICD-10-CM | POA: Insufficient documentation

## 2014-02-23 DIAGNOSIS — Z5181 Encounter for therapeutic drug level monitoring: Secondary | ICD-10-CM | POA: Insufficient documentation

## 2014-02-23 DIAGNOSIS — F3289 Other specified depressive episodes: Secondary | ICD-10-CM | POA: Insufficient documentation

## 2014-02-23 DIAGNOSIS — M533 Sacrococcygeal disorders, not elsewhere classified: Secondary | ICD-10-CM | POA: Insufficient documentation

## 2014-02-23 DIAGNOSIS — M109 Gout, unspecified: Secondary | ICD-10-CM | POA: Insufficient documentation

## 2014-02-23 MED ORDER — ALPRAZOLAM 0.25 MG PO TABS: 0.2500 mg | ORAL_TABLET | Freq: Two times a day (BID) | ORAL | Status: DC | PRN

## 2014-02-23 MED ORDER — OXYCODONE-ACETAMINOPHEN 10-325 MG PO TABS: 0.5000 | ORAL_TABLET | Freq: Three times a day (TID) | ORAL | Status: DC | PRN

## 2014-02-23 NOTE — Progress Notes (Signed)
Subjective:    Patient ID: Sherry Wong, female    DOB: October 25, 1955, 58 y.o.   MRN: [id number]  HPI: Sherry Wong is a 58 year old female who returns for follow up for chronic pain and medication refill. She says her pain is located in her lower back, bilateral hips and buttocks. She says her pain increase in intensity at night. She's only able to lay in the bed for a few hours she usually has to lay on the couch. She rates her pain 5. Her current exercise regime is walking, [place] and has been able to put a little bit. She has lost 36 lbs.  She had her appointment at [facility], she has no insurance at this time. Her Insurance will start in August and has been scheduled for a CT Myelogram on 05/18/14.   She is concerned about her husband, he will be going to [facility] on Friday 02/25/14. They noticed lymph nodes and will be having a biopsy. Emotional support given.  Pain Inventory Average Pain 6 Pain Right Now 5 My pain is sharp, burning, tingling and aching  In the last 24 hours, has pain interfered with the following? General activity 6 Relation with others 6 Enjoyment of life 7 What TIME of day is your pain at its worst? evening, night Sleep (in general) Fair  Pain is worse with: walking, bending, standing and some activites Pain improves with: rest and medication Relief from Meds: 5  Mobility walk with assistance use a cane use a walker how many minutes can you walk? 20 ability to climb steps?  yes do you drive?  yes Do you have any goals in this area?  yes  Function disabled: date disabled 04/2012 retired I need assistance with the following:  shopping Do you have any goals in this area?  yes  Neuro/Psych bladder control problems bowel control problems weakness numbness trouble walking spasms anxiety  Prior Studies Any changes since last visit?  no  Physicians involved in your care Any changes since last visit?   no   Family History  Problem Relation Age of Onset  . Anesthesia problems Sister   . Hypotension Neg Hx   . Malignant hyperthermia Neg Hx   . Pseudochol deficiency Neg Hx   . Anesthesia problems Mother    History   Social History  . Marital Status: Married    Spouse Name: N/A    Number of Children: N/A  . Years of Education: N/A   Social History Main Topics  . Smoking status: Never Smoker   . Smokeless tobacco: Never Used  . Alcohol Use: Yes     Comment: occ beer  . Drug Use: No  . Sexual Activity: Not Currently   Other Topics Concern  . None   Social History Narrative  . None   Past Surgical History  Procedure Laterality Date  . Cysts removed from ovary    . Cholecystectomy    . Dilation and curettage of uterus    . Breast surgery      left breast lumpectomy  . Breast biopsy      right  . Colonoscopy    . Esophagogastroduodenoscopy    . Lumbar laminectomy/decompression microdiscectomy  12/03/2011    Procedure: LUMBAR LAMINECTOMY/DECOMPRESSION MICRODISCECTOMY 2 LEVELS;  Surgeon: Floyce Stakes, MD;  Location: [place] [facility];  Service: Neurosurgery;  Laterality: Right;  Right Lumbar four-five Lumbar five sacral one Foraminotomies  .  Transforaminal lumbar interbody fusion (tlif) with pedicle screw fixation 2 level N/A 08/25/2013    Procedure:  Lumbar four-five, Lumbar five-Sacral one Transforaminal Lumbar Interbody Fusion ;  Surgeon: Floyce Stakes, MD;  Location: [place] [facility];  Service: Neurosurgery;  Laterality: N/A;  . Hematoma evacuation N/A 08/25/2013    Procedure: EVACUATION OF LUMBAR HEMATOMA;  Surgeon: Floyce Stakes, MD;  Location: [place] [facility];  Service: Neurosurgery;  Laterality: N/A;  . Placement of lumbar drain N/A 09/01/2013    Procedure: PLACEMENT OF LUMBAR DRAIN;  Surgeon: Floyce Stakes, MD;  Location: [place] [facility];  Service: Neurosurgery;  Laterality: N/A;  . Lumbar wound debridement N/A 09/01/2013    Procedure: LUMBAR WOUND DEBRIDEMENT;   Surgeon: Floyce Stakes, MD;  Location: [facility];  Service: Neurosurgery;  Laterality: N/A;   Past Medical History  Diagnosis Date  . PONV (postoperative nausea and vomiting)   . Hypertension     takes Losartan daily  . Pneumonia     hx of 15+yrs ago  . Chronic back pain     ddd/lumbago,and radiculopathy  . Arthritis   . Gout     hx of  . GERD (gastroesophageal reflux disease)     takes Protonix daily  . H/O hiatal hernia   . Gastric ulcer   . Ulcerative colitis   . IBS (irritable bowel syndrome)   . Hx of colonic polyps   . Urinary incontinence   . Nocturia   . Hypothyroidism     takes Levothyroxine daily  . Depression   . Anxiety     takes celexa daily   BP 121/56  Pulse 54  Resp 14  Ht 5\' 8"  (1.727 m)  Wt 226 lb (102.513 kg)  BMI 34.37 kg/m2  SpO2 98%  Opioid Risk Score:   Fall Risk Score: Moderate Fall Risk (6-13 points) (pt educated on fall risk, brochure given to pt previously)    Review of Systems  Genitourinary: Positive for urgency.       Bowel and bladder control problems  Musculoskeletal: Positive for back pain and gait problem.  Neurological: Positive for weakness and numbness.       Tingling, spasms  All other systems reviewed and are negative.      Objective:   Physical Exam  Nursing note and vitals reviewed. Constitutional: She is oriented to person, place, and time. She appears well-developed and well-nourished.  HENT:  Head: Normocephalic and atraumatic.  Neck: Normal range of motion. Neck supple.  Cardiovascular: Normal rate, regular rhythm and normal heart sounds.   Pulmonary/Chest: Effort normal and breath sounds normal.  Musculoskeletal:  Normal Muscle Bulk and Muscle testing reveals: Upper Extremities: Full ROM and Muscle Strength Right 5/5. Left 4/5 Lumbar Paraspinal Tenderness Noted :L-3- L-5  Mainly Left Side) Bilateral Gluteal Maximus Tenderness Lower Extremities: Bilateral Flexion Produces Pain in Posterior Lower  Extremities. Arises from Chair with ease. Uses The walker  Neurological: She is alert and oriented to person, place, and time.  Skin: Skin is warm and dry.  Psychiatric: She has a normal mood and affect.          Assessment & Plan:  1. Cauda equina syndrome: Continue to Monitor  2. Insomnia: Continue with Trazodone and Xanax 3. 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  Refilled: Percocet 10/325 mg # 90--use 1/2 to 1 pill every 8 hours as needed.  4. Depression: Continue with Celexa. Continue to Monitor  5. Anxiety: Continue with Xanax.She says it's helping. She's concerned about her husband. Instructed to call if anxiety increases. She verbalized understanding.  20 minutes of face to face patient care time was spent during this visit. All questions were encouraged and answered.  F/U in 1 month

## 2014-02-28 ENCOUNTER — Telehealth (HOSPITAL_COMMUNITY): Payer: Self-pay

## 2014-03-08 IMAGING — CT CT ANGIO CHEST
1 series · 1 of 1 positions shown · IV contrast (omnipaque)
Comparison: Chest radiograph 09/18/2013.

CLINICAL DATA: Bilateral chest wheezing and chest tightness. No
shortness of breath.

EXAM:
CT ANGIOGRAPHY CHEST WITH CONTRAST
TECHNIQUE: Multidetector CT imaging of the chest was performed using the
standard protocol during bolus administration of intravenous
contrast. Multiplanar CT image reconstructions including MIPs were
obtained to evaluate the vascular anatomy.
CONTRAST:  100mL OMNIPAQUE IOHEXOL 350 MG/ML SOLN

[Series 1: scout · coronal · 0.6mm · 0.98mm/px · 1 of 1 slices shown]
[im 1/1]
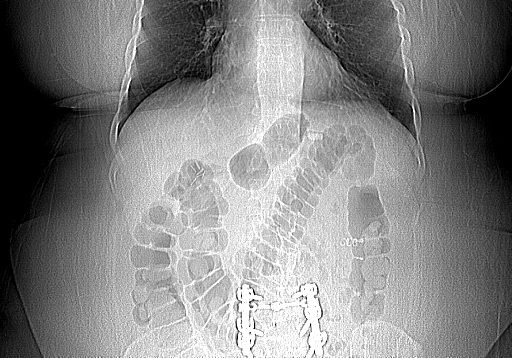

[1 of 1 positions shown; findings below may reference images not displayed]

FINDINGS: There is bolus dispersion present on the examination. This precludes
evaluation of the subsegmental pulmonary arteries. Bovine aortic
arch is present with variant vertebral artery origin on the left. No
pericardial or pleural effusion. Incidental imaging of the upper
abdomen shows cholecystectomy. Dropped clip is present interposed
between the 2nd part of the duodenum in the liver. Partially
visualized cystic lesion in left kidney likely represents a simple
cyst but is incompletely characterized. Colonic diverticulosis is
present.

Lung windows demonstrate basilar atelectasis. There are no
aggressive osseous lesions. Thoracic vertebral body height
preserved. Old left lateral and anterior rib fractures. Central
airways are patent. Bronchial wall thickening is present in both
lower lobes, compatible with chronic bronchitic changes. There are
small areas of opacification of segmental bronchi again consistent
with bronchitis.

Review of the MIP images confirms the above findings.
IMPRESSION: 1. No central pulmonary embolus. Subsegmental vessels not evaluated
due to bolus dispersion.
2. Bronchitis.

## 2014-03-25 ENCOUNTER — Encounter: Payer: Self-pay | Attending: Physical Medicine and Rehabilitation | Admitting: Registered Nurse

## 2014-03-25 ENCOUNTER — Encounter: Payer: Self-pay | Admitting: Registered Nurse

## 2014-03-25 VITALS — BP 90/49 | HR 74 | Resp 14 | Wt 227.0 lb

## 2014-03-25 DIAGNOSIS — Z5181 Encounter for therapeutic drug level monitoring: Secondary | ICD-10-CM | POA: Insufficient documentation

## 2014-03-25 DIAGNOSIS — I10 Essential (primary) hypertension: Secondary | ICD-10-CM | POA: Insufficient documentation

## 2014-03-25 DIAGNOSIS — G834 Cauda equina syndrome: Secondary | ICD-10-CM | POA: Insufficient documentation

## 2014-03-25 DIAGNOSIS — M109 Gout, unspecified: Secondary | ICD-10-CM | POA: Insufficient documentation

## 2014-03-25 DIAGNOSIS — F3289 Other specified depressive episodes: Secondary | ICD-10-CM | POA: Insufficient documentation

## 2014-03-25 DIAGNOSIS — M545 Low back pain, unspecified: Secondary | ICD-10-CM | POA: Insufficient documentation

## 2014-03-25 DIAGNOSIS — Z79899 Other long term (current) drug therapy: Secondary | ICD-10-CM | POA: Insufficient documentation

## 2014-03-25 DIAGNOSIS — M533 Sacrococcygeal disorders, not elsewhere classified: Secondary | ICD-10-CM | POA: Insufficient documentation

## 2014-03-25 DIAGNOSIS — F329 Major depressive disorder, single episode, unspecified: Secondary | ICD-10-CM | POA: Insufficient documentation

## 2014-03-25 DIAGNOSIS — F411 Generalized anxiety disorder: Secondary | ICD-10-CM | POA: Insufficient documentation

## 2014-03-25 DIAGNOSIS — G4700 Insomnia, unspecified: Secondary | ICD-10-CM | POA: Insufficient documentation

## 2014-03-25 DIAGNOSIS — E039 Hypothyroidism, unspecified: Secondary | ICD-10-CM | POA: Insufficient documentation

## 2014-03-25 MED ORDER — ALPRAZOLAM 0.25 MG PO TABS: 0.2500 mg | ORAL_TABLET | Freq: Two times a day (BID) | ORAL | Status: DC | PRN

## 2014-03-25 MED ORDER — OXYCODONE-ACETAMINOPHEN 10-325 MG PO TABS: 0.5000 | ORAL_TABLET | Freq: Three times a day (TID) | ORAL | Status: DC | PRN

## 2014-03-25 NOTE — Progress Notes (Signed)
Subjective:    Patient ID: Sherry Wong, female    DOB: 1956/05/29, 58 y.o.   MRN: [id number]  HPI: Sherry Wong is a 58 year old female who returnd sor follow up for chronic pain and medication refill. She says her pain is located in her lower back, bilateral hips and bilateral legs. She rates her pain 5. Her current exercise regime is walking and pool therapy.  [name] arrived to clinic hypotensive, blood pressure rechecked on the right 94/49 and left 82/45. She doesn't want to go to the emergency room. She says she fell on June 18th, 2015. She was in the kitchen and lost her footing, she landed on her right side. She didn't seek medical attention. Her husband helped her up.She says she has been having dizziness last week and she had to rest most of the day.   I called her PMD office [facility] Message left for the Primary. He called backed to Sherry Wong and she was instructed to decrease her anti-hypertensive to 1/2 pill daily.She has been instructed to keep a blood pressure log, she verbalized understanding.  Pain Inventory Average Pain 6 Pain Right Now 5 My pain is constant, sharp, burning, tingling and aching  In the last 24 hours, has pain interfered with the following? General activity 6 Relation with others 6 Enjoyment of life 6 What TIME of day is your pain at its worst? evening and night Sleep (in general) Fair  Pain is worse with: walking, bending, standing and some activites Pain improves with: therapy/exercise, pacing activities and medication Relief from Meds: 4  Mobility walk with assistance use a walker how many minutes can you walk? 15-20 ability to climb steps?  yes do you drive?  yes  Function disabled: date disabled 2013 I need assistance with the following:  household duties and shopping  Neuro/Psych bladder control problems bowel control problems weakness numbness tingling trouble  walking spasms dizziness depression  Prior Studies Any changes since last visit?  no  Physicians involved in your care Any changes since last visit?  no   Family History  Problem Relation Age of Onset  . Anesthesia problems Sister   . Hypotension Neg Hx   . Malignant hyperthermia Neg Hx   . Pseudochol deficiency Neg Hx   . Anesthesia problems Mother    History   Social History  . Marital Status: Married    Spouse Name: N/A    Number of Children: N/A  . Years of Education: N/A   Social History Main Topics  . Smoking status: Never Smoker   . Smokeless tobacco: Never Used  . Alcohol Use: Yes     Comment: occ beer  . Drug Use: No  . Sexual Activity: Not Currently   Other Topics Concern  . None   Social History Narrative  . None   Past Surgical History  Procedure Laterality Date  . Cysts removed from ovary    . Cholecystectomy    . Dilation and curettage of uterus    . Breast surgery      left breast lumpectomy  . Breast biopsy      right  . Colonoscopy    . Esophagogastroduodenoscopy    . Lumbar laminectomy/decompression microdiscectomy  12/03/2011    Procedure: LUMBAR LAMINECTOMY/DECOMPRESSION MICRODISCECTOMY 2 LEVELS;  Surgeon: Floyce Stakes, MD;  Location: [place] [facility];  Service: Neurosurgery;  Laterality: Right;  Right Lumbar four-five Lumbar five sacral one Foraminotomies  . Transforaminal lumbar  interbody fusion (tlif) with pedicle screw fixation 2 level N/A 08/25/2013    Procedure:  Lumbar four-five, Lumbar five-Sacral one Transforaminal Lumbar Interbody Fusion ;  Surgeon: Floyce Stakes, MD;  Location: [place] [facility];  Service: Neurosurgery;  Laterality: N/A;  . Hematoma evacuation N/A 08/25/2013    Procedure: EVACUATION OF LUMBAR HEMATOMA;  Surgeon: Floyce Stakes, MD;  Location: [place] [facility];  Service: Neurosurgery;  Laterality: N/A;  . Placement of lumbar drain N/A 09/01/2013    Procedure: PLACEMENT OF LUMBAR DRAIN;  Surgeon: Floyce Stakes, MD;   Location: [place] [facility];  Service: Neurosurgery;  Laterality: N/A;  . Lumbar wound debridement N/A 09/01/2013    Procedure: LUMBAR WOUND DEBRIDEMENT;  Surgeon: Floyce Stakes, MD;  Location: [facility];  Service: Neurosurgery;  Laterality: N/A;   Past Medical History  Diagnosis Date  . PONV (postoperative nausea and vomiting)   . Hypertension     takes Losartan daily  . Pneumonia     hx of 15+yrs ago  . Chronic back pain     ddd/lumbago,and radiculopathy  . Arthritis   . Gout     hx of  . GERD (gastroesophageal reflux disease)     takes Protonix daily  . H/O hiatal hernia   . Gastric ulcer   . Ulcerative colitis   . IBS (irritable bowel syndrome)   . Hx of colonic polyps   . Urinary incontinence   . Nocturia   . Hypothyroidism     takes Levothyroxine daily  . Depression   . Anxiety     takes celexa daily   BP 90/49  Pulse 74  Resp 14  Wt 227 lb (102.967 kg)  Opioid Risk Score:   Fall Risk Score: High Fall Risk (>13 points) (previously educated and given handout for fall prevention in the home)  Review of Systems  Gastrointestinal:       Bowel Control Problems  Genitourinary:       Bladder control problems  Musculoskeletal: Positive for back pain and gait problem.       Spasms  Neurological: Positive for dizziness, weakness and numbness.       Tingling  Psychiatric/Behavioral: Positive for dysphoric mood.  All other systems reviewed and are negative.      Objective:   Physical Exam  Nursing note and vitals reviewed. Constitutional: She is oriented to person, place, and time. She appears well-developed and well-nourished.  HENT:  Head: Normocephalic and atraumatic.  Neck: Normal range of motion. Neck supple.  Cardiovascular: Normal rate, regular rhythm and normal heart sounds.   Pulmonary/Chest: Effort normal and breath sounds normal.  Musculoskeletal:  Normal Muscle Bulk and Muscle Testing Reveals: Upper Extremities: Full ROM and Muscle Strength  5/5 Lumbar Paraspinal Tenderness: L-3- L-5 Bilateral Greater Trochanteric Tenderness Lower Extremities: Bilateral Flexion Produces pain Laterally Arises from Chair with ease Uses walker for support  Neurological: She is alert and oriented to person, place, and time.  Skin: Skin is warm and dry.  Psychiatric: She has a normal mood and affect.          Assessment & Plan:  1. Cauda equina syndrome: Continue to Monitor  2. Insomnia: Continue with Trazodone and Xanax  3. 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  Refilled: Percocet 10/325 mg # 90--use 1/2 to 1 pill every 8 hours as needed.  4. Depression: Continue with Celexa. Continue to Monitor  5. Anxiety: Continue with Xanax.  Continue to Monitor 6. Hypotension: PMD Called:  Patient called the office stating" PMD Decreased her antihypertensive medication.  20 minutes of face to face patient care time was spent during this visit. All questions were encouraged and answered.   F/U in 1 month

## 2014-05-02 ENCOUNTER — Encounter: Payer: Self-pay | Admitting: Registered Nurse

## 2014-05-02 ENCOUNTER — Encounter: Payer: Medicare Other | Attending: Physical Medicine and Rehabilitation | Admitting: Registered Nurse

## 2014-05-02 VITALS — BP 110/72 | HR 89 | Resp 14 | Ht 67.0 in | Wt 225.0 lb

## 2014-05-02 DIAGNOSIS — Z5181 Encounter for therapeutic drug level monitoring: Secondary | ICD-10-CM | POA: Diagnosis not present

## 2014-05-02 DIAGNOSIS — G834 Cauda equina syndrome: Secondary | ICD-10-CM

## 2014-05-02 DIAGNOSIS — M545 Low back pain, unspecified: Secondary | ICD-10-CM | POA: Insufficient documentation

## 2014-05-02 DIAGNOSIS — F411 Generalized anxiety disorder: Secondary | ICD-10-CM | POA: Diagnosis not present

## 2014-05-02 DIAGNOSIS — E039 Hypothyroidism, unspecified: Secondary | ICD-10-CM | POA: Insufficient documentation

## 2014-05-02 DIAGNOSIS — I10 Essential (primary) hypertension: Secondary | ICD-10-CM | POA: Diagnosis not present

## 2014-05-02 DIAGNOSIS — M109 Gout, unspecified: Secondary | ICD-10-CM | POA: Diagnosis not present

## 2014-05-02 DIAGNOSIS — F3289 Other specified depressive episodes: Secondary | ICD-10-CM | POA: Insufficient documentation

## 2014-05-02 DIAGNOSIS — F329 Major depressive disorder, single episode, unspecified: Secondary | ICD-10-CM | POA: Diagnosis not present

## 2014-05-02 DIAGNOSIS — Z79899 Other long term (current) drug therapy: Secondary | ICD-10-CM | POA: Diagnosis not present

## 2014-05-02 DIAGNOSIS — M533 Sacrococcygeal disorders, not elsewhere classified: Secondary | ICD-10-CM | POA: Insufficient documentation

## 2014-05-02 DIAGNOSIS — G4700 Insomnia, unspecified: Secondary | ICD-10-CM | POA: Insufficient documentation

## 2014-05-02 MED ORDER — ALPRAZOLAM 0.25 MG PO TABS: 0.2500 mg | ORAL_TABLET | Freq: Two times a day (BID) | ORAL | Status: DC | PRN

## 2014-05-02 MED ORDER — OXYCODONE-ACETAMINOPHEN 10-325 MG PO TABS: 0.5000 | ORAL_TABLET | Freq: Three times a day (TID) | ORAL | Status: DC | PRN

## 2014-05-02 MED ORDER — GABAPENTIN 100 MG PO CAPS: 100.0000 mg | ORAL_CAPSULE | Freq: Three times a day (TID) | ORAL | Status: DC

## 2014-05-02 NOTE — Progress Notes (Signed)
Subjective:    Patient ID: Sherry Wong, female    DOB: [birth year], 58 y.o.   MRN: [id number]  HPI: : Sherry Wong is a 58 year old female who returned for follow up for chronic pain and medication refill. She says her pain is located in her lower back, buttocks and bilateral legs. She rates her pain 5. She says she is scheduled for CT Myelogram at [facility] on 05/18/14. Her pain usually intensifies at night. She says she is always in pain she is just trying to live life in-spite of the pain she is feeling.  Her current exercise regime is walking and pool therapy. She is performing her stretching exercises daily. Has been having a lot of tingling and numbness to lower extremities. We will start gabapentin.  Pain Inventor Average Pain 5 Pain Right Now 5 My pain is sharp, burning, stabbing, tingling and aching  In the last 24 hours, has pain interfered with the following? General activity 6 Relation with others 5 Enjoyment of life 5 What TIME of day is your pain at its worst? morning,night Sleep (in general) Poor  Pain is worse with: walking, bending, standing and some activites Pain improves with: rest and medication Relief from Meds: 4  Mobility walk with assistance use a walker how many minutes can you walk? 15-20 ability to climb steps?  yes do you drive?  yes Do you have any goals in this area?  yes  Function disabled: date disabled 04/30/2012 I need assistance with the following:  household duties and shopping Do you have any goals in this area?  yes  Neuro/Psych weakness numbness trouble walking spasms depression anxiety  Prior Studies Any changes since last visit?  no  Physicians involved in your care Any changes since last visit?  no   Family History  Problem Relation Age of Onset  . Anesthesia problems Sister   . Hypotension Neg Hx   . Malignant hyperthermia Neg Hx   . Pseudochol deficiency Neg Hx   . Anesthesia problems Mother    History    Social History  . Marital Status: Married    Spouse Name: N/A    Number of Children: N/A  . Years of Education: N/A   Social History Main Topics  . Smoking status: Never Smoker   . Smokeless tobacco: Never Used  . Alcohol Use: Yes     Comment: occ beer  . Drug Use: No  . Sexual Activity: Not Currently   Other Topics Concern  . None   Social History Narrative  . None   Past Surgical History  Procedure Laterality Date  . Cysts removed from ovary    . Cholecystectomy    . Dilation and curettage of uterus    . Breast surgery      left breast lumpectomy  . Breast biopsy      right  . Colonoscopy    . Esophagogastroduodenoscopy    . Lumbar laminectomy/decompression microdiscectomy  12/03/2011    Procedure: LUMBAR LAMINECTOMY/DECOMPRESSION MICRODISCECTOMY 2 LEVELS;  Surgeon: Floyce Stakes, MD;  Location: [place] [facility];  Service: Neurosurgery;  Laterality: Right;  Right Lumbar four-five Lumbar five sacral one Foraminotomies  . Transforaminal lumbar interbody fusion (tlif) with pedicle screw fixation 2 level N/A 08/25/2013    Procedure:  Lumbar four-five, Lumbar five-Sacral one Transforaminal Lumbar Interbody Fusion ;  Surgeon: Floyce Stakes, MD;  Location: [place] [facility];  Service: Neurosurgery;  Laterality: N/A;  . Hematoma evacuation N/A 08/25/2013  Procedure: EVACUATION OF LUMBAR HEMATOMA;  Surgeon: Floyce Stakes, MD;  Location: [facility];  Service: Neurosurgery;  Laterality: N/A;  . Placement of lumbar drain N/A 09/01/2013    Procedure: PLACEMENT OF LUMBAR DRAIN;  Surgeon: Floyce Stakes, MD;  Location: [place] [facility];  Service: Neurosurgery;  Laterality: N/A;  . Lumbar wound debridement N/A 09/01/2013    Procedure: LUMBAR WOUND DEBRIDEMENT;  Surgeon: Floyce Stakes, MD;  Location: [facility];  Service: Neurosurgery;  Laterality: N/A;   Past Medical History  Diagnosis Date  . PONV (postoperative nausea and vomiting)   . Hypertension     takes Losartan daily   . Pneumonia     hx of 15+yrs ago  . Chronic back pain     ddd/lumbago,and radiculopathy  . Arthritis   . Gout     hx of  . GERD (gastroesophageal reflux disease)     takes Protonix daily  . H/O hiatal hernia   . Gastric ulcer   . Ulcerative colitis   . IBS (irritable bowel syndrome)   . Hx of colonic polyps   . Urinary incontinence   . Nocturia   . Hypothyroidism     takes Levothyroxine daily  . Depression   . Anxiety     takes celexa daily   BP 110/72  Pulse 89  Resp 14  Ht 5\' 7"  (1.702 m)  Wt 225 lb (102.059 kg)  BMI 35.23 kg/m2  SpO2 98%  Opioid Risk Score:   Fall Risk Score: High Fall Risk (>13 points) (pt educated on fall risk, brochure given to pt previously)    Review of Systems  Genitourinary: Positive for decreased urine volume.  Musculoskeletal: Positive for back pain and gait problem.  Neurological: Positive for weakness and numbness.       Spasms  Psychiatric/Behavioral:       Depression  All other systems reviewed and are negative.      Objective:   Physical Exam  Nursing note and vitals reviewed. Constitutional: She is oriented to person, place, and time. She appears well-developed and well-nourished.  HENT:  Head: Normocephalic and atraumatic.  Neck: Normal range of motion. Neck supple.  Cardiovascular: Normal rate, regular rhythm and normal heart sounds.   Pulmonary/Chest: Effort normal and breath sounds normal.  Musculoskeletal:  Normal Muscle Bulk and Muscle testing Reveals: Upper Extremities: Full ROM and Muscle Strength 5/5 Thoracic and Lumbar Hypersensitivity Lower Extremities: Full ROM and Muscle Strength 5/5 Right Leg Extension Produces Pain into Leg Right Leg Flexion: Produces pain into Lower Lef Left Leg Flexion Produces Pain into the popliteal Fossa Arises from chair with ease Wide Based gait/ Using 4 prong cane   Neurological: She is alert and oriented to person, place, and time.  Skin: Skin is warm and dry.   Psychiatric: She has a normal mood and affect.          Assessment & Plan:  1. Cauda equina syndrome: Continue to Monitor  2. Insomnia: Continue with Trazodone and Xanax  3. 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  Refilled: Percocet 10/325 mg # 90--use 1/2 to 1 pill every 8 hours as needed.  4. Depression: Continue with Celexa. Continue to Monitor  5. Anxiety: Continue with Xanax. Continue to Monitor  20 minutes of face to face patient care time was spent during this visit. All questions were encouraged and answered.   F/U in 1 month

## 2014-05-02 NOTE — Patient Instructions (Addendum)
Start Gabapentin 100 mg once a day for a week  Then  Gabapentin 100 mg twice a day for a week  Then  Gabapentin 100 mg three times a day   Call if you have any questions    [facility] in the Same section as [place]

## 2014-05-27 ENCOUNTER — Encounter (HOSPITAL_BASED_OUTPATIENT_CLINIC_OR_DEPARTMENT_OTHER): Payer: Medicare Other | Admitting: Registered Nurse

## 2014-05-27 ENCOUNTER — Encounter: Payer: Self-pay | Admitting: Registered Nurse

## 2014-05-27 VITALS — BP 134/78 | HR 68 | Resp 14 | Ht 67.0 in | Wt 231.0 lb

## 2014-05-27 DIAGNOSIS — Z79899 Other long term (current) drug therapy: Secondary | ICD-10-CM

## 2014-05-27 DIAGNOSIS — M545 Low back pain, unspecified: Secondary | ICD-10-CM

## 2014-05-27 DIAGNOSIS — M533 Sacrococcygeal disorders, not elsewhere classified: Secondary | ICD-10-CM

## 2014-05-27 DIAGNOSIS — Z5181 Encounter for therapeutic drug level monitoring: Secondary | ICD-10-CM

## 2014-05-27 DIAGNOSIS — G834 Cauda equina syndrome: Secondary | ICD-10-CM | POA: Diagnosis not present

## 2014-05-27 MED ORDER — OXYCODONE-ACETAMINOPHEN 10-325 MG PO TABS: 0.5000 | ORAL_TABLET | Freq: Three times a day (TID) | ORAL | Status: DC | PRN

## 2014-05-27 MED ORDER — METHOCARBAMOL 500 MG PO TABS: 500.0000 mg | ORAL_TABLET | Freq: Four times a day (QID) | ORAL | Status: DC | PRN

## 2014-05-27 NOTE — Progress Notes (Signed)
Patient ID: Sherry Wong, female    DOB: [birth year], 58 y.o.   MRN: [id number]  HPI: Sherry Wong is a 58 year old female who returned for follow up for chronic pain and medication refill. She says her pain is located in her lower back, buttocks and bilateral legs and feet.. She rates her pain 5. She had her CT Myelogram at [facility] on 05/18/14.  She will follow up with Dr. Louanne Skye on 06/01/14 at [facility] current exercise regime is walking, performing stretching exercises  and pool therapy. Still having increased intensity of tingling and numbness will increase gabapentin dose. She verbalizes understanding. May benefit from low dose of cymbalta. She will see Dr. Naaman Plummer next visit and we will wait for his input she verbalizes understanding.  Pain Inventory Average Pain 5 Pain Right Now 5 My pain is sharp, burning, tingling and aching  In the last 24 hours, has pain interfered with the following? General activity 5 Relation with others 5 Enjoyment of life 7 What TIME of day is your pain at its worst? night Sleep (in general) Poor  Pain is worse with: walking, bending, sitting, standing and some activites Pain improves with: rest, heat/ice and medication Relief from Meds: 5  Mobility walk with assistance use a cane use a walker how many minutes can you walk? 20 ability to climb steps?  yes do you drive?  yes Do you have any goals in this area?  yes  Function disabled: date disabled 04/30/2012 retired I need assistance with the following:  household duties and shopping Do you have any goals in this area?  yes  Neuro/Psych bladder control problems bowel control problems weakness numbness tingling trouble walking spasms depression anxiety  Prior Studies Any changes since last visit?  yes CT/MRI  Physicians involved in your care Any changes since last visit?  no   Family History  Problem Relation Age of Onset  . Anesthesia problems Sister   .  Hypotension Neg Hx   . Malignant hyperthermia Neg Hx   . Pseudochol deficiency Neg Hx   . Anesthesia problems Mother    History   Social History  . Marital Status: Married    Spouse Name: N/A    Number of Children: N/A  . Years of Education: N/A   Social History Main Topics  . Smoking status: Never Smoker   . Smokeless tobacco: Never Used  . Alcohol Use: Yes     Comment: occ beer  . Drug Use: No  . Sexual Activity: Not Currently   Other Topics Concern  . None   Social History Narrative  . None   Past Surgical History  Procedure Laterality Date  . Cysts removed from ovary    . Cholecystectomy    . Dilation and curettage of uterus    . Breast surgery      left breast lumpectomy  . Breast biopsy      right  . Colonoscopy    . Esophagogastroduodenoscopy    . Lumbar laminectomy/decompression microdiscectomy  12/03/2011    Procedure: LUMBAR LAMINECTOMY/DECOMPRESSION MICRODISCECTOMY 2 LEVELS;  Surgeon: Floyce Stakes, MD;  Location: [place] [facility];  Service: Neurosurgery;  Laterality: Right;  Right Lumbar four-five Lumbar five sacral one Foraminotomies  . Transforaminal lumbar interbody fusion (tlif) with pedicle screw fixation 2 level N/A 08/25/2013    Procedure:  Lumbar four-five, Lumbar five-Sacral one Transforaminal Lumbar Interbody Fusion ;  Surgeon: Floyce Stakes, MD;  Location: [place]  ORS;  Service: Neurosurgery;  Laterality: N/A;  . Hematoma evacuation N/A 08/25/2013    Procedure: EVACUATION OF LUMBAR HEMATOMA;  Surgeon: Floyce Stakes, MD;  Location: [place] [facility];  Service: Neurosurgery;  Laterality: N/A;  . Placement of lumbar drain N/A 09/01/2013    Procedure: PLACEMENT OF LUMBAR DRAIN;  Surgeon: Floyce Stakes, MD;  Location: [place] [facility];  Service: Neurosurgery;  Laterality: N/A;  . Lumbar wound debridement N/A 09/01/2013    Procedure: LUMBAR WOUND DEBRIDEMENT;  Surgeon: Floyce Stakes, MD;  Location: [facility];  Service: Neurosurgery;  Laterality: N/A;     Past Medical History  Diagnosis Date  . PONV (postoperative nausea and vomiting)   . Hypertension     takes Losartan daily  . Pneumonia     hx of 15+yrs ago  . Chronic back pain     ddd/lumbago,and radiculopathy  . Arthritis   . Gout     hx of  . GERD (gastroesophageal reflux disease)     takes Protonix daily  . H/O hiatal hernia   . Gastric ulcer   . Ulcerative colitis   . IBS (irritable bowel syndrome)   . Hx of colonic polyps   . Urinary incontinence   . Nocturia   . Hypothyroidism     takes Levothyroxine daily  . Depression   . Anxiety     takes celexa daily   BP 134/78  Pulse 68  Resp 14  Ht 5\' 7"  (1.702 m)  Wt 231 lb (104.781 kg)  BMI 36.17 kg/m2  SpO2 99%  Opioid Risk Score:   Fall Risk Score: Moderate Fall Risk (6-13 points) (pt educated declined handout)   Review of Systems  Genitourinary: Positive for decreased urine volume.       Bowel control problems  Musculoskeletal: Positive for back pain and gait problem.  Neurological: Positive for weakness and numbness.       Tingling, spasms  Psychiatric/Behavioral: The patient is nervous/anxious.        Depression  All other systems reviewed and are negative.      Objective:   Physical Exam  Nursing note and vitals reviewed. Constitutional: She is oriented to person, place, and time. She appears well-developed and well-nourished.  HENT:  Head: Normocephalic and atraumatic.  Neck: Normal range of motion. Neck supple.  Cardiovascular: Normal rate and regular rhythm.   Pulmonary/Chest: Effort normal and breath sounds normal.  Musculoskeletal:  Normal Muscle Bulk and Muscle testing reveals: Upper extremities: Full ROM and Muscle strength 5/5 Thoracic Paraspinal Tenderness: T-10- T-12 Lumbar Paraspinal Tenderness: L-3- L-5 Sacral Paraspinal Tenderness: S1 Lower Extremities: Full ROM and Muscle Strength 5/5 Arises from chair with ease Wide based gait Antalgic Gait/ 4 Prong Cane for support   Neurological: She is alert and oriented to person, place, and time.  Skin: Skin is warm and dry.  Psychiatric: She has a normal mood and affect.          Assessment & Plan:  1. Cauda equina syndrome: Continue to Monitor  2. Insomnia: Continue with Trazodone and Xanax  3. 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  Refilled:  Increased Percocet 10/325 mg #100--use 1/2 to 1 pill every 8 hours as needed. May take an extra tablet when pain is sever no more than 4 a day. 4. Depression: Continue with Celexa. Continue to Monitor  5. Anxiety: Continue with Xanax. Continue to Monitor  6.Neuropathy: Increased Gabapentin to two  capsules TID  20 minutes of face to face patient care time was spent during this visit. All questions were encouraged and answered.   F/U in 1 month

## 2014-06-27 ENCOUNTER — Encounter: Payer: Self-pay | Admitting: Physical Medicine & Rehabilitation

## 2014-06-27 ENCOUNTER — Encounter: Payer: Medicare Other | Attending: Physical Medicine and Rehabilitation | Admitting: Physical Medicine & Rehabilitation

## 2014-06-27 VITALS — BP 134/82 | HR 67 | Resp 14 | Wt 233.0 lb

## 2014-06-27 DIAGNOSIS — F329 Major depressive disorder, single episode, unspecified: Secondary | ICD-10-CM | POA: Insufficient documentation

## 2014-06-27 DIAGNOSIS — I10 Essential (primary) hypertension: Secondary | ICD-10-CM | POA: Insufficient documentation

## 2014-06-27 DIAGNOSIS — M5136 Other intervertebral disc degeneration, lumbar region: Secondary | ICD-10-CM

## 2014-06-27 DIAGNOSIS — G4700 Insomnia, unspecified: Secondary | ICD-10-CM | POA: Insufficient documentation

## 2014-06-27 DIAGNOSIS — M51379 Other intervertebral disc degeneration, lumbosacral region without mention of lumbar back pain or lower extremity pain: Secondary | ICD-10-CM

## 2014-06-27 DIAGNOSIS — F3289 Other specified depressive episodes: Secondary | ICD-10-CM | POA: Insufficient documentation

## 2014-06-27 DIAGNOSIS — M533 Sacrococcygeal disorders, not elsewhere classified: Secondary | ICD-10-CM | POA: Insufficient documentation

## 2014-06-27 DIAGNOSIS — F411 Generalized anxiety disorder: Secondary | ICD-10-CM | POA: Insufficient documentation

## 2014-06-27 DIAGNOSIS — G039 Meningitis, unspecified: Secondary | ICD-10-CM

## 2014-06-27 DIAGNOSIS — M109 Gout, unspecified: Secondary | ICD-10-CM | POA: Insufficient documentation

## 2014-06-27 DIAGNOSIS — Z79899 Other long term (current) drug therapy: Secondary | ICD-10-CM | POA: Diagnosis not present

## 2014-06-27 DIAGNOSIS — M545 Low back pain, unspecified: Secondary | ICD-10-CM | POA: Insufficient documentation

## 2014-06-27 DIAGNOSIS — E039 Hypothyroidism, unspecified: Secondary | ICD-10-CM | POA: Diagnosis not present

## 2014-06-27 DIAGNOSIS — G834 Cauda equina syndrome: Secondary | ICD-10-CM

## 2014-06-27 DIAGNOSIS — M5137 Other intervertebral disc degeneration, lumbosacral region: Secondary | ICD-10-CM

## 2014-06-27 DIAGNOSIS — Z5181 Encounter for therapeutic drug level monitoring: Secondary | ICD-10-CM | POA: Insufficient documentation

## 2014-06-27 MED ORDER — NORTRIPTYLINE HCL 25 MG PO CAPS: 25.0000 mg | ORAL_CAPSULE | Freq: Every day | ORAL | Status: DC

## 2014-06-27 MED ORDER — OXYCODONE-ACETAMINOPHEN 10-325 MG PO TABS: 1.0000 | ORAL_TABLET | Freq: Four times a day (QID) | ORAL | Status: DC | PRN

## 2014-06-27 MED ORDER — ALPRAZOLAM 0.25 MG PO TABS: 0.2500 mg | ORAL_TABLET | Freq: Two times a day (BID) | ORAL | Status: DC | PRN

## 2014-06-27 MED ORDER — GABAPENTIN 300 MG PO CAPS: 300.0000 mg | ORAL_CAPSULE | Freq: Three times a day (TID) | ORAL | Status: DC

## 2014-06-27 NOTE — Progress Notes (Signed)
Subjective:    Patient ID: Sherry Wong, female    DOB: 1956/04/19, 58 y.o.   MRN: [id number]  HPI  Sherry Wong is here in follow up of her cauda equina. She got out of the wheelchair in June. She has been using a quad cane. Over the summer she had access to a pool which really helped her mobility and balance.  The main problem is her pain currently. She has a lot of numbness and pain in her feet. She saw Dr. Levy Sjogren at [facility] for a second opinion who does not recommend further surgery---apparently she made a pain mgt referral.   She still has some urinary incontinence and once in a while she has bowel incontinence also.   For pain she is taking 200mg  gabapentin tid. The percocet 10/325 helps for about an hour and then she's back to square one. She's also on robaxin for muscle spasms.   For exercise she walks daily. She gets out with her husband and [place]. She tries to walk to the green and may take a putt.   MRI from this month revealed:  1. Faint, linear, T2 hypointensity extending transversely anterior to posterior within the spinal canal on the sagittal images at the level of L4 on L5, possibly representing an adhesion, corresponding in location to the level of the distal end of the contrast column on the corresponding CT. Alternatively, this could represent the superior aspect of a CSF collection/arachnoid cyst. 2. Nerve roots are located posteriorly within the canal distally beyond the L4-L5 level, which can be seen in the setting of arachnoiditis or could be secondary to displacement from a loculated CSF collection. 3. Post surgical changes of posterior spinal fusion at L4-S1 and decompression. There is a T2 hyperintense collection within the dorsal soft tissues posterior to the level of L5-S1, which could represent postoperative seroma or loculated CSF collection.      Pain Inventory Average Pain 6 Pain Right Now 5 My pain is sharp, burning,  tingling and aching  In the last 24 hours, has pain interfered with the following? General activity 5 Relation with others 5 Enjoyment of life 5 What TIME of day is your pain at its worst? daytime and night Sleep (in general) Poor  Pain is worse with: walking, bending, sitting, standing and some activites Pain improves with: rest and pacing activities Relief from Meds: 4  Mobility walk with assistance use a cane how many minutes can you walk? 20 ability to climb steps?  yes do you drive?  yes  Function disabled: date disabled 2013 I need assistance with the following:  household duties and shopping  Neuro/Psych bladder control problems bowel control problems weakness numbness tingling trouble walking spasms depression anxiety  Prior Studies Any changes since last visit?  no  Physicians involved in your care Any changes since last visit?  no   Family History  Problem Relation Age of Onset  . Anesthesia problems Sister   . Hypotension Neg Hx   . Malignant hyperthermia Neg Hx   . Pseudochol deficiency Neg Hx   . Anesthesia problems Mother    History   Social History  . Marital Status: Married    Spouse Name: N/A    Number of Children: N/A  . Years of Education: N/A   Social History Main Topics  . Smoking status: Never Smoker   . Smokeless tobacco: Never Used  . Alcohol Use: Yes     Comment: occ  beer  . Drug Use: No  . Sexual Activity: Not Currently   Other Topics Concern  . None   Social History Narrative  . None   Past Surgical History  Procedure Laterality Date  . Cysts removed from ovary    . Cholecystectomy    . Dilation and curettage of uterus    . Breast surgery      left breast lumpectomy  . Breast biopsy      right  . Colonoscopy    . Esophagogastroduodenoscopy    . Lumbar laminectomy/decompression microdiscectomy  12/03/2011    Procedure: LUMBAR LAMINECTOMY/DECOMPRESSION MICRODISCECTOMY 2 LEVELS;  Surgeon: Floyce Stakes, MD;   Location: [place] [facility];  Service: Neurosurgery;  Laterality: Right;  Right Lumbar four-five Lumbar five sacral one Foraminotomies  . Transforaminal lumbar interbody fusion (tlif) with pedicle screw fixation 2 level N/A 08/25/2013    Procedure:  Lumbar four-five, Lumbar five-Sacral one Transforaminal Lumbar Interbody Fusion ;  Surgeon: Floyce Stakes, MD;  Location: [place] [facility];  Service: Neurosurgery;  Laterality: N/A;  . Hematoma evacuation N/A 08/25/2013    Procedure: EVACUATION OF LUMBAR HEMATOMA;  Surgeon: Floyce Stakes, MD;  Location: [place] NE[facility] ORS;  Service: Neurosurgery;  Laterality: N/A;  . Placement of lumbar drain N/A 09/01/2013    Procedure: PLACEMENT OF LUMBAR DRAIN;  Surgeon: Floyce Stakes, MD;  Location: [place] [facility];  Service: Neurosurgery;  Laterality: N/A;  . Lumbar wound debridement N/A 09/01/2013    Procedure: LUMBAR WOUND DEBRIDEMENT;  Surgeon: Floyce Stakes, MD;  Location: [facility];  Service: Neurosurgery;  Laterality: N/A;   Past Medical History  Diagnosis Date  . PONV (postoperative nausea and vomiting)   . Hypertension     takes Losartan daily  . Pneumonia     hx of 15+yrs ago  . Chronic back pain     ddd/lumbago,and radiculopathy  . Arthritis   . Gout     hx of  . GERD (gastroesophageal reflux disease)     takes Protonix daily  . H/O hiatal hernia   . Gastric ulcer   . Ulcerative colitis   . IBS (irritable bowel syndrome)   . Hx of colonic polyps   . Urinary incontinence   . Nocturia   . Hypothyroidism     takes Levothyroxine daily  . Depression   . Anxiety     takes celexa daily   BP 134/82  Pulse 67  Resp 14  Wt 233 lb (105.688 kg)  SpO2 97%  Opioid Risk Score:   Fall Risk Score: Moderate Fall Risk (6-13 points) (previously educated and declined handout) Review of Systems  Gastrointestinal: Positive for nausea, abdominal pain, diarrhea and constipation.       Bowel Control Problems  Genitourinary: Positive for difficulty  urinating.       Bladder control problems  Musculoskeletal: Positive for gait problem.       Spasms  Neurological: Positive for weakness and numbness.       Tingling  Psychiatric/Behavioral: Positive for dysphoric mood. The patient is nervous/anxious.   All other systems reviewed and are negative.      Objective:   Physical Exam  Constitutional: She is oriented to person, place, and time. She appears well-developed and well-nourished.  HENT:  Head: Normocephalic and atraumatic.  Neck: Normal range of motion. Neck supple.  Cardiovascular: Normal rate and regular rhythm.  Pulmonary/Chest: Effort normal and breath sounds normal.  Musculoskeletal:  Normal Muscle Bulk and Muscle testing reveals:  Lower Extremities: Full ROM and Muscle Strength 5/5 Arises from chair with ease Antalgic Gait on left---uses cane to unload.. Sensation diminished below the waist except for medial thigh. Left leg more affected than right. DTR's 1+. No resting tone or spasms. Low back ROM limited due to pain and instability Neurological: She is alert and oriented to person, place, and time.  Skin: Skin is warm and dry.  Psychiatric: She has a normal mood and affect. A  Little anxious at times.     Assessment & Plan:   1. Cauda equina syndrome with arachnoiditis: pt with substantial pain and ongoing sensory loss.   2. Insomnia: Continue with Trazodone and Xanax  3. 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  Refilled: Increased Percocet 10/325 mg #100--1 q6 prn.  May need a long acting agent.  4. Depression: Continue with Celexa. Continue to Monitor  5. Anxiety: Continue with Xanax.  refilled  6. Neuropathic pain: Increase gabapentin to 300mg  TID to start -add nortriptyline at night.   -Discussed treatment options moving forward. We should be able to help manage her pain more effectively, but she will still experience long term sequelae from this  problem.  -I did make a referral to outpt PT to work on ROM, desensitization, modalities, HEP, etc     40 minutes of face to face patient care time was spent during this visit. All questions were encouraged and answered.   Will discuss further regarding bowel and bladder issues at another visit.

## 2014-06-27 NOTE — Patient Instructions (Signed)
CONTINUE TO WORK ON RANGE OF MOTION AND ACTIVITIES TO TOLERANCE

## 2014-06-30 ENCOUNTER — Ambulatory Visit (HOSPITAL_COMMUNITY): Payer: Medicare Other | Admitting: Physical Therapy

## 2014-07-25 ENCOUNTER — Other Ambulatory Visit: Payer: Self-pay | Admitting: Physical Medicine & Rehabilitation

## 2014-07-25 ENCOUNTER — Encounter: Payer: Medicare Other | Attending: Physical Medicine and Rehabilitation | Admitting: Registered Nurse

## 2014-07-25 ENCOUNTER — Encounter: Payer: Self-pay | Admitting: Registered Nurse

## 2014-07-25 VITALS — BP 114/73 | HR 82 | Resp 14

## 2014-07-25 DIAGNOSIS — G834 Cauda equina syndrome: Secondary | ICD-10-CM

## 2014-07-25 DIAGNOSIS — Z5181 Encounter for therapeutic drug level monitoring: Secondary | ICD-10-CM | POA: Insufficient documentation

## 2014-07-25 DIAGNOSIS — G039 Meningitis, unspecified: Secondary | ICD-10-CM | POA: Diagnosis present

## 2014-07-25 DIAGNOSIS — Z79899 Other long term (current) drug therapy: Secondary | ICD-10-CM | POA: Diagnosis present

## 2014-07-25 DIAGNOSIS — M5136 Other intervertebral disc degeneration, lumbar region: Secondary | ICD-10-CM | POA: Insufficient documentation

## 2014-07-25 MED ORDER — OXYCODONE-ACETAMINOPHEN 10-325 MG PO TABS: 1.0000 | ORAL_TABLET | Freq: Four times a day (QID) | ORAL | Status: DC | PRN

## 2014-07-25 MED ORDER — MORPHINE SULFATE ER 15 MG PO TBCR: 15.0000 mg | EXTENDED_RELEASE_TABLET | Freq: Two times a day (BID) | ORAL | Status: DC

## 2014-07-25 NOTE — Progress Notes (Signed)
Subjective:    Patient ID: LU PARADISE, female    DOB: October 03, 1955, 58 y.o.   MRN: [id number]  HPI: [name] is a 58 year old female who returned for follow up for chronic pain and medication refill. She says her pain is located in her lower back, buttocks and bilateral legs and feet.She rates her pain 5. Her current exercise regime is golfing, walking, performing stretching exercises and pool therapy at the [facility] three times a week. Still having increased intensity of pain. Spoke with Dr. Naaman Plummer we will start MS Contin 15 mg one tablet every 12 hours. She verbalizes understanding. Husband in the room all questions answered. She's very emotional today, emotional support given and all questions answered.  Pain Inventory Average Pain 5 Pain Right Now 5 My pain is constant, sharp, burning, tingling and aching  In the last 24 hours, has pain interfered with the following? General activity 5 Relation with others 5 Enjoyment of life 5 What TIME of day is your pain at its worst? night Sleep (in general) Fair  Pain is worse with: walking, bending, standing and some activites Pain improves with: rest and medication Relief from Meds: 4  Mobility walk with assistance use a cane  Function disabled: date disabled 12/03/2011  Neuro/Psych bladder control problems bowel control problems weakness numbness tingling trouble walking spasms depression anxiety  Prior Studies Any changes since last visit?  no  Physicians involved in your care Any changes since last visit?  no   Family History  Problem Relation Age of Onset  . Anesthesia problems Sister   . Hypotension Neg Hx   . Malignant hyperthermia Neg Hx   . Pseudochol deficiency Neg Hx   . Anesthesia problems Mother    History   Social History  . Marital Status: Married    Spouse Name: N/A    Number of Children: N/A  . Years of Education: N/A   Social History Main Topics  . Smoking status: Never Smoker     . Smokeless tobacco: Never Used  . Alcohol Use: Yes     Comment: occ beer  . Drug Use: No  . Sexual Activity: Not Currently   Other Topics Concern  . None   Social History Narrative  . None   Past Surgical History  Procedure Laterality Date  . Cysts removed from ovary    . Cholecystectomy    . Dilation and curettage of uterus    . Breast surgery      left breast lumpectomy  . Breast biopsy      right  . Colonoscopy    . Esophagogastroduodenoscopy    . Lumbar laminectomy/decompression microdiscectomy  12/03/2011    Procedure: LUMBAR LAMINECTOMY/DECOMPRESSION MICRODISCECTOMY 2 LEVELS;  Surgeon: Floyce Stakes, MD;  Location: [place] [facility];  Service: Neurosurgery;  Laterality: Right;  Right Lumbar four-five Lumbar five sacral one Foraminotomies  . Transforaminal lumbar interbody fusion (tlif) with pedicle screw fixation 2 level N/A 08/25/2013    Procedure:  Lumbar four-five, Lumbar five-Sacral one Transforaminal Lumbar Interbody Fusion ;  Surgeon: Floyce Stakes, MD;  Location: [place] [facility];  Service: Neurosurgery;  Laterality: N/A;  . Hematoma evacuation N/A 08/25/2013    Procedure: EVACUATION OF LUMBAR HEMATOMA;  Surgeon: Floyce Stakes, MD;  Location: [place] [facility] OR[facility];  Service: Neurosurgery;  Laterality: N/A;  . Placement of lumbar drain N/A 09/01/2013    Procedure: PLACEMENT OF LUMBAR DRAIN;  Surgeon: Floyce Stakes, MD;  Location: [facility] ORS;  Service: Neurosurgery;  Laterality: N/A;  . Lumbar wound debridement N/A 09/01/2013    Procedure: LUMBAR WOUND DEBRIDEMENT;  Surgeon: Floyce Stakes, MD;  Location: [facility];  Service: Neurosurgery;  Laterality: N/A;   Past Medical History  Diagnosis Date  . PONV (postoperative nausea and vomiting)   . Hypertension     takes Losartan daily  . Pneumonia     hx of 15+yrs ago  . Chronic back pain     ddd/lumbago,and radiculopathy  . Arthritis   . Gout     hx of  . GERD (gastroesophageal reflux disease)     takes Protonix  daily  . H/O hiatal hernia   . Gastric ulcer   . Ulcerative colitis   . IBS (irritable bowel syndrome)   . Hx of colonic polyps   . Urinary incontinence   . Nocturia   . Hypothyroidism     takes Levothyroxine daily  . Depression   . Anxiety     takes celexa daily   BP 114/73  Pulse 82  Resp 14  SpO2 98%  Opioid Risk Score:   Fall Risk Score:   Review of Systems     Objective:   Physical Exam  Nursing note and vitals reviewed. Constitutional: She is oriented to person, place, and time. She appears well-developed and well-nourished.  HENT:  Head: Normocephalic and atraumatic.  Neck: Normal range of motion. Neck supple.  Cardiovascular: Normal rate and regular rhythm.   Pulmonary/Chest: Effort normal and breath sounds normal.  Musculoskeletal:  Normal Muscle Bulk and Muscle Testing Reveals: Upper Extremities: Full ROM and Muscle strength 5/5 Thoracic and Lumbar Hypersensitivity Lower Extremities: Full ROM and muscle Strength 5/5 Lower Extremities Flexion Produces Pain into Popliteal Fossa Left Shin with swelling noted  And tenderness with palpation Arises from chair with ease/ using 4 prong cane Wide Based Gait   Neurological: She is alert and oriented to person, place, and time.  Skin: Skin is warm and dry.  Psychiatric: She has a normal mood and affect.          Assessment & Plan:  1. Cauda equina syndrome: Continue to Monitor  2. Insomnia: Continue with Nortriptyline  and Xanax  3. 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  Refilled:  Percocet 10/325 mg #120--use 1 tablet every 6 hours as needed. RX: MS Contin 15 mg one tablet every 12 hours as needed #60. 4. Depression: Continue with Celexa. Continue to Monitor  5. Anxiety: Continue with Xanax. Continue to Monitor  6.Neuropathy: Continue Gabapentin, due to adverse reaction decrease dose to BID. 7. Patellar Tendonitis: Continue with Ice Therapy 8. Muscle  Spasms: Continue Robaxin 20 minutes of face to face patient care time was spent during this visit. All questions were encouraged and answered.

## 2014-07-26 LAB — PMP ALCOHOL METABOLITE (ETG): Ethyl Glucuronide (EtG): NEGATIVE ng/mL

## 2014-07-27 LAB — OPIATES/OPIOIDS (LC/MS-MS)
Codeine Urine: NEGATIVE ng/mL (ref <50)
Hydrocodone: NEGATIVE ng/mL (ref <50)
Hydromorphone: NEGATIVE ng/mL (ref <50)
Morphine Urine: NEGATIVE ng/mL (ref <50)
Norhydrocodone, Ur: NEGATIVE ng/mL (ref <50)
Noroxycodone, Ur: 2626 ng/mL (ref <50)
Oxycodone, ur: 1122 ng/mL (ref <50)
Oxymorphone: 818 ng/mL (ref <50)

## 2014-07-27 LAB — BENZODIAZEPINES (GC/LC/MS), URINE
Alprazolam metabolite (GC/LC/MS), ur confirm: 51 ng/mL (ref <25)
Clonazepam metabolite (GC/LC/MS), ur confirm: NEGATIVE ng/mL (ref <25)
Flurazepam metabolite (GC/LC/MS), ur confirm: NEGATIVE ng/mL (ref <50)
Lorazepam (GC/LC/MS), ur confirm: NEGATIVE ng/mL (ref <50)
Midazolam (GC/LC/MS), ur confirm: NEGATIVE ng/mL (ref <50)
Nordiazepam (GC/LC/MS), ur confirm: NEGATIVE ng/mL (ref <50)
Oxazepam (GC/LC/MS), ur confirm: NEGATIVE ng/mL (ref <50)
Temazepam (GC/LC/MS), ur confirm: NEGATIVE ng/mL (ref <50)
Triazolam metabolite (GC/LC/MS), ur confirm: NEGATIVE ng/mL (ref <50)

## 2014-07-27 LAB — OXYCODONE, URINE (LC/MS-MS)
Noroxycodone, Ur: 2626 ng/mL (ref <50)
Oxycodone, ur: 1122 ng/mL (ref <50)
Oxymorphone: 818 ng/mL (ref <50)

## 2014-07-28 LAB — PRESCRIPTION MONITORING PROFILE (SOLSTAS)
Amphetamine/Meth: NEGATIVE ng/mL
Barbiturate Screen, Urine: NEGATIVE ng/mL
Buprenorphine, Urine: NEGATIVE ng/mL
Cannabinoid Scrn, Ur: NEGATIVE ng/mL
Carisoprodol, Urine: NEGATIVE ng/mL
Cocaine Metabolites: NEGATIVE ng/mL
Creatinine, Urine: 41.56 mg/dL (ref >20.0)
Fentanyl, Ur: NEGATIVE ng/mL
MDMA URINE: NEGATIVE ng/mL
Meperidine, Ur: NEGATIVE ng/mL
Methadone Screen, Urine: NEGATIVE ng/mL
Nitrites, Initial: NEGATIVE ug/mL
Propoxyphene: NEGATIVE ng/mL
Tapentadol, urine: NEGATIVE ng/mL
Tramadol Scrn, Ur: NEGATIVE ng/mL
Zolpidem, Urine: NEGATIVE ng/mL
pH, Initial: 7.2 pH (ref 4.5–8.9)

## 2014-08-11 ENCOUNTER — Telehealth: Payer: Self-pay | Admitting: *Deleted

## 2014-08-11 NOTE — Telephone Encounter (Signed)
Sherry Wong from [facility] called on behalf  of Dr. Alvino Wong requesting a peer to peer conversation with Dr. Naaman Wong regarding patients disability case particularly restrictions and limitations

## 2014-08-12 NOTE — Telephone Encounter (Signed)
i can discuss her case. Johnette---?fee

## 2014-08-15 NOTE — Telephone Encounter (Signed)
rec'd 2nd call from [facility] regarding peer to peer conversation about pt

## 2014-08-22 NOTE — Telephone Encounter (Signed)
Let's find out what they want, please---thanks

## 2014-08-22 NOTE — Telephone Encounter (Signed)
I will make the initial call. Yvone Neu- bring me all the information you have please

## 2014-08-22 NOTE — Telephone Encounter (Signed)
There is no other information

## 2014-08-22 NOTE — Telephone Encounter (Signed)
This is being handled by [employer]

## 2014-08-23 ENCOUNTER — Encounter: Payer: Medicare Other | Attending: Physical Medicine and Rehabilitation | Admitting: Physical Medicine & Rehabilitation

## 2014-08-23 ENCOUNTER — Encounter: Payer: Self-pay | Admitting: Physical Medicine & Rehabilitation

## 2014-08-23 ENCOUNTER — Encounter: Payer: Medicare Other | Admitting: Physical Medicine & Rehabilitation

## 2014-08-23 VITALS — BP 135/83 | HR 87 | Resp 14 | Ht 68.0 in | Wt 238.0 lb

## 2014-08-23 DIAGNOSIS — M5136 Other intervertebral disc degeneration, lumbar region: Secondary | ICD-10-CM | POA: Diagnosis present

## 2014-08-23 DIAGNOSIS — G834 Cauda equina syndrome: Secondary | ICD-10-CM | POA: Diagnosis present

## 2014-08-23 DIAGNOSIS — Z5181 Encounter for therapeutic drug level monitoring: Secondary | ICD-10-CM | POA: Insufficient documentation

## 2014-08-23 DIAGNOSIS — G039 Meningitis, unspecified: Secondary | ICD-10-CM | POA: Diagnosis present

## 2014-08-23 DIAGNOSIS — Z79899 Other long term (current) drug therapy: Secondary | ICD-10-CM | POA: Diagnosis present

## 2014-08-23 MED ORDER — MORPHINE SULFATE ER 30 MG PO TBCR: 30.0000 mg | EXTENDED_RELEASE_TABLET | Freq: Two times a day (BID) | ORAL | Status: DC

## 2014-08-23 MED ORDER — GABAPENTIN 600 MG PO TABS: 600.0000 mg | ORAL_TABLET | Freq: Three times a day (TID) | ORAL | Status: DC

## 2014-08-23 MED ORDER — OXYCODONE-ACETAMINOPHEN 10-325 MG PO TABS: 1.0000 | ORAL_TABLET | Freq: Four times a day (QID) | ORAL | Status: DC | PRN

## 2014-08-23 MED ORDER — NORTRIPTYLINE HCL 50 MG PO CAPS: 50.0000 mg | ORAL_CAPSULE | Freq: Every day | ORAL | Status: DC

## 2014-08-23 NOTE — Patient Instructions (Signed)
GABAPENTIN (MAY INCREASE TODAY) DAYS 1-5: 300-300-600 DAYS 6-10: 600-300-600 DAYS 11+: [phone]  IN 5 DAYS INCREASE NORTRIPTYLINE TO 50MG  DOSE  YOU MAY INCREASE YOUR MORPHINE TODAY

## 2014-08-23 NOTE — Progress Notes (Signed)
Subjective:    Patient ID: Sherry Wong, female    DOB: Jun 04, 1956, 58 y.o.   MRN: [id number]  HPI   Sherry Wong is back regarding her chronic pain and cauda equina syndrome. She hasn't noticed a substantial difference with changes in medications but does admit that they have helped somewhat. She has been going to the [facility] and into the pool which she has found useful for a pain and ROM standpoint. Sherry Wong feels that the exercise has increased her stamina also. She is going 3 days per week on average.   She remains limited with activity tolerance however and has to pace herself accordingly. She is only able to walk short dx before she has to rest.   Her bladder incontinence remains an issue, and she wears undergarments to help with any accidents.  Pain remains most prominent in her low back with radiation to her coccyx and either leg. Right leg tends to bother her the most however.        Pain Inventory Average Pain 5 Pain Right Now 4 My pain is sharp, burning, tingling and aching  In the last 24 hours, has pain interfered with the following? General activity 5 Relation with others 5 Enjoyment of life 5 What TIME of day is your pain at its worst? morning, night Sleep (in general) Fair  Pain is worse with: walking, bending, standing and some activites Pain improves with: rest and medication Relief from Meds: 5  Mobility walk with assistance use a cane how many minutes can you walk? 15-20 ability to climb steps?  yes do you drive?  yes Do you have any goals in this area?  yes  Function not employed: date last employed 11/29/2011 disabled: date disabled 04/30/2012 I need assistance with the following:  household duties and shopping Do you have any goals in this area?  yes  Neuro/Psych bladder control problems bowel control problems weakness numbness tingling trouble walking spasms depression anxiety  Prior Studies Any changes since last visit?  no  Physicians  involved in your care Any changes since last visit?  no   Family History  Problem Relation Age of Onset  . Anesthesia problems Sister   . Hypotension Neg Hx   . Malignant hyperthermia Neg Hx   . Pseudochol deficiency Neg Hx   . Anesthesia problems Mother    History   Social History  . Marital Status: Married    Spouse Name: N/A    Number of Children: N/A  . Years of Education: N/A   Social History Main Topics  . Smoking status: Never Smoker   . Smokeless tobacco: Never Used  . Alcohol Use: Yes     Comment: occ beer  . Drug Use: No  . Sexual Activity: Not Currently   Other Topics Concern  . None   Social History Narrative   Past Surgical History  Procedure Laterality Date  . Cysts removed from ovary    . Cholecystectomy    . Dilation and curettage of uterus    . Breast surgery      left breast lumpectomy  . Breast biopsy      right  . Colonoscopy    . Esophagogastroduodenoscopy    . Lumbar laminectomy/decompression microdiscectomy  12/03/2011    Procedure: LUMBAR LAMINECTOMY/DECOMPRESSION MICRODISCECTOMY 2 LEVELS;  Surgeon: Floyce Stakes, MD;  Location: [place] NEURO ORS;  Service: Neurosurgery;  Laterality: Right;  Right Lumbar four-five Lumbar five sacral one Foraminotomies  . Transforaminal lumbar interbody fusion (tlif)  with pedicle screw fixation 2 level N/A 08/25/2013    Procedure:  Lumbar four-five, Lumbar five-Sacral one Transforaminal Lumbar Interbody Fusion ;  Surgeon: Floyce Stakes, MD;  Location: [place] NEURO ORS;  Service: Neurosurgery;  Laterality: N/A;  . Hematoma evacuation N/A 08/25/2013    Procedure: EVACUATION OF LUMBAR HEMATOMA;  Surgeon: Floyce Stakes, MD;  Location: [place] NEURO ORS;  Service: Neurosurgery;  Laterality: N/A;  . Placement of lumbar drain N/A 09/01/2013    Procedure: PLACEMENT OF LUMBAR DRAIN;  Surgeon: Floyce Stakes, MD;  Location: [place] NEURO ORS;  Service: Neurosurgery;  Laterality: N/A;  . Lumbar wound debridement N/A 09/01/2013     Procedure: LUMBAR WOUND DEBRIDEMENT;  Surgeon: Floyce Stakes, MD;  Location: [facility];  Service: Neurosurgery;  Laterality: N/A;   Past Medical History  Diagnosis Date  . PONV (postoperative nausea and vomiting)   . Hypertension     takes Losartan daily  . Pneumonia     hx of 15+yrs ago  . Chronic back pain     ddd/lumbago,and radiculopathy  . Arthritis   . Gout     hx of  . GERD (gastroesophageal reflux disease)     takes Protonix daily  . H/O hiatal hernia   . Gastric ulcer   . Ulcerative colitis   . IBS (irritable bowel syndrome)   . Hx of colonic polyps   . Urinary incontinence   . Nocturia   . Hypothyroidism     takes Levothyroxine daily  . Depression   . Anxiety     takes celexa daily   BP 135/83 mmHg  Pulse 87  Resp 14  Ht 5\' 8"  (1.727 m)  Wt 238 lb (107.956 kg)  BMI 36.20 kg/m2  SpO2 99%  Opioid Risk Score:   Fall Risk Score: High Fall Risk (>13 points) Review of Systems  Gastrointestinal: Positive for constipation.  Genitourinary: Positive for urgency and frequency.       Incontinence  Neurological: Positive for weakness and numbness.       Tingling Spasms   Psychiatric/Behavioral: Positive for dysphoric mood. The patient is nervous/anxious.   All other systems reviewed and are negative.      Objective:   Physical Exam  Constitutional: She is oriented to person, place, and time. She appears well-developed and well-nourished.  HENT:  Head: Normocephalic and atraumatic.  Neck: Normal range of motion. Neck supple.  Cardiovascular: Normal rate and regular rhythm.  Pulmonary/Chest: Effort normal and breath sounds normal.  Musculoskeletal:  Normal Muscle Bulk and Muscle testing reveals:  Lower Extremities: Full ROM and Muscle Strength 5/5 with pain inhibition noted. Arises from chair with extra time. Antalgic Gait on right more so today---uses cane to unload.. Sensation diminished below the waist except for medial thigh.  DTR's 1+. No  resting tone or spasms. Low back ROM limited due to pain and instability. I was able to flex her to about 50 degrees before she had to stop due to low back pain. Neurological: She is alert and oriented to person, place, and time.  Skin: Skin is warm and dry.  Psychiatric: mood pleasant, overall improved today..     Assessment & Plan:   1. Cauda equina syndrome with arachnoiditis: pt with substantial pain and ongoing sensory loss.  2. Insomnia: Continue with Trazodone and Xanax  3. 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    -refilled: Percocet 10/325 mg #100--1 q6 prn.  -Increased  ms contin to 30mg  q12 #60 4. Depression: Continue with Celexa. Continue to Monitor  5. Anxiety: Continue with Xanax. refilled  6. Neuropathic pain: Increase gabapentin to 600mg  TID to start  -increase nortriptyline to 50mg --may help urinary incontinence also    -maintain aggressie HEP, [employer], pool, etc -30 minutes of face to face patient care time was spent during this visit. All questions were encouraged and answered.   Consider caudal block to decrease pain symptoms. Chinara would like to think about it first as we explore the above options.

## 2014-09-21 ENCOUNTER — Encounter: Payer: Self-pay | Admitting: Registered Nurse

## 2014-09-21 ENCOUNTER — Other Ambulatory Visit: Payer: Self-pay | Admitting: Physical Medicine & Rehabilitation

## 2014-09-21 ENCOUNTER — Encounter: Payer: Medicare Other | Attending: Physical Medicine and Rehabilitation | Admitting: Registered Nurse

## 2014-09-21 VITALS — BP 133/69 | HR 100 | Resp 14

## 2014-09-21 DIAGNOSIS — Z79899 Other long term (current) drug therapy: Secondary | ICD-10-CM | POA: Diagnosis present

## 2014-09-21 DIAGNOSIS — G834 Cauda equina syndrome: Secondary | ICD-10-CM | POA: Diagnosis present

## 2014-09-21 DIAGNOSIS — Z5181 Encounter for therapeutic drug level monitoring: Secondary | ICD-10-CM | POA: Insufficient documentation

## 2014-09-21 DIAGNOSIS — M5136 Other intervertebral disc degeneration, lumbar region: Secondary | ICD-10-CM | POA: Diagnosis present

## 2014-09-21 DIAGNOSIS — G039 Meningitis, unspecified: Secondary | ICD-10-CM | POA: Diagnosis present

## 2014-09-21 DIAGNOSIS — G894 Chronic pain syndrome: Secondary | ICD-10-CM

## 2014-09-21 MED ORDER — ALPRAZOLAM 0.25 MG PO TABS: 0.2500 mg | ORAL_TABLET | Freq: Two times a day (BID) | ORAL | Status: DC | PRN

## 2014-09-21 MED ORDER — MORPHINE SULFATE ER 30 MG PO TBCR: 30.0000 mg | EXTENDED_RELEASE_TABLET | Freq: Two times a day (BID) | ORAL | Status: DC

## 2014-09-21 MED ORDER — OXYCODONE-ACETAMINOPHEN 10-325 MG PO TABS: 1.0000 | ORAL_TABLET | Freq: Four times a day (QID) | ORAL | Status: DC | PRN

## 2014-09-21 NOTE — Progress Notes (Signed)
Subjective:    Patient ID: Sherry Wong, female    DOB: 1956/09/07, 58 y.o.   MRN: [id number]  HPI: Sherry Wong is a 58 year old female who returned for follow up for chronic pain and medication refill. She says her pain is located in her lower back and bilateral lower extremities.She rates her pain 4. Her current exercise regime is attending  the [facility] three times a week. She's using the elliptical for 45 minutes and in the pool for a hour.  She admits to falling on 09/19/2014 she walking and her feet gave out she states. She fell on her knees she was able to get up didn't seek medical attention.  For the last 4-5 days she hasn't felt well coughing and expectorating yellow and green secretions.  Breath sounds Coarse Rhonchi and Wheezing Noted. She admi'ts she doesn't feel well, doesn't want to go to ED. She will go see her PCP.She didn't call her PCP. Husband placed a call to [place] office I spoke with receptionist, they will see her today at [place]. She has audible wheezing noted o2 sat's 99%.  Husband in the room all questions answered. She's having problem's with [facility], office staff will be handling this matter and she verbalizes understanding.   Pain Inventory Average Pain 4 Pain Right Now 4 My pain is sharp, burning, stabbing, tingling and aching  In the last 24 hours, has pain interfered with the following? General activity 8 Relation with others 8 Enjoyment of life 8 What TIME of day is your pain at its worst? evening Sleep (in general) Fair  Pain is worse with: walking, bending, standing and some activites Pain improves with: rest, heat/ice and medication Relief from Meds: 4  Mobility walk with assistance use a cane how many minutes can you walk? 20 ability to climb steps?  yes do you drive?  yes Do you have any goals in this area?  yes  Function disabled: date disabled 04/30/12 I need assistance with the following:  meal prep, household  duties and shopping  Neuro/Psych bladder control problems bowel control problems weakness numbness tingling trouble walking spasms depression anxiety  Prior Studies Any changes since last visit?  no  Physicians involved in your care Any changes since last visit?  no   Family History  Problem Relation Age of Onset  . Anesthesia problems Sister   . Hypotension Neg Hx   . Malignant hyperthermia Neg Hx   . Pseudochol deficiency Neg Hx   . Anesthesia problems Mother    History   Social History  . Marital Status: Married    Spouse Name: N/A    Number of Children: N/A  . Years of Education: N/A   Social History Main Topics  . Smoking status: Never Smoker   . Smokeless tobacco: Never Used  . Alcohol Use: Yes     Comment: occ beer  . Drug Use: No  . Sexual Activity: Not Currently   Other Topics Concern  . None   Social History Narrative   Past Surgical History  Procedure Laterality Date  . Cysts removed from ovary    . Cholecystectomy    . Dilation and curettage of uterus    . Breast surgery      left breast lumpectomy  . Breast biopsy      right  . Colonoscopy    . Esophagogastroduodenoscopy    . Lumbar laminectomy/decompression microdiscectomy  12/03/2011    Procedure: LUMBAR  LAMINECTOMY/DECOMPRESSION MICRODISCECTOMY 2 LEVELS;  Surgeon: Floyce Stakes, MD;  Location: [place] NEURO ORS;  Service: Neurosurgery;  Laterality: Right;  Right Lumbar four-five Lumbar five sacral one Foraminotomies  . Transforaminal lumbar interbody fusion (tlif) with pedicle screw fixation 2 level N/A 08/25/2013    Procedure:  Lumbar four-five, Lumbar five-Sacral one Transforaminal Lumbar Interbody Fusion ;  Surgeon: Floyce Stakes, MD;  Location: [place] NEURO ORS;  Service: Neurosurgery;  Laterality: N/A;  . Hematoma evacuation N/A 08/25/2013    Procedure: EVACUATION OF LUMBAR HEMATOMA;  Surgeon: Floyce Stakes, MD;  Location: [place] NEURO ORS;  Service: Neurosurgery;  Laterality: N/A;  .  Placement of lumbar drain N/A 09/01/2013    Procedure: PLACEMENT OF LUMBAR DRAIN;  Surgeon: Floyce Stakes, MD;  Location: [place] NEURO ORS;  Service: Neurosurgery;  Laterality: N/A;  . Lumbar wound debridement N/A 09/01/2013    Procedure: LUMBAR WOUND DEBRIDEMENT;  Surgeon: Floyce Stakes, MD;  Location: [facility];  Service: Neurosurgery;  Laterality: N/A;   Past Medical History  Diagnosis Date  . PONV (postoperative nausea and vomiting)   . Hypertension     takes Losartan daily  . Pneumonia     hx of 15+yrs ago  . Chronic back pain     ddd/lumbago,and radiculopathy  . Arthritis   . Gout     hx of  . GERD (gastroesophageal reflux disease)     takes Protonix daily  . H/O hiatal hernia   . Gastric ulcer   . Ulcerative colitis   . IBS (irritable bowel syndrome)   . Hx of colonic polyps   . Urinary incontinence   . Nocturia   . Hypothyroidism     takes Levothyroxine daily  . Depression   . Anxiety     takes celexa daily   BP 133/69 mmHg  Pulse 100  Resp 14  SpO2 97%  Opioid Risk Score:   Fall Risk Score: High Fall Risk (>13 points) (pt has rec'd education material during previous visit)2 Review of Systems  Constitutional:       Night sweats  Respiratory: Positive for shortness of breath and wheezing.   Gastrointestinal: Positive for nausea, abdominal pain, diarrhea and constipation.       Bowel control problems  Genitourinary:       Bladder control problems  Musculoskeletal: Positive for gait problem.  Neurological: Positive for weakness and numbness.       Tingling Spasms   Psychiatric/Behavioral: Positive for dysphoric mood. The patient is nervous/anxious.   All other systems reviewed and are negative.      Objective:   Physical Exam  Constitutional: She is oriented to person, place, and time. She appears well-developed and well-nourished.  HENT:  Head: Normocephalic and atraumatic.  Neck: Normal range of motion. Neck supple.  Cardiovascular: Normal rate  and regular rhythm.   Pulmonary/Chest: She has wheezes.  Coarse Rhonchi  Musculoskeletal:  Normal Muscle Bulk and Muscle Testing Reveals: Upper Extremities: Full ROM and Muscle strength 5/5 Lumbar Paraspinal tenderness: L-3- L-5 Lower Extremities: Full ROM and Muscle strength 5/5 Arises from chair with ease Using 4 Prong Cane for support   Neurological: She is alert and oriented to person, place, and time.  Skin: Skin is warm and dry.  Psychiatric: She has a normal mood and affect.  Nursing note and vitals reviewed.         Assessment & Plan:  1. Cauda equina syndrome: Continue to Monitor  2. Insomnia: Continue with Nortriptyline and  Xanax  3. 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  Refilled: Percocet 10/325 mg #120--use 1 tablet every 6 hours as needed. RX: MS Contin 30 mg one tablet every 12 hours as needed #60. 4. Depression: Continue with Celexa. Continue to Monitor  5. Anxiety: Continue with Xanax. Continue to Monitor  6.Neuropathy: Continue Gabapentin 7. Bronchitis?: PCP appointment  20 minutes of face to face patient care time was spent during this visit. All questions were encouraged and answered.

## 2014-09-22 LAB — PMP ALCOHOL METABOLITE (ETG): Ethyl Glucuronide (EtG): NEGATIVE ng/mL

## 2014-09-25 LAB — BENZODIAZEPINES (GC/LC/MS), URINE
Alprazolam metabolite (GC/LC/MS), ur confirm: 194 ng/mL (ref <25)
Clonazepam metabolite (GC/LC/MS), ur confirm: NEGATIVE ng/mL (ref <25)
Flurazepam metabolite (GC/LC/MS), ur confirm: NEGATIVE ng/mL (ref <50)
Lorazepam (GC/LC/MS), ur confirm: NEGATIVE ng/mL (ref <50)
Midazolam (GC/LC/MS), ur confirm: NEGATIVE ng/mL (ref <50)
Nordiazepam (GC/LC/MS), ur confirm: NEGATIVE ng/mL (ref <50)
Oxazepam (GC/LC/MS), ur confirm: NEGATIVE ng/mL (ref <50)
Temazepam (GC/LC/MS), ur confirm: NEGATIVE ng/mL (ref <50)
Triazolam metabolite (GC/LC/MS), ur confirm: NEGATIVE ng/mL (ref <50)

## 2014-09-25 LAB — MEPERIDINE (GC/LC/MS), URINE
Meperidine (GC/LC/MS), ur confirm: NEGATIVE ng/mL (ref <100)
Normeperidine (GC/LC/MS), ur confirm: NEGATIVE ng/mL (ref <100)

## 2014-09-25 LAB — OPIATES/OPIOIDS (LC/MS-MS)
Codeine Urine: NEGATIVE ng/mL (ref <50)
Hydrocodone: NEGATIVE ng/mL (ref <50)
Hydromorphone: 146 ng/mL (ref <50)
Morphine Urine: 22180 ng/mL (ref <50)
Norhydrocodone, Ur: NEGATIVE ng/mL (ref <50)
Noroxycodone, Ur: 8591 ng/mL (ref <50)
Oxycodone, ur: 1236 ng/mL (ref <50)
Oxymorphone: 480 ng/mL (ref <50)

## 2014-09-25 LAB — OXYCODONE, URINE (LC/MS-MS)
Noroxycodone, Ur: 8591 ng/mL (ref <50)
Oxycodone, ur: 1236 ng/mL (ref <50)
Oxymorphone: 480 ng/mL (ref <50)

## 2014-09-28 LAB — PRESCRIPTION MONITORING PROFILE (SOLSTAS)
Amphetamine/Meth: NEGATIVE ng/mL
Barbiturate Screen, Urine: NEGATIVE ng/mL
Buprenorphine, Urine: NEGATIVE ng/mL
Cannabinoid Scrn, Ur: NEGATIVE ng/mL
Carisoprodol, Urine: NEGATIVE ng/mL
Cocaine Metabolites: NEGATIVE ng/mL
Creatinine, Urine: 172.22 mg/dL (ref >20.0)
Fentanyl, Ur: NEGATIVE ng/mL
MDMA URINE: NEGATIVE ng/mL
Methadone Screen, Urine: NEGATIVE ng/mL
Nitrites, Initial: NEGATIVE ug/mL
Propoxyphene: NEGATIVE ng/mL
Tapentadol, urine: NEGATIVE ng/mL
Tramadol Scrn, Ur: NEGATIVE ng/mL
Zolpidem, Urine: NEGATIVE ng/mL
pH, Initial: 5.6 pH (ref 4.5–8.9)

## 2014-10-05 DIAGNOSIS — E039 Hypothyroidism, unspecified: Secondary | ICD-10-CM | POA: Diagnosis not present

## 2014-10-05 DIAGNOSIS — K219 Gastro-esophageal reflux disease without esophagitis: Secondary | ICD-10-CM | POA: Diagnosis not present

## 2014-10-05 DIAGNOSIS — I10 Essential (primary) hypertension: Secondary | ICD-10-CM | POA: Diagnosis not present

## 2014-10-05 DIAGNOSIS — E876 Hypokalemia: Secondary | ICD-10-CM | POA: Diagnosis not present

## 2014-10-05 NOTE — Progress Notes (Signed)
Urine drug screen for this encounter is consistent for prescribed medications.   

## 2014-10-12 DIAGNOSIS — F329 Major depressive disorder, single episode, unspecified: Secondary | ICD-10-CM | POA: Diagnosis not present

## 2014-10-12 DIAGNOSIS — E039 Hypothyroidism, unspecified: Secondary | ICD-10-CM | POA: Diagnosis not present

## 2014-10-12 DIAGNOSIS — I10 Essential (primary) hypertension: Secondary | ICD-10-CM | POA: Diagnosis not present

## 2014-10-12 DIAGNOSIS — M545 Low back pain: Secondary | ICD-10-CM | POA: Diagnosis not present

## 2014-10-12 DIAGNOSIS — Z1389 Encounter for screening for other disorder: Secondary | ICD-10-CM | POA: Diagnosis not present

## 2014-10-12 DIAGNOSIS — M4800 Spinal stenosis, site unspecified: Secondary | ICD-10-CM | POA: Diagnosis not present

## 2014-10-14 ENCOUNTER — Telehealth: Payer: Self-pay | Admitting: *Deleted

## 2014-10-14 NOTE — Telephone Encounter (Signed)
Pt called asking for an update on paperwork to reinstate long term disability with [employer], pt informs that her new case manager is Roxana Hires and Fax's can be sent to 1 [phone]

## 2014-10-14 NOTE — Telephone Encounter (Signed)
Pt called asking for update on paperwork regarding her reinstatement with disability insurance with [employer] [facility] [facility] [employer], patient informs she has a new case manager: Roxana Hires Fax# [phone]

## 2014-10-17 NOTE — Telephone Encounter (Signed)
Patient called - new adjuster for [employer] case Merry Proud [phone]) asked for [place] to return call so she can find out status

## 2014-10-19 NOTE — Telephone Encounter (Signed)
Sherry Wong. The number given is a fax number. Unable to return his call.

## 2014-10-19 NOTE — Telephone Encounter (Signed)
Spoke to [employer] [phone].  Stated that if we have an additional notes and/or comments to please submit them.

## 2014-10-24 ENCOUNTER — Encounter: Payer: Self-pay | Admitting: Registered Nurse

## 2014-10-24 ENCOUNTER — Encounter: Payer: Medicare Other | Attending: Physical Medicine and Rehabilitation | Admitting: Registered Nurse

## 2014-10-24 ENCOUNTER — Other Ambulatory Visit: Payer: Self-pay | Admitting: Physical Medicine & Rehabilitation

## 2014-10-24 VITALS — BP 129/77 | HR 100 | Resp 14

## 2014-10-24 DIAGNOSIS — G834 Cauda equina syndrome: Secondary | ICD-10-CM | POA: Insufficient documentation

## 2014-10-24 DIAGNOSIS — Z5181 Encounter for therapeutic drug level monitoring: Secondary | ICD-10-CM | POA: Diagnosis not present

## 2014-10-24 DIAGNOSIS — M961 Postlaminectomy syndrome, not elsewhere classified: Secondary | ICD-10-CM

## 2014-10-24 DIAGNOSIS — G894 Chronic pain syndrome: Secondary | ICD-10-CM | POA: Diagnosis not present

## 2014-10-24 DIAGNOSIS — Z79899 Other long term (current) drug therapy: Secondary | ICD-10-CM | POA: Diagnosis not present

## 2014-10-24 DIAGNOSIS — M5136 Other intervertebral disc degeneration, lumbar region: Secondary | ICD-10-CM

## 2014-10-24 DIAGNOSIS — G039 Meningitis, unspecified: Secondary | ICD-10-CM | POA: Diagnosis not present

## 2014-10-24 MED ORDER — MORPHINE SULFATE ER 30 MG PO TBCR
30.0000 mg | EXTENDED_RELEASE_TABLET | Freq: Two times a day (BID) | ORAL | Status: DC
Start: 2014-10-24 — End: 2014-11-23

## 2014-10-24 MED ORDER — OXYCODONE-ACETAMINOPHEN 10-325 MG PO TABS: 1.0000 | ORAL_TABLET | Freq: Four times a day (QID) | ORAL | Status: DC | PRN

## 2014-10-24 MED ORDER — BACLOFEN 10 MG PO TABS: 10.0000 mg | ORAL_TABLET | Freq: Three times a day (TID) | ORAL | Status: DC

## 2014-10-24 NOTE — Patient Instructions (Addendum)
Baclofen: Take one tablet once a day for a week if muscle spasm persists increase dose to   Baclofen twice a day for a week if muscle spasms persists increase dose to   Baclofen three times a day   Call the office with any questions.

## 2014-10-24 NOTE — Progress Notes (Signed)
Subjective:    Patient ID: Sherry Wong, female    DOB: [birth year], 59 y.o.   MRN: [id number]  HPI: Sherry Wong is a 59 year old female who returned for follow up for chronic pain and medication refill. She says her pain is located in her lower back also having muscle spasms in her extremities. Methocarbamol was denied by [facility]. Baclofen ordered today.She rates her pain 5.  Also, facial grimacing noted with movement and palpation to lumbar region and lower extremities flexion. Her current exercise regime is attending the [facility] three times a week. She's using the stationary bicycle elliptical and pool therapy.   Sherry Wong has the [place]  to stand 10 minutes at a time and  stand for 20 minutes at a time. She's having neuropathy pain into lower extremities encouraged to continue using gabapentin.  Due to the intensity of pain her husbands performs  housework,cooking and shopping. She uses a 4 prong cane due to coordination imbalance. Also wears depends for incontinence related to Cauda equina syndrome with neurogenic bladder.   Husband in the room all questions answered.  Pain Inventory Average Pain 5 Pain Right Now 5 My pain is burning, stabbing, tingling and aching  In the last 24 hours, has pain interfered with the following? General activity 6 Relation with others 6 Enjoyment of life 6 What TIME of day is your pain at its worst? daytime, night  Sleep (in general) Fair  Pain is worse with: walking, bending, standing and some activites Pain improves with: rest, heat/ice and medication Relief from Meds: 5  Mobility walk with assistance use a cane use a walker how many minutes can you walk? 20 ability to climb steps?  yes do you drive?  yes Do you have any goals in this area?  yes  Function not employed: date last employed 12/03/2011 disabled: date disabled 04/30/2012 I need assistance with the following:  meal prep, household duties and shopping Do  you have any goals in this area?  yes  Neuro/Psych bladder control problems bowel control problems weakness numbness tingling trouble walking spasms dizziness depression anxiety  Prior Studies Any changes since last visit?  no  Physicians involved in your care Any changes since last visit?  no   Family History  Problem Relation Age of Onset  . Anesthesia problems Sister   . Hypotension Neg Hx   . Malignant hyperthermia Neg Hx   . Pseudochol deficiency Neg Hx   . Anesthesia problems Mother    History   Social History  . Marital Status: Married    Spouse Name: N/A    Number of Children: N/A  . Years of Education: N/A   Social History Main Topics  . Smoking status: Never Smoker   . Smokeless tobacco: Never Used  . Alcohol Use: Yes     Comment: occ beer  . Drug Use: No  . Sexual Activity: Not Currently   Other Topics Concern  . None   Social History Narrative   Past Surgical History  Procedure Laterality Date  . Cysts removed from ovary    . Cholecystectomy    . Dilation and curettage of uterus    . Breast surgery      left breast lumpectomy  . Breast biopsy      right  . Colonoscopy    . Esophagogastroduodenoscopy    . Lumbar laminectomy/decompression microdiscectomy  12/03/2011    Procedure: LUMBAR LAMINECTOMY/DECOMPRESSION MICRODISCECTOMY 2 LEVELS;  Surgeon: Zigmund Daniel  Joya Salm, MD;  Location: [place] NEURO ORS;  Service: Neurosurgery;  Laterality: Right;  Right Lumbar four-five Lumbar five sacral one Foraminotomies  . Transforaminal lumbar interbody fusion (tlif) with pedicle screw fixation 2 level N/A 08/25/2013    Procedure:  Lumbar four-five, Lumbar five-Sacral one Transforaminal Lumbar Interbody Fusion ;  Surgeon: Floyce Stakes, MD;  Location: [place] NEURO ORS;  Service: Neurosurgery;  Laterality: N/A;  . Hematoma evacuation N/A 08/25/2013    Procedure: EVACUATION OF LUMBAR HEMATOMA;  Surgeon: Floyce Stakes, MD;  Location: [place] NEURO ORS;  Service:  Neurosurgery;  Laterality: N/A;  . Placement of lumbar drain N/A 09/01/2013    Procedure: PLACEMENT OF LUMBAR DRAIN;  Surgeon: Floyce Stakes, MD;  Location: [place] NEURO ORS;  Service: Neurosurgery;  Laterality: N/A;  . Lumbar wound debridement N/A 09/01/2013    Procedure: LUMBAR WOUND DEBRIDEMENT;  Surgeon: Floyce Stakes, MD;  Location: [facility];  Service: Neurosurgery;  Laterality: N/A;   Past Medical History  Diagnosis Date  . PONV (postoperative nausea and vomiting)   . Hypertension     takes Losartan daily  . Pneumonia     hx of 15+yrs ago  . Chronic back pain     ddd/lumbago,and radiculopathy  . Arthritis   . Gout     hx of  . GERD (gastroesophageal reflux disease)     takes Protonix daily  . H/O hiatal hernia   . Gastric ulcer   . Ulcerative colitis   . IBS (irritable bowel syndrome)   . Hx of colonic polyps   . Urinary incontinence   . Nocturia   . Hypothyroidism     takes Levothyroxine daily  . Depression   . Anxiety     takes celexa daily   BP 129/77 mmHg  Pulse 100  Resp 14  SpO2 98%  Opioid Risk Score:   Fall Risk Score: High Fall Risk (>13 points) Review of Systems  Gastrointestinal:       Bowel control problems   Genitourinary:       Bladder control problems  Musculoskeletal: Positive for gait problem.  Neurological: Positive for dizziness, weakness and numbness.       Tingling  Spasms   Psychiatric/Behavioral: Positive for dysphoric mood. The patient is nervous/anxious.   All other systems reviewed and are negative.      Objective:   Physical Exam  Constitutional: She is oriented to person, place, and time. She appears well-developed and well-nourished.  HENT:  Head: Normocephalic and atraumatic.  Neck: Normal range of motion. Neck supple.  Cardiovascular: Normal rate and regular rhythm.   Pulmonary/Chest: Effort normal and breath sounds normal.  Musculoskeletal:  Normal Muscle Bulk and Muscle Testing Reveals: Upper Extremities:   Full ROM and Muscle strength 4/5 Extension Produces Spasticity to Upper Extremities Lumbar Paraspinal Tenderness: L-3- L-5 Lower Extremities: Full ROM and Muscle strength 4/5 Spasticity with Lower extremities extension  Arises from chair with slight difficulty/ Using 4 prong cane for support. Antalgic Gait   Neurological: She is alert and oriented to person, place, and time.  Skin: Skin is warm and dry.  Psychiatric: She has a normal mood and affect.  Nursing note and vitals reviewed.         Assessment & Plan:  1. Cauda equina syndrome: Incontinence: Wearing Depends:Continue to Monitor  2. Insomnia: Continue with Nortriptyline and Xanax  3. 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  Refilled: Percocet 10/325 mg #  120--use 1 tablet every 6 hours as needed and  MS Contin 30 mg one tablet every 12 hours as needed #60. 4. Depression: Continue with Celexa. Continue to Monitor  5. Anxiety: Continue with Xanax. Continue to Monitor  6.Neuropathy: Continue Gabapentin  45 minutes of face to face patient care time was spent during this visit. All questions were encouraged and answered.

## 2014-10-25 LAB — PMP ALCOHOL METABOLITE (ETG): Ethyl Glucuronide (EtG): NEGATIVE ng/mL

## 2014-10-27 LAB — OXYCODONE, URINE (LC/MS-MS)
Noroxycodone, Ur: 522 ng/mL (ref <50)
Oxycodone, ur: 339 ng/mL (ref <50)
Oxymorphone: 307 ng/mL (ref <50)

## 2014-10-27 LAB — OPIATES/OPIOIDS (LC/MS-MS)
Codeine Urine: NEGATIVE ng/mL (ref <50)
Hydrocodone: NEGATIVE ng/mL (ref <50)
Hydromorphone: 138 ng/mL (ref <50)
Morphine Urine: 15833 ng/mL (ref <50)
Norhydrocodone, Ur: NEGATIVE ng/mL (ref <50)
Noroxycodone, Ur: 522 ng/mL (ref <50)
Oxycodone, ur: 339 ng/mL (ref <50)
Oxymorphone: 307 ng/mL (ref <50)

## 2014-10-28 LAB — PRESCRIPTION MONITORING PROFILE (SOLSTAS)
Amphetamine/Meth: NEGATIVE ng/mL
Barbiturate Screen, Urine: NEGATIVE ng/mL
Benzodiazepine Screen, Urine: NEGATIVE ng/mL
Buprenorphine, Urine: NEGATIVE ng/mL
Cannabinoid Scrn, Ur: NEGATIVE ng/mL
Carisoprodol, Urine: NEGATIVE ng/mL
Cocaine Metabolites: NEGATIVE ng/mL
Creatinine, Urine: 28.27 mg/dL (ref >20.0)
Fentanyl, Ur: NEGATIVE ng/mL
MDMA URINE: NEGATIVE ng/mL
Meperidine, Ur: NEGATIVE ng/mL
Methadone Screen, Urine: NEGATIVE ng/mL
Nitrites, Initial: NEGATIVE ug/mL
Propoxyphene: NEGATIVE ng/mL
Tapentadol, urine: NEGATIVE ng/mL
Tramadol Scrn, Ur: NEGATIVE ng/mL
Zolpidem, Urine: NEGATIVE ng/mL
pH, Initial: 5.2 pH (ref 4.5–8.9)

## 2014-11-01 NOTE — Progress Notes (Addendum)
Urine drug screen for this encounter is consistent for prescribed narcotic medication.  Alprazolam reported taken 10/23/14  Test done 10/24/14.  No presence of drug or metabolite

## 2014-11-23 ENCOUNTER — Encounter: Payer: Self-pay | Admitting: Registered Nurse

## 2014-11-23 ENCOUNTER — Encounter: Payer: Medicare Other | Attending: Physical Medicine and Rehabilitation | Admitting: Registered Nurse

## 2014-11-23 VITALS — BP 124/82 | HR 94 | Resp 14

## 2014-11-23 DIAGNOSIS — G039 Meningitis, unspecified: Secondary | ICD-10-CM | POA: Diagnosis not present

## 2014-11-23 DIAGNOSIS — Z79899 Other long term (current) drug therapy: Secondary | ICD-10-CM | POA: Diagnosis not present

## 2014-11-23 DIAGNOSIS — G834 Cauda equina syndrome: Secondary | ICD-10-CM | POA: Insufficient documentation

## 2014-11-23 DIAGNOSIS — M5136 Other intervertebral disc degeneration, lumbar region: Secondary | ICD-10-CM

## 2014-11-23 DIAGNOSIS — G894 Chronic pain syndrome: Secondary | ICD-10-CM

## 2014-11-23 DIAGNOSIS — Z5181 Encounter for therapeutic drug level monitoring: Secondary | ICD-10-CM | POA: Diagnosis not present

## 2014-11-23 DIAGNOSIS — K5909 Other constipation: Secondary | ICD-10-CM

## 2014-11-23 DIAGNOSIS — M961 Postlaminectomy syndrome, not elsewhere classified: Secondary | ICD-10-CM

## 2014-11-23 MED ORDER — OXYCODONE-ACETAMINOPHEN 10-325 MG PO TABS: 1.0000 | ORAL_TABLET | Freq: Four times a day (QID) | ORAL | Status: DC | PRN

## 2014-11-23 MED ORDER — LINACLOTIDE 145 MCG PO CAPS: 145.0000 ug | ORAL_CAPSULE | Freq: Every day | ORAL | Status: DC

## 2014-11-23 MED ORDER — MORPHINE SULFATE ER 30 MG PO TBCR: 30.0000 mg | EXTENDED_RELEASE_TABLET | Freq: Two times a day (BID) | ORAL | Status: DC

## 2014-11-23 NOTE — Progress Notes (Signed)
Subjective:    Patient ID: Sherry Wong, female    DOB: [birth year], 59 y.o.   MRN: [id number]  HPI: [name] is a 59 year old female who returned for follow up for chronic pain and medication refill. She says her pain is located in her lower back also having She rates her pain 5. Her current exercise regime is attending the [facility] three times a week. She's using the stationary bicycle elliptical and pool therapy three days a week. Attended a water aerobic class last week.  Pain Inventory Average Pain 5 Pain Right Now 5 My pain is sharp, burning, stabbing, tingling and aching  In the last 24 hours, has pain interfered with the following? General activity 5 Relation with others 5 Enjoyment of life 5 What TIME of day is your pain at its worst? morning and night Sleep (in general) Fair  Pain is worse with: walking, bending, standing and some activites Pain improves with: rest, heat/ice and medication Relief from Meds: 5  Mobility use a cane how many minutes can you walk? 15 ability to climb steps?  yes do you drive?  yes  Function disabled: date disabled 12/03/2011 I need assistance with the following:  bathing, meal prep, household duties and shopping  Neuro/Psych bladder control problems bowel control problems weakness numbness tingling trouble walking spasms depression anxiety  Prior Studies Any changes since last visit?  no  Physicians involved in your care Any changes since last visit?  no   Family History  Problem Relation Age of Onset  . Anesthesia problems Sister   . Hypotension Neg Hx   . Malignant hyperthermia Neg Hx   . Pseudochol deficiency Neg Hx   . Anesthesia problems Mother    History   Social History  . Marital Status: Married    Spouse Name: N/A  . Number of Children: N/A  . Years of Education: N/A   Social History Main Topics  . Smoking status: Never Smoker   . Smokeless tobacco: Never Used  . Alcohol Use: Yes   Comment: occ beer  . Drug Use: No  . Sexual Activity: Not Currently   Other Topics Concern  . None   Social History Narrative   Past Surgical History  Procedure Laterality Date  . Cysts removed from ovary    . Cholecystectomy    . Dilation and curettage of uterus    . Breast surgery      left breast lumpectomy  . Breast biopsy      right  . Colonoscopy    . Esophagogastroduodenoscopy    . Lumbar laminectomy/decompression microdiscectomy  12/03/2011    Procedure: LUMBAR LAMINECTOMY/DECOMPRESSION MICRODISCECTOMY 2 LEVELS;  Surgeon: Floyce Stakes, MD;  Location: [place] [facility];  Service: Neurosurgery;  Laterality: Right;  Right Lumbar four-five Lumbar five sacral one Foraminotomies  . Transforaminal lumbar interbody fusion (tlif) with pedicle screw fixation 2 level N/A 08/25/2013    Procedure:  Lumbar four-five, Lumbar five-Sacral one Transforaminal Lumbar Interbody Fusion ;  Surgeon: Floyce Stakes, MD;  Location: [place] [facility];  Service: Neurosurgery;  Laterality: N/A;  . Hematoma evacuation N/A 08/25/2013    Procedure: EVACUATION OF LUMBAR HEMATOMA;  Surgeon: Floyce Stakes, MD;  Location: [place] [facility];  Service: Neurosurgery;  Laterality: N/A;  . Placement of lumbar drain N/A 09/01/2013    Procedure: PLACEMENT OF LUMBAR DRAIN;  Surgeon: Floyce Stakes, MD;  Location: [place] [facility];  Service: Neurosurgery;  Laterality: N/A;  .  Lumbar wound debridement N/A 09/01/2013    Procedure: LUMBAR WOUND DEBRIDEMENT;  Surgeon: Floyce Stakes, MD;  Location: [place] [facility];  Service: Neurosurgery;  Laterality: N/A;   Past Medical History  Diagnosis Date  . PONV (postoperative nausea and vomiting)   . Hypertension     takes Losartan daily  . Pneumonia     hx of 15+yrs ago  . Chronic back pain     ddd/lumbago,and radiculopathy  . Arthritis   . Gout     hx of  . GERD (gastroesophageal reflux disease)     takes Protonix daily  . H/O hiatal hernia   . Gastric ulcer   . Ulcerative  colitis   . IBS (irritable bowel syndrome)   . Hx of colonic polyps   . Urinary incontinence   . Nocturia   . Hypothyroidism     takes Levothyroxine daily  . Depression   . Anxiety     takes celexa daily   BP 124/82 mmHg  Pulse 94  Resp 14  SpO2 97%  Opioid Risk Score:   Fall Risk Score: High Fall Risk (>13 points) (previously educated but declined handout on falll prevention in the home) Review of Systems  Constitutional: Positive for diaphoresis and unexpected weight change.  Gastrointestinal: Positive for nausea and constipation.  Genitourinary: Positive for difficulty urinating.  Musculoskeletal: Positive for gait problem.       Spasms  Neurological:       Tingling  Psychiatric/Behavioral: Positive for dysphoric mood. The patient is nervous/anxious.   All other systems reviewed and are negative.      Objective:   Physical Exam  Constitutional: She is oriented to person, place, and time. She appears well-developed and well-nourished.  HENT:  Head: Normocephalic and atraumatic.  Neck: Normal range of motion. Neck supple.  Cardiovascular: Normal rate and regular rhythm.   Pulmonary/Chest: Effort normal and breath sounds normal.  Musculoskeletal:  Normal Muscle Bulk and Muscle Testing Reveals: Upper Extremities: Full ROM and Muscle strength 5/5 Lumbar Paraspinal Tenderness: L-3- L-5 Lower Extremities: Full ROM and Muscle Strength 5/5 Bilateral Lower Extremities Flexion Produces pain into Patella's No swelling noted. Arises from chair with ease/ Using 4 Prong Cane    Neurological: She is alert and oriented to person, place, and time.  Skin: Skin is warm and dry.  Psychiatric: She has a normal mood and affect.  Nursing note and vitals reviewed.         Assessment & Plan:  1. Cauda equina syndrome: Incontinence: Wearing Depends:Continue to Monitor  2. Insomnia: Continue with Nortriptyline and Xanax  3. DDD L4-S1 s/p diskectomy with decompression of thecal  sac: She continues to have back pain.  Refilled: Percocet 10/325 mg #120--use 1 tablet every 6 hours as needed and MS Contin 30 mg one tablet every 12 hours as needed #60. 4. Depression: Continue with Celexa. Continue to Monitor  5. Anxiety: Continue with Xanax. Continue to Monitor  6.Neuropathy: Continue Gabapentin 7. Constipation: Senekot in-effective/ RX: Linzess  20 minutes of face to face patient care time was spent during this visit. All questions were encouraged and answered.

## 2014-11-25 DIAGNOSIS — E039 Hypothyroidism, unspecified: Secondary | ICD-10-CM | POA: Diagnosis not present

## 2014-12-05 ENCOUNTER — Encounter: Payer: Self-pay | Admitting: Physical Medicine & Rehabilitation

## 2014-12-21 ENCOUNTER — Encounter: Payer: Medicare Other | Attending: Physical Medicine and Rehabilitation | Admitting: Registered Nurse

## 2014-12-21 ENCOUNTER — Encounter: Payer: Self-pay | Admitting: Registered Nurse

## 2014-12-21 VITALS — BP 134/86 | HR 92 | Resp 14

## 2014-12-21 DIAGNOSIS — Z79899 Other long term (current) drug therapy: Secondary | ICD-10-CM | POA: Insufficient documentation

## 2014-12-21 DIAGNOSIS — M6249 Contracture of muscle, multiple sites: Secondary | ICD-10-CM

## 2014-12-21 DIAGNOSIS — M62838 Other muscle spasm: Secondary | ICD-10-CM

## 2014-12-21 DIAGNOSIS — Z5181 Encounter for therapeutic drug level monitoring: Secondary | ICD-10-CM

## 2014-12-21 DIAGNOSIS — R2681 Unsteadiness on feet: Secondary | ICD-10-CM

## 2014-12-21 DIAGNOSIS — M5136 Other intervertebral disc degeneration, lumbar region: Secondary | ICD-10-CM | POA: Insufficient documentation

## 2014-12-21 DIAGNOSIS — G834 Cauda equina syndrome: Secondary | ICD-10-CM | POA: Diagnosis not present

## 2014-12-21 DIAGNOSIS — G039 Meningitis, unspecified: Secondary | ICD-10-CM | POA: Insufficient documentation

## 2014-12-21 MED ORDER — OXYCODONE-ACETAMINOPHEN 10-325 MG PO TABS: 1.0000 | ORAL_TABLET | Freq: Four times a day (QID) | ORAL | Status: DC | PRN

## 2014-12-21 MED ORDER — MORPHINE SULFATE ER 30 MG PO TBCR: 30.0000 mg | EXTENDED_RELEASE_TABLET | Freq: Two times a day (BID) | ORAL | Status: DC

## 2014-12-21 MED ORDER — NORTRIPTYLINE HCL 50 MG PO CAPS: 50.0000 mg | ORAL_CAPSULE | Freq: Every day | ORAL | Status: DC

## 2014-12-21 MED ORDER — BACLOFEN 20 MG PO TABS: 20.0000 mg | ORAL_TABLET | Freq: Three times a day (TID) | ORAL | Status: DC

## 2014-12-21 MED ORDER — ALPRAZOLAM 0.25 MG PO TABS: 0.2500 mg | ORAL_TABLET | Freq: Two times a day (BID) | ORAL | Status: DC | PRN

## 2014-12-21 NOTE — Progress Notes (Signed)
Subjective:    Patient ID: Sherry Wong, female    DOB: Feb 02, 1956, 59 y.o.   MRN: [id number]  HPI: [name] is a 59 year old female who returned for follow up for chronic pain and medication refill. She says her pain is located in her lower back radiating into her lower extremities laterally. She rates her pain 4. Her current exercise regime is pool therapy three times a week. She's using the  elliptical for thirty minutes three days a week.  She states she's having increase frequency and intensity of muscle spasms baclofen increased she verbalizes understanding. Also states she lost her balance 4-5 times she was able to brace herself from falling will refer her to neurologist she verbalizes understanding.  Pain Inventory Average Pain 5 Pain Right Now 4 My pain is constant, sharp, burning, stabbing, tingling and aching  In the last 24 hours, has pain interfered with the following? General activity 6 Relation with others 6 Enjoyment of life 6 What TIME of day is your pain at its worst? ALL Sleep (in general) Fair  Pain is worse with: walking, bending, sitting, standing and some activites Pain improves with: rest, heat/ice and medication Relief from Meds: 4  Mobility walk without assistance use a cane how many minutes can you walk? 20 ability to climb steps?  yes do you drive?  yes  Function disabled: date disabled 04/2012  Neuro/Psych bladder control problems bowel control problems weakness numbness tremor tingling trouble walking spasms depression anxiety  Prior Studies Any changes since last visit?  no  Physicians involved in your care Any changes since last visit?  no   Family History  Problem Relation Age of Onset  . Anesthesia problems Sister   . Hypotension Neg Hx   . Malignant hyperthermia Neg Hx   . Pseudochol deficiency Neg Hx   . Anesthesia problems Mother    History   Social History  . Marital Status: Married    Spouse  Name: N/A  . Number of Children: N/A  . Years of Education: N/A   Social History Main Topics  . Smoking status: Never Smoker   . Smokeless tobacco: Never Used  . Alcohol Use: Yes     Comment: occ beer  . Drug Use: No  . Sexual Activity: Not Currently   Other Topics Concern  . None   Social History Narrative   Past Surgical History  Procedure Laterality Date  . Cysts removed from ovary    . Cholecystectomy    . Dilation and curettage of uterus    . Breast surgery      left breast lumpectomy  . Breast biopsy      right  . Colonoscopy    . Esophagogastroduodenoscopy    . Lumbar laminectomy/decompression microdiscectomy  12/03/2011    Procedure: LUMBAR LAMINECTOMY/DECOMPRESSION MICRODISCECTOMY 2 LEVELS;  Surgeon: Floyce Stakes, MD;  Location: [place] [facility];  Service: Neurosurgery;  Laterality: Right;  Right Lumbar four-five Lumbar five sacral one Foraminotomies  . Transforaminal lumbar interbody fusion (tlif) with pedicle screw fixation 2 level N/A 08/25/2013    Procedure:  Lumbar four-five, Lumbar five-Sacral one Transforaminal Lumbar Interbody Fusion ;  Surgeon: Floyce Stakes, MD;  Location: [place] [facility];  Service: Neurosurgery;  Laterality: N/A;  . Hematoma evacuation N/A 08/25/2013    Procedure: EVACUATION OF LUMBAR HEMATOMA;  Surgeon: Floyce Stakes, MD;  Location: [place] [facility];  Service: Neurosurgery;  Laterality: N/A;  . Placement of lumbar drain  N/A 09/01/2013    Procedure: PLACEMENT OF LUMBAR DRAIN;  Surgeon: Floyce Stakes, MD;  Location: [place] [facility];  Service: Neurosurgery;  Laterality: N/A;  . Lumbar wound debridement N/A 09/01/2013    Procedure: LUMBAR WOUND DEBRIDEMENT;  Surgeon: Floyce Stakes, MD;  Location: [facility];  Service: Neurosurgery;  Laterality: N/A;   Past Medical History  Diagnosis Date  . PONV (postoperative nausea and vomiting)   . Hypertension     takes Losartan daily  . Pneumonia     hx of 15+yrs ago  . Chronic back pain      ddd/lumbago,and radiculopathy  . Arthritis   . Gout     hx of  . GERD (gastroesophageal reflux disease)     takes Protonix daily  . H/O hiatal hernia   . Gastric ulcer   . Ulcerative colitis   . IBS (irritable bowel syndrome)   . Hx of colonic polyps   . Urinary incontinence   . Nocturia   . Hypothyroidism     takes Levothyroxine daily  . Depression   . Anxiety     takes celexa daily   BP 134/86 mmHg  Pulse 92  Resp 14  SpO2 98%  Opioid Risk Score:   Fall Risk Score: Moderate Fall Risk (6-13 points)`1  Depression screen PHQ 2/9  No flowsheet data found.   Review of Systems  HENT: Negative.   Eyes: Negative.   Respiratory: Negative.   Cardiovascular: Negative.   Gastrointestinal: Positive for nausea, vomiting, diarrhea and constipation.       Bowel control problems  Endocrine: Negative.   Genitourinary:       Urine retention, bladder control problems  Musculoskeletal: Positive for myalgias, back pain and arthralgias.  Skin: Negative.   Allergic/Immunologic: Negative.   Neurological: Positive for tremors, weakness and numbness.       Tingling, trouble walking, spasms  Hematological: Negative.   Psychiatric/Behavioral: Positive for dysphoric mood. The patient is nervous/anxious.        Objective:   Physical Exam  Constitutional: She is oriented to person, place, and time. She appears well-developed and well-nourished.  HENT:  Head: Normocephalic and atraumatic.  Neck: Normal range of motion. Neck supple.  Cardiovascular: Normal rate and regular rhythm.   Pulmonary/Chest: Effort normal and breath sounds normal.  Musculoskeletal:  Normal Muscle Bulk and Muscle Testing Reveals: Upper Extremities: Full ROM and Muscle Strength on the right 5/5 and left 4/5 Lumbar Paraspinal Tenderness: L-3- L-5 Lower Extremities: Full ROM and Muscle Strength 5/5 Bilateral Lower Extremities Flexion Produces pain into Lumbar Arises from chair with ease/ Using Straight cane for  support  Neurological: She is alert and oriented to person, place, and time.  Skin: Skin is warm and dry.  Psychiatric: She has a normal mood and affect.  Nursing note and vitals reviewed.         Assessment & Plan:  1. Cauda equina syndrome: Incontinence: Wearing Depends:Continue to Monitor  2. Insomnia: Continue with Nortriptyline and Xanax  3. 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  Refilled: Percocet 10/325 mg #120--use 1 tablet every 6 hours as needed and MS Contin 30 mg one tablet every 12 hours as needed #60. 4. Depression: Continue with Celexa. Continue to Monitor  5. Anxiety: Continue with Xanax. Continue to Monitor  6.Neuropathy: Continue Gabapentin 7. Constipation: Senekot in-effective/ RX: Linzess 8. Unsteady Gait: Referral Neurosurgery 20 minutes of face to face patient care time  was spent during this visit. All questions were encouraged and answered.

## 2015-01-24 ENCOUNTER — Encounter: Payer: Self-pay | Admitting: Registered Nurse

## 2015-01-24 ENCOUNTER — Encounter: Payer: Medicare Other | Attending: Physical Medicine and Rehabilitation | Admitting: Registered Nurse

## 2015-01-24 ENCOUNTER — Other Ambulatory Visit: Payer: Self-pay | Admitting: Registered Nurse

## 2015-01-24 VITALS — BP 104/67 | HR 82 | Resp 14

## 2015-01-24 DIAGNOSIS — G894 Chronic pain syndrome: Secondary | ICD-10-CM

## 2015-01-24 DIAGNOSIS — M5136 Other intervertebral disc degeneration, lumbar region: Secondary | ICD-10-CM | POA: Diagnosis not present

## 2015-01-24 DIAGNOSIS — G834 Cauda equina syndrome: Secondary | ICD-10-CM | POA: Diagnosis not present

## 2015-01-24 DIAGNOSIS — G039 Meningitis, unspecified: Secondary | ICD-10-CM

## 2015-01-24 DIAGNOSIS — M961 Postlaminectomy syndrome, not elsewhere classified: Secondary | ICD-10-CM

## 2015-01-24 DIAGNOSIS — R2681 Unsteadiness on feet: Secondary | ICD-10-CM

## 2015-01-24 DIAGNOSIS — M6249 Contracture of muscle, multiple sites: Secondary | ICD-10-CM

## 2015-01-24 DIAGNOSIS — Z5181 Encounter for therapeutic drug level monitoring: Secondary | ICD-10-CM | POA: Diagnosis not present

## 2015-01-24 DIAGNOSIS — Z79899 Other long term (current) drug therapy: Secondary | ICD-10-CM | POA: Diagnosis not present

## 2015-01-24 DIAGNOSIS — M62838 Other muscle spasm: Secondary | ICD-10-CM

## 2015-01-24 MED ORDER — MORPHINE SULFATE ER 30 MG PO TBCR: 30.0000 mg | EXTENDED_RELEASE_TABLET | Freq: Two times a day (BID) | ORAL | Status: DC

## 2015-01-24 MED ORDER — OXYCODONE-ACETAMINOPHEN 10-325 MG PO TABS: 1.0000 | ORAL_TABLET | Freq: Four times a day (QID) | ORAL | Status: DC | PRN

## 2015-01-24 NOTE — Progress Notes (Signed)
Subjective:    Patient ID: Sherry Wong, female    DOB: Jan 06, 1956, 59 y.o.   MRN: [id number]  HPI: Sherry Wong is a 59 year old female who returned for follow up for chronic pain and medication refill. She says her pain is located in her lower back radiating into her lower extremities laterally and bilateral feet. She rates her pain 5. Her current exercise regime is pool therapy three times a week. She's using the elliptical for thirty minutes three days a week.  Sherry Wong says her unsteady gait has improved she doesn't want to go to [facility] to see her neurosurgeon she states the last time she seen her she was told their wasn't anything they could do she prefers to see someone closer. Neurologist referral placed.  Pain Inventory Average Pain 5 Pain Right Now 5 My pain is sharp, burning, stabbing, tingling and aching  In the last 24 hours, has pain interfered with the following? General activity 7 Relation with others 6 Enjoyment of life 7 What TIME of day is your pain at its worst? all Sleep (in general) Fair  Pain is worse with: walking, bending, standing and some activites Pain improves with: rest, pacing activities and medication Relief from Meds: 5  Mobility walk with assistance use a cane use a walker how many minutes can you walk? 20 ability to climb steps?  yes do you drive?  yes Do you have any goals in this area?  yes  Function disabled: date disabled . I need assistance with the following:  meal prep, household duties and shopping Do you have any goals in this area?  yes  Neuro/Psych bladder control problems bowel control problems weakness numbness tremor tingling trouble walking spasms depression anxiety  Prior Studies Any changes since last visit?  no  Physicians involved in your care Any changes since last visit?  no   Family History  Problem Relation Age of Onset  . Anesthesia problems Sister   . Hypotension Neg Hx   .  Malignant hyperthermia Neg Hx   . Pseudochol deficiency Neg Hx   . Anesthesia problems Mother    History   Social History  . Marital Status: Married    Spouse Name: N/A  . Number of Children: N/A  . Years of Education: N/A   Social History Main Topics  . Smoking status: Never Smoker   . Smokeless tobacco: Never Used  . Alcohol Use: Yes     Comment: occ beer  . Drug Use: No  . Sexual Activity: Not Currently   Other Topics Concern  . None   Social History Narrative   Past Surgical History  Procedure Laterality Date  . Cysts removed from ovary    . Cholecystectomy    . Dilation and curettage of uterus    . Breast surgery      left breast lumpectomy  . Breast biopsy      right  . Colonoscopy    . Esophagogastroduodenoscopy    . Lumbar laminectomy/decompression microdiscectomy  12/03/2011    Procedure: LUMBAR LAMINECTOMY/DECOMPRESSION MICRODISCECTOMY 2 LEVELS;  Surgeon: Floyce Stakes, MD;  Location: [place] [facility];  Service: Neurosurgery;  Laterality: Right;  Right Lumbar four-five Lumbar five sacral one Foraminotomies  . Transforaminal lumbar interbody fusion (tlif) with pedicle screw fixation 2 level N/A 08/25/2013    Procedure:  Lumbar four-five, Lumbar five-Sacral one Transforaminal Lumbar Interbody Fusion ;  Surgeon: Floyce Stakes, MD;  Location: [facility];  Service: Neurosurgery;  Laterality: N/A;  . Hematoma evacuation N/A 08/25/2013    Procedure: EVACUATION OF LUMBAR HEMATOMA;  Surgeon: Floyce Stakes, MD;  Location: [place] [facility];  Service: Neurosurgery;  Laterality: N/A;  . Placement of lumbar drain N/A 09/01/2013    Procedure: PLACEMENT OF LUMBAR DRAIN;  Surgeon: Floyce Stakes, MD;  Location: [place] [facility];  Service: Neurosurgery;  Laterality: N/A;  . Lumbar wound debridement N/A 09/01/2013    Procedure: LUMBAR WOUND DEBRIDEMENT;  Surgeon: Floyce Stakes, MD;  Location: [facility];  Service: Neurosurgery;  Laterality: N/A;   Past Medical History    Diagnosis Date  . PONV (postoperative nausea and vomiting)   . Hypertension     takes Losartan daily  . Pneumonia     hx of 15+yrs ago  . Chronic back pain     ddd/lumbago,and radiculopathy  . Arthritis   . Gout     hx of  . GERD (gastroesophageal reflux disease)     takes Protonix daily  . H/O hiatal hernia   . Gastric ulcer   . Ulcerative colitis   . IBS (irritable bowel syndrome)   . Hx of colonic polyps   . Urinary incontinence   . Nocturia   . Hypothyroidism     takes Levothyroxine daily  . Depression   . Anxiety     takes celexa daily   BP 104/67 mmHg  Pulse 82  Resp 14  SpO2 96%  Opioid Risk Score:   Fall Risk Score: Moderate Fall Risk (6-13 points) (patient previously educated)`1  Depression screen PHQ 2/9  No flowsheet data found.   Review of Systems  Gastrointestinal: Positive for nausea, diarrhea and constipation.  Genitourinary:       Rincontinence  Neurological: Positive for weakness and numbness.       Tingling Spasms   Psychiatric/Behavioral: Positive for dysphoric mood. The patient is nervous/anxious.        Objective:   Physical Exam  Constitutional: She is oriented to person, place, and time.  HENT:  Head: Normocephalic and atraumatic.  Neck: Normal range of motion. Neck supple.  Cardiovascular: Normal rate and regular rhythm.   Pulmonary/Chest: Effort normal and breath sounds normal.  Musculoskeletal:  Normal Muscle Bulk and Muscle Testing Reveals: Upper Extremities: Full ROM and Muscle Strength 5/5 Lumbar Paraspinal Tenderness: L-4- L-5 Lower Extremities: Full ROM and Muscle Strength 5/5 Bilateral Lower Extremity Flexion Produces Pain  Posteriorly into lower extremities. Arises from chair with ease/ Using 4 prong cane Narrow Based Gait  Neurological: She is alert and oriented to person, place, and time.  Skin: Skin is warm and dry.  Psychiatric: She has a normal mood and affect.  Nursing note and vitals reviewed.          Assessment & Plan:  1. Cauda equina syndrome: Incontinence: Wearing Depends:Continue to Monitor  2. Insomnia: Continue with Nortriptyline and Xanax  3. 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  Refilled: Percocet 10/325 mg #120--use 1 tablet every 6 hours as needed and MS Contin 30 mg one tablet every 12 hours as needed #60. 4. Depression: Continue with Celexa. Continue to Monitor  5. Anxiety: Continue with Xanax. Continue to Monitor  6.Neuropathy: Continue Gabapentin 7. Constipation: Continue Senokot 8. Unsteady Gait: Referral Neurologist  20 minutes of face to face patient care time was spent during this visit. All questions were encouraged and answered.

## 2015-01-25 LAB — PMP ALCOHOL METABOLITE (ETG): Ethyl Glucuronide (EtG): NEGATIVE ng/mL

## 2015-01-28 LAB — PRESCRIPTION MONITORING PROFILE (SOLSTAS)
Amphetamine/Meth: NEGATIVE ng/mL
Barbiturate Screen, Urine: NEGATIVE ng/mL
Buprenorphine, Urine: NEGATIVE ng/mL
Cannabinoid Scrn, Ur: NEGATIVE ng/mL
Carisoprodol, Urine: NEGATIVE ng/mL
Cocaine Metabolites: NEGATIVE ng/mL
Creatinine, Urine: 62.12 mg/dL (ref >20.0)
Fentanyl, Ur: NEGATIVE ng/mL
MDMA URINE: NEGATIVE ng/mL
Meperidine, Ur: NEGATIVE ng/mL
Methadone Screen, Urine: NEGATIVE ng/mL
Nitrites, Initial: NEGATIVE ug/mL
Propoxyphene: NEGATIVE ng/mL
Tapentadol, urine: NEGATIVE ng/mL
Tramadol Scrn, Ur: NEGATIVE ng/mL
Zolpidem, Urine: NEGATIVE ng/mL
pH, Initial: 5.1 pH (ref 4.5–8.9)

## 2015-01-28 LAB — OPIATES/OPIOIDS (LC/MS-MS)
Codeine Urine: NEGATIVE ng/mL (ref <50)
Hydrocodone: NEGATIVE ng/mL (ref <50)
Hydromorphone: NEGATIVE ng/mL — AB (ref <50)
Morphine Urine: 35556 ng/mL (ref <50)
Norhydrocodone, Ur: NEGATIVE ng/mL (ref <50)
Noroxycodone, Ur: 2098 ng/mL (ref <50)
Oxycodone, ur: 1530 ng/mL (ref <50)
Oxymorphone: 1198 ng/mL (ref <50)

## 2015-01-28 LAB — BENZODIAZEPINES (GC/LC/MS), URINE
Alprazolam metabolite (GC/LC/MS), ur confirm: 85 ng/mL (ref <25)
Clonazepam metabolite (GC/LC/MS), ur confirm: NEGATIVE ng/mL (ref <25)
Flurazepam metabolite (GC/LC/MS), ur confirm: NEGATIVE ng/mL (ref <50)
Lorazepam (GC/LC/MS), ur confirm: NEGATIVE ng/mL (ref <50)
Midazolam (GC/LC/MS), ur confirm: NEGATIVE ng/mL (ref <50)
Nordiazepam (GC/LC/MS), ur confirm: NEGATIVE ng/mL (ref <50)
Oxazepam (GC/LC/MS), ur confirm: NEGATIVE ng/mL (ref <50)
Temazepam (GC/LC/MS), ur confirm: NEGATIVE ng/mL (ref <50)
Triazolam metabolite (GC/LC/MS), ur confirm: NEGATIVE ng/mL (ref <50)

## 2015-01-28 LAB — OXYCODONE, URINE (LC/MS-MS)
Noroxycodone, Ur: 2098 ng/mL (ref <50)
Oxycodone, ur: 1530 ng/mL (ref <50)
Oxymorphone: 1198 ng/mL (ref <50)

## 2015-02-01 ENCOUNTER — Ambulatory Visit (INDEPENDENT_AMBULATORY_CARE_PROVIDER_SITE_OTHER): Payer: Medicare Other | Admitting: Neurology

## 2015-02-01 ENCOUNTER — Other Ambulatory Visit: Payer: Self-pay | Admitting: Registered Nurse

## 2015-02-01 ENCOUNTER — Encounter: Payer: Self-pay | Admitting: Neurology

## 2015-02-01 VITALS — BP 130/88 | HR 89 | Temp 97.6°F | Ht 67.0 in | Wt 236.6 lb

## 2015-02-01 DIAGNOSIS — R251 Tremor, unspecified: Secondary | ICD-10-CM | POA: Diagnosis not present

## 2015-02-01 DIAGNOSIS — R0683 Snoring: Secondary | ICD-10-CM | POA: Diagnosis not present

## 2015-02-01 DIAGNOSIS — R252 Cramp and spasm: Secondary | ICD-10-CM

## 2015-02-01 DIAGNOSIS — G4719 Other hypersomnia: Secondary | ICD-10-CM

## 2015-02-01 DIAGNOSIS — R258 Other abnormal involuntary movements: Secondary | ICD-10-CM | POA: Diagnosis not present

## 2015-02-01 DIAGNOSIS — G4761 Periodic limb movement disorder: Secondary | ICD-10-CM | POA: Diagnosis not present

## 2015-02-01 NOTE — Patient Instructions (Signed)
Overall you are doing fairly well but I do want to suggest a few things today:   Remember to drink plenty of fluid, eat healthy meals and do not skip any meals. Try to eat protein with a every meal and eat a healthy snack such as fruit or nuts in between meals. Try to keep a regular sleep-wake schedule and try to exercise daily, particularly in the form of walking, 20-30 minutes a day, if you can.   As far as diagnostic testing: MRI brain  I would like to see you back in 3 months, sooner if we need to. Please call us with any interim questions, concerns, problems, updates or refill requests.   Please also call us for any test results so we can go over those with you on the phone.  My clinical assistant and will answer any of your questions and relay your messages to me and also relay most of my messages to you.   Our phone number is [phone]. We also have an after hours call service for urgent matters and there is a physician on-call for urgent questions. For any emergencies you know to call 911 or go to the nearest emergency room

## 2015-02-01 NOTE — Progress Notes (Signed)
[facility]    Provider:  Dr Jaynee Eagles Referring Provider: Tawni Carnes, PA-C Primary Care Physician:  Jeri Modena  CC:  Tremor and jerking  HPI:  Sherry Wong is a 59 y.o. female here as a referral from Dr. Saunders Glance for tremor  Patient has a very complicated history. She is a 59 year old female with 4 previous lumbar spine surgeries. In 2014 had L4-S1PSF with TLIF and revision decompression, she was in ICU for 13 days, she was paralyzed from the waist down, had a complicated post-op. She has chronic low back pain down into her buttocks. Her privates are still numb, backs of the legs, feet are numb. She has bowel and bladder issues. She is here for evaluation of new issues, having spasms in the feet and hands and legs that started 3-4 months ago. Worsening. Legs give out, she fell once.  She will be holding a cup and her arm will jerk and the cup will fall. She denies any restless leg feeling, spasms are completely involuntary. Spasms in all the extremities. Like a jerking, happens multiple times a day. Her legs kicks as do her arms and hands. All 4 limbs happen at separate times, sometimes the legs kick together at the same time. She looses feeling in her hands. Feels like muscles in her hands are moving. She doesn't feel well. She is tired and weak. Jerking movement at the knee and at the wrist. The hands just go numb. She has tremors as well, these just started happening with the jerking.  She has been having headaches. No new medications in the last few months. She was started on Baclofen after the jerking movements started. Gabapentin was stopped afterwards. Jerking and tremors getting more frequent. She has movements even when sleeping. More in the legs. She has to leave the bedroom. 10-12x a day. Started in the legs and now the arms are affected. No family tremors. Doesn't drink alcohol so doesn't know if the tremor gets better with alcohol.   Recent labs include  alcohol metabolite negative, prescription monitoring negative  Reviewed notes, labs and imaging from outside physicians, which showed: Reviewed extensive records from [facility]. In 2014 had L4-S1PSF with TLIF and revision decompression, she woke up in recovery dense lower extremity weakness and numbness and was brought back for an exploration. She was found to have a CSF leak and a hematoma in the lumbar drain was placed. She fell and had a seizure and then underwent a third surgery. She presents today with bowel and bladder incontinence dense bilateral foot paresthesias and significant weakness in her leg especially below the knees. She has undergone significant rehabilitation PT and home. They did not think more surgery would improve patient's situation. Her sensory deficits in bowel and bladder incontinence are likely permanent.  X-ray of the lumbar spine showed L4-S1 posterior spinal fusion with posterior decompression and intravertebral body disc spacers at L4-5 and L5-S1 no evidence of hardware failure or complication. Alignment is normal. Intravertebral disc spaces above the fusion are maintained. This was done May 2015  Lumbar myelogram was performed in August 2015 under fluoroscopic guidance. There is an abrupt cutoff at the inferior aspect of the thecal sac with no contrast seen inferior to the L4-L5 disc space. No dynamic vertebral body or disc instability. Nonspecific soft tissue density causing high-grade canal stenosis inferior to the L4-L5 vertebral bodies. This may represent postoperative granulation tissue but is nonspecific in appearance. Mild asymmetric disc bulge at T11-T12 with moderate right neural  foraminal narrowing. Remaining levels demonstrate minimal to no significant degenerative change. Status post posterior fixation and decompression extending from L4-S1 no evidence of orthopedic hardware failure.  MRI of the lumbar spine in September 2015 showed linear faint T2  hypointensity extending transversely anterior to the posterior within the spinal canal on the sagittal images at the level of L4 on L5, possibly representing an adhesion, corresponding location to the level of the distal end of the contrast column on the corresponding CT. Alternatively this could represent the superior aspect of the CSF collection or arachnoid cyst. Nerve roots are located posteriorly within the canal distally beyond the L4-L5 level which can be seen in the setting of arachnoiditis or could be secondary to displacement from a loculated CSF collection. It is a posterior spinal fusion at L4-S1 and decompression. There is a T2 hyperintense collection within the dorsal soft tissue posterior to the level of L5-S1, which could represent postoperative seroma or loculated CSF collection.    Review of Systems: Patient complains of symptoms per HPI as well as the following symptoms: fatigue, numbness, weakness, restless legs, tremor, incontinence, constipation, aching muscles, allergies, weakness, tremor, depression, anxiety, decreased energy, disinterest in activities. . Pertinent negatives per HPI. All others negative.   History   Social History  . Marital Status: Married    Spouse Name: N/A  . Number of Children: N/A  . Years of Education: N/A   Occupational History  . Not on file.   Social History Main Topics  . Smoking status: Never Smoker   . Smokeless tobacco: Never Used  . Alcohol Use: Yes     Comment: occ beer  . Drug Use: No  . Sexual Activity: Not Currently   Other Topics Concern  . Not on file   Social History Narrative    Family History  Problem Relation Age of Onset  . Anesthesia problems Sister   . Hypotension Neg Hx   . Malignant hyperthermia Neg Hx   . Pseudochol deficiency Neg Hx   . Anesthesia problems Mother     Past Medical History  Diagnosis Date  . PONV (postoperative nausea and vomiting)   . Hypertension     takes Losartan daily  .  Pneumonia     hx of 15+yrs ago  . Chronic back pain     ddd/lumbago,and radiculopathy  . Arthritis   . Gout     hx of  . GERD (gastroesophageal reflux disease)     takes Protonix daily  . H/O hiatal hernia   . Gastric ulcer   . Ulcerative colitis   . IBS (irritable bowel syndrome)   . Hx of colonic polyps   . Urinary incontinence   . Nocturia   . Hypothyroidism     takes Levothyroxine daily  . Depression   . Anxiety     takes celexa daily    Past Surgical History  Procedure Laterality Date  . Cysts removed from ovary    . Cholecystectomy    . Dilation and curettage of uterus    . Breast surgery      left breast lumpectomy  . Breast biopsy      right  . Colonoscopy    . Esophagogastroduodenoscopy    . Lumbar laminectomy/decompression microdiscectomy  12/03/2011    Procedure: LUMBAR LAMINECTOMY/DECOMPRESSION MICRODISCECTOMY 2 LEVELS;  Surgeon: Floyce Stakes, MD;  Location: [facility];  Service: Neurosurgery;  Laterality: Right;  Right Lumbar four-five Lumbar five sacral one Foraminotomies  . Transforaminal lumbar  interbody fusion (tlif) with pedicle screw fixation 2 level N/A 08/25/2013    Procedure:  Lumbar four-five, Lumbar five-Sacral one Transforaminal Lumbar Interbody Fusion ;  Surgeon: Floyce Stakes, MD;  Location: [place] [facility];  Service: Neurosurgery;  Laterality: N/A;  . Hematoma evacuation N/A 08/25/2013    Procedure: EVACUATION OF LUMBAR HEMATOMA;  Surgeon: Floyce Stakes, MD;  Location: [place] [facility];  Service: Neurosurgery;  Laterality: N/A;  . Placement of lumbar drain N/A 09/01/2013    Procedure: PLACEMENT OF LUMBAR DRAIN;  Surgeon: Floyce Stakes, MD;  Location: [place];  Service: Neurosurgery;  Laterality: N/A;  . Lumbar wound debridement N/A 09/01/2013    Procedure: LUMBAR WOUND DEBRIDEMENT;  Surgeon: Floyce Stakes, MD;  Location: [facility];  Service: Neurosurgery;  Laterality: N/A;    Current Outpatient Prescriptions  Medication Sig  Dispense Refill  . albuterol (PROVENTIL HFA;VENTOLIN HFA) 108 (90 BASE) MCG/ACT inhaler Inhale 2 puffs into the lungs 2 (two) times daily.    [name] ALPRAZolam (XANAX) 0.25 MG tablet Take 1 tablet (0.25 mg total) by mouth 2 (two) times daily as needed for anxiety. 50 tablet 2  . baclofen (LIORESAL) 20 MG tablet TAKE 1 TABLET BY MOUTH 3 TIMES DAILY. 90 tablet 2  . citalopram (CELEXA) 20 MG tablet Take 1.5 tablet every morning 45 tablet 2  . furosemide (LASIX) 20 MG tablet Take 20 mg by mouth daily.    . hydrochlorothiazide (HYDRODIURIL) 25 MG tablet     . levothyroxine (SYNTHROID, LEVOTHROID) 175 MCG tablet Take 175 mcg by mouth daily.    [name] morphine (MS CONTIN) 30 MG 12 hr tablet Take 1 tablet (30 mg total) by mouth every 12 (twelve) hours. 60 tablet 0  . nortriptyline (PAMELOR) 50 MG capsule Take 1 capsule (50 mg total) by mouth at bedtime. 30 capsule 3  . oxyCODONE-acetaminophen (PERCOCET) 10-325 MG per tablet Take 1 tablet by mouth every 6 (six) hours as needed for pain (for severe pain). May take an extra tablet on days when pain is sever. No more than 4 a day 120 tablet 0  . pantoprazole (PROTONIX) 40 MG tablet Take 40 mg by mouth daily.    [name] senna-docusate (SENOKOT-S) 8.6-50 MG per tablet Take 2 tablets by mouth at bedtime. Bowel program--over the counter    . gabapentin (NEURONTIN) 600 MG tablet Take 1 tablet (600 mg total) by mouth 3 (three) times daily. (Patient not taking: Reported on 02/01/2015) 90 tablet 3   No current facility-administered medications for this visit.    Allergies as of 02/01/2015 - Review Complete 02/01/2015  Allergen Reaction Noted  . Codeine Nausea Only 06/21/2011    Vitals: BP 130/88 mmHg  Pulse 89  Temp(Src) 97.6 F (36.4 C)  Ht 5\' 7"  (1.702 m)  Wt 236 lb 9.6 oz (107.321 kg)  BMI 37.05 kg/m2 Last Weight:  Wt Readings from Last 1 Encounters:  02/01/15 236 lb 9.6 oz (107.321 kg)   Last Height:   Ht Readings from Last 1 Encounters:  02/01/15 5\' 7"  (1.702 m)    Physical exam: Exam: Gen: NAD, conversant, well nourised, obese, well groomed                     CV: RRR, no MRG. No Carotid Bruits. No peripheral edema, warm, nontender Eyes: Conjunctivae clear without exudates or hemorrhage  Neuro: Detailed Neurologic Exam  Speech:    Speech is normal; fluent and spontaneous with normal comprehension.  Cognition:  The patient is oriented to person, place, and time;     recent and remote memory intact;     language fluent;     normal attention, concentration,     fund of knowledge Cranial Nerves:    The pupils are equal, round, and reactive to light. The fundi are normal and spontaneous venous pulsations are present. Visual fields are full to finger confrontation. Extraocular movements are intact. Trigeminal sensation is intact and the muscles of mastication are normal. The face is symmetric. The palate elevates in the midline. Hearing intact. Voice is normal. Shoulder shrug is normal. The tongue has normal motion without fasciculations.   Coordination:    Can  Perform FTN successfully. Cannot perform HTS due to weakness and body habitus. See motor observation below.    Gait:    Antalgic, wide based with a cane  Motor Observation:    Tremor is not consistent. Initially tremor on FTN only when approaching target and then at target but when repeated tremor is throughout [place] action. There is a head tremor wiith FTN but not at rest or at any other time. Postural tremor of all the limbs that resolves if patient is distracted There is no tremor when patient is asked to take something from my hand and hold it.  No tremor in legs or arm when asked to use the other hand in an open and close action while performing FTN with other arm.   Tone:    Not increased  Posture:    Posture is stooped    Strength: difficult motor exam due to pain however appears intact in uppers, lowers at least 4/5.     Sensation: decreased to LT in lower extremiites       Reflex Exam:  DTR's:    Deep tendon reflexes in the upper extremites symmetric. Hyporeflexic in the lowers. Toes:    The toes are downgoing bilaterally.   Clonus:    Clonus is absent.       Assessment/Plan:  Patient has a very complicated history. She is a lovely 59 year old female with 4 previous lumbar spine surgeries. In 2014 had L4-S1PSF with TLIF and revision decompression. Complicated postop course. Patient is here today for evaluation of new tremor and jerking movements of the limbs. Did not witness any jerking movements today but was able to examine new tremor. Tremor is not consistent and it is distractible. Patient had  Tremor of variable quality with finger-to-nose but there is no tremor if she is asked to to reach and grab something from examiner and hold it. The tremor also resolves if asked to perform other complicated motor movements. Unsure if this is neurologic in etiology.  Will order and MRI of the brain to evaluate for intracranial etiology Will order EEG for the jerking movements (patient reportedly had a seizure while inpatient) Patient says she has jerks all night, snores, is excessively tired and sleeps during the day. Will request a sleep study for OSA and PLMS. CMP to evaluate electrolytes   Sarina Ill, MD  [facility] [address] [place], [place] [address]  Phone [phone] Fax [phone]

## 2015-02-02 ENCOUNTER — Telehealth: Payer: Self-pay | Admitting: Neurology

## 2015-02-02 NOTE — Telephone Encounter (Signed)
Called pt and had to leave a VM for pt to call back to schedule her EEG.  Also advised that she would need labs either before or after the EEG.

## 2015-02-03 ENCOUNTER — Other Ambulatory Visit: Payer: Medicare Other

## 2015-02-07 DIAGNOSIS — R251 Tremor, unspecified: Secondary | ICD-10-CM | POA: Diagnosis not present

## 2015-02-08 ENCOUNTER — Other Ambulatory Visit: Payer: Self-pay | Admitting: Diagnostic Neuroimaging

## 2015-02-08 DIAGNOSIS — R251 Tremor, unspecified: Secondary | ICD-10-CM

## 2015-02-09 ENCOUNTER — Telehealth: Payer: Self-pay

## 2015-02-09 DIAGNOSIS — E876 Hypokalemia: Secondary | ICD-10-CM | POA: Diagnosis not present

## 2015-02-09 DIAGNOSIS — E039 Hypothyroidism, unspecified: Secondary | ICD-10-CM | POA: Diagnosis not present

## 2015-02-09 DIAGNOSIS — I10 Essential (primary) hypertension: Secondary | ICD-10-CM | POA: Diagnosis not present

## 2015-02-09 DIAGNOSIS — K219 Gastro-esophageal reflux disease without esophagitis: Secondary | ICD-10-CM | POA: Diagnosis not present

## 2015-02-09 NOTE — Telephone Encounter (Signed)
Patient was informed that her MRI results were normal, she has not questions at this time.  Thanks

## 2015-02-10 ENCOUNTER — Ambulatory Visit (INDEPENDENT_AMBULATORY_CARE_PROVIDER_SITE_OTHER): Payer: Medicare Other | Admitting: Neurology

## 2015-02-10 ENCOUNTER — Other Ambulatory Visit (INDEPENDENT_AMBULATORY_CARE_PROVIDER_SITE_OTHER): Payer: Self-pay

## 2015-02-10 DIAGNOSIS — R569 Unspecified convulsions: Secondary | ICD-10-CM | POA: Diagnosis not present

## 2015-02-10 DIAGNOSIS — Z0289 Encounter for other administrative examinations: Secondary | ICD-10-CM

## 2015-02-10 DIAGNOSIS — G4719 Other hypersomnia: Secondary | ICD-10-CM | POA: Diagnosis not present

## 2015-02-10 DIAGNOSIS — R251 Tremor, unspecified: Secondary | ICD-10-CM | POA: Diagnosis not present

## 2015-02-10 DIAGNOSIS — R252 Cramp and spasm: Secondary | ICD-10-CM

## 2015-02-10 NOTE — Procedures (Signed)
    History:  Sherry Wong is a 59 year old patient with a history of onset of jerking that began 3-4 months prior to this evaluation. The patient may fall with the episodes. The patient is being evaluated for the above events.  This is a routine EEG. No skull defects are noted. Medications include help Xanax, albuterol, baclofen, Celexa, Lasix, gabapentin, hydrochlorothiazide, Synthroid, morphine, nortriptyline, oxycodone, Protonix, and Senokot.   EEG classification: Normal awake and drowsy  Description of the recording: The background rhythms of this recording consists of a fairly well modulated medium amplitude alpha rhythm of 8 Hz that is reactive to eye opening and closure. As the record progresses, the patient appears to remain in the waking state throughout the recording. Photic stimulation was performed, resulting in a bilateral and symmetric photic driving response. Hyperventilation was also performed, resulting in a minimal buildup of the background rhythm activities without significant slowing seen. Toward the end of the recording, the patient enters the drowsy state with slight symmetric slowing seen. The patient never enters stage II sleep. At no time during the recording does there appear to be evidence of spike or spike wave discharges or evidence of focal slowing. EKG monitor shows no evidence of cardiac rhythm abnormalities with a heart rate of 78 .  Impression: This is a normal EEG recording in the waking and drowsy state. No evidence of ictal or interictal discharges are seen.

## 2015-02-11 LAB — COMPREHENSIVE METABOLIC PANEL
ALT: 17 IU/L (ref 0–32)
AST: 25 IU/L (ref 0–40)
Albumin/Globulin Ratio: 1.4 (ref 1.1–2.5)
Albumin: 4.1 g/dL (ref 3.5–5.5)
Alkaline Phosphatase: 144 IU/L — ABNORMAL HIGH (ref 39–117)
BUN/Creatinine Ratio: 13 (ref 9–23)
BUN: 10 mg/dL (ref 6–24)
Bilirubin Total: 0.6 mg/dL (ref 0.0–1.2)
CO2: 31 mmol/L — ABNORMAL HIGH (ref 18–29)
Calcium: 9 mg/dL (ref 8.7–10.2)
Chloride: 96 mmol/L — ABNORMAL LOW (ref 97–108)
Creatinine, Ser: 0.75 mg/dL (ref 0.57–1.00)
GFR calc Af Amer: 101 mL/min/{1.73_m2} (ref >59)
GFR calc non Af Amer: 88 mL/min/{1.73_m2} (ref >59)
Globulin, Total: 3 g/dL (ref 1.5–4.5)
Glucose: 107 mg/dL — ABNORMAL HIGH (ref 65–99)
Potassium: 3.2 mmol/L — ABNORMAL LOW (ref 3.5–5.2)
Sodium: 143 mmol/L (ref 134–144)
Total Protein: 7.1 g/dL (ref 6.0–8.5)

## 2015-02-13 ENCOUNTER — Telehealth: Payer: Self-pay

## 2015-02-13 NOTE — Telephone Encounter (Signed)
VM left to inform pt EEG was normal, instructed to call office if she has any questions.  Thanks

## 2015-02-14 ENCOUNTER — Telehealth: Payer: Self-pay

## 2015-02-14 NOTE — Telephone Encounter (Signed)
VM was left to inform pt of normal EEG, also her potassium was a little low and phosphatase alkaline was a little elevated.  Patient told to call office back with any questions.  Thanks

## 2015-02-15 NOTE — Progress Notes (Signed)
Urine drug screen for this encounter is consistent for prescribed medication 

## 2015-02-16 DIAGNOSIS — E039 Hypothyroidism, unspecified: Secondary | ICD-10-CM | POA: Diagnosis not present

## 2015-02-16 DIAGNOSIS — M545 Low back pain: Secondary | ICD-10-CM | POA: Diagnosis not present

## 2015-02-16 DIAGNOSIS — F329 Major depressive disorder, single episode, unspecified: Secondary | ICD-10-CM | POA: Diagnosis not present

## 2015-02-16 DIAGNOSIS — I10 Essential (primary) hypertension: Secondary | ICD-10-CM | POA: Diagnosis not present

## 2015-02-16 DIAGNOSIS — M4800 Spinal stenosis, site unspecified: Secondary | ICD-10-CM | POA: Diagnosis not present

## 2015-02-20 ENCOUNTER — Institutional Professional Consult (permissible substitution): Payer: Medicare Other | Admitting: Neurology

## 2015-02-20 ENCOUNTER — Encounter: Payer: Medicare Other | Attending: Physical Medicine and Rehabilitation | Admitting: Physical Medicine & Rehabilitation

## 2015-02-20 ENCOUNTER — Encounter: Payer: Self-pay | Admitting: Physical Medicine & Rehabilitation

## 2015-02-20 VITALS — BP 132/86 | HR 85 | Resp 14

## 2015-02-20 DIAGNOSIS — M5136 Other intervertebral disc degeneration, lumbar region: Secondary | ICD-10-CM | POA: Insufficient documentation

## 2015-02-20 DIAGNOSIS — Z5181 Encounter for therapeutic drug level monitoring: Secondary | ICD-10-CM | POA: Insufficient documentation

## 2015-02-20 DIAGNOSIS — Z79899 Other long term (current) drug therapy: Secondary | ICD-10-CM | POA: Insufficient documentation

## 2015-02-20 DIAGNOSIS — G253 Myoclonus: Secondary | ICD-10-CM | POA: Diagnosis not present

## 2015-02-20 DIAGNOSIS — M51369 Other intervertebral disc degeneration, lumbar region without mention of lumbar back pain or lower extremity pain: Secondary | ICD-10-CM

## 2015-02-20 DIAGNOSIS — G834 Cauda equina syndrome: Secondary | ICD-10-CM

## 2015-02-20 DIAGNOSIS — G039 Meningitis, unspecified: Secondary | ICD-10-CM | POA: Diagnosis not present

## 2015-02-20 MED ORDER — OXYCODONE-ACETAMINOPHEN 10-325 MG PO TABS: 1.0000 | ORAL_TABLET | Freq: Four times a day (QID) | ORAL | Status: DC | PRN

## 2015-02-20 MED ORDER — MORPHINE SULFATE ER 30 MG PO TBCR: 30.0000 mg | EXTENDED_RELEASE_TABLET | Freq: Two times a day (BID) | ORAL | Status: DC

## 2015-02-20 NOTE — Progress Notes (Deleted)
Subjective:    Patient ID: Sherry Wong, female    DOB: 10-03-1955, 59 y.o.   MRN: [id number]  HPI  Pain Inventory Average Pain 6 Pain Right Now 5 My pain is sharp, burning and stabbing  In the last 24 hours, has pain interfered with the following? General activity 6 Relation with others 6 Enjoyment of life 7 What TIME of day is your pain at its worst? daytime evening and night Sleep (in general) Fair  Pain is worse with: walking, bending, standing and some activites Pain improves with: rest and medication Relief from Meds: 5  Mobility walk with assistance use a cane use a walker how many minutes can you walk? 10-20 ability to climb steps?  yes do you drive?  yes  Function disabled: date disabled 04/30/2012 I need assistance with the following:  meal prep, household duties and shopping  Neuro/Psych bladder control problems bowel control problems weakness numbness tremor trouble walking spasms depression anxiety  Prior Studies Any changes since last visit?  no  Physicians involved in your care Any changes since last visit?  no   Family History  Problem Relation Age of Onset  . Anesthesia problems Sister   . Hypotension Neg Hx   . Malignant hyperthermia Neg Hx   . Pseudochol deficiency Neg Hx   . Anesthesia problems Mother    History   Social History  . Marital Status: Married    Spouse Name: N/A  . Number of Children: 0  . Years of Education: 12   Occupational History  . Disabled    Social History Main Topics  . Smoking status: Never Smoker   . Smokeless tobacco: Never Used  . Alcohol Use: 0.0 oz/week    0 Standard drinks or equivalent per week     Comment: occ- 0.5 can beer every 2 months  . Drug Use: No  . Sexual Activity: Not Currently   Other Topics Concern  . None   Social History Narrative   Lives at home with husband.   Caffeine use: 1 cup coffee 3 times per week.    Past Surgical History  Procedure Laterality Date  .  Cysts removed from ovary    . Cholecystectomy    . Dilation and curettage of uterus    . Breast surgery      left breast lumpectomy  . Breast biopsy      right  . Colonoscopy    . Esophagogastroduodenoscopy    . Lumbar laminectomy/decompression microdiscectomy  12/03/2011    Procedure: LUMBAR LAMINECTOMY/DECOMPRESSION MICRODISCECTOMY 2 LEVELS;  Surgeon: Floyce Stakes, MD;  Location: [place] [facility];  Service: Neurosurgery;  Laterality: Right;  Right Lumbar four-five Lumbar five sacral one Foraminotomies  . Transforaminal lumbar interbody fusion (tlif) with pedicle screw fixation 2 level N/A 08/25/2013    Procedure:  Lumbar four-five, Lumbar five-Sacral one Transforaminal Lumbar Interbody Fusion ;  Surgeon: Floyce Stakes, MD;  Location: [facility];  Service: Neurosurgery;  Laterality: N/A;  . Hematoma evacuation N/A 08/25/2013    Procedure: EVACUATION OF LUMBAR HEMATOMA;  Surgeon: Floyce Stakes, MD;  Location: [place] [facility];  Service: Neurosurgery;  Laterality: N/A;  . Placement of lumbar drain N/A 09/01/2013    Procedure: PLACEMENT OF LUMBAR DRAIN;  Surgeon: Floyce Stakes, MD;  Location: [place] [place] [facility];  Service: Neurosurgery;  Laterality: N/A;  . Lumbar wound debridement N/A 09/01/2013    Procedure: LUMBAR WOUND DEBRIDEMENT;  Surgeon: Floyce Stakes, MD;  Location: [facility];  Service: Neurosurgery;  Laterality: N/A;   Past Medical History  Diagnosis Date  . PONV (postoperative nausea and vomiting)   . Hypertension     takes Losartan daily  . Pneumonia     hx of 15+yrs ago  . Chronic back pain     ddd/lumbago,and radiculopathy  . Arthritis   . Gout     hx of  . GERD (gastroesophageal reflux disease)     takes Protonix daily  . H/O hiatal hernia   . Gastric ulcer   . Ulcerative colitis   . IBS (irritable bowel syndrome)   . Hx of colonic polyps   . Urinary incontinence   . Nocturia   . Hypothyroidism     takes Levothyroxine daily  . Depression   . Anxiety      takes celexa daily  . Asthma   . Bronchitis    BP 132/86 mmHg  Pulse 85  Resp 14  SpO2 98%  Opioid Risk Score:   Fall Risk Score: Moderate Fall Risk (6-13 points) (previously educated and given handout)`1  Depression screen PHQ 2/9  No flowsheet data found.   Review of Systems  Gastrointestinal: Positive for nausea, diarrhea and constipation.       Bowel Control Problems  Genitourinary: Positive for difficulty urinating.       Bladder control problems  Musculoskeletal: Positive for gait problem.       Spasms  Neurological: Positive for tremors, weakness and numbness.       Tingling  Psychiatric/Behavioral: Positive for dysphoric mood. The patient is nervous/anxious.   All other systems reviewed and are negative.      Objective:   Physical Exam        Assessment & Plan:

## 2015-02-20 NOTE — Patient Instructions (Addendum)
PLEASE CALL ME WITH ANY PROBLEMS OR QUESTIONS (#[phone]).     GABAPENTIN: DAY 1-5 DECREASE TO 300-600-600 DAY 6-10 DECREASE TO 300-300-600 DAY 11+ DECREASE TO 300MG  3X DAILY.  WE'LL SEE HOW YOU YOU'RE DOING AFTER THE DECREASE.

## 2015-02-20 NOTE — Progress Notes (Signed)
Subjective:    Patient ID: Sherry Wong, female    DOB: 1956-09-22, 59 y.o.   MRN: [id number]  HPI   Cortina is here in follow up of her cauda equina syndrome with arachnoiditis. She still feels that she's having spasms and jerking in her upper and lower extremities. It has been happening for about 3 months now. She saw neurology which ruled out central origin and seizure. The symptoms can happen for a few minutes at a time just about any time of the day. They may be more regular at night.   She tries to exercise daily with her husband. She uses a cane for balance and a walker for longer dx.     Pain Inventory Average Pain 6 Pain Right Now 5 My pain is sharp, burning, stabbing, tingling and aching  In the last 24 hours, has pain interfered with the following? General activity 6 Relation with others 6 Enjoyment of life 7 What TIME of day is your pain at its worst? daytime evening and night Sleep (in general) Fair  Pain is worse with: walking, bending, standing and some activites Pain improves with: rest and medication Relief from Meds: 5  Mobility walk with assistance use a cane use a walker how many minutes can you walk? 10-20 ability to climb steps?  yes do you drive?  yes  Function disabled: date disabled 04/30/2012 I need assistance with the following:  meal prep, household duties and shopping  Neuro/Psych bladder control problems bowel control problems weakness numbness tremor trouble walking spasms depression anxiety  Prior Studies Any changes since last visit?  no  Physicians involved in your care Any changes since last visit?  no   Family History  Problem Relation Age of Onset  . Anesthesia problems Sister   . Hypotension Neg Hx   . Malignant hyperthermia Neg Hx   . Pseudochol deficiency Neg Hx   . Anesthesia problems Mother    History   Social History  . Marital Status: Married    Spouse Name: N/A  . Number of Children: 0  . Years of  Education: 12   Occupational History  . Disabled    Social History Main Topics  . Smoking status: Never Smoker   . Smokeless tobacco: Never Used  . Alcohol Use: 0.0 oz/week    0 Standard drinks or equivalent per week     Comment: occ- 0.5 can beer every 2 months  . Drug Use: No  . Sexual Activity: Not Currently   Other Topics Concern  . None   Social History Narrative   Lives at home with husband.   Caffeine use: 1 cup coffee 3 times per week.    Past Surgical History  Procedure Laterality Date  . Cysts removed from ovary    . Cholecystectomy    . Dilation and curettage of uterus    . Breast surgery      left breast lumpectomy  . Breast biopsy      right  . Colonoscopy    . Esophagogastroduodenoscopy    . Lumbar laminectomy/decompression microdiscectomy  12/03/2011    Procedure: LUMBAR LAMINECTOMY/DECOMPRESSION MICRODISCECTOMY 2 LEVELS;  Surgeon: Floyce Stakes, MD;  Location: [place] [facility];  Service: Neurosurgery;  Laterality: Right;  Right Lumbar four-five Lumbar five sacral one Foraminotomies  . Transforaminal lumbar interbody fusion (tlif) with pedicle screw fixation 2 level N/A 08/25/2013    Procedure:  Lumbar four-five, Lumbar five-Sacral one Transforaminal Lumbar Interbody Fusion ;  Surgeon: Stann Mainland  Andres Shad, MD;  Location: [place] [facility];  Service: Neurosurgery;  Laterality: N/A;  . Hematoma evacuation N/A 08/25/2013    Procedure: EVACUATION OF LUMBAR HEMATOMA;  Surgeon: Floyce Stakes, MD;  Location: [place] [facility];  Service: Neurosurgery;  Laterality: N/A;  . Placement of lumbar drain N/A 09/01/2013    Procedure: PLACEMENT OF LUMBAR DRAIN;  Surgeon: Floyce Stakes, MD;  Location: [place] [facility];  Service: Neurosurgery;  Laterality: N/A;  . Lumbar wound debridement N/A 09/01/2013    Procedure: LUMBAR WOUND DEBRIDEMENT;  Surgeon: Floyce Stakes, MD;  Location: [facility];  Service: Neurosurgery;  Laterality: N/A;   Past Medical History  Diagnosis Date  . PONV  (postoperative nausea and vomiting)   . Hypertension     takes Losartan daily  . Pneumonia     hx of 15+yrs ago  . Chronic back pain     ddd/lumbago,and radiculopathy  . Arthritis   . Gout     hx of  . GERD (gastroesophageal reflux disease)     takes Protonix daily  . H/O hiatal hernia   . Gastric ulcer   . Ulcerative colitis   . IBS (irritable bowel syndrome)   . Hx of colonic polyps   . Urinary incontinence   . Nocturia   . Hypothyroidism     takes Levothyroxine daily  . Depression   . Anxiety     takes celexa daily  . Asthma   . Bronchitis    BP 132/86 mmHg  Pulse 85  Resp 14  SpO2 98%  Opioid Risk Score:   Fall Risk Score: Moderate Fall Risk (6-13 points) (previously educated and given handout)`1  Depression screen PHQ 2/9  Depression screen PHQ 2/9 02/20/2015  Decreased Interest 2  Down, Depressed, Hopeless 3  PHQ - 2 Score 5  Altered sleeping 2  Tired, decreased energy 2  Change in appetite 0  Feeling bad or failure about yourself  0  Trouble concentrating 0  Moving slowly or fidgety/restless 0  Suicidal thoughts 0  PHQ-9 Score 9     Review of Systems  Gastrointestinal: Positive for diarrhea and constipation.  Genitourinary: Positive for difficulty urinating.  Musculoskeletal: Positive for gait problem.       Spasms  Neurological: Positive for tremors, weakness and numbness.       Tingling  Psychiatric/Behavioral: Positive for dysphoric mood. The patient is nervous/anxious.   All other systems reviewed and are negative.      Objective:   Physical Exam  Constitutional: She is oriented to person, place, and time. She appears well-developed and well-nourished.  HENT:  Head: Normocephalic and atraumatic.  Neck: Normal range of motion. Neck supple.  Cardiovascular: Normal rate and regular rhythm.  Pulmonary/Chest: Effort normal and breath sounds normal.  Musculoskeletal:  Normal Muscle Bulk and Muscle testing reveals:  Lower Extremities:  Full ROM and Muscle Strength 5/5 with pain inhibition noted. Arises from chair with extra time. Antalgic Gait on right more so today---uses cane to unload.. Sensation diminished below the waist except for medial thigh. DTR's 1+. No resting tone or spasms.  Neurological: She is alert and oriented to person, place, and time.  Skin: Skin is warm and dry.  Psychiatric: mood pleasant, overall improved today..    Assessment & Plan:   1. Cauda equina syndrome with arachnoiditis: pt with substantial pain and ongoing sensory loss.  2. Insomnia: Continue with Trazodone and Xanax  3. 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  -refilled: Percocet 10/325 mg #100--1 q6 prn.  -continue ms contin to 30mg  q12 #60  4. Depression: Continue with Celexa. Continue to Monitor  5. Anxiety: Continue with Xanax 6. Neuropathic pain:  gabapentin    -nortriptyline at 50mg   HEP, [employer], pool, etc  7. ?myoclonus:  Can't help but to think this medication induced especially considering the upper and lower ext symptoms and negative neurologic work up.  -reduce gabapentin to 300mg  TID after a taper.   -consider reducing nortriptyline also pending response to the gabapentin reduction -30 minutes of face to face patient care time was spent during this visit. All questions were encouraged and answered.  Consider caudal block to decrease pain symptoms. Brettney would like to think about it first as we explore the above options.

## 2015-03-21 ENCOUNTER — Encounter: Payer: Self-pay | Admitting: Registered Nurse

## 2015-03-21 ENCOUNTER — Encounter: Payer: Medicare Other | Attending: Physical Medicine and Rehabilitation | Admitting: Registered Nurse

## 2015-03-21 VITALS — BP 145/78 | HR 82 | Resp 16

## 2015-03-21 DIAGNOSIS — M5136 Other intervertebral disc degeneration, lumbar region: Secondary | ICD-10-CM | POA: Insufficient documentation

## 2015-03-21 DIAGNOSIS — Z79899 Other long term (current) drug therapy: Secondary | ICD-10-CM | POA: Diagnosis not present

## 2015-03-21 DIAGNOSIS — G039 Meningitis, unspecified: Secondary | ICD-10-CM | POA: Diagnosis not present

## 2015-03-21 DIAGNOSIS — G894 Chronic pain syndrome: Secondary | ICD-10-CM

## 2015-03-21 DIAGNOSIS — Z5181 Encounter for therapeutic drug level monitoring: Secondary | ICD-10-CM | POA: Insufficient documentation

## 2015-03-21 DIAGNOSIS — M62838 Other muscle spasm: Secondary | ICD-10-CM

## 2015-03-21 DIAGNOSIS — G834 Cauda equina syndrome: Secondary | ICD-10-CM | POA: Diagnosis not present

## 2015-03-21 DIAGNOSIS — G253 Myoclonus: Secondary | ICD-10-CM

## 2015-03-21 DIAGNOSIS — M961 Postlaminectomy syndrome, not elsewhere classified: Secondary | ICD-10-CM

## 2015-03-21 DIAGNOSIS — M6249 Contracture of muscle, multiple sites: Secondary | ICD-10-CM | POA: Diagnosis not present

## 2015-03-21 MED ORDER — BACLOFEN 20 MG PO TABS: ORAL_TABLET | ORAL | Status: DC

## 2015-03-21 MED ORDER — NORTRIPTYLINE HCL 50 MG PO CAPS: 50.0000 mg | ORAL_CAPSULE | Freq: Every day | ORAL | Status: DC

## 2015-03-21 MED ORDER — OXYCODONE-ACETAMINOPHEN 10-325 MG PO TABS: 1.0000 | ORAL_TABLET | Freq: Four times a day (QID) | ORAL | Status: DC | PRN

## 2015-03-21 MED ORDER — ALPRAZOLAM 0.25 MG PO TABS: 0.2500 mg | ORAL_TABLET | Freq: Two times a day (BID) | ORAL | Status: DC | PRN

## 2015-03-21 MED ORDER — GABAPENTIN 300 MG PO CAPS: 300.0000 mg | ORAL_CAPSULE | Freq: Three times a day (TID) | ORAL | Status: DC

## 2015-03-21 MED ORDER — MORPHINE SULFATE ER 30 MG PO TBCR: 30.0000 mg | EXTENDED_RELEASE_TABLET | Freq: Two times a day (BID) | ORAL | Status: DC

## 2015-03-21 NOTE — Progress Notes (Signed)
Subjective:    Patient ID: Sherry Wong, female    DOB: 11-May-1956, 59 y.o.   MRN: [id number]  HPI: [name] is a 59 year old female who returns for follow up for chronic pain and medication refill. She says her pain is located in her lower back radiating into her lower extremities laterally and bilateral feet. She rates her pain 5. Her current exercise regime is pool therapy three times a week. Also complaining of tremors Dr. Ames Dura weaned her gabapentin last month to 300 mg TID. Spoke with Dr. Naaman Plummer we will weaned her off the gabapentin. She was instructed to use Gabapentin 300 mg BID for a week then HS for 4 days and discontinued, she verbalizes understanding. Instructed to call office to evaluate the above she verbalizes understanding. Also following neurologist and has a follow up appointment in August with Dr. Yolanda Bonine.  Pain Inventory Average Pain 6 Pain Right Now 5 My pain is sharp, burning, stabbing, tingling and aching  In the last 24 hours, has pain interfered with the following? General activity 6 Relation with others 6 Enjoyment of life 7 What TIME of day is your pain at its worst? all Sleep (in general) Fair  Pain is worse with: walking, bending, sitting, inactivity, standing and some activites Pain improves with: rest, heat/ice and medication Relief from Meds: 4  Mobility walk with assistance use a cane use a walker how many minutes can you walk? 10 ability to climb steps?  yes do you drive?  yes  Function disabled: date disabled 04/30/2012 I need assistance with the following:  meal prep, household duties and shopping  Neuro/Psych bladder control problems bowel control problems weakness numbness tremor tingling trouble walking spasms depression anxiety  Prior Studies Any changes since last visit?  no  Physicians involved in your care Any changes since last visit?  no   Family History  Problem Relation Age of Onset  . Anesthesia  problems Sister   . Hypotension Neg Hx   . Malignant hyperthermia Neg Hx   . Pseudochol deficiency Neg Hx   . Anesthesia problems Mother    History   Social History  . Marital Status: Married    Spouse Name: N/A  . Number of Children: 0  . Years of Education: 12   Occupational History  . Disabled    Social History Main Topics  . Smoking status: Never Smoker   . Smokeless tobacco: Never Used  . Alcohol Use: 0.0 oz/week    0 Standard drinks or equivalent per week     Comment: occ- 0.5 can beer every 2 months  . Drug Use: No  . Sexual Activity: Not Currently   Other Topics Concern  . None   Social History Narrative   Lives at home with husband.   Caffeine use: 1 cup coffee 3 times per week.    Past Surgical History  Procedure Laterality Date  . Cysts removed from ovary    . Cholecystectomy    . Dilation and curettage of uterus    . Breast surgery      left breast lumpectomy  . Breast biopsy      right  . Colonoscopy    . Esophagogastroduodenoscopy    . Lumbar laminectomy/decompression microdiscectomy  12/03/2011    Procedure: LUMBAR LAMINECTOMY/DECOMPRESSION MICRODISCECTOMY 2 LEVELS;  Surgeon: Floyce Stakes, MD;  Location: [place] [facility];  Service: Neurosurgery;  Laterality: Right;  Right Lumbar four-five Lumbar five sacral one Foraminotomies  .  Transforaminal lumbar interbody fusion (tlif) with pedicle screw fixation 2 level N/A 08/25/2013    Procedure:  Lumbar four-five, Lumbar five-Sacral one Transforaminal Lumbar Interbody Fusion ;  Surgeon: Floyce Stakes, MD;  Location: [place] [facility];  Service: Neurosurgery;  Laterality: N/A;  . Hematoma evacuation N/A 08/25/2013    Procedure: EVACUATION OF LUMBAR HEMATOMA;  Surgeon: Floyce Stakes, MD;  Location: [place] [facility];  Service: Neurosurgery;  Laterality: N/A;  . Placement of lumbar drain N/A 09/01/2013    Procedure: PLACEMENT OF LUMBAR DRAIN;  Surgeon: Floyce Stakes, MD;  Location: [place] [facility];  Service:  Neurosurgery;  Laterality: N/A;  . Lumbar wound debridement N/A 09/01/2013    Procedure: LUMBAR WOUND DEBRIDEMENT;  Surgeon: Floyce Stakes, MD;  Location: [facility];  Service: Neurosurgery;  Laterality: N/A;   Past Medical History  Diagnosis Date  . PONV (postoperative nausea and vomiting)   . Hypertension     takes Losartan daily  . Pneumonia     hx of 15+yrs ago  . Chronic back pain     ddd/lumbago,and radiculopathy  . Arthritis   . Gout     hx of  . GERD (gastroesophageal reflux disease)     takes Protonix daily  . H/O hiatal hernia   . Gastric ulcer   . Ulcerative colitis   . IBS (irritable bowel syndrome)   . Hx of colonic polyps   . Urinary incontinence   . Nocturia   . Hypothyroidism     takes Levothyroxine daily  . Depression   . Anxiety     takes celexa daily  . Asthma   . Bronchitis    BP 145/78 mmHg  Pulse 82  Resp 16  SpO2 96%  Opioid Risk Score:   Fall Risk Score: Moderate Fall Risk (6-13 points) (previously educated and given handout)`1  Depression screen PHQ 2/9  Depression screen PHQ 2/9 02/20/2015  Decreased Interest 2  Down, Depressed, Hopeless 3  PHQ - 2 Score 5  Altered sleeping 2  Tired, decreased energy 2  Change in appetite 0  Feeling bad or failure about yourself  0  Trouble concentrating 0  Moving slowly or fidgety/restless 0  Suicidal thoughts 0  PHQ-9 Score 9    Review of Systems  Gastrointestinal: Positive for nausea, vomiting, abdominal pain and constipation.       Bowel Control Problems  Genitourinary: Positive for difficulty urinating.       Bladder control problems  Musculoskeletal: Positive for gait problem.       Spasms  Neurological: Positive for tremors, weakness and numbness.       Tingling  Psychiatric/Behavioral: Positive for dysphoric mood. The patient is nervous/anxious.   All other systems reviewed and are negative.      Objective:   Physical Exam  Constitutional: She is oriented to person, place,  and time. She appears well-developed and well-nourished.  HENT:  Head: Normocephalic and atraumatic.  Neck: Normal range of motion. Neck supple.  Cardiovascular: Normal rate and regular rhythm.   Pulmonary/Chest: Effort normal and breath sounds normal.  Musculoskeletal:  Normal Muscle Bulk and Muscle Testing Reveals: Upper Extremities: Full ROM and Muscle Strength 5/5 Tremors noted with extension. Lumbar Paraspinal Tenderness: L-3- L-5 Right Lower Extremity Flexion Produces Spasm Left: Full ROM and Muscle Strength 5/5 Arises from chair with ease Narrow Based Gait    Neurological: She is alert and oriented to person, place, and time.  Skin: Skin is warm and dry.  Psychiatric: She has a normal mood and affect.  Nursing note and vitals reviewed.         Assessment & Plan:  1. Cauda equina syndrome: Incontinence: Wearing Depends:Continue to Monitor  2. Insomnia: Continue with Nortriptyline and Xanax  3. 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  Refilled: Percocet 10/325 mg #120--use 1 tablet every 6 hours as needed and MS Contin 30 mg one tablet every 12 hours as needed #60. 4. Depression: Continue with Celexa. Continue to Monitor  5. Anxiety: Continue with Xanax. Continue to Monitor  6.Neuropathy: Continue Pamelor and Continue To Monitor. 7. Constipation: Continue Senokot 8. Unsteady Gait: Neurology Following 9. Myoclonus: Discontinued Gabapentin: F?U with Neurology  20 minutes of face to face patient care time was spent during this visit. All questions were encouraged and answered.

## 2015-03-22 DIAGNOSIS — K219 Gastro-esophageal reflux disease without esophagitis: Secondary | ICD-10-CM | POA: Diagnosis not present

## 2015-03-22 DIAGNOSIS — E039 Hypothyroidism, unspecified: Secondary | ICD-10-CM | POA: Diagnosis not present

## 2015-03-22 DIAGNOSIS — E876 Hypokalemia: Secondary | ICD-10-CM | POA: Diagnosis not present

## 2015-04-25 ENCOUNTER — Encounter: Payer: Medicare Other | Attending: Physical Medicine and Rehabilitation

## 2015-04-25 ENCOUNTER — Other Ambulatory Visit: Payer: Self-pay | Admitting: Physical Medicine & Rehabilitation

## 2015-04-25 ENCOUNTER — Encounter: Payer: Self-pay | Admitting: Physical Medicine & Rehabilitation

## 2015-04-25 ENCOUNTER — Ambulatory Visit (HOSPITAL_BASED_OUTPATIENT_CLINIC_OR_DEPARTMENT_OTHER): Payer: Medicare Other | Admitting: Physical Medicine & Rehabilitation

## 2015-04-25 VITALS — BP 139/89 | HR 78 | Resp 14

## 2015-04-25 DIAGNOSIS — G039 Meningitis, unspecified: Secondary | ICD-10-CM | POA: Diagnosis not present

## 2015-04-25 DIAGNOSIS — Z79899 Other long term (current) drug therapy: Secondary | ICD-10-CM | POA: Insufficient documentation

## 2015-04-25 DIAGNOSIS — G834 Cauda equina syndrome: Secondary | ICD-10-CM | POA: Diagnosis not present

## 2015-04-25 DIAGNOSIS — Z5181 Encounter for therapeutic drug level monitoring: Secondary | ICD-10-CM | POA: Diagnosis not present

## 2015-04-25 DIAGNOSIS — M51369 Other intervertebral disc degeneration, lumbar region without mention of lumbar back pain or lower extremity pain: Secondary | ICD-10-CM

## 2015-04-25 DIAGNOSIS — G894 Chronic pain syndrome: Secondary | ICD-10-CM | POA: Diagnosis not present

## 2015-04-25 DIAGNOSIS — M5136 Other intervertebral disc degeneration, lumbar region: Secondary | ICD-10-CM | POA: Diagnosis not present

## 2015-04-25 DIAGNOSIS — M961 Postlaminectomy syndrome, not elsewhere classified: Secondary | ICD-10-CM

## 2015-04-25 MED ORDER — OXYCODONE-ACETAMINOPHEN 10-325 MG PO TABS: 1.0000 | ORAL_TABLET | Freq: Four times a day (QID) | ORAL | Status: DC | PRN

## 2015-04-25 MED ORDER — MORPHINE SULFATE ER 30 MG PO TBCR
30.0000 mg | EXTENDED_RELEASE_TABLET | Freq: Two times a day (BID) | ORAL | Status: DC
Start: 2015-04-25 — End: 2015-05-23

## 2015-04-25 NOTE — Patient Instructions (Signed)
We discussed a procedure called caudal epidural steroid injection. Because of scar tissue around L5 I don't think it'll go above L5 in most likely it will be limited to the sacral levels which involved the area around the buttocks as well as the feet. This will not help numbness but could help burning pain. The potential risks included bleeding bruising and infection risk is fairly low but it is possible and could even cause increased weakness. The duration of the effect is difficult to predict it could last for hours days weeks or months. If it lasts for months, it can be repeated when it wears off.

## 2015-04-25 NOTE — Progress Notes (Signed)
Subjective:    Patient ID: Sherry Wong, female    DOB: [birth year], 59 y.o.   MRN: [id number]  HPI 59 year old female with history of lumbar degenerative disc. She underwent lumbar discectomy as well as lumbar fusion with postoperative complication of hematoma formation resulting in cauda equina syndrome. She went through inpatient rehabilitation at [facility]. She then completed home health therapy. Patient then started a community-based program including aquatic therapy. She continues with her aquatic therapy. Patient's primary concern is low back pain going into the legs. Pain goes all the way to the feet. Patient has numbness in the feet but also has sharp pains traveling from the back to the feet. Pain awakens her from sleep. Patient has had weakness in her legs and requires a quad cane to ambulate. The weakness has improved somewhat since onset after her rehabilitation.  Patient has been on a combination of morphine and oxycodone. Also has been on gabapentin but this was recently discontinued due to possible side effect of tremors and jerks. Patient is also on nortriptyline.  Patient has had epidural injections before her surgery but no injections after the surgery.  Pain Inventory Average Pain 6 Pain Right Now 4 My pain is constant, sharp, burning, stabbing, tingling and aching  In the last 24 hours, has pain interfered with the following? General activity 7 Relation with others 7 Enjoyment of life 7 What TIME of day is your pain at its worst? ALL Sleep (in general) Fair  Pain is worse with: walking, bending, sitting, standing and some activites Pain improves with: rest, heat/ice and medication Relief from Meds: 4  Mobility walk without assistance how many minutes can you walk? 15-20 ability to climb steps?  yes do you drive?  yes  Function disabled: date disabled .  Neuro/Psych bladder control problems bowel control  problems weakness numbness tremor tingling trouble walking spasms depression anxiety  Prior Studies Any changes since last visit?  no  Physicians involved in your care Any changes since last visit?  no   Family History  Problem Relation Age of Onset  . Anesthesia problems Sister   . Hypotension Neg Hx   . Malignant hyperthermia Neg Hx   . Pseudochol deficiency Neg Hx   . Anesthesia problems Mother    History   Social History  . Marital Status: Married    Spouse Name: N/A  . Number of Children: 0  . Years of Education: 12   Occupational History  . Disabled    Social History Main Topics  . Smoking status: Never Smoker   . Smokeless tobacco: Never Used  . Alcohol Use: 0.0 oz/week    0 Standard drinks or equivalent per week     Comment: occ- 0.5 can beer every 2 months  . Drug Use: No  . Sexual Activity: Not Currently   Other Topics Concern  . None   Social History Narrative   Lives at home with husband.   Caffeine use: 1 cup coffee 3 times per week.    Past Surgical History  Procedure Laterality Date  . Cysts removed from ovary    . Cholecystectomy    . Dilation and curettage of uterus    . Breast surgery      left breast lumpectomy  . Breast biopsy      right  . Colonoscopy    . Esophagogastroduodenoscopy    . Lumbar laminectomy/decompression microdiscectomy  12/03/2011    Procedure: LUMBAR LAMINECTOMY/DECOMPRESSION MICRODISCECTOMY 2 LEVELS;  Surgeon:  Floyce Stakes, MD;  Location: [place] [facility];  Service: Neurosurgery;  Laterality: Right;  Right Lumbar four-five Lumbar five sacral one Foraminotomies  . Transforaminal lumbar interbody fusion (tlif) with pedicle screw fixation 2 level N/A 08/25/2013    Procedure:  Lumbar four-five, Lumbar five-Sacral one Transforaminal Lumbar Interbody Fusion ;  Surgeon: Floyce Stakes, MD;  Location: [place] [facility];  Service: Neurosurgery;  Laterality: N/A;  . Hematoma evacuation N/A 08/25/2013    Procedure: EVACUATION  OF LUMBAR HEMATOMA;  Surgeon: Floyce Stakes, MD;  Location: [place] [facility];  Service: Neurosurgery;  Laterality: N/A;  . Placement of lumbar drain N/A 09/01/2013    Procedure: PLACEMENT OF LUMBAR DRAIN;  Surgeon: Floyce Stakes, MD;  Location: [place] [facility];  Service: Neurosurgery;  Laterality: N/A;  . Lumbar wound debridement N/A 09/01/2013    Procedure: LUMBAR WOUND DEBRIDEMENT;  Surgeon: Floyce Stakes, MD;  Location: [facility];  Service: Neurosurgery;  Laterality: N/A;   Past Medical History  Diagnosis Date  . PONV (postoperative nausea and vomiting)   . Hypertension     takes Losartan daily  . Pneumonia     hx of 15+yrs ago  . Chronic back pain     ddd/lumbago,and radiculopathy  . Arthritis   . Gout     hx of  . GERD (gastroesophageal reflux disease)     takes Protonix daily  . H/O hiatal hernia   . Gastric ulcer   . Ulcerative colitis   . IBS (irritable bowel syndrome)   . Hx of colonic polyps   . Urinary incontinence   . Nocturia   . Hypothyroidism     takes Levothyroxine daily  . Depression   . Anxiety     takes celexa daily  . Asthma   . Bronchitis    BP 139/89 mmHg  Pulse 78  Resp 14  SpO2 99%  Opioid Risk Score:   Fall Risk Score:  `1  Depression screen PHQ 2/9  Depression screen [facility] 2/9 04/25/2015 02/20/2015  Decreased Interest 1 2  Down, Depressed, Hopeless 1 3  PHQ - 2 Score 2 5  Altered sleeping - 2  Tired, decreased energy - 2  Change in appetite - 0  Feeling bad or failure about yourself  - 0  Trouble concentrating - 0  Moving slowly or fidgety/restless - 0  Suicidal thoughts - 0  PHQ-9 Score - 9     Review of Systems  HENT: Negative.   Eyes: Negative.   Respiratory: Negative.   Cardiovascular: Negative.   Gastrointestinal:       Bowel control problems  Endocrine: Negative.   Genitourinary:       Bladder control problems  Musculoskeletal: Positive for myalgias, back pain and arthralgias.       Bilateral leg pain  Skin:  Negative.   Allergic/Immunologic: Negative.   Neurological: Positive for tremors, weakness and numbness.       Trouble walking, tingling, spasms  Hematological: Negative.   Psychiatric/Behavioral: Positive for dysphoric mood. The patient is nervous/anxious.        Objective:   Physical Exam  Constitutional: She is oriented to person, place, and time. She appears well-developed and well-nourished.  HENT:  Head: Normocephalic and atraumatic.  Eyes: Conjunctivae and EOM are normal. Pupils are equal, round, and reactive to light.  Neurological: She is alert and oriented to person, place, and time. A sensory deficit is present.  Reflex Scores:      Patellar reflexes  are 1+ on the right side and 1+ on the left side.      Achilles reflexes are 1+ on the right side and 1+ on the left side. Decreased sensation to pinprick in S1 and S2  Motor strength is 4/5 bilateral hip flexor, knee extensor, ankle dorsiflexor and plantar flexor with some pain inhibition.    Nursing note and vitals reviewed.  Ambulates with a quad cane, short step length, no evidence of tone drag or knee instability  Tenderness is along the lumbar paraspinals starting at L4 but also in the gluteal area. She also has tenderness along the upper sacral area. Her laminectomy incision extends to the gluteal cleft. Negative straight leg raise testing       Assessment & Plan:  1. 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 We discussed interventional pain management options with her. Given the extensive scarring in the L4-5 L5-S1 level would not recommend trans-laminar injections transforaminal would not likely provide adequate coverage for the diffuse nature of her complaints.  We discussed caudal epidural injections as a potential treatment option.  Because of her arachnoiditis around the L5-S1 level I would not expect medication to travel rostral to the S1 area. Therefore it is  unlikely to help much with her thigh pain or proximal leg pain. There could be some facial affect with foot pain. We explained that the caudal epidural is unlikely to improve any of her numbness. We also discussed that the duration of response is difficult to predict. It can be anywhere from hours i.e. Anesthetic phase 2 months. We discussed that if it was a 2 month or greater duration of response it can be repeated every 3 months as needed. We discussed the potential risks to the procedure including bleeding bruising infection. Hematoma formation is possible and this could result in additional weakness or numbness. This would be unlikely but possible. The patient wishes to think about this further before making any decision. Her husband seemed to be encouraging her but she was not ready to decide today. I have written her oxycodone and morphine prescriptions M.D. Or nurse practitioner visit in one month  Over half of the 40 minute visit was spent discussing the procedure going over the anatomy risks and benefits. Spine model was utilized

## 2015-04-26 LAB — PMP ALCOHOL METABOLITE (ETG): Ethyl Glucuronide (EtG): NEGATIVE ng/mL

## 2015-04-29 LAB — BENZODIAZEPINES (GC/LC/MS), URINE
Alprazolam metabolite (GC/LC/MS), ur confirm: 120 ng/mL (ref <25)
Clonazepam metabolite (GC/LC/MS), ur confirm: NEGATIVE ng/mL (ref <25)
Flurazepam metabolite (GC/LC/MS), ur confirm: NEGATIVE ng/mL (ref <50)
Lorazepam (GC/LC/MS), ur confirm: NEGATIVE ng/mL (ref <50)
Midazolam (GC/LC/MS), ur confirm: NEGATIVE ng/mL (ref <50)
Nordiazepam (GC/LC/MS), ur confirm: NEGATIVE ng/mL (ref <50)
Oxazepam (GC/LC/MS), ur confirm: NEGATIVE ng/mL (ref <50)
Temazepam (GC/LC/MS), ur confirm: NEGATIVE ng/mL (ref <50)
Triazolam metabolite (GC/LC/MS), ur confirm: NEGATIVE ng/mL (ref <50)

## 2015-04-29 LAB — OPIATES/OPIOIDS (LC/MS-MS)
Codeine Urine: NEGATIVE ng/mL (ref <50)
Hydrocodone: NEGATIVE ng/mL (ref <50)
Hydromorphone: 132 ng/mL (ref <50)
Morphine Urine: 23290 ng/mL (ref <50)
Norhydrocodone, Ur: NEGATIVE ng/mL (ref <50)
Noroxycodone, Ur: 4123 ng/mL (ref <50)
Oxycodone, ur: 2178 ng/mL (ref <50)
Oxymorphone: 1876 ng/mL (ref <50)

## 2015-04-29 LAB — OXYCODONE, URINE (LC/MS-MS)
Noroxycodone, Ur: 4123 ng/mL (ref <50)
Oxycodone, ur: 2178 ng/mL (ref <50)
Oxymorphone: 1876 ng/mL (ref <50)

## 2015-05-01 LAB — PRESCRIPTION MONITORING PROFILE (SOLSTAS)
Amphetamine/Meth: NEGATIVE ng/mL
Barbiturate Screen, Urine: NEGATIVE ng/mL
Buprenorphine, Urine: NEGATIVE ng/mL
Cannabinoid Scrn, Ur: NEGATIVE ng/mL
Carisoprodol, Urine: NEGATIVE ng/mL
Cocaine Metabolites: NEGATIVE ng/mL
Creatinine, Urine: 80.54 mg/dL (ref >20.0)
Fentanyl, Ur: NEGATIVE ng/mL
MDMA URINE: NEGATIVE ng/mL
Meperidine, Ur: NEGATIVE ng/mL
Methadone Screen, Urine: NEGATIVE ng/mL
Nitrites, Initial: NEGATIVE ug/mL
Propoxyphene: NEGATIVE ng/mL
Tapentadol, urine: NEGATIVE ng/mL
Tramadol Scrn, Ur: NEGATIVE ng/mL
Zolpidem, Urine: NEGATIVE ng/mL
pH, Initial: 5.6 pH (ref 4.5–8.9)

## 2015-05-09 ENCOUNTER — Ambulatory Visit: Payer: Medicare Other | Admitting: Neurology

## 2015-05-09 NOTE — Progress Notes (Signed)
Urine drug screen for this encounter is consistent for prescribed medication 

## 2015-05-23 ENCOUNTER — Encounter: Payer: Medicare Other | Attending: Physical Medicine and Rehabilitation | Admitting: Registered Nurse

## 2015-05-23 ENCOUNTER — Encounter: Payer: Self-pay | Admitting: Registered Nurse

## 2015-05-23 VITALS — BP 131/90 | HR 99

## 2015-05-23 DIAGNOSIS — M961 Postlaminectomy syndrome, not elsewhere classified: Secondary | ICD-10-CM

## 2015-05-23 DIAGNOSIS — M5136 Other intervertebral disc degeneration, lumbar region: Secondary | ICD-10-CM | POA: Diagnosis not present

## 2015-05-23 DIAGNOSIS — G894 Chronic pain syndrome: Secondary | ICD-10-CM

## 2015-05-23 DIAGNOSIS — Z79899 Other long term (current) drug therapy: Secondary | ICD-10-CM

## 2015-05-23 DIAGNOSIS — G039 Meningitis, unspecified: Secondary | ICD-10-CM | POA: Diagnosis not present

## 2015-05-23 DIAGNOSIS — G834 Cauda equina syndrome: Secondary | ICD-10-CM

## 2015-05-23 DIAGNOSIS — Z5181 Encounter for therapeutic drug level monitoring: Secondary | ICD-10-CM | POA: Diagnosis not present

## 2015-05-23 MED ORDER — OXYCODONE-ACETAMINOPHEN 10-325 MG PO TABS: 1.0000 | ORAL_TABLET | Freq: Four times a day (QID) | ORAL | Status: DC | PRN

## 2015-05-23 MED ORDER — NORTRIPTYLINE HCL 25 MG PO CAPS: 25.0000 mg | ORAL_CAPSULE | Freq: Every day | ORAL | Status: DC

## 2015-05-23 MED ORDER — MORPHINE SULFATE ER 30 MG PO TBCR: 30.0000 mg | EXTENDED_RELEASE_TABLET | Freq: Two times a day (BID) | ORAL | Status: DC

## 2015-05-23 NOTE — Progress Notes (Signed)
Subjective:    Patient ID: Sherry Wong, female    DOB: 29-Mar-1956, 59 y.o.   MRN: [id number]  HPI: [name] is a 59 year old female who returns for follow up for chronic pain and medication refill. She says her pain is located in her lower back radiating into her lower extremities laterally. She rates her pain 6. Her current exercise regime is pool therapy three times a week, walking and performing stretching exercises. Still complaining of tremors  Gabapentin has been weaned will decrease Pamelor to 25 mg HS she verbalizes understanding. Encouraged to follow up with her neurologist also instructed to call office on Monday 05/29/15 to evaluate medication changes she verbalizes understanding. She had an appointment with Dr. Letta Pate to discuss  different treatment modalities last month she's not interested in proceeding with injections.  Pain Inventory Average Pain 5 Pain Right Now 6 My pain is sharp, burning, stabbing, tingling and aching  In the last 24 hours, has pain interfered with the following? General activity 6 Relation with others 6 Enjoyment of life 7 What TIME of day is your pain at its worst? morning, daytime, evening, night Sleep (in general) Fair  Pain is worse with: walking, bending, standing and some activites Pain improves with: rest, heat/ice and medication Relief from Meds: 5  Mobility use a cane how many minutes can you walk? 10 to 20  ability to climb steps?  yes do you drive?  yes Do you have any goals in this area?  yes  Function disabled: date disabled 04/30/2012 I need assistance with the following:  meal prep, household duties and shopping Do you have any goals in this area?  yes  Neuro/Psych bladder control problems bowel control problems weakness numbness tremor tingling trouble walking spasms depression anxiety  Prior Studies Any changes since last visit?  no x-rays CT/MRI  Physicians involved in your care Any changes  since last visit?  no Primary care Jani Gravel Neurologist Antionette Fairy Neurosurgeon Dr. Jaynee Eagles   Family History  Problem Relation Age of Onset  . Anesthesia problems Sister   . Hypotension Neg Hx   . Malignant hyperthermia Neg Hx   . Pseudochol deficiency Neg Hx   . Anesthesia problems Mother    Social History   Social History  . Marital Status: Married    Spouse Name: N/A  . Number of Children: 0  . Years of Education: 12   Occupational History  . Disabled    Social History Main Topics  . Smoking status: Never Smoker   . Smokeless tobacco: Never Used  . Alcohol Use: 0.0 oz/week    0 Standard drinks or equivalent per week     Comment: occ- 0.5 can beer every 2 months  . Drug Use: No  . Sexual Activity: Not Currently   Other Topics Concern  . None   Social History Narrative   Lives at home with husband.   Caffeine use: 1 cup coffee 3 times per week.    Past Surgical History  Procedure Laterality Date  . Cysts removed from ovary    . Cholecystectomy    . Dilation and curettage of uterus    . Breast surgery      left breast lumpectomy  . Breast biopsy      right  . Colonoscopy    . Esophagogastroduodenoscopy    . Lumbar laminectomy/decompression microdiscectomy  12/03/2011    Procedure: LUMBAR LAMINECTOMY/DECOMPRESSION MICRODISCECTOMY 2 LEVELS;  Surgeon: Floyce Stakes,  MD;  Location: [place] [facility];  Service: Neurosurgery;  Laterality: Right;  Right Lumbar four-five Lumbar five sacral one Foraminotomies  . Transforaminal lumbar interbody fusion (tlif) with pedicle screw fixation 2 level N/A 08/25/2013    Procedure:  Lumbar four-five, Lumbar five-Sacral one Transforaminal Lumbar Interbody Fusion ;  Surgeon: Floyce Stakes, MD;  Location: [place] [facility];  Service: Neurosurgery;  Laterality: N/A;  . Hematoma evacuation N/A 08/25/2013    Procedure: EVACUATION OF LUMBAR HEMATOMA;  Surgeon: Floyce Stakes, MD;  Location: [place] [facility];  Service: Neurosurgery;   Laterality: N/A;  . Placement of lumbar drain N/A 09/01/2013    Procedure: PLACEMENT OF LUMBAR DRAIN;  Surgeon: Floyce Stakes, MD;  Location: [place] [facility];  Service: Neurosurgery;  Laterality: N/A;  . Lumbar wound debridement N/A 09/01/2013    Procedure: LUMBAR WOUND DEBRIDEMENT;  Surgeon: Floyce Stakes, MD;  Location: [facility];  Service: Neurosurgery;  Laterality: N/A;   Past Medical History  Diagnosis Date  . PONV (postoperative nausea and vomiting)   . Hypertension     takes Losartan daily  . Pneumonia     hx of 15+yrs ago  . Chronic back pain     ddd/lumbago,and radiculopathy  . Arthritis   . Gout     hx of  . GERD (gastroesophageal reflux disease)     takes Protonix daily  . H/O hiatal hernia   . Gastric ulcer   . Ulcerative colitis   . IBS (irritable bowel syndrome)   . Hx of colonic polyps   . Urinary incontinence   . Nocturia   . Hypothyroidism     takes Levothyroxine daily  . Depression   . Anxiety     takes celexa daily  . Asthma   . Bronchitis    BP 131/90 mmHg  Pulse 99  SpO2 96%  Opioid Risk Score:   Fall Risk Score:  `1  Depression screen PHQ 2/9  Depression screen [facility] 2/9 05/23/2015 04/25/2015 02/20/2015  Decreased Interest 1 1 2   Down, Depressed, Hopeless 1 1 3   PHQ - 2 Score 2 2 5   Altered sleeping - - 2  Tired, decreased energy - - 2  Change in appetite - - 0  Feeling bad or failure about yourself  - - 0  Trouble concentrating - - 0  Moving slowly or fidgety/restless - - 0  Suicidal thoughts - - 0  PHQ-9 Score - - 9     Review of Systems  Gastrointestinal: Positive for nausea, abdominal pain and constipation.  Genitourinary: Positive for difficulty urinating.  All other systems reviewed and are negative.      Objective:   Physical Exam  Constitutional: She is oriented to person, place, and time. She appears well-developed and well-nourished.  HENT:  Head: Normocephalic and atraumatic.  Neck: Normal range of motion. Neck  supple.  Cardiovascular: Normal rate and regular rhythm.   Pulmonary/Chest: Effort normal and breath sounds normal.  Musculoskeletal: She exhibits edema.  Normal Muscle Bulk and Muscle Testing Reveals: Upper Extremities: Full ROM and Muscle Strength 5/5 Lumbar Paraspinal Tenderness: L-3-L-5 Lower Extremities: Decreased ROM and Muscle Strength 5/5. Bilateral Lower Extremity Flexion Produces Pain into Lower Extremities and Lumbar Arises from chair slowly/ Using 4 Prong cane for support. Antalgic gait  Neurological: She is alert and oriented to person, place, and time.  Skin: Skin is warm and dry.  Psychiatric: She has a normal mood and affect.  Nursing note and vitals reviewed.  Assessment & Plan:  1. Cauda equina syndrome: Incontinence: Wearing Depends:Continue to Monitor  2. Insomnia: Continue with Nortriptyline and Xanax  3. 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  Refilled: Percocet 10/325 mg #120--use 1 tablet every 6 hours as needed and MS Contin 30 mg one tablet every 12 hours as needed #60. 4. Depression: Continue with Celexa. Continue to Monitor  5. Anxiety: Continue with Xanax. Continue to Monitor  6.Neuropathy: Continue Pamelor and Continue To Monitor. 7. Constipation: Continue Senokot 8. Unsteady Gait: Neurology Following 9. Myoclonus: Decreased Pamelor: F/U with Neurology  20 minutes of face to face patient care time was spent during this visit. All questions were encouraged and answered.

## 2015-05-31 ENCOUNTER — Telehealth: Payer: Self-pay | Admitting: *Deleted

## 2015-05-31 NOTE — Telephone Encounter (Signed)
Pt reports that medication changes appear to be working for now.Sherry KitchenMarland KitchenMarland KitchenMarland Kitchenpt just wanted to let you now

## 2015-06-27 ENCOUNTER — Encounter: Payer: Self-pay | Admitting: Registered Nurse

## 2015-06-27 ENCOUNTER — Encounter: Payer: Medicare Other | Attending: Physical Medicine and Rehabilitation | Admitting: Registered Nurse

## 2015-06-27 VITALS — BP 113/85 | HR 87 | Resp 14

## 2015-06-27 DIAGNOSIS — M5136 Other intervertebral disc degeneration, lumbar region: Secondary | ICD-10-CM | POA: Diagnosis not present

## 2015-06-27 DIAGNOSIS — M961 Postlaminectomy syndrome, not elsewhere classified: Secondary | ICD-10-CM | POA: Diagnosis not present

## 2015-06-27 DIAGNOSIS — M6249 Contracture of muscle, multiple sites: Secondary | ICD-10-CM

## 2015-06-27 DIAGNOSIS — Z79899 Other long term (current) drug therapy: Secondary | ICD-10-CM | POA: Insufficient documentation

## 2015-06-27 DIAGNOSIS — Z5181 Encounter for therapeutic drug level monitoring: Secondary | ICD-10-CM | POA: Insufficient documentation

## 2015-06-27 DIAGNOSIS — G834 Cauda equina syndrome: Secondary | ICD-10-CM | POA: Diagnosis not present

## 2015-06-27 DIAGNOSIS — G039 Meningitis, unspecified: Secondary | ICD-10-CM | POA: Diagnosis not present

## 2015-06-27 DIAGNOSIS — G894 Chronic pain syndrome: Secondary | ICD-10-CM

## 2015-06-27 DIAGNOSIS — M62838 Other muscle spasm: Secondary | ICD-10-CM

## 2015-06-27 MED ORDER — OXYCODONE-ACETAMINOPHEN 10-325 MG PO TABS: 1.0000 | ORAL_TABLET | Freq: Four times a day (QID) | ORAL | Status: DC | PRN

## 2015-06-27 MED ORDER — ALPRAZOLAM 0.25 MG PO TABS
0.2500 mg | ORAL_TABLET | Freq: Two times a day (BID) | ORAL | Status: DC | PRN
Start: 2015-06-27 — End: 2015-10-26

## 2015-06-27 MED ORDER — MORPHINE SULFATE ER 30 MG PO TBCR: 30.0000 mg | EXTENDED_RELEASE_TABLET | Freq: Two times a day (BID) | ORAL | Status: DC

## 2015-06-27 NOTE — Progress Notes (Signed)
Subjective:    Patient ID: Sherry Wong, female    DOB: 09/09/1956, 59 y.o.   MRN: [id number]  HPI: Sherry Wong is a 59 year old female who returns for follow up for chronic pain and medication refill. She says her pain is located in her lower back radiating into her lower extremities laterally and buttock pain. She rates her pain 5.Her current exercise regime is walking and performing stretching exercises. Will be resuming at the [facility] next week planning to attend 3-4 times a week. Also states decreasing the Pamelor 25 mg has decreased the tremors, will continue to monitor.   Pain Inventory Average Pain 6 Pain Right Now 5 My pain is sharp, burning, stabbing, tingling and aching  In the last 24 hours, has pain interfered with the following? General activity 7 Relation with others 7 Enjoyment of life 7 What TIME of day is your pain at its worst? all Sleep (in general) Fair  Pain is worse with: walking, bending, sitting, standing and some activites Pain improves with: rest, heat/ice and medication Relief from Meds: 5  Mobility walk with assistance use a cane how many minutes can you walk? 15-20 ability to climb steps?  yes do you drive?  yes Do you have any goals in this area?  yes  Function not employed: date last employed 12/03/2011 disabled: date disabled 04/30/2012 I need assistance with the following:  meal prep, household duties and shopping Do you have any goals in this area?  no  Neuro/Psych bladder control problems bowel control problems weakness numbness tremor tingling spasms depression anxiety  Prior Studies Any changes since last visit?  no  Physicians involved in your care Any changes since last visit?  no   Family History  Problem Relation Age of Onset  . Anesthesia problems Sister   . Hypotension Neg Hx   . Malignant hyperthermia Neg Hx   . Pseudochol deficiency Neg Hx   . Anesthesia problems Mother    Social History   Social  History  . Marital Status: Married    Spouse Name: N/A  . Number of Children: 0  . Years of Education: 12   Occupational History  . Disabled    Social History Main Topics  . Smoking status: Never Smoker   . Smokeless tobacco: Never Used  . Alcohol Use: 0.0 oz/week    0 Standard drinks or equivalent per week     Comment: occ- 0.5 can beer every 2 months  . Drug Use: No  . Sexual Activity: Not Currently   Other Topics Concern  . None   Social History Narrative   Lives at home with husband.   Caffeine use: 1 cup coffee 3 times per week.    Past Surgical History  Procedure Laterality Date  . Cysts removed from ovary    . Cholecystectomy    . Dilation and curettage of uterus    . Breast surgery      left breast lumpectomy  . Breast biopsy      right  . Colonoscopy    . Esophagogastroduodenoscopy    . Lumbar laminectomy/decompression microdiscectomy  12/03/2011    Procedure: LUMBAR LAMINECTOMY/DECOMPRESSION MICRODISCECTOMY 2 LEVELS;  Surgeon: Floyce Stakes, MD;  Location: [place] [facility];  Service: Neurosurgery;  Laterality: Right;  Right Lumbar four-five Lumbar five sacral one Foraminotomies  . Transforaminal lumbar interbody fusion (tlif) with pedicle screw fixation 2 level N/A 08/25/2013    Procedure:  Lumbar four-five, Lumbar five-Sacral one  Transforaminal Lumbar Interbody Fusion ;  Surgeon: Floyce Stakes, MD;  Location: [facility];  Service: Neurosurgery;  Laterality: N/A;  . Hematoma evacuation N/A 08/25/2013    Procedure: EVACUATION OF LUMBAR HEMATOMA;  Surgeon: Floyce Stakes, MD;  Location: [place] [facility];  Service: Neurosurgery;  Laterality: N/A;  . Placement of lumbar drain N/A 09/01/2013    Procedure: PLACEMENT OF LUMBAR DRAIN;  Surgeon: Floyce Stakes, MD;  Location: [place] [facility];  Service: Neurosurgery;  Laterality: N/A;  . Lumbar wound debridement N/A 09/01/2013    Procedure: LUMBAR WOUND DEBRIDEMENT;  Surgeon: Floyce Stakes, MD;  Location: [facility];   Service: Neurosurgery;  Laterality: N/A;   Past Medical History  Diagnosis Date  . PONV (postoperative nausea and vomiting)   . Hypertension     takes Losartan daily  . Pneumonia     hx of 15+yrs ago  . Chronic back pain     ddd/lumbago,and radiculopathy  . Arthritis   . Gout     hx of  . GERD (gastroesophageal reflux disease)     takes Protonix daily  . H/O hiatal hernia   . Gastric ulcer   . Ulcerative colitis   . IBS (irritable bowel syndrome)   . Hx of colonic polyps   . Urinary incontinence   . Nocturia   . Hypothyroidism     takes Levothyroxine daily  . Depression   . Anxiety     takes celexa daily  . Asthma   . Bronchitis    BP 113/85 mmHg  Pulse 87  Resp 14  SpO2 95%  Opioid Risk Score:   Fall Risk Score:  `1  Depression screen PHQ 2/9  Depression screen [facility] 2/9 05/23/2015 04/25/2015 02/20/2015  Decreased Interest 1 1 2   Down, Depressed, Hopeless 1 1 3   PHQ - 2 Score 2 2 5   Altered sleeping - - 2  Tired, decreased energy - - 2  Change in appetite - - 0  Feeling bad or failure about yourself  - - 0  Trouble concentrating - - 0  Moving slowly or fidgety/restless - - 0  Suicidal thoughts - - 0  PHQ-9 Score - - 9     Review of Systems  Gastrointestinal: Positive for nausea, vomiting, abdominal pain and constipation.  Genitourinary: Positive for dysuria.  Neurological: Positive for tremors, weakness and numbness.       Tingling spasms  Psychiatric/Behavioral: Positive for dysphoric mood. The patient is nervous/anxious.        Objective:   Physical Exam  Constitutional: She is oriented to person, place, and time. She appears well-developed and well-nourished.  HENT:  Head: Normocephalic and atraumatic.  Neck: Normal range of motion. Neck supple.  Cardiovascular: Normal rate and regular rhythm.   Pulmonary/Chest: Effort normal and breath sounds normal.  Musculoskeletal:  Normal Muscle Bulk and Muscle Testing Reveals: Upper Extremities: Full  ROM and Muscle Strength 5/5 Lumbar Paraspinal Tenderness: L-3- L-5 Lower Extremities: Full ROM and Muscle Strength 5/5 Right Lower Extremity Flexion Produces Pain into Lower Extremities Arises from chair slowly using 4 prong cane for support Antalgic gait  Neurological: She is alert and oriented to person, place, and time.  Skin: Skin is warm and dry.  Psychiatric: She has a normal mood and affect.  Nursing note and vitals reviewed.         Assessment & Plan:  1. Cauda equina syndrome: Incontinence: Wearing Depends:Continue to Monitor  2. Insomnia: Continue with Nortriptyline 3.  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  Refilled: Percocet 10/325 mg #120--use 1 tablet every 6 hours as needed and MS Contin 30 mg one tablet every 12 hours as needed #60. 4. Depression: Continue with Celexa. Continue to Monitor  5. Anxiety: Continue with Xanax. Continue to Monitor  6.Neuropathy: Continue Pamelor and Continue To Monitor. 7. Constipation: Continue Senokot 8. Unsteady Gait: Neurology Following 9. Myoclonus: Decreased Pamelor: Improvement Noted 10.Muscle Spasm: Continue Baclofen  20 minutes of face to face patient care time was spent during this visit. All questions were encouraged and answered.

## 2015-06-28 DIAGNOSIS — F329 Major depressive disorder, single episode, unspecified: Secondary | ICD-10-CM | POA: Diagnosis not present

## 2015-06-28 DIAGNOSIS — E039 Hypothyroidism, unspecified: Secondary | ICD-10-CM | POA: Diagnosis not present

## 2015-06-28 DIAGNOSIS — I10 Essential (primary) hypertension: Secondary | ICD-10-CM | POA: Diagnosis not present

## 2015-06-28 DIAGNOSIS — M545 Low back pain: Secondary | ICD-10-CM | POA: Diagnosis not present

## 2015-06-28 DIAGNOSIS — M4800 Spinal stenosis, site unspecified: Secondary | ICD-10-CM | POA: Diagnosis not present

## 2015-07-27 ENCOUNTER — Encounter: Payer: Medicare Other | Attending: Physical Medicine and Rehabilitation | Admitting: Registered Nurse

## 2015-07-27 ENCOUNTER — Encounter: Payer: Self-pay | Admitting: Registered Nurse

## 2015-07-27 ENCOUNTER — Other Ambulatory Visit: Payer: Self-pay | Admitting: Registered Nurse

## 2015-07-27 VITALS — BP 128/82 | HR 76 | Resp 16

## 2015-07-27 DIAGNOSIS — G894 Chronic pain syndrome: Secondary | ICD-10-CM

## 2015-07-27 DIAGNOSIS — M961 Postlaminectomy syndrome, not elsewhere classified: Secondary | ICD-10-CM

## 2015-07-27 DIAGNOSIS — Z79899 Other long term (current) drug therapy: Secondary | ICD-10-CM

## 2015-07-27 DIAGNOSIS — G039 Meningitis, unspecified: Secondary | ICD-10-CM

## 2015-07-27 DIAGNOSIS — M5136 Other intervertebral disc degeneration, lumbar region: Secondary | ICD-10-CM | POA: Diagnosis not present

## 2015-07-27 DIAGNOSIS — M51369 Other intervertebral disc degeneration, lumbar region without mention of lumbar back pain or lower extremity pain: Secondary | ICD-10-CM

## 2015-07-27 DIAGNOSIS — G834 Cauda equina syndrome: Secondary | ICD-10-CM | POA: Diagnosis not present

## 2015-07-27 DIAGNOSIS — Z5181 Encounter for therapeutic drug level monitoring: Secondary | ICD-10-CM | POA: Diagnosis not present

## 2015-07-27 MED ORDER — OXYCODONE-ACETAMINOPHEN 10-325 MG PO TABS: 1.0000 | ORAL_TABLET | Freq: Four times a day (QID) | ORAL | Status: DC | PRN

## 2015-07-27 MED ORDER — MORPHINE SULFATE ER 30 MG PO TBCR: 30.0000 mg | EXTENDED_RELEASE_TABLET | Freq: Two times a day (BID) | ORAL | Status: DC

## 2015-07-27 NOTE — Progress Notes (Signed)
Subjective:    Patient ID: Sherry Wong, female    DOB: 1956/05/23, 59 y.o.   MRN: [id number]  HPI: [name] is a 59 year old female who returns for follow up for chronic pain and medication refill. She says her pain is located in her lower back radiating into her buttocks and lower extremities laterally. She rates her pain 5.Her current exercise regime is walking and performing stretching exercises. Attending the  [employer] 3 times a week for pool therapy and using the elliptical bicycle.  Pain Inventory Average Pain 6 Pain Right Now 5 My pain is sharp, burning, dull, stabbing, tingling and aching  In the last 24 hours, has pain interfered with the following? General activity 7 Relation with others 7 Enjoyment of life 7 What TIME of day is your pain at its worst? Morning, Daytime, Evening, Night Sleep (in general) Fair  Pain is worse with: walking, bending, sitting, standing and some activites Pain improves with: rest and medication Relief from Meds: 4  Mobility use a cane how many minutes can you walk? 20 ability to climb steps?  yes do you drive?  yes Do you have any goals in this area?  yes  Function disabled: date disabled 04/30/2012 I need assistance with the following:  household duties and shopping Do you have any goals in this area?  yes  Neuro/Psych bladder control problems bowel control problems weakness numbness tingling trouble walking spasms depression  Prior Studies Any changes since last visit?  no  Physicians involved in your care Any changes since last visit?  no   Family History  Problem Relation Age of Onset  . Anesthesia problems Sister   . Hypotension Neg Hx   . Malignant hyperthermia Neg Hx   . Pseudochol deficiency Neg Hx   . Anesthesia problems Mother    Social History   Social History  . Marital Status: Married    Spouse Name: N/A  . Number of Children: 0  . Years of Education: 12   Occupational History  .  Disabled    Social History Main Topics  . Smoking status: Never Smoker   . Smokeless tobacco: Never Used  . Alcohol Use: 0.0 oz/week    0 Standard drinks or equivalent per week     Comment: occ- 0.5 can beer every 2 months  . Drug Use: No  . Sexual Activity: Not Currently   Other Topics Concern  . None   Social History Narrative   Lives at home with husband.   Caffeine use: 1 cup coffee 3 times per week.    Past Surgical History  Procedure Laterality Date  . Cysts removed from ovary    . Cholecystectomy    . Dilation and curettage of uterus    . Breast surgery      left breast lumpectomy  . Breast biopsy      right  . Colonoscopy    . Esophagogastroduodenoscopy    . Lumbar laminectomy/decompression microdiscectomy  12/03/2011    Procedure: LUMBAR LAMINECTOMY/DECOMPRESSION MICRODISCECTOMY 2 LEVELS;  Surgeon: Floyce Stakes, MD;  Location: [place] [facility];  Service: Neurosurgery;  Laterality: Right;  Right Lumbar four-five Lumbar five sacral one Foraminotomies  . Transforaminal lumbar interbody fusion (tlif) with pedicle screw fixation 2 level N/A 08/25/2013    Procedure:  Lumbar four-five, Lumbar five-Sacral one Transforaminal Lumbar Interbody Fusion ;  Surgeon: Floyce Stakes, MD;  Location: [place] [facility];  Service: Neurosurgery;  Laterality: N/A;  .  Hematoma evacuation N/A 08/25/2013    Procedure: EVACUATION OF LUMBAR HEMATOMA;  Surgeon: Floyce Stakes, MD;  Location: [place] [facility];  Service: Neurosurgery;  Laterality: N/A;  . Placement of lumbar drain N/A 09/01/2013    Procedure: PLACEMENT OF LUMBAR DRAIN;  Surgeon: Floyce Stakes, MD;  Location: [place] [facility];  Service: Neurosurgery;  Laterality: N/A;  . Lumbar wound debridement N/A 09/01/2013    Procedure: LUMBAR WOUND DEBRIDEMENT;  Surgeon: Floyce Stakes, MD;  Location: [facility];  Service: Neurosurgery;  Laterality: N/A;   Past Medical History  Diagnosis Date  . PONV (postoperative nausea and vomiting)   .  Hypertension     takes Losartan daily  . Pneumonia     hx of 15+yrs ago  . Chronic back pain     ddd/lumbago,and radiculopathy  . Arthritis   . Gout     hx of  . GERD (gastroesophageal reflux disease)     takes Protonix daily  . H/O hiatal hernia   . Gastric ulcer   . Ulcerative colitis   . IBS (irritable bowel syndrome)   . Hx of colonic polyps   . Urinary incontinence   . Nocturia   . Hypothyroidism     takes Levothyroxine daily  . Depression   . Anxiety     takes celexa daily  . Asthma   . Bronchitis    BP 128/82 mmHg  Pulse 76  Resp 16  SpO2 95%  Opioid Risk Score:   Fall Risk Score:  `1  Depression screen PHQ 2/9  Depression screen [facility] 2/9 05/23/2015 04/25/2015 02/20/2015  Decreased Interest 1 1 2   Down, Depressed, Hopeless 1 1 3   PHQ - 2 Score 2 2 5   Altered sleeping - - 2  Tired, decreased energy - - 2  Change in appetite - - 0  Feeling bad or failure about yourself  - - 0  Trouble concentrating - - 0  Moving slowly or fidgety/restless - - 0  Suicidal thoughts - - 0  PHQ-9 Score - - 9     Review of Systems  Gastrointestinal: Positive for nausea, abdominal pain and constipation.       Bowel Control Problems  Genitourinary:       Bladder Control Problems Urine Retention  Neurological: Positive for weakness and numbness.       Tingling Gait Instability  Psychiatric/Behavioral:       Depression       Objective:   Physical Exam  Constitutional: She is oriented to person, place, and time. She appears well-developed and well-nourished.  HENT:  Head: Normocephalic and atraumatic.  Neck: Normal range of motion. Neck supple.  Cardiovascular: Normal rate and regular rhythm.   Pulmonary/Chest: Effort normal and breath sounds normal.  Musculoskeletal:  Normal Muscle Bulk and Muscle Testing Reveals: Upper Extremities: Full ROM and Muscle Strength 5/5 Lumbar Paraspinal Tenderness: L-4- L-5 Bilateral Greater Trochanteric Tenderness Lower Extremities:  Full ROM and Muscle Strength 5/5 Arises from chair with ease Narrow Based Gait  Neurological: She is alert and oriented to person, place, and time.  Skin: Skin is warm and dry.  Psychiatric: She has a normal mood and affect.  Nursing note and vitals reviewed.         Assessment & Plan:  1. Cauda equina syndrome: Incontinence: Wearing Depends:Continue to Monitor  2. Insomnia: Continue with Nortriptyline 3. 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  Refilled: Percocet 10/325  mg #120--use 1 tablet every 6 hours as needed and MS Contin 30 mg one tablet every 12 hours as needed #60. 4. Depression: Continue with Lexapro. Continue to Monitor  5. Anxiety: Continue with Xanax. Continue to Monitor  6.Neuropathy: Continue Pamelor and Continue To Monitor. 7. Constipation: Continue Senokot 8. Unsteady Gait: Neurology Following 9. Myoclonus: Decreased Pamelor: Improvement Noted 10.Muscle Spasm: Continue Baclofen  20 minutes of face to face patient care time was spent during this visit. All questions were encouraged and answered.

## 2015-08-03 DIAGNOSIS — I10 Essential (primary) hypertension: Secondary | ICD-10-CM | POA: Diagnosis not present

## 2015-08-03 DIAGNOSIS — K219 Gastro-esophageal reflux disease without esophagitis: Secondary | ICD-10-CM | POA: Diagnosis not present

## 2015-08-03 DIAGNOSIS — E039 Hypothyroidism, unspecified: Secondary | ICD-10-CM | POA: Diagnosis not present

## 2015-08-03 DIAGNOSIS — E876 Hypokalemia: Secondary | ICD-10-CM | POA: Diagnosis not present

## 2015-08-22 ENCOUNTER — Other Ambulatory Visit: Payer: Self-pay | Admitting: Physical Medicine & Rehabilitation

## 2015-08-22 ENCOUNTER — Encounter: Payer: Self-pay | Admitting: Physical Medicine & Rehabilitation

## 2015-08-22 ENCOUNTER — Encounter: Payer: Medicare Other | Attending: Physical Medicine and Rehabilitation | Admitting: Physical Medicine & Rehabilitation

## 2015-08-22 DIAGNOSIS — M5136 Other intervertebral disc degeneration, lumbar region: Secondary | ICD-10-CM

## 2015-08-22 DIAGNOSIS — G252 Other specified forms of tremor: Secondary | ICD-10-CM

## 2015-08-22 DIAGNOSIS — G834 Cauda equina syndrome: Secondary | ICD-10-CM | POA: Diagnosis not present

## 2015-08-22 DIAGNOSIS — G039 Meningitis, unspecified: Secondary | ICD-10-CM | POA: Insufficient documentation

## 2015-08-22 DIAGNOSIS — Z5181 Encounter for therapeutic drug level monitoring: Secondary | ICD-10-CM | POA: Diagnosis not present

## 2015-08-22 DIAGNOSIS — Z79899 Other long term (current) drug therapy: Secondary | ICD-10-CM | POA: Insufficient documentation

## 2015-08-22 DIAGNOSIS — G894 Chronic pain syndrome: Secondary | ICD-10-CM | POA: Diagnosis not present

## 2015-08-22 MED ORDER — OXYCODONE-ACETAMINOPHEN 10-325 MG PO TABS: 1.0000 | ORAL_TABLET | Freq: Four times a day (QID) | ORAL | Status: DC | PRN

## 2015-08-22 MED ORDER — MORPHINE SULFATE ER 30 MG PO TBCR: 30.0000 mg | EXTENDED_RELEASE_TABLET | Freq: Two times a day (BID) | ORAL | Status: DC

## 2015-08-22 MED ORDER — PRIMIDONE 50 MG PO TABS: 50.0000 mg | ORAL_TABLET | Freq: Three times a day (TID) | ORAL | Status: DC

## 2015-08-22 MED ORDER — NORTRIPTYLINE HCL 50 MG PO CAPS: 50.0000 mg | ORAL_CAPSULE | Freq: Every day | ORAL | Status: DC

## 2015-08-22 NOTE — Progress Notes (Signed)
Subjective:    Patient ID: Sherry Wong, female    DOB: 10/06/1955, 59 y.o.   MRN: [id number]  HPI   Sherry Wong is here in follow up of her cauda equina syndrome and associated arachnoiditis. She has had ongoing tremors in both her upper and lower extremities. Her lower extremity pain has persisted as well. Her "tail bone" has hurt all the time. She doesn't think dropping the gabapentin has made a difference in her pain. She has noticed a difference with the decreased pamelor in regard to sleep/pain.   She is using a cane for balance. She tries to walk and stretch when she can.   Pain Inventory Average Pain 7 Pain Right Now 7 My pain is constant, sharp, burning, stabbing, tingling and aching  In the last 24 hours, has pain interfered with the following? General activity 7 Relation with others 7 Enjoyment of life 8 What TIME of day is your pain at its worst? constant Sleep (in general) Fair  Pain is worse with: walking, bending, standing and some activites Pain improves with: rest, heat/ice and medication Relief from Meds: no selection  Mobility walk with assistance use a cane how many minutes can you walk? 10-15 ability to climb steps?  yes do you drive?  yes Do you have any goals in this area?  yes  Function not employed: date last employed 12/03/2011 disabled: date disabled 04/30/2012 I need assistance with the following:  household duties and shopping Do you have any goals in this area?  yes  Neuro/Psych bladder control problems bowel control problems weakness numbness tremor tingling trouble walking spasms depression  Prior Studies Any changes since last visit?  no  Physicians involved in your care Tawni Carnes, Dr Jaynee Eagles, Dr Antionette Fairy   Family History  Problem Relation Age of Onset  . Anesthesia problems Sister   . Hypotension Neg Hx   . Malignant hyperthermia Neg Hx   . Pseudochol deficiency Neg Hx   . Anesthesia problems Mother     Social History   Social History  . Marital Status: Married    Spouse Name: N/A  . Number of Children: 0  . Years of Education: 12   Occupational History  . Disabled    Social History Main Topics  . Smoking status: Never Smoker   . Smokeless tobacco: Never Used  . Alcohol Use: 0.0 oz/week    0 Standard drinks or equivalent per week     Comment: occ- 0.5 can beer every 2 months  . Drug Use: No  . Sexual Activity: Not Currently   Other Topics Concern  . None   Social History Narrative   Lives at home with husband.   Caffeine use: 1 cup coffee 3 times per week.    Past Surgical History  Procedure Laterality Date  . Cysts removed from ovary    . Cholecystectomy    . Dilation and curettage of uterus    . Breast surgery      left breast lumpectomy  . Breast biopsy      right  . Colonoscopy    . Esophagogastroduodenoscopy    . Lumbar laminectomy/decompression microdiscectomy  12/03/2011    Procedure: LUMBAR LAMINECTOMY/DECOMPRESSION MICRODISCECTOMY 2 LEVELS;  Surgeon: Floyce Stakes, MD;  Location: [place] [facility];  Service: Neurosurgery;  Laterality: Right;  Right Lumbar four-five Lumbar five sacral one Foraminotomies  . Transforaminal lumbar interbody fusion (tlif) with pedicle screw fixation 2 level N/A 08/25/2013    Procedure:  Lumbar four-five, Lumbar five-Sacral one Transforaminal Lumbar Interbody Fusion ;  Surgeon: Floyce Stakes, MD;  Location: [facility];  Service: Neurosurgery;  Laterality: N/A;  . Hematoma evacuation N/A 08/25/2013    Procedure: EVACUATION OF LUMBAR HEMATOMA;  Surgeon: Floyce Stakes, MD;  Location: [place] [facility];  Service: Neurosurgery;  Laterality: N/A;  . Placement of lumbar drain N/A 09/01/2013    Procedure: PLACEMENT OF LUMBAR DRAIN;  Surgeon: Floyce Stakes, MD;  Location: [place] [facility];  Service: Neurosurgery;  Laterality: N/A;  . Lumbar wound debridement N/A 09/01/2013    Procedure: LUMBAR WOUND DEBRIDEMENT;  Surgeon: Floyce Stakes,  MD;  Location: [facility];  Service: Neurosurgery;  Laterality: N/A;   Past Medical History  Diagnosis Date  . PONV (postoperative nausea and vomiting)   . Hypertension     takes Losartan daily  . Pneumonia     hx of 15+yrs ago  . Chronic back pain     ddd/lumbago,and radiculopathy  . Arthritis   . Gout     hx of  . GERD (gastroesophageal reflux disease)     takes Protonix daily  . H/O hiatal hernia   . Gastric ulcer   . Ulcerative colitis   . IBS (irritable bowel syndrome)   . Hx of colonic polyps   . Urinary incontinence   . Nocturia   . Hypothyroidism     takes Levothyroxine daily  . Depression   . Anxiety     takes celexa daily  . Asthma   . Bronchitis    There were no vitals taken for this visit.  Opioid Risk Score:   Fall Risk Score:  `1  Depression screen PHQ 2/9  Depression screen [facility] 2/9 05/23/2015 04/25/2015 02/20/2015  Decreased Interest 1 1 2   Down, Depressed, Hopeless 1 1 3   PHQ - 2 Score 2 2 5   Altered sleeping - - 2  Tired, decreased energy - - 2  Change in appetite - - 0  Feeling bad or failure about yourself  - - 0  Trouble concentrating - - 0  Moving slowly or fidgety/restless - - 0  Suicidal thoughts - - 0  PHQ-9 Score - - 9       Review of Systems  Gastrointestinal: Positive for nausea, abdominal pain and constipation.  Musculoskeletal: Positive for myalgias, arthralgias and gait problem.  Hematological: Bruises/bleeds easily.  All other systems reviewed and are negative.      Objective:   Physical Exam  Constitutional: She is oriented to person, place, and time. She appears well-developed and well-nourished.  HENT:  Head: Normocephalic and atraumatic.  Neck: Normal range of motion. Neck supple.  Cardiovascular: Normal rate and regular rhythm.  Pulmonary/Chest: Effort normal and breath sounds normal.  Musculoskeletal:  Normal Muscle Bulk and Muscle.  Lower Extremities: Full ROM and Muscle Strength 5/5 with pain inhibition  noted. Arises from chair with extra time. Intentional fine to medium frequency tremor in bilateral UE and LE's . Antalgic Gait using cane for balance. Sensation remains generally diminished below the waist except for medial thigh. DTR's 1+. No resting tone or spasms.  Neurological: She is alert and oriented to person, place, and time.  Skin: Skin is warm and dry.  Psychiatric: mood pleasant, overall improved today.   Assessment & Plan:   1. Cauda equina syndrome with arachnoiditis: pt with substantial pain and ongoing sensory loss.  2. Insomnia: Continue with Trazodone and Xanax  3. DDD L4-S1 s/p diskectomy with  decompression of thecal sac: She continues to have back pain.  -refilled: Percocet 10/325 mg #100--1 q6 prn.  -continue ms contin to 30mg  q12 #60  4. Depression: Continue with Celexa. Continue to Monitor  5. Anxiety: Continue with Xanax  6. Neuropathic pain: continue to hold gabapentin  -nortriptyline---increase back to 50mg  qhs---she'll begin once her mysoline is up to its full, initial schedule   7. 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 All questions were encouraged and answered.  Still can consider caudal block to decrease pain symptoms.

## 2015-08-22 NOTE — Patient Instructions (Signed)
MYSOLINE---TREMOR  50MG  AT NIGHT FOR 3 NIGHTS, THEN TWICE DAILY FOR 3 DAYS, THEN THREE X DAILY.  AFTER YOU'RE UP TO THE FULL AMOUNT OF MYSOLINE---THEN INCREASE TO THE 50MG  OF THE NORTRIPTYLINE    PLEASE CALL ME WITH ANY PROBLEMS OR QUESTIONS [id number]). HAVE A HAPPY HOLIDAY SEASON!!!

## 2015-08-23 LAB — PMP ALCOHOL METABOLITE (ETG): Ethyl Glucuronide (EtG): NEGATIVE ng/mL

## 2015-08-28 LAB — BENZODIAZEPINES (GC/LC/MS), URINE
Alprazolam metabolite (GC/LC/MS), ur confirm: 90 ng/mL (ref <25)
Clonazepam metabolite (GC/LC/MS), ur confirm: NEGATIVE ng/mL (ref <25)
Flurazepam metabolite (GC/LC/MS), ur confirm: NEGATIVE ng/mL (ref <50)
Lorazepam (GC/LC/MS), ur confirm: NEGATIVE ng/mL (ref <50)
Midazolam (GC/LC/MS), ur confirm: NEGATIVE ng/mL (ref <50)
Nordiazepam (GC/LC/MS), ur confirm: NEGATIVE ng/mL (ref <50)
Oxazepam (GC/LC/MS), ur confirm: NEGATIVE ng/mL (ref <50)
Temazepam (GC/LC/MS), ur confirm: NEGATIVE ng/mL (ref <50)
Triazolam metabolite (GC/LC/MS), ur confirm: NEGATIVE ng/mL (ref <50)

## 2015-08-28 LAB — OXYCODONE, URINE (LC/MS-MS)
Noroxycodone, Ur: 2558 ng/mL (ref <50)
Oxycodone, ur: 875 ng/mL (ref <50)
Oxymorphone: 731 ng/mL (ref <50)

## 2015-08-28 LAB — OPIATES/OPIOIDS (LC/MS-MS)
Codeine Urine: NEGATIVE ng/mL (ref <50)
Hydrocodone: NEGATIVE ng/mL (ref <50)
Hydromorphone: 105 ng/mL (ref <50)
Morphine Urine: 18719 ng/mL (ref <50)
Norhydrocodone, Ur: NEGATIVE ng/mL (ref <50)
Noroxycodone, Ur: 2558 ng/mL (ref <50)
Oxycodone, ur: 875 ng/mL (ref <50)
Oxymorphone: 731 ng/mL (ref <50)

## 2015-08-29 LAB — PRESCRIPTION MONITORING PROFILE (SOLSTAS)
Amphetamine/Meth: NEGATIVE ng/mL
Barbiturate Screen, Urine: NEGATIVE ng/mL
Buprenorphine, Urine: NEGATIVE ng/mL
Cannabinoid Scrn, Ur: NEGATIVE ng/mL
Carisoprodol, Urine: NEGATIVE ng/mL
Cocaine Metabolites: NEGATIVE ng/mL
Creatinine, Urine: 64.55 mg/dL (ref >20.0)
Fentanyl, Ur: NEGATIVE ng/mL
MDMA URINE: NEGATIVE ng/mL
Meperidine, Ur: NEGATIVE ng/mL
Methadone Screen, Urine: NEGATIVE ng/mL
Nitrites, Initial: NEGATIVE ug/mL
Propoxyphene: NEGATIVE ng/mL
Tapentadol, urine: NEGATIVE ng/mL
Tramadol Scrn, Ur: NEGATIVE ng/mL
Zolpidem, Urine: NEGATIVE ng/mL
pH, Initial: 5.8 pH (ref 4.5–8.9)

## 2015-08-30 NOTE — Progress Notes (Signed)
Urine drug screen for this encounter is consistent for prescribed medication 

## 2015-09-21 ENCOUNTER — Encounter: Payer: Medicare Other | Admitting: Registered Nurse

## 2015-09-27 ENCOUNTER — Encounter: Payer: Medicare Other | Attending: Physical Medicine and Rehabilitation | Admitting: Registered Nurse

## 2015-09-27 ENCOUNTER — Encounter: Payer: Self-pay | Admitting: Registered Nurse

## 2015-09-27 VITALS — BP 125/78 | HR 91

## 2015-09-27 DIAGNOSIS — G039 Meningitis, unspecified: Secondary | ICD-10-CM

## 2015-09-27 DIAGNOSIS — G834 Cauda equina syndrome: Secondary | ICD-10-CM | POA: Diagnosis not present

## 2015-09-27 DIAGNOSIS — G894 Chronic pain syndrome: Secondary | ICD-10-CM

## 2015-09-27 DIAGNOSIS — Z79899 Other long term (current) drug therapy: Secondary | ICD-10-CM | POA: Insufficient documentation

## 2015-09-27 DIAGNOSIS — M62838 Other muscle spasm: Secondary | ICD-10-CM

## 2015-09-27 DIAGNOSIS — M5136 Other intervertebral disc degeneration, lumbar region: Secondary | ICD-10-CM | POA: Diagnosis not present

## 2015-09-27 DIAGNOSIS — M961 Postlaminectomy syndrome, not elsewhere classified: Secondary | ICD-10-CM

## 2015-09-27 DIAGNOSIS — Z5181 Encounter for therapeutic drug level monitoring: Secondary | ICD-10-CM | POA: Insufficient documentation

## 2015-09-27 MED ORDER — OXYCODONE-ACETAMINOPHEN 10-325 MG PO TABS: 1.0000 | ORAL_TABLET | Freq: Four times a day (QID) | ORAL | Status: DC | PRN

## 2015-09-27 MED ORDER — BACLOFEN 20 MG PO TABS: ORAL_TABLET | ORAL | Status: DC

## 2015-09-27 MED ORDER — MORPHINE SULFATE ER 30 MG PO TBCR: 30.0000 mg | EXTENDED_RELEASE_TABLET | Freq: Two times a day (BID) | ORAL | Status: DC

## 2015-09-27 NOTE — Progress Notes (Signed)
Subjective:    Patient ID: Sherry Wong, female    DOB: 20-Jan-1956, 59 y.o.   MRN: [id number]  HPI: [name] is a 59 year old female who returns for follow up for chronic pain and medication refill. She says her pain is located in her lower back radiating into her buttocks and lower extremities laterally. She rates her pain 6.Her current exercise regime is walking and performing stretching exercises.  [name] states she had two occassions when she woke up choking and her husband woke her [place] stating she was choking. She states she noticed this occurrence when she started on the Mysoline TID. Also states she was taking her MS Contin, Nortriptyline and Mysoline together. We will discontinued the Mysoline today, [name] was concerned due to her tremors have improved. Discussed with Dr. Letta Pate and agrees with the plan. [name] instructed to call office on Friday 09/29/2015 to evaluate medication regimen, she verbalizes understanding. Also educated on medication administration time schedule she verbalizes understanding.  Pain Inventory Average Pain 7 Pain Right Now 6 My pain is NA  In the last 24 hours, has pain interfered with the following? General activity 7 Relation with others 7 Enjoyment of life 7 What TIME of day is your pain at its worst? Morning, Daytime, Evening and Night Sleep (in general) Fair  Pain is worse with: walking, bending, standing and some activites Pain improves with: rest, heat/ice and medication Relief from Meds: 5  Mobility walk with assistance use a cane how many minutes can you walk? 15-20 ability to climb steps?  yes do you drive?  yes Do you have any goals in this area?  yes  Function disabled: date disabled 04/30/2012 I need assistance with the following:  meal prep, household duties and shopping Do you have any goals in this area?  yes  Neuro/Psych bladder control problems bowel control  problems weakness numbness tremor tingling trouble walking spasms depression  Prior Studies Any changes since last visit?  no  Physicians involved in your care Any changes since last visit?  no   Family History  Problem Relation Age of Onset  . Anesthesia problems Sister   . Hypotension Neg Hx   . Malignant hyperthermia Neg Hx   . Pseudochol deficiency Neg Hx   . Anesthesia problems Mother    Social History   Social History  . Marital Status: Married    Spouse Name: N/A  . Number of Children: 0  . Years of Education: 12   Occupational History  . Disabled    Social History Main Topics  . Smoking status: Never Smoker   . Smokeless tobacco: Never Used  . Alcohol Use: 0.0 oz/week    0 Standard drinks or equivalent per week     Comment: occ- 0.5 can beer every 2 months  . Drug Use: No  . Sexual Activity: Not Currently   Other Topics Concern  . None   Social History Narrative   Lives at home with husband.   Caffeine use: 1 cup coffee 3 times per week.    Past Surgical History  Procedure Laterality Date  . Cysts removed from ovary    . Cholecystectomy    . Dilation and curettage of uterus    . Breast surgery      left breast lumpectomy  . Breast biopsy      right  . Colonoscopy    . Esophagogastroduodenoscopy    . Lumbar laminectomy/decompression microdiscectomy  12/03/2011  Procedure: LUMBAR LAMINECTOMY/DECOMPRESSION MICRODISCECTOMY 2 LEVELS;  Surgeon: Floyce Stakes, MD;  Location: [place] [facility];  Service: Neurosurgery;  Laterality: Right;  Right Lumbar four-five Lumbar five sacral one Foraminotomies  . Transforaminal lumbar interbody fusion (tlif) with pedicle screw fixation 2 level N/A 08/25/2013    Procedure:  Lumbar four-five, Lumbar five-Sacral one Transforaminal Lumbar Interbody Fusion ;  Surgeon: Floyce Stakes, MD;  Location: [place] [facility];  Service: Neurosurgery;  Laterality: N/A;  . Hematoma evacuation N/A 08/25/2013    Procedure: EVACUATION  OF LUMBAR HEMATOMA;  Surgeon: Floyce Stakes, MD;  Location: [place] [facility];  Service: Neurosurgery;  Laterality: N/A;  . Placement of lumbar drain N/A 09/01/2013    Procedure: PLACEMENT OF LUMBAR DRAIN;  Surgeon: Floyce Stakes, MD;  Location: [place] [facility];  Service: Neurosurgery;  Laterality: N/A;  . Lumbar wound debridement N/A 09/01/2013    Procedure: LUMBAR WOUND DEBRIDEMENT;  Surgeon: Floyce Stakes, MD;  Location: [facility];  Service: Neurosurgery;  Laterality: N/A;   Past Medical History  Diagnosis Date  . PONV (postoperative nausea and vomiting)   . Hypertension     takes Losartan daily  . Pneumonia     hx of 15+yrs ago  . Chronic back pain     ddd/lumbago,and radiculopathy  . Arthritis   . Gout     hx of  . GERD (gastroesophageal reflux disease)     takes Protonix daily  . H/O hiatal hernia   . Gastric ulcer   . Ulcerative colitis   . IBS (irritable bowel syndrome)   . Hx of colonic polyps   . Urinary incontinence   . Nocturia   . Hypothyroidism     takes Levothyroxine daily  . Depression   . Anxiety     takes celexa daily  . Asthma   . Bronchitis    BP 125/78 mmHg  Pulse 91  SpO2 95%  Opioid Risk Score:   Fall Risk Score:  `1  Depression screen PHQ 2/9  Depression screen [facility] 2/9 05/23/2015 04/25/2015 02/20/2015  Decreased Interest 1 1 2   Down, Depressed, Hopeless 1 1 3   PHQ - 2 Score 2 2 5   Altered sleeping - - 2  Tired, decreased energy - - 2  Change in appetite - - 0  Feeling bad or failure about yourself  - - 0  Trouble concentrating - - 0  Moving slowly or fidgety/restless - - 0  Suicidal thoughts - - 0  PHQ-9 Score - - 9      Review of Systems  Gastrointestinal: Positive for abdominal pain and constipation.       Bowel Control Problems  Genitourinary:       Bladder Control Problems Urine Retention  Musculoskeletal:       Spasms  Neurological: Positive for tremors, weakness and numbness.       Tingling  Psychiatric/Behavioral:        Depression       Objective:   Physical Exam  Constitutional: She is oriented to person, place, and time. She appears well-developed and well-nourished.  HENT:  Head: Normocephalic and atraumatic.  Neck: Normal range of motion. Neck supple.  Cardiovascular: Normal rate and regular rhythm.   Pulmonary/Chest: Effort normal and breath sounds normal.  Musculoskeletal:  Normal Muscle Bulk and Muscle Testing Reveals: Upper Extremities: Full ROM and Muscle Strength 5/5 Thoracic Hypersensitivity T-7- T-12 Lumbar Paraspinal Tenderness: L-3- L-5 Lower Extremities: Full ROM and Muscle Strength 5/5 Arises from chair  with ease Narrow Based gait  Neurological: She is alert and oriented to person, place, and time.  Skin: Skin is warm and dry.  Psychiatric: She has a normal mood and affect.  Nursing note and vitals reviewed.         Assessment & Plan:  1. Cauda equina syndrome: Incontinence: Wearing Depends:Continue to Monitor  2. Insomnia: Continue with Nortriptyline 3. 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  Refilled: Percocet 10/325 mg #120--use 1 tablet every 6 hours as needed and MS Contin 30 mg one tablet every 12 hours as needed #60. 4. Depression: Continue with Lexapro. Continue to Monitor  5. Anxiety: Continue with Xanax. Continue to Monitor  6.Neuropathy: Continue Pamelor and Continue To Monitor. 7. Constipation: Continue Senokot 8.. Myoclonus: Was Prescribed Mysoline adverse effect Discontinued. Instructed to call office on 09/29/15 medication evaluation. She verbalizes understanding. 10.Muscle Spasm: Continue Baclofen  20 minutes of face to face patient care time was spent during this visit. All questions were encouraged and answered.

## 2015-09-29 ENCOUNTER — Telehealth: Payer: Self-pay | Admitting: *Deleted

## 2015-09-29 NOTE — Telephone Encounter (Signed)
Sherry Wong called to let [name] know that the medications change is helping and she is doing better.  Message given to [place].

## 2015-10-18 DIAGNOSIS — I10 Essential (primary) hypertension: Secondary | ICD-10-CM | POA: Diagnosis not present

## 2015-10-18 DIAGNOSIS — M545 Low back pain: Secondary | ICD-10-CM | POA: Diagnosis not present

## 2015-10-18 DIAGNOSIS — E039 Hypothyroidism, unspecified: Secondary | ICD-10-CM | POA: Diagnosis not present

## 2015-10-18 DIAGNOSIS — M4800 Spinal stenosis, site unspecified: Secondary | ICD-10-CM | POA: Diagnosis not present

## 2015-10-23 DIAGNOSIS — Z1231 Encounter for screening mammogram for malignant neoplasm of breast: Secondary | ICD-10-CM | POA: Diagnosis not present

## 2015-10-23 DIAGNOSIS — Z9889 Other specified postprocedural states: Secondary | ICD-10-CM | POA: Diagnosis not present

## 2015-10-26 ENCOUNTER — Encounter: Payer: Self-pay | Admitting: Registered Nurse

## 2015-10-26 ENCOUNTER — Encounter: Payer: Medicare Other | Attending: Physical Medicine and Rehabilitation | Admitting: Registered Nurse

## 2015-10-26 VITALS — BP 123/83 | HR 95 | Resp 14

## 2015-10-26 DIAGNOSIS — G039 Meningitis, unspecified: Secondary | ICD-10-CM | POA: Diagnosis not present

## 2015-10-26 DIAGNOSIS — G834 Cauda equina syndrome: Secondary | ICD-10-CM | POA: Diagnosis not present

## 2015-10-26 DIAGNOSIS — G894 Chronic pain syndrome: Secondary | ICD-10-CM

## 2015-10-26 DIAGNOSIS — Z5181 Encounter for therapeutic drug level monitoring: Secondary | ICD-10-CM | POA: Diagnosis not present

## 2015-10-26 DIAGNOSIS — Z79899 Other long term (current) drug therapy: Secondary | ICD-10-CM

## 2015-10-26 DIAGNOSIS — M51369 Other intervertebral disc degeneration, lumbar region without mention of lumbar back pain or lower extremity pain: Secondary | ICD-10-CM

## 2015-10-26 DIAGNOSIS — M961 Postlaminectomy syndrome, not elsewhere classified: Secondary | ICD-10-CM | POA: Diagnosis not present

## 2015-10-26 DIAGNOSIS — M5136 Other intervertebral disc degeneration, lumbar region: Secondary | ICD-10-CM | POA: Diagnosis not present

## 2015-10-26 DIAGNOSIS — M62838 Other muscle spasm: Secondary | ICD-10-CM

## 2015-10-26 MED ORDER — MORPHINE SULFATE ER 30 MG PO TBCR: 30.0000 mg | EXTENDED_RELEASE_TABLET | Freq: Two times a day (BID) | ORAL | Status: DC

## 2015-10-26 MED ORDER — ALPRAZOLAM 0.25 MG PO TABS: 0.2500 mg | ORAL_TABLET | Freq: Two times a day (BID) | ORAL | Status: DC | PRN

## 2015-10-26 MED ORDER — OXYCODONE-ACETAMINOPHEN 10-325 MG PO TABS: 1.0000 | ORAL_TABLET | Freq: Four times a day (QID) | ORAL | Status: DC | PRN

## 2015-10-26 NOTE — Progress Notes (Signed)
Subjective:    Patient ID: Sherry Wong, female    DOB: [birth year], 60 y.o.   MRN: [id number]  HPI: Sherry Wong is a 60 year old female who returns for follow up for chronic pain and medication refill. She says her pain is located in her lower back radiating into her buttocks and lower extremities laterally and bilateral feet. She rates her pain 6.Her current exercise regime is walking and performing stretching exercises.   Pain Inventory Average Pain 7 Pain Right Now 6 My pain is sharp, burning, stabbing, tingling and aching  In the last 24 hours, has pain interfered with the following? General activity 7 Relation with others 7 Enjoyment of life 7 What TIME of day is your pain at its worst? all Sleep (in general) Fair  Pain is worse with: walking, bending, sitting, standing and some activites Pain improves with: rest, heat/ice and medication Relief from Meds: 3  Mobility walk with assistance use a cane how many minutes can you walk? 15 ability to climb steps?  yes do you drive?  yes Do you have any goals in this area?  yes  Function disabled: date disabled . I need assistance with the following:  household duties and shopping Do you have any goals in this area?  yes  Neuro/Psych bladder control problems bowel control problems weakness trouble walking spasms depression anxiety  Prior Studies Any changes since last visit?  no  Physicians involved in your care Any changes since last visit?  no   Family History  Problem Relation Age of Onset  . Anesthesia problems Sister   . Hypotension Neg Hx   . Malignant hyperthermia Neg Hx   . Pseudochol deficiency Neg Hx   . Anesthesia problems Mother    Social History   Social History  . Marital Status: Married    Spouse Name: N/A  . Number of Children: 0  . Years of Education: 12   Occupational History  . Disabled    Social History Main Topics  . Smoking status: Never Smoker   . Smokeless  tobacco: Never Used  . Alcohol Use: 0.0 oz/week    0 Standard drinks or equivalent per week     Comment: occ- 0.5 can beer every 2 months  . Drug Use: No  . Sexual Activity: Not Currently   Other Topics Concern  . None   Social History Narrative   Lives at home with husband.   Caffeine use: 1 cup coffee 3 times per week.    Past Surgical History  Procedure Laterality Date  . Cysts removed from ovary    . Cholecystectomy    . Dilation and curettage of uterus    . Breast surgery      left breast lumpectomy  . Breast biopsy      right  . Colonoscopy    . Esophagogastroduodenoscopy    . Lumbar laminectomy/decompression microdiscectomy  12/03/2011    Procedure: LUMBAR LAMINECTOMY/DECOMPRESSION MICRODISCECTOMY 2 LEVELS;  Surgeon: Floyce Stakes, MD;  Location: [place] [facility];  Service: Neurosurgery;  Laterality: Right;  Right Lumbar four-five Lumbar five sacral one Foraminotomies  . Transforaminal lumbar interbody fusion (tlif) with pedicle screw fixation 2 level N/A 08/25/2013    Procedure:  Lumbar four-five, Lumbar five-Sacral one Transforaminal Lumbar Interbody Fusion ;  Surgeon: Floyce Stakes, MD;  Location: [place] [facility];  Service: Neurosurgery;  Laterality: N/A;  . Hematoma evacuation N/A 08/25/2013    Procedure: EVACUATION OF LUMBAR HEMATOMA;  Surgeon: Floyce Stakes, MD;  Location: [facility];  Service: Neurosurgery;  Laterality: N/A;  . Placement of lumbar drain N/A 09/01/2013    Procedure: PLACEMENT OF LUMBAR DRAIN;  Surgeon: Floyce Stakes, MD;  Location: [place] [facility];  Service: Neurosurgery;  Laterality: N/A;  . Lumbar wound debridement N/A 09/01/2013    Procedure: LUMBAR WOUND DEBRIDEMENT;  Surgeon: Floyce Stakes, MD;  Location: [facility];  Service: Neurosurgery;  Laterality: N/A;   Past Medical History  Diagnosis Date  . PONV (postoperative nausea and vomiting)   . Hypertension     takes Losartan daily  . Pneumonia     hx of 15+yrs ago  . Chronic back pain      ddd/lumbago,and radiculopathy  . Arthritis   . Gout     hx of  . GERD (gastroesophageal reflux disease)     takes Protonix daily  . H/O hiatal hernia   . Gastric ulcer   . Ulcerative colitis   . IBS (irritable bowel syndrome)   . Hx of colonic polyps   . Urinary incontinence   . Nocturia   . Hypothyroidism     takes Levothyroxine daily  . Depression   . Anxiety     takes celexa daily  . Asthma   . Bronchitis    BP 123/83 mmHg  Pulse 95  Resp 14  SpO2 96%  Opioid Risk Score:   Fall Risk Score:  `1  Depression screen PHQ 2/9  Depression screen [facility] 2/9 05/23/2015 04/25/2015 02/20/2015  Decreased Interest 1 1 2   Down, Depressed, Hopeless 1 1 3   PHQ - 2 Score 2 2 5   Altered sleeping - - 2  Tired, decreased energy - - 2  Change in appetite - - 0  Feeling bad or failure about yourself  - - 0  Trouble concentrating - - 0  Moving slowly or fidgety/restless - - 0  Suicidal thoughts - - 0  PHQ-9 Score - - 9    Review of Systems  Genitourinary:       Retention  All other systems reviewed and are negative.      Objective:   Physical Exam  Constitutional: She is oriented to person, place, and time. She appears well-developed and well-nourished.  HENT:  Head: Normocephalic and atraumatic.  Neck: Normal range of motion. Neck supple.  Cardiovascular: Normal rate and regular rhythm.   Pulmonary/Chest: Effort normal and breath sounds normal.  Musculoskeletal:  Normal Muscle Bulk and Muscle Testing Reveals: Upper Extremities: Full ROM and Muscle Strength 5/5 Thoracic Paraspinal Tenderness: T-7- T-9 Lumbar Hypersensitivity Lower Extremities: Decreased ROM and Muscle Strength 5/5 Bilateral Lower Extremities Flexion Produces pain into Lower Extremities Arises from chair slowly using 4 prong cane for support Antalgic Gait  Neurological: She is alert and oriented to person, place, and time.  Skin: Skin is warm and dry.  Psychiatric: She has a normal mood and affect.    Nursing note and vitals reviewed.         Assessment & Plan:  1. Cauda equina syndrome: Incontinence: Wearing Depends:Continue to Monitor  2. Insomnia: Continue with Nortriptyline 3. 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  Refilled: Percocet 10/325 mg #120--use 1 tablet every 6 hours as needed and MS Contin 30 mg one tablet every 12 hours as needed #60. 4. Depression: Continue with Lexapro. Continue to Monitor  5. Anxiety: Continue with Xanax. Continue to Monitor  6.Neuropathy: Continue  Pamelor and Continue To Monitor. 7. Constipation: Continue Senokot 8.. Myoclonus: Continue to Monitor 10.Muscle Spasm: Continue Baclofen  20 minutes of face to face patient care time was spent during this visit. All questions were encouraged and answered.

## 2015-10-30 ENCOUNTER — Other Ambulatory Visit: Payer: Self-pay | Admitting: Physical Medicine & Rehabilitation

## 2015-11-08 DIAGNOSIS — K219 Gastro-esophageal reflux disease without esophagitis: Secondary | ICD-10-CM | POA: Diagnosis not present

## 2015-11-08 DIAGNOSIS — M549 Dorsalgia, unspecified: Secondary | ICD-10-CM | POA: Diagnosis not present

## 2015-11-08 DIAGNOSIS — Z79899 Other long term (current) drug therapy: Secondary | ICD-10-CM | POA: Diagnosis not present

## 2015-11-08 DIAGNOSIS — M8589 Other specified disorders of bone density and structure, multiple sites: Secondary | ICD-10-CM | POA: Diagnosis not present

## 2015-11-08 DIAGNOSIS — G629 Polyneuropathy, unspecified: Secondary | ICD-10-CM | POA: Diagnosis not present

## 2015-11-08 DIAGNOSIS — E039 Hypothyroidism, unspecified: Secondary | ICD-10-CM | POA: Diagnosis not present

## 2015-11-08 DIAGNOSIS — I10 Essential (primary) hypertension: Secondary | ICD-10-CM | POA: Diagnosis not present

## 2015-11-08 DIAGNOSIS — Z780 Asymptomatic menopausal state: Secondary | ICD-10-CM | POA: Diagnosis not present

## 2015-11-22 ENCOUNTER — Encounter: Payer: Self-pay | Admitting: Registered Nurse

## 2015-11-22 ENCOUNTER — Encounter: Payer: Medicare Other | Attending: Physical Medicine and Rehabilitation | Admitting: Registered Nurse

## 2015-11-22 VITALS — BP 121/85 | HR 74 | Resp 14

## 2015-11-22 DIAGNOSIS — M62838 Other muscle spasm: Secondary | ICD-10-CM

## 2015-11-22 DIAGNOSIS — Z79899 Other long term (current) drug therapy: Secondary | ICD-10-CM | POA: Diagnosis not present

## 2015-11-22 DIAGNOSIS — M961 Postlaminectomy syndrome, not elsewhere classified: Secondary | ICD-10-CM | POA: Diagnosis not present

## 2015-11-22 DIAGNOSIS — G039 Meningitis, unspecified: Secondary | ICD-10-CM

## 2015-11-22 DIAGNOSIS — G894 Chronic pain syndrome: Secondary | ICD-10-CM

## 2015-11-22 DIAGNOSIS — M51369 Other intervertebral disc degeneration, lumbar region without mention of lumbar back pain or lower extremity pain: Secondary | ICD-10-CM

## 2015-11-22 DIAGNOSIS — Z5181 Encounter for therapeutic drug level monitoring: Secondary | ICD-10-CM | POA: Diagnosis not present

## 2015-11-22 DIAGNOSIS — M5136 Other intervertebral disc degeneration, lumbar region: Secondary | ICD-10-CM | POA: Diagnosis not present

## 2015-11-22 DIAGNOSIS — G834 Cauda equina syndrome: Secondary | ICD-10-CM | POA: Diagnosis not present

## 2015-11-22 MED ORDER — MORPHINE SULFATE ER 30 MG PO TBCR: 30.0000 mg | EXTENDED_RELEASE_TABLET | Freq: Two times a day (BID) | ORAL | Status: DC

## 2015-11-22 MED ORDER — OXYCODONE-ACETAMINOPHEN 10-325 MG PO TABS: 1.0000 | ORAL_TABLET | Freq: Four times a day (QID) | ORAL | Status: DC | PRN

## 2015-11-22 NOTE — Progress Notes (Signed)
Subjective:    Patient ID: Sherry Wong, female    DOB: [birth year], 60 y.o.   MRN: [id number]  HPI: [name] is a 60 year old female who returns for follow up for chronic pain and medication refill. She says her pain is located in her lower back radiating into her buttocks and lower extremities laterally and bilateral feet. She rates her pain 6.Her current exercise regime is walking and performing stretching exercises.   Pain Inventory Average Pain 7 Pain Right Now 6 My pain is sharp, burning, stabbing, tingling and aching  In the last 24 hours, has pain interfered with the following? General activity 8 Relation with others 8 Enjoyment of life 8 What TIME of day is your pain at its worst? morning, daytime, evening, night Sleep (in general) Fair  Pain is worse with: walking, bending, standing and some activites Pain improves with: rest, heat/ice and medication Relief from Meds: NA  Mobility walk with assistance use a cane how many minutes can you walk? 10-15 ability to climb steps?  yes do you drive?  yes Do you have any goals in this area?  yes  Function disabled: date disabled 04/30/2013 I need assistance with the following:  meal prep, household duties and shopping Do you have any goals in this area?  yes  Neuro/Psych bladder control problems bowel control problems weakness numbness tremor tingling trouble walking spasms depression anxiety  Prior Studies Any changes since last visit?  no bone scan x-rays CT/MRI  Physicians involved in your care Any changes since last visit?  no Primary care . Neurologist . Neurosurgeon .   Family History  Problem Relation Age of Onset  . Anesthesia problems Sister   . Hypotension Neg Hx   . Malignant hyperthermia Neg Hx   . Pseudochol deficiency Neg Hx   . Anesthesia problems Mother    Social History   Social History  . Marital Status: Married    Spouse Name: N/A  . Number of Children: 0    . Years of Education: 12   Occupational History  . Disabled    Social History Main Topics  . Smoking status: Never Smoker   . Smokeless tobacco: Never Used  . Alcohol Use: 0.0 oz/week    0 Standard drinks or equivalent per week     Comment: occ- 0.5 can beer every 2 months  . Drug Use: No  . Sexual Activity: Not Currently   Other Topics Concern  . None   Social History Narrative   Lives at home with husband.   Caffeine use: 1 cup coffee 3 times per week.    Past Surgical History  Procedure Laterality Date  . Cysts removed from ovary    . Cholecystectomy    . Dilation and curettage of uterus    . Breast surgery      left breast lumpectomy  . Breast biopsy      right  . Colonoscopy    . Esophagogastroduodenoscopy    . Lumbar laminectomy/decompression microdiscectomy  12/03/2011    Procedure: LUMBAR LAMINECTOMY/DECOMPRESSION MICRODISCECTOMY 2 LEVELS;  Surgeon: Floyce Stakes, MD;  Location: [place] [facility];  Service: Neurosurgery;  Laterality: Right;  Right Lumbar four-five Lumbar five sacral one Foraminotomies  . Transforaminal lumbar interbody fusion (tlif) with pedicle screw fixation 2 level N/A 08/25/2013    Procedure:  Lumbar four-five, Lumbar five-Sacral one Transforaminal Lumbar Interbody Fusion ;  Surgeon: Floyce Stakes, MD;  Location: [facility];  Service:  Neurosurgery;  Laterality: N/A;  . Hematoma evacuation N/A 08/25/2013    Procedure: EVACUATION OF LUMBAR HEMATOMA;  Surgeon: Floyce Stakes, MD;  Location: [place] [facility];  Service: Neurosurgery;  Laterality: N/A;  . Placement of lumbar drain N/A 09/01/2013    Procedure: PLACEMENT OF LUMBAR DRAIN;  Surgeon: Floyce Stakes, MD;  Location: [place] [facility];  Service: Neurosurgery;  Laterality: N/A;  . Lumbar wound debridement N/A 09/01/2013    Procedure: LUMBAR WOUND DEBRIDEMENT;  Surgeon: Floyce Stakes, MD;  Location: [facility];  Service: Neurosurgery;  Laterality: N/A;   Past Medical History  Diagnosis Date   . PONV (postoperative nausea and vomiting)   . Hypertension     takes Losartan daily  . Pneumonia     hx of 15+yrs ago  . Chronic back pain     ddd/lumbago,and radiculopathy  . Arthritis   . Gout     hx of  . GERD (gastroesophageal reflux disease)     takes Protonix daily  . H/O hiatal hernia   . Gastric ulcer   . Ulcerative colitis   . IBS (irritable bowel syndrome)   . Hx of colonic polyps   . Urinary incontinence   . Nocturia   . Hypothyroidism     takes Levothyroxine daily  . Depression   . Anxiety     takes celexa daily  . Asthma   . Bronchitis    BP 121/85 mmHg  Pulse 74  Resp 14  Opioid Risk Score:   Fall Risk Score:  `1  Depression screen PHQ 2/9  Depression screen [facility] 2/9 05/23/2015 04/25/2015 02/20/2015  Decreased Interest 1 1 2   Down, Depressed, Hopeless 1 1 3   PHQ - 2 Score 2 2 5   Altered sleeping - - 2  Tired, decreased energy - - 2  Change in appetite - - 0  Feeling bad or failure about yourself  - - 0  Trouble concentrating - - 0  Moving slowly or fidgety/restless - - 0  Suicidal thoughts - - 0  PHQ-9 Score - - 9     Review of Systems  Constitutional:       Bladder control problems Bowel control problems  Respiratory: Positive for shortness of breath and wheezing.   Gastrointestinal: Positive for nausea, abdominal pain, diarrhea and constipation.  Genitourinary: Positive for difficulty urinating.  Musculoskeletal: Positive for gait problem.  Neurological: Positive for tremors, weakness and numbness.       Tingling  Spasms   Psychiatric/Behavioral: Positive for dysphoric mood. The patient is nervous/anxious.   All other systems reviewed and are negative.      Objective:   Physical Exam  Constitutional: She is oriented to person, place, and time. She appears well-developed and well-nourished.  HENT:  Head: Normocephalic and atraumatic.  Neck: Normal range of motion. Neck supple.  Cardiovascular: Normal rate and regular rhythm.    Pulmonary/Chest: Effort normal and breath sounds normal.  Musculoskeletal:  Normal Muscle Bulk and Muscle Testing Reveals: Upper Extremities: Full ROM and Muscle Strength 5/5 Lumbar Hypersensitivity Lower Extremities: Full ROM and Muscle Strength 5/5 Right Lower Extremity Flexion Produces Muscle Spasm Bilateral Lower Extremities Flexion Produces pain into Lumbar and Bilateral Hips Arises from chair slowly using 4 prong cane for support Antalgic Gait  Neurological: She is alert and oriented to person, place, and time.  Skin: Skin is warm and dry.  Psychiatric: She has a normal mood and affect.  Nursing note and vitals reviewed.  Assessment & Plan:  1. Cauda equina syndrome: Incontinence: Wearing Depends:Continue to Monitor  2. Insomnia: Continue with Nortriptyline 3. 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  Refilled: Percocet 10/325 mg #120--use 1 tablet every 6 hours as needed and MS Contin 30 mg one tablet every 12 hours as needed #60. 4. Depression: Continue with Lexapro. Continue to Monitor  5. Anxiety: Continue with Xanax. Continue to Monitor  6.Neuropathy: Continue Pamelor and Continue To Monitor. 7. Constipation: Continue Senokot 8.. Myoclonus:  No tremors noted today. Continue to Monitor 10.Muscle Spasm: Continue Baclofen  20 minutes of face to face patient care time was spent during this visit. All questions were encouraged and answered.

## 2015-12-20 ENCOUNTER — Encounter: Payer: Self-pay | Admitting: Registered Nurse

## 2015-12-20 ENCOUNTER — Encounter: Payer: Medicare Other | Attending: Physical Medicine and Rehabilitation | Admitting: Registered Nurse

## 2015-12-20 VITALS — BP 104/71 | HR 80 | Resp 16

## 2015-12-20 DIAGNOSIS — Z5181 Encounter for therapeutic drug level monitoring: Secondary | ICD-10-CM | POA: Insufficient documentation

## 2015-12-20 DIAGNOSIS — G894 Chronic pain syndrome: Secondary | ICD-10-CM

## 2015-12-20 DIAGNOSIS — M961 Postlaminectomy syndrome, not elsewhere classified: Secondary | ICD-10-CM

## 2015-12-20 DIAGNOSIS — G834 Cauda equina syndrome: Secondary | ICD-10-CM | POA: Diagnosis not present

## 2015-12-20 DIAGNOSIS — G039 Meningitis, unspecified: Secondary | ICD-10-CM | POA: Diagnosis not present

## 2015-12-20 DIAGNOSIS — M62838 Other muscle spasm: Secondary | ICD-10-CM

## 2015-12-20 DIAGNOSIS — M5136 Other intervertebral disc degeneration, lumbar region: Secondary | ICD-10-CM | POA: Diagnosis not present

## 2015-12-20 DIAGNOSIS — Z79899 Other long term (current) drug therapy: Secondary | ICD-10-CM | POA: Insufficient documentation

## 2015-12-20 MED ORDER — MORPHINE SULFATE ER 30 MG PO TBCR: 30.0000 mg | EXTENDED_RELEASE_TABLET | Freq: Two times a day (BID) | ORAL | Status: DC

## 2015-12-20 MED ORDER — OXYCODONE-ACETAMINOPHEN 10-325 MG PO TABS: 1.0000 | ORAL_TABLET | Freq: Four times a day (QID) | ORAL | Status: DC | PRN

## 2015-12-20 NOTE — Progress Notes (Signed)
Subjective:    Patient ID: Sherry Wong, female    DOB: [birth year], 60 y.o.   MRN: [id number]  HPI:Sherry Wong is a 60 year old female who returns for follow up for chronic pain and medication refill. She states her pain is located in her lower back radiating into her buttocks and lower extremities laterally and bilateral feet. Also states she has noticed increase pain in her hands ( neuropathy). We discussed low dose gabapentin, she developed tremors with the higher doses, [name] will think about it and give me a call. Also encouraged to follow up with her neurologist. She verbalizes understanding.She rates her pain 6.Her current exercise regime is walking and performing stretching exercises.   Pain Inventory Average Pain 7 Pain Right Now 6 My pain is sharp, burning, dull, stabbing, tingling and aching  In the last 24 hours, has pain interfered with the following? General activity 7 Relation with others 7 Enjoyment of life 7 What TIME of day is your pain at its worst? all Sleep (in general) Fair  Pain is worse with: walking, bending, standing and some activites Pain improves with: rest, heat/ice and medication Relief from Meds: 5  Mobility walk with assistance use a cane how many minutes can you walk? 10 ability to climb steps?  yes do you drive?  yes  Function disabled: date disabled 11/29/2011 I need assistance with the following:  meal prep, household duties and shopping  Neuro/Psych bladder control problems bowel control problems weakness numbness tremor tingling trouble walking spasms depression anxiety  Prior Studies Any changes since last visit?  no  Physicians involved in your care Any changes since last visit?  no   Family History  Problem Relation Age of Onset  . Anesthesia problems Sister   . Hypotension Neg Hx   . Malignant hyperthermia Neg Hx   . Pseudochol deficiency Neg Hx   . Anesthesia problems Mother    Social History     Social History  . Marital Status: Married    Spouse Name: N/A  . Number of Children: 0  . Years of Education: 12   Occupational History  . Disabled    Social History Main Topics  . Smoking status: Never Smoker   . Smokeless tobacco: Never Used  . Alcohol Use: 0.0 oz/week    0 Standard drinks or equivalent per week     Comment: occ- 0.5 can beer every 2 months  . Drug Use: No  . Sexual Activity: Not Currently   Other Topics Concern  . None   Social History Narrative   Lives at home with husband.   Caffeine use: 1 cup coffee 3 times per week.    Past Surgical History  Procedure Laterality Date  . Cysts removed from ovary    . Cholecystectomy    . Dilation and curettage of uterus    . Breast surgery      left breast lumpectomy  . Breast biopsy      right  . Colonoscopy    . Esophagogastroduodenoscopy    . Lumbar laminectomy/decompression microdiscectomy  12/03/2011    Procedure: LUMBAR LAMINECTOMY/DECOMPRESSION MICRODISCECTOMY 2 LEVELS;  Surgeon: Floyce Stakes, MD;  Location: [place] [facility];  Service: Neurosurgery;  Laterality: Right;  Right Lumbar four-five Lumbar five sacral one Foraminotomies  . Transforaminal lumbar interbody fusion (tlif) with pedicle screw fixation 2 level N/A 08/25/2013    Procedure:  Lumbar four-five, Lumbar five-Sacral one Transforaminal Lumbar Interbody Fusion ;  Surgeon:  Floyce Stakes, MD;  Location: [place] [facility];  Service: Neurosurgery;  Laterality: N/A;  . Hematoma evacuation N/A 08/25/2013    Procedure: EVACUATION OF LUMBAR HEMATOMA;  Surgeon: Floyce Stakes, MD;  Location: [place] [facility];  Service: Neurosurgery;  Laterality: N/A;  . Placement of lumbar drain N/A 09/01/2013    Procedure: PLACEMENT OF LUMBAR DRAIN;  Surgeon: Floyce Stakes, MD;  Location: [place] [facility];  Service: Neurosurgery;  Laterality: N/A;  . Lumbar wound debridement N/A 09/01/2013    Procedure: LUMBAR WOUND DEBRIDEMENT;  Surgeon: Floyce Stakes, MD;  Location: [facility];  Service: Neurosurgery;  Laterality: N/A;   Past Medical History  Diagnosis Date  . PONV (postoperative nausea and vomiting)   . Hypertension     takes Losartan daily  . Pneumonia     hx of 15+yrs ago  . Chronic back pain     ddd/lumbago,and radiculopathy  . Arthritis   . Gout     hx of  . GERD (gastroesophageal reflux disease)     takes Protonix daily  . H/O hiatal hernia   . Gastric ulcer   . Ulcerative colitis   . IBS (irritable bowel syndrome)   . Hx of colonic polyps   . Urinary incontinence   . Nocturia   . Hypothyroidism     takes Levothyroxine daily  . Depression   . Anxiety     takes celexa daily  . Asthma   . Bronchitis    BP 104/71 mmHg  Pulse 80  Resp 16  Opioid Risk Score:   Fall Risk Score:  `1  Depression screen PHQ 2/9  Depression screen [facility] 2/9 12/20/2015 05/23/2015 04/25/2015 02/20/2015  Decreased Interest 1 1 1 2   Down, Depressed, Hopeless 1 1 1 3   PHQ - 2 Score 2 2 2 5   Altered sleeping - - - 2  Tired, decreased energy - - - 2  Change in appetite - - - 0  Feeling bad or failure about yourself  - - - 0  Trouble concentrating - - - 0  Moving slowly or fidgety/restless - - - 0  Suicidal thoughts - - - 0  PHQ-9 Score - - - 9     Review of Systems  Constitutional: Positive for diaphoresis and unexpected weight change.  Gastrointestinal: Positive for nausea and constipation.  Genitourinary: Positive for difficulty urinating.  All other systems reviewed and are negative.      Objective:   Physical Exam  Constitutional: She is oriented to person, place, and time. She appears well-developed and well-nourished.  HENT:  Head: Normocephalic and atraumatic.  Neck: Normal range of motion. Neck supple.  Cardiovascular: Normal rate and regular rhythm.   Pulmonary/Chest: Effort normal and breath sounds normal.  Musculoskeletal:  Normal Muscle Bulk and Muscle Testing Reveals: Upper Extremities: Full ROM and Muscle Strength 5/5 Lumbar  Paraspinal Tenderness: L-3-L-5 Right Greater Trochanteric tenderness Lower Extremities: Full ROM and Muscle Strength 5/5 Right: Lower Extremity Extension Produces Muscle Spasm Arises from chair slowly using 4 prong cane for support Antalgic Gait  Neurological: She is alert and oriented to person, place, and time.  Skin: Skin is warm and dry.  Psychiatric: She has a normal mood and affect.  Nursing note and vitals reviewed.         Assessment & Plan:  1. Cauda equina syndrome: Incontinence: Wearing Depends:Continue to Monitor  2. Insomnia: Continue with Nortriptyline 3. DDD L4-S1 s/p diskectomy with decompression of thecal sac: She  continues to have back pain.  Refilled: Percocet 10/325 mg #120--use 1 tablet every 6 hours as needed and MS Contin 30 mg one tablet every 12 hours as needed #60. Second script given to accommodate scheduled appointment. 4. Depression: Continue with Lexapro. Continue to Monitor  5. Anxiety: Continue with Xanax. Continue to Monitor  6.Neuropathy: Continue Pamelor and Continue To Monitor. 7. Constipation: Continue Senokot 8.. Myoclonus: No tremors noted today. Continue to Monitor 10.Muscle Spasm: Continue Baclofen  20 minutes of face to face patient care time was spent during this visit. All questions were encouraged and answered.

## 2015-12-24 LAB — TOXASSURE SELECT,+ANTIDEPR,UR: PDF: 0

## 2015-12-24 LAB — 6-ACETYLMORPHINE,TOXASSURE ADD
6-ACETYLMORPHINE: NEGATIVE
6-acetylmorphine: NOT DETECTED ng/mg creat

## 2015-12-25 NOTE — Progress Notes (Signed)
Urine drug screen for this encounter is consistent for prescribed medication 

## 2016-01-24 ENCOUNTER — Encounter: Payer: Medicare Other | Attending: Physical Medicine and Rehabilitation | Admitting: Registered Nurse

## 2016-01-24 ENCOUNTER — Encounter: Payer: Self-pay | Admitting: Registered Nurse

## 2016-01-24 VITALS — BP 122/66 | HR 70 | Resp 14

## 2016-01-24 DIAGNOSIS — G039 Meningitis, unspecified: Secondary | ICD-10-CM | POA: Insufficient documentation

## 2016-01-24 DIAGNOSIS — M62838 Other muscle spasm: Secondary | ICD-10-CM

## 2016-01-24 DIAGNOSIS — M961 Postlaminectomy syndrome, not elsewhere classified: Secondary | ICD-10-CM | POA: Diagnosis not present

## 2016-01-24 DIAGNOSIS — Z79899 Other long term (current) drug therapy: Secondary | ICD-10-CM | POA: Insufficient documentation

## 2016-01-24 DIAGNOSIS — Z5181 Encounter for therapeutic drug level monitoring: Secondary | ICD-10-CM | POA: Insufficient documentation

## 2016-01-24 DIAGNOSIS — G834 Cauda equina syndrome: Secondary | ICD-10-CM | POA: Insufficient documentation

## 2016-01-24 DIAGNOSIS — M5136 Other intervertebral disc degeneration, lumbar region: Secondary | ICD-10-CM | POA: Insufficient documentation

## 2016-01-24 DIAGNOSIS — G894 Chronic pain syndrome: Secondary | ICD-10-CM

## 2016-01-24 MED ORDER — ALPRAZOLAM 0.25 MG PO TABS: 0.2500 mg | ORAL_TABLET | Freq: Two times a day (BID) | ORAL | Status: DC | PRN

## 2016-01-24 NOTE — Progress Notes (Signed)
Subjective:    Patient ID: Sherry Wong, female    DOB: 1956-04-28, 60 y.o.   MRN: [id number]  HPI: [name] is a 60 year old female who returns for follow up for chronic pain and medication refill. She states her pain is located in her lower back radiating into her buttocks and lower extremities laterally and bilateral feet. She rates her pain 6.Her current exercise regime is walking and performing stretching exercises and using her bands.  Also states on 12/22/2015 she was walking in her yard when she lost her balance she stated she had her cane, she landed on her knees. Her husband helped her up, she didn't seek medical attention.  Pain Inventory Average Pain 7 Pain Right Now 6 My pain is sharp, burning, stabbing, tingling and aching  In the last 24 hours, has pain interfered with the following? General activity 7 Relation with others 7 Enjoyment of life 7 What TIME of day is your pain at its worst? all Sleep (in general) Fair  Pain is worse with: walking, bending, standing and some activites Pain improves with: rest, heat/ice and medication Relief from Meds: 4  Mobility walk with assistance use a cane how many minutes can you walk? 10-15 ability to climb steps?  yes do you drive?  yes Do you have any goals in this area?  yes  Function disabled: date disabled 11/29/2011 I need assistance with the following:  meal prep, household duties and shopping Do you have any goals in this area?  yes  Neuro/Psych bladder control problems bowel control problems weakness numbness tingling trouble walking spasms depression anxiety  Prior Studies Any changes since last visit?  no  Physicians involved in your care Any changes since last visit?  no   Family History  Problem Relation Age of Onset  . Anesthesia problems Sister   . Hypotension Neg Hx   . Malignant hyperthermia Neg Hx   . Pseudochol deficiency Neg Hx   . Anesthesia problems Mother    Social  History   Social History  . Marital Status: Married    Spouse Name: N/A  . Number of Children: 0  . Years of Education: 12   Occupational History  . Disabled    Social History Main Topics  . Smoking status: Never Smoker   . Smokeless tobacco: Never Used  . Alcohol Use: 0.0 oz/week    0 Standard drinks or equivalent per week     Comment: occ- 0.5 can beer every 2 months  . Drug Use: No  . Sexual Activity: Not Currently   Other Topics Concern  . None   Social History Narrative   Lives at home with husband.   Caffeine use: 1 cup coffee 3 times per week.    Past Surgical History  Procedure Laterality Date  . Cysts removed from ovary    . Cholecystectomy    . Dilation and curettage of uterus    . Breast surgery      left breast lumpectomy  . Breast biopsy      right  . Colonoscopy    . Esophagogastroduodenoscopy    . Lumbar laminectomy/decompression microdiscectomy  12/03/2011    Procedure: LUMBAR LAMINECTOMY/DECOMPRESSION MICRODISCECTOMY 2 LEVELS;  Surgeon: Floyce Stakes, MD;  Location: [place] [facility];  Service: Neurosurgery;  Laterality: Right;  Right Lumbar four-five Lumbar five sacral one Foraminotomies  . Transforaminal lumbar interbody fusion (tlif) with pedicle screw fixation 2 level N/A 08/25/2013    Procedure:  Lumbar four-five, Lumbar five-Sacral one Transforaminal Lumbar Interbody Fusion ;  Surgeon: Floyce Stakes, MD;  Location: [facility];  Service: Neurosurgery;  Laterality: N/A;  . Hematoma evacuation N/A 08/25/2013    Procedure: EVACUATION OF LUMBAR HEMATOMA;  Surgeon: Floyce Stakes, MD;  Location: [place] [facility];  Service: Neurosurgery;  Laterality: N/A;  . Placement of lumbar drain N/A 09/01/2013    Procedure: PLACEMENT OF LUMBAR DRAIN;  Surgeon: Floyce Stakes, MD;  Location: [place] [facility];  Service: Neurosurgery;  Laterality: N/A;  . Lumbar wound debridement N/A 09/01/2013    Procedure: LUMBAR WOUND DEBRIDEMENT;  Surgeon: Floyce Stakes, MD;   Location: [facility];  Service: Neurosurgery;  Laterality: N/A;   Past Medical History  Diagnosis Date  . PONV (postoperative nausea and vomiting)   . Hypertension     takes Losartan daily  . Pneumonia     hx of 15+yrs ago  . Chronic back pain     ddd/lumbago,and radiculopathy  . Arthritis   . Gout     hx of  . GERD (gastroesophageal reflux disease)     takes Protonix daily  . H/O hiatal hernia   . Gastric ulcer   . Ulcerative colitis   . IBS (irritable bowel syndrome)   . Hx of colonic polyps   . Urinary incontinence   . Nocturia   . Hypothyroidism     takes Levothyroxine daily  . Depression   . Anxiety     takes celexa daily  . Asthma   . Bronchitis    BP 122/66 mmHg  Pulse 70  Resp 14  SpO2 95%  Opioid Risk Score:   Fall Risk Score:  `1  Depression screen PHQ 2/9  Depression screen [facility] 2/9 12/20/2015 05/23/2015 04/25/2015 02/20/2015  Decreased Interest 1 1 1 2   Down, Depressed, Hopeless 1 1 1 3   PHQ - 2 Score 2 2 2 5   Altered sleeping - - - 2  Tired, decreased energy - - - 2  Change in appetite - - - 0  Feeling bad or failure about yourself  - - - 0  Trouble concentrating - - - 0  Moving slowly or fidgety/restless - - - 0  Suicidal thoughts - - - 0  PHQ-9 Score - - - 9      Review of Systems  Gastrointestinal: Positive for constipation.  Genitourinary: Positive for difficulty urinating.  All other systems reviewed and are negative.      Objective:   Physical Exam  Constitutional: She is oriented to person, place, and time. She appears well-developed and well-nourished.  HENT:  Head: Normocephalic and atraumatic.  Neck: Normal range of motion. Neck supple.  Cardiovascular: Normal rate and regular rhythm.   Pulmonary/Chest: Effort normal and breath sounds normal.  Musculoskeletal:  Normal Muscle Bulk and Muscle Testing Reveals: Upper Extremities: Full ROM and Muscle Strength 5/5 Lumbar Hypersensitivity Lower Extremities: Full ROM and Muscle  Strength 5/5 Arises from chair slowly using 4 prong cane for support Narrow Based gait   Neurological: She is alert and oriented to person, place, and time.  Skin: Skin is warm and dry.  Psychiatric: She has a normal mood and affect.  Nursing note and vitals reviewed.         Assessment & Plan:  1. Cauda equina syndrome: Incontinence: Wearing Depends:Continue to Monitor  2. Insomnia: Continue with Nortriptyline 3. 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  Refilled: Percocet 10/325 mg #120--use 1 tablet every 6 hours as needed and MS Contin 30 mg one tablet every 12 hours as needed #60.  We will continue the opioid monitoring program, this consists of regular clinic visits, examinations, urine drug screen, pill counts as well as use of [place] Controlled Substance Reporting System.. 4. Depression: Continue with Lexapro. Continue to Monitor  5. Anxiety: Continue with Xanax. Continue to Monitor  6.Neuropathy: Continue Pamelor and Continue To Monitor. 7. Constipation: Continue Senokot 8.Muscle Spasm: Continue Baclofen  20 minutes of face to face patient care time was spent during this visit. All questions were encouraged and answered.

## 2016-01-29 ENCOUNTER — Other Ambulatory Visit: Payer: Self-pay | Admitting: Physical Medicine & Rehabilitation

## 2016-02-21 ENCOUNTER — Encounter: Payer: Self-pay | Admitting: Registered Nurse

## 2016-02-21 ENCOUNTER — Encounter: Payer: Medicare Other | Attending: Physical Medicine and Rehabilitation | Admitting: Registered Nurse

## 2016-02-21 VITALS — BP 109/80 | HR 80 | Resp 14

## 2016-02-21 DIAGNOSIS — G894 Chronic pain syndrome: Secondary | ICD-10-CM

## 2016-02-21 DIAGNOSIS — Z5181 Encounter for therapeutic drug level monitoring: Secondary | ICD-10-CM | POA: Insufficient documentation

## 2016-02-21 DIAGNOSIS — M961 Postlaminectomy syndrome, not elsewhere classified: Secondary | ICD-10-CM | POA: Diagnosis not present

## 2016-02-21 DIAGNOSIS — G039 Meningitis, unspecified: Secondary | ICD-10-CM

## 2016-02-21 DIAGNOSIS — Z79899 Other long term (current) drug therapy: Secondary | ICD-10-CM | POA: Diagnosis not present

## 2016-02-21 DIAGNOSIS — G834 Cauda equina syndrome: Secondary | ICD-10-CM

## 2016-02-21 DIAGNOSIS — M5136 Other intervertebral disc degeneration, lumbar region: Secondary | ICD-10-CM | POA: Diagnosis not present

## 2016-02-21 DIAGNOSIS — M62838 Other muscle spasm: Secondary | ICD-10-CM | POA: Diagnosis not present

## 2016-02-21 MED ORDER — OXYCODONE-ACETAMINOPHEN 10-325 MG PO TABS: 1.0000 | ORAL_TABLET | Freq: Four times a day (QID) | ORAL | Status: DC | PRN

## 2016-02-21 MED ORDER — MORPHINE SULFATE ER 30 MG PO TBCR: 30.0000 mg | EXTENDED_RELEASE_TABLET | Freq: Two times a day (BID) | ORAL | Status: DC

## 2016-02-21 NOTE — Progress Notes (Signed)
Subjective:    Patient ID: Sherry Wong, female    DOB: 02-Jul-1956, 60 y.o.   MRN: [id number]  HPI: [name] is a 60 year old female who returns for follow up for chronic pain and medication refill. She states her pain is located in her lower back radiating into her buttocks and lower extremities laterally and bilateral feet. She rates her pain 5.Her current exercise regime is walking and performing stretching exercises and using her bands.   Pain Inventory Average Pain 7 Pain Right Now 5 My pain is sharp, burning, stabbing, tingling and aching  In the last 24 hours, has pain interfered with the following? General activity 8 Relation with others 8 Enjoyment of life 8 What TIME of day is your pain at its worst? all Sleep (in general) Fair  Pain is worse with: walking, bending, sitting, standing and some activites Pain improves with: rest, heat/ice and medication Relief from Meds: 4  Mobility walk with assistance use a cane how many minutes can you walk? 10 ability to climb steps?  yes do you drive?  yes Do you have any goals in this area?  yes  Function not employed: date last employed 12/03/2011 disabled: date disabled 04/30/2013 I need assistance with the following:  meal prep, household duties and shopping Do you have any goals in this area?  yes  Neuro/Psych bladder control problems bowel control problems numbness tremor tingling trouble walking spasms depression anxiety  Prior Studies Any changes since last visit?  no  Physicians involved in your care Any changes since last visit?  no   Family History  Problem Relation Age of Onset  . Anesthesia problems Sister   . Hypotension Neg Hx   . Malignant hyperthermia Neg Hx   . Pseudochol deficiency Neg Hx   . Anesthesia problems Mother    Social History   Social History  . Marital Status: Married    Spouse Name: N/A  . Number of Children: 0  . Years of Education: 12   Occupational  History  . Disabled    Social History Main Topics  . Smoking status: Never Smoker   . Smokeless tobacco: Never Used  . Alcohol Use: 0.0 oz/week    0 Standard drinks or equivalent per week     Comment: occ- 0.5 can beer every 2 months  . Drug Use: No  . Sexual Activity: Not Currently   Other Topics Concern  . None   Social History Narrative   Lives at home with husband.   Caffeine use: 1 cup coffee 3 times per week.    Past Surgical History  Procedure Laterality Date  . Cysts removed from ovary    . Cholecystectomy    . Dilation and curettage of uterus    . Breast surgery      left breast lumpectomy  . Breast biopsy      right  . Colonoscopy    . Esophagogastroduodenoscopy    . Lumbar laminectomy/decompression microdiscectomy  12/03/2011    Procedure: LUMBAR LAMINECTOMY/DECOMPRESSION MICRODISCECTOMY 2 LEVELS;  Surgeon: Floyce Stakes, MD;  Location: [place] [facility];  Service: Neurosurgery;  Laterality: Right;  Right Lumbar four-five Lumbar five sacral one Foraminotomies  . Transforaminal lumbar interbody fusion (tlif) with pedicle screw fixation 2 level N/A 08/25/2013    Procedure:  Lumbar four-five, Lumbar five-Sacral one Transforaminal Lumbar Interbody Fusion ;  Surgeon: Floyce Stakes, MD;  Location: [place] [facility];  Service: Neurosurgery;  Laterality: N/A;  .  Hematoma evacuation N/A 08/25/2013    Procedure: EVACUATION OF LUMBAR HEMATOMA;  Surgeon: Floyce Stakes, MD;  Location: [place] [facility];  Service: Neurosurgery;  Laterality: N/A;  . Placement of lumbar drain N/A 09/01/2013    Procedure: PLACEMENT OF LUMBAR DRAIN;  Surgeon: Floyce Stakes, MD;  Location: [place] [facility];  Service: Neurosurgery;  Laterality: N/A;  . Lumbar wound debridement N/A 09/01/2013    Procedure: LUMBAR WOUND DEBRIDEMENT;  Surgeon: Floyce Stakes, MD;  Location: [facility];  Service: Neurosurgery;  Laterality: N/A;   Past Medical History  Diagnosis Date  . PONV (postoperative nausea and vomiting)    . Hypertension     takes Losartan daily  . Pneumonia     hx of 15+yrs ago  . Chronic back pain     ddd/lumbago,and radiculopathy  . Arthritis   . Gout     hx of  . GERD (gastroesophageal reflux disease)     takes Protonix daily  . H/O hiatal hernia   . Gastric ulcer   . Ulcerative colitis   . IBS (irritable bowel syndrome)   . Hx of colonic polyps   . Urinary incontinence   . Nocturia   . Hypothyroidism     takes Levothyroxine daily  . Depression   . Anxiety     takes celexa daily  . Asthma   . Bronchitis    BP 109/80 mmHg  Pulse 80  Resp 14  SpO2 94%  Opioid Risk Score:   Fall Risk Score:  `1  Depression screen PHQ 2/9  Depression screen [facility] 2/9 12/20/2015 05/23/2015 04/25/2015 02/20/2015  Decreased Interest 1 1 1 2   Down, Depressed, Hopeless 1 1 1 3   PHQ - 2 Score 2 2 2 5   Altered sleeping - - - 2  Tired, decreased energy - - - 2  Change in appetite - - - 0  Feeling bad or failure about yourself  - - - 0  Trouble concentrating - - - 0  Moving slowly or fidgety/restless - - - 0  Suicidal thoughts - - - 0  PHQ-9 Score - - - 9     Review of Systems  Gastrointestinal: Positive for constipation.  Endocrine:       High blood sugar  Genitourinary: Positive for difficulty urinating.       Retention  All other systems reviewed and are negative.      Objective:   Physical Exam  Constitutional: She is oriented to person, place, and time. She appears well-developed and well-nourished.  HENT:  Head: Normocephalic and atraumatic.  Neck: Normal range of motion. Neck supple.  Cardiovascular: Normal rate and regular rhythm.   Pulmonary/Chest: Effort normal and breath sounds normal.  Musculoskeletal:  Normal Muscle Bulk and Muscle Testing Reveals: Upper Extremities: Full ROM and Muscle Strength 5/5 Lumbar Paraspinal Tenderness: L-3- L-5 Lower Extremities: Decreased ROM and Muscle Strength 5/5 Bilateral Lower Extremities Flexion Produces Pain into  Extremities Arises from chair slowly using 4 prong cane for support Narrow Based Gait  Neurological: She is alert and oriented to person, place, and time.  Skin: Skin is warm and dry.  Psychiatric: She has a normal mood and affect.  Nursing note and vitals reviewed.         Assessment & Plan:  1. Cauda equina syndrome: Incontinence: Wearing Depends:Continue to Monitor  2. Insomnia: Continue with Nortriptyline 3. 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  Refilled: Percocet  10/325 mg #120--use 1 tablet every 6 hours as needed and MS Contin 30 mg one tablet every 12 hours as needed #60.  We will continue the opioid monitoring program, this consists of regular clinic visits, examinations, urine drug screen, pill counts as well as use of [place] Controlled Substance Reporting System.. 4. Depression: Continue with Lexapro. Continue to Monitor  5. Anxiety: Continue with Xanax. Continue to Monitor  6.Neuropathy: Continue Pamelor and Continue To Monitor. 7. Constipation: Continue Senokot 8.Muscle Spasm: Continue Baclofen  20 minutes of face to face patient care time was spent during this visit. All questions were encouraged and answered.

## 2016-02-23 ENCOUNTER — Other Ambulatory Visit: Payer: Self-pay | Admitting: Registered Nurse

## 2016-03-01 DIAGNOSIS — L0390 Cellulitis, unspecified: Secondary | ICD-10-CM | POA: Diagnosis not present

## 2016-03-12 DIAGNOSIS — E039 Hypothyroidism, unspecified: Secondary | ICD-10-CM | POA: Diagnosis not present

## 2016-03-12 DIAGNOSIS — M4800 Spinal stenosis, site unspecified: Secondary | ICD-10-CM | POA: Diagnosis not present

## 2016-03-12 DIAGNOSIS — M545 Low back pain: Secondary | ICD-10-CM | POA: Diagnosis not present

## 2016-03-12 DIAGNOSIS — I10 Essential (primary) hypertension: Secondary | ICD-10-CM | POA: Diagnosis not present

## 2016-03-20 ENCOUNTER — Encounter: Payer: Self-pay | Admitting: Registered Nurse

## 2016-03-20 ENCOUNTER — Encounter: Payer: Medicare Other | Attending: Physical Medicine and Rehabilitation | Admitting: Registered Nurse

## 2016-03-20 VITALS — BP 119/84 | HR 71 | Resp 14

## 2016-03-20 DIAGNOSIS — M62838 Other muscle spasm: Secondary | ICD-10-CM | POA: Diagnosis not present

## 2016-03-20 DIAGNOSIS — G834 Cauda equina syndrome: Secondary | ICD-10-CM | POA: Diagnosis not present

## 2016-03-20 DIAGNOSIS — M5136 Other intervertebral disc degeneration, lumbar region: Secondary | ICD-10-CM | POA: Insufficient documentation

## 2016-03-20 DIAGNOSIS — M961 Postlaminectomy syndrome, not elsewhere classified: Secondary | ICD-10-CM | POA: Diagnosis not present

## 2016-03-20 DIAGNOSIS — G894 Chronic pain syndrome: Secondary | ICD-10-CM

## 2016-03-20 DIAGNOSIS — Z5181 Encounter for therapeutic drug level monitoring: Secondary | ICD-10-CM | POA: Insufficient documentation

## 2016-03-20 DIAGNOSIS — G252 Other specified forms of tremor: Secondary | ICD-10-CM

## 2016-03-20 DIAGNOSIS — Z79899 Other long term (current) drug therapy: Secondary | ICD-10-CM | POA: Diagnosis not present

## 2016-03-20 DIAGNOSIS — G039 Meningitis, unspecified: Secondary | ICD-10-CM | POA: Diagnosis not present

## 2016-03-20 MED ORDER — OXYCODONE-ACETAMINOPHEN 10-325 MG PO TABS: 1.0000 | ORAL_TABLET | Freq: Four times a day (QID) | ORAL | Status: DC | PRN

## 2016-03-20 MED ORDER — MORPHINE SULFATE ER 30 MG PO TBCR: 30.0000 mg | EXTENDED_RELEASE_TABLET | Freq: Two times a day (BID) | ORAL | Status: DC

## 2016-03-20 NOTE — Progress Notes (Signed)
Subjective:    Patient ID: Sherry Wong, female    DOB: 1956/05/17, 60 y.o.   MRN: [id number]  HPI: [name] is a 60 year old female who returns for follow up for chronic pain and medication refill. She states her pain is located in her lower back radiating into her buttocks and lower extremities laterally and bilateral feet. She rates her pain 5.Her current exercise regime is walking and performing stretching exercises and using her bands.  Also states she has noticed occasional tremor, resting tremor noted. No Falls noted, falls prevention reviewed. She uses her 4 prong cane at all times.  Pain Inventory Average Pain 7 Pain Right Now 5 My pain is sharp, burning, dull, stabbing, tingling and aching  In the last 24 hours, has pain interfered with the following? General activity 8 Relation with others 8 Enjoyment of life 8 What TIME of day is your pain at its worst? all Sleep (in general) Fair  Pain is worse with: walking, bending, standing and some activites Pain improves with: rest and medication Relief from Meds: 5  Mobility walk without assistance use a cane how many minutes can you walk? 10 ability to climb steps?  yes do you drive?  yes Do you have any goals in this area?  yes  Function disabled: date disabled 2013 I need assistance with the following:  meal prep, household duties and shopping Do you have any goals in this area?  yes  Neuro/Psych bladder control problems bowel control problems weakness numbness tremor tingling trouble walking spasms depression anxiety  Prior Studies Any changes since last visit?  no  Physicians involved in your care Any changes since last visit?  no   Family History  Problem Relation Age of Onset  . Anesthesia problems Sister   . Hypotension Neg Hx   . Malignant hyperthermia Neg Hx   . Pseudochol deficiency Neg Hx   . Anesthesia problems Mother    Social History   Social History  . Marital  Status: Married    Spouse Name: N/A  . Number of Children: 0  . Years of Education: 12   Occupational History  . Disabled    Social History Main Topics  . Smoking status: Never Smoker   . Smokeless tobacco: Never Used  . Alcohol Use: 0.0 oz/week    0 Standard drinks or equivalent per week     Comment: occ- 0.5 can beer every 2 months  . Drug Use: No  . Sexual Activity: Not Currently   Other Topics Concern  . None   Social History Narrative   Lives at home with husband.   Caffeine use: 1 cup coffee 3 times per week.    Past Surgical History  Procedure Laterality Date  . Cysts removed from ovary    . Cholecystectomy    . Dilation and curettage of uterus    . Breast surgery      left breast lumpectomy  . Breast biopsy      right  . Colonoscopy    . Esophagogastroduodenoscopy    . Lumbar laminectomy/decompression microdiscectomy  12/03/2011    Procedure: LUMBAR LAMINECTOMY/DECOMPRESSION MICRODISCECTOMY 2 LEVELS;  Surgeon: Floyce Stakes, MD;  Location: [place] [facility];  Service: Neurosurgery;  Laterality: Right;  Right Lumbar four-five Lumbar five sacral one Foraminotomies  . Transforaminal lumbar interbody fusion (tlif) with pedicle screw fixation 2 level N/A 08/25/2013    Procedure:  Lumbar four-five, Lumbar five-Sacral one Transforaminal Lumbar Interbody Fusion ;  Surgeon: Floyce Stakes, MD;  Location: [facility];  Service: Neurosurgery;  Laterality: N/A;  . Hematoma evacuation N/A 08/25/2013    Procedure: EVACUATION OF LUMBAR HEMATOMA;  Surgeon: Floyce Stakes, MD;  Location: [place] [facility];  Service: Neurosurgery;  Laterality: N/A;  . Placement of lumbar drain N/A 09/01/2013    Procedure: PLACEMENT OF LUMBAR DRAIN;  Surgeon: Floyce Stakes, MD;  Location: [place] [facility];  Service: Neurosurgery;  Laterality: N/A;  . Lumbar wound debridement N/A 09/01/2013    Procedure: LUMBAR WOUND DEBRIDEMENT;  Surgeon: Floyce Stakes, MD;  Location: [facility];  Service:  Neurosurgery;  Laterality: N/A;   Past Medical History  Diagnosis Date  . PONV (postoperative nausea and vomiting)   . Hypertension     takes Losartan daily  . Pneumonia     hx of 15+yrs ago  . Chronic back pain     ddd/lumbago,and radiculopathy  . Arthritis   . Gout     hx of  . GERD (gastroesophageal reflux disease)     takes Protonix daily  . H/O hiatal hernia   . Gastric ulcer   . Ulcerative colitis   . IBS (irritable bowel syndrome)   . Hx of colonic polyps   . Urinary incontinence   . Nocturia   . Hypothyroidism     takes Levothyroxine daily  . Depression   . Anxiety     takes celexa daily  . Asthma   . Bronchitis    BP 119/84 mmHg  Pulse 71  Resp 14  SpO2 94%  Opioid Risk Score:   Fall Risk Score:  `1  Depression screen PHQ 2/9  Depression screen [facility] 2/9 03/20/2016 12/20/2015 05/23/2015 04/25/2015 02/20/2015  Decreased Interest 0 1 1 1 2   Down, Depressed, Hopeless 0 1 1 1 3   PHQ - 2 Score 0 2 2 2 5   Altered sleeping - - - - 2  Tired, decreased energy - - - - 2  Change in appetite - - - - 0  Feeling bad or failure about yourself  - - - - 0  Trouble concentrating - - - - 0  Moving slowly or fidgety/restless - - - - 0  Suicidal thoughts - - - - 0  PHQ-9 Score - - - - 9       Review of Systems  Constitutional: Negative.   HENT: Negative.   Respiratory: Negative.   Cardiovascular: Negative.   Gastrointestinal: Negative.   Genitourinary: Positive for difficulty urinating.  Musculoskeletal: Negative.   All other systems reviewed and are negative.      Objective:   Physical Exam  Constitutional: She is oriented to person, place, and time. She appears well-developed and well-nourished.  HENT:  Head: Normocephalic and atraumatic.  Neck: Normal range of motion. Neck supple.  Cardiovascular: Normal rate and regular rhythm.   Pulmonary/Chest: Effort normal and breath sounds normal.  Musculoskeletal:  Normal Muscle Bulk and Muscle Testing  Reveals: Upper Extremities: Full ROM and Muscle Strength 5/5 Lumbar Paraspinal Tenderness: L-4- L-6 Mainly Left Side Sacral Tenderness Lower Extremities: Right Decreased ROM: ROM extension produces Muscle Spasm Left: Full ROM and Muscle Strength 5/5 Arises from chair slowly using 4 prong cane for support Narrow Based Gait  Neurological: She is alert and oriented to person, place, and time.  Skin: Skin is warm and dry.  Psychiatric: She has a normal mood and affect.  Nursing note and vitals reviewed.  Assessment & Plan:  1. Cauda equina syndrome: Incontinence: Wearing Depends:Continue to Monitor  2. Insomnia: Continue with Nortriptyline 3. 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  Refilled: Percocet 10/325 mg #120--use 1 tablet every 6 hours as needed  and MS Contin 30 mg one tablet every 12 hours as needed #60.  We will continue the opioid monitoring program, this consists of regular clinic visits, examinations, urine drug screen, pill counts as well as use of [place] Controlled Substance Reporting System.. 4. Depression: Continue with Lexapro. Continue to Monitor  5. Anxiety: Continue with Xanax. Continue to Monitor  6.Neuropathy: Continue Pamelor and Continue To Monitor. 7. Constipation: Continue Senokot 8.Muscle Spasm: Continue Baclofen  20 minutes of face to face patient care time was spent during this visit. All questions were encouraged and answered.

## 2016-03-27 LAB — 6-ACETYLMORPHINE,TOXASSURE ADD
6-ACETYLMORPHINE: NEGATIVE
6-acetylmorphine: NOT DETECTED ng/mg creat

## 2016-03-27 LAB — TOXASSURE SELECT,+ANTIDEPR,UR: PDF: 0

## 2016-03-29 ENCOUNTER — Telehealth: Payer: Self-pay

## 2016-03-29 NOTE — Telephone Encounter (Signed)
Placed a call to Ms. Mould regarding UDS, she states she takes her Xanax daily, she does not know why it was not detected.  Will continue to monitor

## 2016-03-29 NOTE — Telephone Encounter (Signed)
Pt declared taking Xanax on 03/20/16, the day when UDS when performed. However, it did not show up on UDS. Please advise.

## 2016-04-17 ENCOUNTER — Encounter: Payer: Self-pay | Admitting: Registered Nurse

## 2016-04-17 ENCOUNTER — Encounter: Payer: Medicare Other | Attending: Physical Medicine and Rehabilitation | Admitting: Registered Nurse

## 2016-04-17 VITALS — BP 120/81 | HR 75 | Resp 14

## 2016-04-17 DIAGNOSIS — M961 Postlaminectomy syndrome, not elsewhere classified: Secondary | ICD-10-CM

## 2016-04-17 DIAGNOSIS — G039 Meningitis, unspecified: Secondary | ICD-10-CM | POA: Diagnosis not present

## 2016-04-17 DIAGNOSIS — M5136 Other intervertebral disc degeneration, lumbar region: Secondary | ICD-10-CM | POA: Diagnosis not present

## 2016-04-17 DIAGNOSIS — G894 Chronic pain syndrome: Secondary | ICD-10-CM

## 2016-04-17 DIAGNOSIS — G834 Cauda equina syndrome: Secondary | ICD-10-CM

## 2016-04-17 DIAGNOSIS — M62838 Other muscle spasm: Secondary | ICD-10-CM

## 2016-04-17 DIAGNOSIS — M7062 Trochanteric bursitis, left hip: Secondary | ICD-10-CM

## 2016-04-17 DIAGNOSIS — Z79899 Other long term (current) drug therapy: Secondary | ICD-10-CM

## 2016-04-17 DIAGNOSIS — M51369 Other intervertebral disc degeneration, lumbar region without mention of lumbar back pain or lower extremity pain: Secondary | ICD-10-CM

## 2016-04-17 DIAGNOSIS — Z5181 Encounter for therapeutic drug level monitoring: Secondary | ICD-10-CM

## 2016-04-17 MED ORDER — MORPHINE SULFATE ER 30 MG PO TBCR: 30.0000 mg | EXTENDED_RELEASE_TABLET | Freq: Two times a day (BID) | ORAL | Status: DC

## 2016-04-17 MED ORDER — OXYCODONE-ACETAMINOPHEN 10-325 MG PO TABS: 1.0000 | ORAL_TABLET | Freq: Four times a day (QID) | ORAL | Status: DC | PRN

## 2016-04-17 MED ORDER — ALPRAZOLAM 0.25 MG PO TABS: 0.2500 mg | ORAL_TABLET | Freq: Two times a day (BID) | ORAL | Status: DC | PRN

## 2016-04-17 MED ORDER — METHYLPREDNISOLONE 4 MG PO TBPK: ORAL_TABLET | ORAL | Status: DC

## 2016-04-17 NOTE — Progress Notes (Signed)
Subjective:    Patient ID: Sherry Wong, female    DOB: 04/09/1956, 60 y.o.   MRN: [id number]  HPI:  [name] is a 60 year old female who returns for follow up for chronic pain and medication refill. She states her pain is located in her lower back radiating into her buttocks and lower extremities laterally and bilateral feet. She rates her pain 5.Her current exercise regime is walking and performing stretching exercises and using her bands.   Pain Inventory Average Pain 7 Pain Right Now 5 My pain is sharp, burning, stabbing, tingling and aching  In the last 24 hours, has pain interfered with the following? General activity 8 Relation with others 8 Enjoyment of life 8 What TIME of day is your pain at its worst? all Sleep (in general) Fair  Pain is worse with: walking, bending, standing and some activites Pain improves with: rest, heat/ice and medication Relief from Meds: 5  Mobility walk without assistance use a cane how many minutes can you walk? 10 ability to climb steps?  yes do you drive?  yes Do you have any goals in this area?  yes  Function disabled: date disabled . I need assistance with the following:  meal prep, household duties and shopping Do you have any goals in this area?  yes  Neuro/Psych bladder control problems bowel control problems weakness numbness tingling trouble walking spasms depression  Prior Studies Any changes since last visit?  no  Physicians involved in your care Any changes since last visit?  no   Family History  Problem Relation Age of Onset  . Anesthesia problems Sister   . Hypotension Neg Hx   . Malignant hyperthermia Neg Hx   . Pseudochol deficiency Neg Hx   . Anesthesia problems Mother    Social History   Social History  . Marital Status: Married    Spouse Name: N/A  . Number of Children: 0  . Years of Education: 12   Occupational History  . Disabled    Social History Main Topics  . Smoking  status: Never Smoker   . Smokeless tobacco: Never Used  . Alcohol Use: 0.0 oz/week    0 Standard drinks or equivalent per week     Comment: occ- 0.5 can beer every 2 months  . Drug Use: No  . Sexual Activity: Not Currently   Other Topics Concern  . None   Social History Narrative   Lives at home with husband.   Caffeine use: 1 cup coffee 3 times per week.    Past Surgical History  Procedure Laterality Date  . Cysts removed from ovary    . Cholecystectomy    . Dilation and curettage of uterus    . Breast surgery      left breast lumpectomy  . Breast biopsy      right  . Colonoscopy    . Esophagogastroduodenoscopy    . Lumbar laminectomy/decompression microdiscectomy  12/03/2011    Procedure: LUMBAR LAMINECTOMY/DECOMPRESSION MICRODISCECTOMY 2 LEVELS;  Surgeon: Floyce Stakes, MD;  Location: [place] [facility];  Service: Neurosurgery;  Laterality: Right;  Right Lumbar four-five Lumbar five sacral one Foraminotomies  . Transforaminal lumbar interbody fusion (tlif) with pedicle screw fixation 2 level N/A 08/25/2013    Procedure:  Lumbar four-five, Lumbar five-Sacral one Transforaminal Lumbar Interbody Fusion ;  Surgeon: Floyce Stakes, MD;  Location: [place] [facility];  Service: Neurosurgery;  Laterality: N/A;  . Hematoma evacuation N/A 08/25/2013  Procedure: EVACUATION OF LUMBAR HEMATOMA;  Surgeon: Floyce Stakes, MD;  Location: [facility];  Service: Neurosurgery;  Laterality: N/A;  . Placement of lumbar drain N/A 09/01/2013    Procedure: PLACEMENT OF LUMBAR DRAIN;  Surgeon: Floyce Stakes, MD;  Location: [place] [facility];  Service: Neurosurgery;  Laterality: N/A;  . Lumbar wound debridement N/A 09/01/2013    Procedure: LUMBAR WOUND DEBRIDEMENT;  Surgeon: Floyce Stakes, MD;  Location: [facility];  Service: Neurosurgery;  Laterality: N/A;   Past Medical History  Diagnosis Date  . PONV (postoperative nausea and vomiting)   . Hypertension     takes Losartan daily  . Pneumonia     hx  of 15+yrs ago  . Chronic back pain     ddd/lumbago,and radiculopathy  . Arthritis   . Gout     hx of  . GERD (gastroesophageal reflux disease)     takes Protonix daily  . H/O hiatal hernia   . Gastric ulcer   . Ulcerative colitis   . IBS (irritable bowel syndrome)   . Hx of colonic polyps   . Urinary incontinence   . Nocturia   . Hypothyroidism     takes Levothyroxine daily  . Depression   . Anxiety     takes celexa daily  . Asthma   . Bronchitis    BP 120/81 mmHg  Pulse 75  Resp 14  SpO2 96%  Opioid Risk Score:   Fall Risk Score:  `1  Depression screen PHQ 2/9  Depression screen [facility] 2/9 03/20/2016 12/20/2015 05/23/2015 04/25/2015 02/20/2015  Decreased Interest 0 1 1 1 2   Down, Depressed, Hopeless 0 1 1 1 3   PHQ - 2 Score 0 2 2 2 5   Altered sleeping - - - - 2  Tired, decreased energy - - - - 2  Change in appetite - - - - 0  Feeling bad or failure about yourself  - - - - 0  Trouble concentrating - - - - 0  Moving slowly or fidgety/restless - - - - 0  Suicidal thoughts - - - - 0  PHQ-9 Score - - - - 9     Review of Systems  Eyes: Negative.   Gastrointestinal: Positive for abdominal pain and constipation.  Endocrine: Negative.   Genitourinary: Positive for difficulty urinating.  Musculoskeletal: Positive for myalgias, back pain, arthralgias and gait problem.  Skin: Negative.   Allergic/Immunologic: Negative.   Neurological: Positive for weakness and numbness.       Tingling  Hematological: Negative.   Psychiatric/Behavioral: Positive for dysphoric mood.  All other systems reviewed and are negative.      Objective:   Physical Exam  Constitutional: She is oriented to person, place, and time. She appears well-developed and well-nourished.  HENT:  Head: Normocephalic and atraumatic.  Neck: Normal range of motion. Neck supple.  Cardiovascular: Normal rate and regular rhythm.   Pulmonary/Chest: Effort normal and breath sounds normal.  Musculoskeletal:    Normal Muscle Bulk and Muscle Testing Reveals: Upper Extremities: Full ROM and Muscle Strength 5/5 Lumbar Paraspinal Tenderness: L-3- L-5 Left Greater Trochanteric Tenderness Lower Extremities: Full ROM and Muscle Strength 5/5 Arises from table slowly using 4 prong cane for support Narrow Based Gait  Neurological: She is alert and oriented to person, place, and time.  Skin: Skin is warm and dry.  Psychiatric: She has a normal mood and affect.  Nursing note and vitals reviewed.  Assessment & Plan:  1. Cauda equina syndrome: Incontinence: Wearing Depends:Continue to Monitor  2. Insomnia: Continue with Nortriptyline 3. 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  Refilled: Percocet 10/325 mg #120--use 1 tablet every 6 hours as needed and MS Contin 30 mg one tablet every 12 hours as needed #60.  We will continue the opioid monitoring program, this consists of regular clinic visits, examinations, urine drug screen, pill counts as well as use of [place] Controlled Substance Reporting System.. 4. Depression: Continue with Lexapro. Continue to Monitor  5. Anxiety: Continue with Xanax. Continue to Monitor  6.Neuropathy: Continue Pamelor and Continue To Monitor. 7. Constipation: Continue Senokot 8.Muscle Spasm: Continue Baclofen 9. Left Greater Trochanteric Bursitis: RX: Medrol Dose Pak  20 minutes of face to face patient care time was spent during this visit. All questions were encouraged and answered.

## 2016-05-22 ENCOUNTER — Encounter: Payer: Medicare Other | Attending: Physical Medicine and Rehabilitation | Admitting: Physical Medicine & Rehabilitation

## 2016-05-22 ENCOUNTER — Encounter: Payer: Self-pay | Admitting: Physical Medicine & Rehabilitation

## 2016-05-22 VITALS — BP 112/78 | HR 76 | Resp 14

## 2016-05-22 DIAGNOSIS — G834 Cauda equina syndrome: Secondary | ICD-10-CM | POA: Diagnosis not present

## 2016-05-22 DIAGNOSIS — Z79899 Other long term (current) drug therapy: Secondary | ICD-10-CM | POA: Insufficient documentation

## 2016-05-22 DIAGNOSIS — M5136 Other intervertebral disc degeneration, lumbar region: Secondary | ICD-10-CM | POA: Diagnosis not present

## 2016-05-22 DIAGNOSIS — Z5181 Encounter for therapeutic drug level monitoring: Secondary | ICD-10-CM | POA: Insufficient documentation

## 2016-05-22 DIAGNOSIS — G039 Meningitis, unspecified: Secondary | ICD-10-CM | POA: Diagnosis not present

## 2016-05-22 MED ORDER — MORPHINE SULFATE ER 30 MG PO TBCR: 30.0000 mg | EXTENDED_RELEASE_TABLET | Freq: Two times a day (BID) | ORAL | 0 refills | Status: DC

## 2016-05-22 MED ORDER — OXYCODONE-ACETAMINOPHEN 10-325 MG PO TABS: 1.0000 | ORAL_TABLET | Freq: Four times a day (QID) | ORAL | 0 refills | Status: DC | PRN

## 2016-05-22 NOTE — Patient Instructions (Signed)
HOME SAFETY CHECK  CONTINUE WATER WALKING   HIGH TOP SHOES   CONSIDER WEARING YOUR BRACE WHEN YOU'RE OUT FOR LONGER PERIODS OR ON UNEVEN SURFACES

## 2016-05-22 NOTE — Progress Notes (Signed)
Subjective:     Patient ID: Sherry Wong, female   DOB: [birth year], 60 y.o.   MRN: [id number]  HPI   Sherry Wong is here in follow up of her chronic pain. She has been trying to stay active in the summer. She likes to go to the pool where she water walks. She often goes with her husband to the golf course as well. He has noticed that she has lost her balance frequently on uneven surfaces.   Her pain has been under reasonable control but tends to flare when she's up on her feet more and in the morning when she wakes up. Her memory foam shoes can help decrease pain.    Pain Inventory Average Pain 7 Pain Right Now 6 My pain is sharp, burning, stabbing, tingling and aching  In the last 24 hours, has pain interfered with the following? General activity 8 Relation with others 8 Enjoyment of life 8 What TIME of day is your pain at its worst? all Sleep (in general) Fair  Pain is worse with: walking, bending, standing and some activites Pain improves with: rest, heat/ice, therapy/exercise and medication Relief from Meds: 6  Mobility walk with assistance use a cane how many minutes can you walk? 10 ability to climb steps?  yes do you drive?  yes  Function disabled: date disabled . I need assistance with the following:  meal prep, household duties and shopping  Neuro/Psych bladder control problems bowel control problems weakness numbness tremor tingling trouble walking spasms depression anxiety  Prior Studies Any changes since last visit?  no  Physicians involved in your care Any changes since last visit?  no   Family History  Problem Relation Age of Onset  . Anesthesia problems Sister   . Hypotension Neg Hx   . Malignant hyperthermia Neg Hx   . Pseudochol deficiency Neg Hx   . Anesthesia problems Mother    Social History   Social History  . Marital status: Married    Spouse name: N/A  . Number of children: 0  . Years of education: 12   Occupational History   . Disabled    Social History Main Topics  . Smoking status: Never Smoker  . Smokeless tobacco: Never Used  . Alcohol use 0.0 oz/week     Comment: occ- 0.5 can beer every 2 months  . Drug use: No  . Sexual activity: Not Currently   Other Topics Concern  . None   Social History Narrative   Lives at home with husband.   Caffeine use: 1 cup coffee 3 times per week.    Past Surgical History:  Procedure Laterality Date  . BREAST BIOPSY     right  . BREAST SURGERY     left breast lumpectomy  . CHOLECYSTECTOMY    . COLONOSCOPY    . cysts removed from ovary    . DILATION AND CURETTAGE OF UTERUS    . ESOPHAGOGASTRODUODENOSCOPY    . HEMATOMA EVACUATION N/A 08/25/2013   Procedure: EVACUATION OF LUMBAR HEMATOMA;  Surgeon: Floyce Stakes, MD;  Location: [facility];  Service: Neurosurgery;  Laterality: N/A;  . LUMBAR LAMINECTOMY/DECOMPRESSION MICRODISCECTOMY  12/03/2011   Procedure: LUMBAR LAMINECTOMY/DECOMPRESSION MICRODISCECTOMY 2 LEVELS;  Surgeon: Floyce Stakes, MD;  Location: [place] [facility];  Service: Neurosurgery;  Laterality: Right;  Right Lumbar four-five Lumbar five sacral one Foraminotomies  . LUMBAR WOUND DEBRIDEMENT N/A 09/01/2013   Procedure: LUMBAR WOUND DEBRIDEMENT;  Surgeon: Floyce Stakes, MD;  Location: [place]  ORS;  Service: Neurosurgery;  Laterality: N/A;  . PLACEMENT OF LUMBAR DRAIN N/A 09/01/2013   Procedure: PLACEMENT OF LUMBAR DRAIN;  Surgeon: Floyce Stakes, MD;  Location: [facility];  Service: Neurosurgery;  Laterality: N/A;  . TRANSFORAMINAL LUMBAR INTERBODY FUSION (TLIF) WITH PEDICLE SCREW FIXATION 2 LEVEL N/A 08/25/2013   Procedure:  Lumbar four-five, Lumbar five-Sacral one Transforaminal Lumbar Interbody Fusion ;  Surgeon: Floyce Stakes, MD;  Location: [place] [facility];  Service: Neurosurgery;  Laterality: N/A;   Past Medical History:  Diagnosis Date  . Anxiety    takes celexa daily  . Arthritis   . Asthma   . Bronchitis   . Chronic back pain     ddd/lumbago,and radiculopathy  . Depression   . Gastric ulcer   . GERD (gastroesophageal reflux disease)    takes Protonix daily  . Gout    hx of  . H/O hiatal hernia   . Hx of colonic polyps   . Hypertension    takes Losartan daily  . Hypothyroidism    takes Levothyroxine daily  . IBS (irritable bowel syndrome)   . Nocturia   . Pneumonia    hx of 15+yrs ago  . PONV (postoperative nausea and vomiting)   . Ulcerative colitis   . Urinary incontinence    BP 112/78 (BP Location: Left Arm, Patient Position: Sitting, Cuff Size: Large)   Pulse 76   Resp 14   SpO2 95%   Opioid Risk Score:   Fall Risk Score:  `1  Depression screen PHQ 2/9  Depression screen [facility] 2/9 05/22/2016 03/20/2016 12/20/2015 05/23/2015 04/25/2015 02/20/2015  Decreased Interest 0 0 1 1 1 2   Down, Depressed, Hopeless 0 0 1 1 1 3   PHQ - 2 Score 0 0 2 2 2 5   Altered sleeping - - - - - 2  Tired, decreased energy - - - - - 2  Change in appetite - - - - - 0  Feeling bad or failure about yourself  - - - - - 0  Trouble concentrating - - - - - 0  Moving slowly or fidgety/restless - - - - - 0  Suicidal thoughts - - - - - 0  PHQ-9 Score - - - - - 9   Review of Systems  Respiratory: Positive for shortness of breath and wheezing.   Gastrointestinal: Positive for constipation.  Genitourinary: Positive for difficulty urinating.  All other systems reviewed and are negative.      Objective:   Physical Exam          Constitutional: She is oriented to person, place, and time. She appears well-developed and well-nourished.  HENT:  Head: Normocephalic and atraumatic.  Neck: Normal range of motion. Neck supple.  Cardiovascular: Normal rate and regular rhythm.  Pulmonary/Chest: Effort normal and breath sounds normal.  Musculoskeletal:  Normal Muscle Bulk and Muscle.  Lower Extremities: Full ROM and Muscle Strength 5/5. Antalgic LLE Gait using cane for balance, circumducts left leg to help with clearance. Sensation  remains generally diminished below the waist bilaterally. DTR's 1+. No resting tone or spasms.  Neurological: She is alert and oriented to person, place, and time.  Skin: Skin is warm and dry.  Psychiatric: mood pleasant, overall improved today.    Assessment & Plan:   1. Cauda equina syndrome with arachnoiditis: pt with substantial pain and ongoing sensory loss.  2. Insomnia: Continue with Trazodone and Xanax  3. 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  -refilled: Percocet 10/325 mg #100--1 q6 prn.  -continue ms contin to 30mg  q12 #60  4. Depression: Continue with Celexa. Continue to Monitor  5. Anxiety: Continue with Xanax  6. Neuropathic pain: continue to hold gabapentin ---CONSIDER ANOTHER ANTICONVULSANT FOR NERVE PAIN -nortriptyline- 50mg  qhs- ule   7.30 minutes of face to face patient care time was spent during this visit. All questions were encouraged and answered.   Refills were given for next month.  consider caudal block to decrease pain symptoms.

## 2016-07-01 ENCOUNTER — Other Ambulatory Visit: Payer: Self-pay | Admitting: Registered Nurse

## 2016-07-01 ENCOUNTER — Other Ambulatory Visit: Payer: Self-pay | Admitting: Physical Medicine & Rehabilitation

## 2016-07-15 DIAGNOSIS — M4800 Spinal stenosis, site unspecified: Secondary | ICD-10-CM | POA: Diagnosis not present

## 2016-07-15 DIAGNOSIS — I10 Essential (primary) hypertension: Secondary | ICD-10-CM | POA: Diagnosis not present

## 2016-07-15 DIAGNOSIS — M545 Low back pain: Secondary | ICD-10-CM | POA: Diagnosis not present

## 2016-07-15 DIAGNOSIS — Z23 Encounter for immunization: Secondary | ICD-10-CM | POA: Diagnosis not present

## 2016-07-15 DIAGNOSIS — E039 Hypothyroidism, unspecified: Secondary | ICD-10-CM | POA: Diagnosis not present

## 2016-07-22 ENCOUNTER — Encounter: Payer: Self-pay | Admitting: Registered Nurse

## 2016-07-22 ENCOUNTER — Encounter: Payer: Medicare Other | Attending: Physical Medicine and Rehabilitation | Admitting: Registered Nurse

## 2016-07-22 VITALS — BP 112/88 | HR 88 | Resp 14

## 2016-07-22 DIAGNOSIS — M961 Postlaminectomy syndrome, not elsewhere classified: Secondary | ICD-10-CM

## 2016-07-22 DIAGNOSIS — G039 Meningitis, unspecified: Secondary | ICD-10-CM

## 2016-07-22 DIAGNOSIS — Z79899 Other long term (current) drug therapy: Secondary | ICD-10-CM | POA: Diagnosis not present

## 2016-07-22 DIAGNOSIS — Z5181 Encounter for therapeutic drug level monitoring: Secondary | ICD-10-CM

## 2016-07-22 DIAGNOSIS — M62838 Other muscle spasm: Secondary | ICD-10-CM | POA: Diagnosis not present

## 2016-07-22 DIAGNOSIS — M5136 Other intervertebral disc degeneration, lumbar region: Secondary | ICD-10-CM | POA: Insufficient documentation

## 2016-07-22 DIAGNOSIS — G834 Cauda equina syndrome: Secondary | ICD-10-CM | POA: Diagnosis not present

## 2016-07-22 DIAGNOSIS — M51369 Other intervertebral disc degeneration, lumbar region without mention of lumbar back pain or lower extremity pain: Secondary | ICD-10-CM

## 2016-07-22 DIAGNOSIS — M7062 Trochanteric bursitis, left hip: Secondary | ICD-10-CM

## 2016-07-22 DIAGNOSIS — G894 Chronic pain syndrome: Secondary | ICD-10-CM

## 2016-07-22 DIAGNOSIS — M7061 Trochanteric bursitis, right hip: Secondary | ICD-10-CM

## 2016-07-22 DIAGNOSIS — F411 Generalized anxiety disorder: Secondary | ICD-10-CM

## 2016-07-22 DIAGNOSIS — F3289 Other specified depressive episodes: Secondary | ICD-10-CM

## 2016-07-22 MED ORDER — MORPHINE SULFATE ER 30 MG PO TBCR: 30.0000 mg | EXTENDED_RELEASE_TABLET | Freq: Two times a day (BID) | ORAL | 0 refills | Status: DC

## 2016-07-22 MED ORDER — OXYCODONE-ACETAMINOPHEN 10-325 MG PO TABS: 1.0000 | ORAL_TABLET | Freq: Four times a day (QID) | ORAL | 0 refills | Status: DC | PRN

## 2016-07-22 MED ORDER — ALPRAZOLAM 0.25 MG PO TABS: 0.2500 mg | ORAL_TABLET | Freq: Two times a day (BID) | ORAL | 2 refills | Status: DC | PRN

## 2016-07-22 NOTE — Progress Notes (Signed)
Subjective:    Patient ID: Sherry Wong, female    DOB: 1956/06/04, 60 y.o.   MRN: [id number]  HPI: Sherry Wong is a 60 year old female who returns for follow up for chronic pain and medication refill. She states her pain is located in her lower back radiating into her bilateral hips and lower extremities laterally and bilateral feet. She rates her pain 5.Her current exercise regime is walking and performing stretching exercises.  [name] states her Liver enzymes are elevated and her PCP has ordered and Ultrasound. She will call office when she has the results she states.   Pain Inventory Average Pain 7 Pain Right Now 5 My pain is sharp, burning, stabbing, tingling and aching  In the last 24 hours, has pain interfered with the following? General activity 8 Relation with others 8 Enjoyment of life 8 What TIME of day is your pain at its worst? all Sleep (in general) Fair  Pain is worse with: walking, bending, sitting, standing and some activites Pain improves with: rest, heat/ice and medication Relief from Meds: 5  Mobility walk with assistance use a cane use a walker how many minutes can you walk? 10 ability to climb steps?  yes do you drive?  yes Do you have any goals in this area?  yes  Function disabled: date disabled 2013  Neuro/Psych bladder control problems bowel control problems numbness tremor tingling trouble walking spasms depression anxiety  Prior Studies Any changes since last visit?  no  Physicians involved in your care Any changes since last visit?  no   Family History  Problem Relation Age of Onset  . Anesthesia problems Sister   . Hypotension Neg Hx   . Malignant hyperthermia Neg Hx   . Pseudochol deficiency Neg Hx   . Anesthesia problems Mother    Social History   Social History  . Marital status: Married    Spouse name: N/A  . Number of children: 0  . Years of education: 12   Occupational History  . Disabled      Social History Main Topics  . Smoking status: Never Smoker  . Smokeless tobacco: Never Used  . Alcohol use 0.0 oz/week     Comment: occ- 0.5 can beer every 2 months  . Drug use: No  . Sexual activity: Not Currently   Other Topics Concern  . None   Social History Narrative   Lives at home with husband.   Caffeine use: 1 cup coffee 3 times per week.    Past Surgical History:  Procedure Laterality Date  . BREAST BIOPSY     right  . BREAST SURGERY     left breast lumpectomy  . CHOLECYSTECTOMY    . COLONOSCOPY    . cysts removed from ovary    . DILATION AND CURETTAGE OF UTERUS    . ESOPHAGOGASTRODUODENOSCOPY    . HEMATOMA EVACUATION N/A 08/25/2013   Procedure: EVACUATION OF LUMBAR HEMATOMA;  Surgeon: Floyce Stakes, MD;  Location: [facility];  Service: Neurosurgery;  Laterality: N/A;  . LUMBAR LAMINECTOMY/DECOMPRESSION MICRODISCECTOMY  12/03/2011   Procedure: LUMBAR LAMINECTOMY/DECOMPRESSION MICRODISCECTOMY 2 LEVELS;  Surgeon: Floyce Stakes, MD;  Location: [place] [facility];  Service: Neurosurgery;  Laterality: Right;  Right Lumbar four-five Lumbar five sacral one Foraminotomies  . LUMBAR WOUND DEBRIDEMENT N/A 09/01/2013   Procedure: LUMBAR WOUND DEBRIDEMENT;  Surgeon: Floyce Stakes, MD;  Location: [facility];  Service: Neurosurgery;  Laterality: N/A;  . PLACEMENT  OF LUMBAR DRAIN N/A 09/01/2013   Procedure: PLACEMENT OF LUMBAR DRAIN;  Surgeon: Floyce Stakes, MD;  Location: [facility];  Service: Neurosurgery;  Laterality: N/A;  . TRANSFORAMINAL LUMBAR INTERBODY FUSION (TLIF) WITH PEDICLE SCREW FIXATION 2 LEVEL N/A 08/25/2013   Procedure:  Lumbar four-five, Lumbar five-Sacral one Transforaminal Lumbar Interbody Fusion ;  Surgeon: Floyce Stakes, MD;  Location: [place] [facility];  Service: Neurosurgery;  Laterality: N/A;   Past Medical History:  Diagnosis Date  . Anxiety    takes celexa daily  . Arthritis   . Asthma   . Bronchitis   . Chronic back pain    ddd/lumbago,and  radiculopathy  . Depression   . Gastric ulcer   . GERD (gastroesophageal reflux disease)    takes Protonix daily  . Gout    hx of  . H/O hiatal hernia   . Hx of colonic polyps   . Hypertension    takes Losartan daily  . Hypothyroidism    takes Levothyroxine daily  . IBS (irritable bowel syndrome)   . Nocturia   . Pneumonia    hx of 15+yrs ago  . PONV (postoperative nausea and vomiting)   . Ulcerative colitis   . Urinary incontinence    BP 112/88   Pulse 88   Resp 14   SpO2 94%   Opioid Risk Score:   Fall Risk Score:  `1  Depression screen PHQ 2/9  Depression screen [facility] 2/9 05/22/2016 03/20/2016 12/20/2015 05/23/2015 04/25/2015 02/20/2015  Decreased Interest 0 0 1 1 1 2   Down, Depressed, Hopeless 0 0 1 1 1 3   PHQ - 2 Score 0 0 2 2 2 5   Altered sleeping - - - - - 2  Tired, decreased energy - - - - - 2  Change in appetite - - - - - 0  Feeling bad or failure about yourself  - - - - - 0  Trouble concentrating - - - - - 0  Moving slowly or fidgety/restless - - - - - 0  Suicidal thoughts - - - - - 0  PHQ-9 Score - - - - - 9    Review of Systems  Gastrointestinal: Positive for abdominal pain and constipation.  Genitourinary: Positive for decreased urine volume.  All other systems reviewed and are negative.      Objective:   Physical Exam  Constitutional: She is oriented to person, place, and time. She appears well-developed and well-nourished.  HENT:  Head: Normocephalic and atraumatic.  Neck: Normal range of motion. Neck supple.  Cardiovascular: Normal rate and regular rhythm.   Pulmonary/Chest: Effort normal and breath sounds normal.  Musculoskeletal:  Normal Muscle Bulk and Muscle Testing Reveals: Upper Extremities: Full ROM and Muscle Strength 5/5 Lumbar Paraspinal Tenderness: L-3- L-5  Bilateral Greater Trochanteric Tenderness Lower Extremities: Full ROM and Muscle Strength 5/5 Arises from Table slowly using 4 prong cane for support Narrow based Gait    Neurological: She is alert and oriented to person, place, and time.  Skin: Skin is warm and dry.  Psychiatric: She has a normal mood and affect.  Nursing note and vitals reviewed.         Assessment & Plan:  1. Cauda equina syndrome: Incontinence: Wearing Depends:Continue to Monitor  2. Insomnia: Continue with Nortriptyline 3. 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  Refilled: Percocet 10/325 mg #120--use 1 tablet every 6 hours as neededand MS Contin 30 mg one  tablet every 12 hours as needed #60. Second script for the following month. Third prescription to accommodate scheduled appointment with Dr. Naaman Plummer.  We will continue the opioid monitoring program, this consists of regular clinic visits, examinations, urine drug screen, pill counts as well as use of [place] Controlled Substance Reporting System.. 4. Depression: Continue with Lexapro. Continue to Monitor  5. Anxiety: Continue with Xanax. Continue to Monitor  6.Neuropathy: Continue Pamelor and Continue To Monitor. 7. Constipation: Continue Senokot 8.Muscle Spasm: Continue Baclofen 9. Bilateral Greater Trochanteric Bursitis: Alternate Ice and heat therapy.  20 minutes of face to face patient care time was spent during this visit. All questions were encouraged and answered.

## 2016-07-23 DIAGNOSIS — N281 Cyst of kidney, acquired: Secondary | ICD-10-CM | POA: Diagnosis not present

## 2016-07-23 DIAGNOSIS — R7989 Other specified abnormal findings of blood chemistry: Secondary | ICD-10-CM | POA: Diagnosis not present

## 2016-07-23 DIAGNOSIS — R748 Abnormal levels of other serum enzymes: Secondary | ICD-10-CM | POA: Diagnosis not present

## 2016-07-23 DIAGNOSIS — Z9049 Acquired absence of other specified parts of digestive tract: Secondary | ICD-10-CM | POA: Diagnosis not present

## 2016-08-19 ENCOUNTER — Other Ambulatory Visit: Payer: Self-pay | Admitting: Registered Nurse

## 2016-08-20 DIAGNOSIS — I10 Essential (primary) hypertension: Secondary | ICD-10-CM | POA: Diagnosis not present

## 2016-08-20 DIAGNOSIS — R7989 Other specified abnormal findings of blood chemistry: Secondary | ICD-10-CM | POA: Diagnosis not present

## 2016-08-20 DIAGNOSIS — E876 Hypokalemia: Secondary | ICD-10-CM | POA: Diagnosis not present

## 2016-09-25 ENCOUNTER — Encounter: Payer: Medicare Other | Attending: Physical Medicine and Rehabilitation | Admitting: Physical Medicine & Rehabilitation

## 2016-09-25 ENCOUNTER — Encounter: Payer: Self-pay | Admitting: Physical Medicine & Rehabilitation

## 2016-09-25 VITALS — BP 106/83 | HR 89

## 2016-09-25 DIAGNOSIS — G039 Meningitis, unspecified: Secondary | ICD-10-CM | POA: Insufficient documentation

## 2016-09-25 DIAGNOSIS — G834 Cauda equina syndrome: Secondary | ICD-10-CM | POA: Diagnosis not present

## 2016-09-25 DIAGNOSIS — M5136 Other intervertebral disc degeneration, lumbar region: Secondary | ICD-10-CM | POA: Insufficient documentation

## 2016-09-25 DIAGNOSIS — Z79899 Other long term (current) drug therapy: Secondary | ICD-10-CM | POA: Diagnosis not present

## 2016-09-25 DIAGNOSIS — Z5181 Encounter for therapeutic drug level monitoring: Secondary | ICD-10-CM

## 2016-09-25 MED ORDER — OXYCODONE-ACETAMINOPHEN 10-325 MG PO TABS: 1.0000 | ORAL_TABLET | Freq: Four times a day (QID) | ORAL | 0 refills | Status: DC | PRN

## 2016-09-25 MED ORDER — MORPHINE SULFATE ER 30 MG PO TBCR: 30.0000 mg | EXTENDED_RELEASE_TABLET | Freq: Two times a day (BID) | ORAL | 0 refills | Status: DC

## 2016-09-25 MED ORDER — PREGABALIN 25 MG PO CAPS: 25.0000 mg | ORAL_CAPSULE | Freq: Two times a day (BID) | ORAL | 3 refills | Status: DC

## 2016-09-25 NOTE — Progress Notes (Signed)
Subjective:    Patient ID: Sherry Wong, female    DOB: 19-Sep-1956, 60 y.o.   MRN: [id number]  HPI   Sherry Wong is here regarding her chronic back and leg pain. She has had more pain with the colder weather. She has also been "under the weather" lately too. Additionally, her husband has had some major health issues which have added stress to her life.  She continues on the ms contin and percocet fo rpain control which seem to keep things manageable. She feels that the pamelor has helped also. We had used gabapentin in the past, but she felt that it was no longer helping, so we weaned off this.  From a health standpoint otherwise, she has been doing ok.    Pain Inventory Average Pain 7 Pain Right Now 2 My pain is sharp, burning, dull, stabbing, tingling and aching  In the last 24 hours, has pain interfered with the following? General activity 8 Relation with others 8 Enjoyment of life 9 What TIME of day is your pain at its worst? all Sleep (in general) Fair  Pain is worse with: walking, bending, standing and some activites Pain improves with: rest, heat/ice, pacing activities and medication Relief from Meds: 5  Mobility walk with assistance use a cane ability to climb steps?  yes do you drive?  yes Do you have any goals in this area?  yes  Function disabled: date disabled 2013 I need assistance with the following:  meal prep, household duties and shopping  Neuro/Psych bladder control problems bowel control problems weakness numbness tremor tingling trouble walking spasms depression anxiety  Prior Studies Any changes since last visit?  no  Physicians involved in your care Any changes since last visit?  no   Family History  Problem Relation Age of Onset  . Anesthesia problems Sister   . Hypotension Neg Hx   . Malignant hyperthermia Neg Hx   . Pseudochol deficiency Neg Hx   . Anesthesia problems Mother    Social History   Social History  . Marital  status: Married    Spouse name: N/A  . Number of children: 0  . Years of education: 12   Occupational History  . Disabled    Social History Main Topics  . Smoking status: Never Smoker  . Smokeless tobacco: Never Used  . Alcohol use 0.0 oz/week     Comment: occ- 0.5 can beer every 2 months  . Drug use: No  . Sexual activity: Not Currently   Other Topics Concern  . Not on file   Social History Narrative   Lives at home with husband.   Caffeine use: 1 cup coffee 3 times per week.    Past Surgical History:  Procedure Laterality Date  . BREAST BIOPSY     right  . BREAST SURGERY     left breast lumpectomy  . CHOLECYSTECTOMY    . COLONOSCOPY    . cysts removed from ovary    . DILATION AND CURETTAGE OF UTERUS    . ESOPHAGOGASTRODUODENOSCOPY    . HEMATOMA EVACUATION N/A 08/25/2013   Procedure: EVACUATION OF LUMBAR HEMATOMA;  Surgeon: Floyce Stakes, MD;  Location: [facility];  Service: Neurosurgery;  Laterality: N/A;  . LUMBAR LAMINECTOMY/DECOMPRESSION MICRODISCECTOMY  12/03/2011   Procedure: LUMBAR LAMINECTOMY/DECOMPRESSION MICRODISCECTOMY 2 LEVELS;  Surgeon: Floyce Stakes, MD;  Location: [place] [facility];  Service: Neurosurgery;  Laterality: Right;  Right Lumbar four-five Lumbar five sacral one Foraminotomies  . LUMBAR WOUND DEBRIDEMENT  N/A 09/01/2013   Procedure: LUMBAR WOUND DEBRIDEMENT;  Surgeon: Floyce Stakes, MD;  Location: [place] [facility];  Service: Neurosurgery;  Laterality: N/A;  . PLACEMENT OF LUMBAR DRAIN N/A 09/01/2013   Procedure: PLACEMENT OF LUMBAR DRAIN;  Surgeon: Floyce Stakes, MD;  Location: [facility];  Service: Neurosurgery;  Laterality: N/A;  . TRANSFORAMINAL LUMBAR INTERBODY FUSION (TLIF) WITH PEDICLE SCREW FIXATION 2 LEVEL N/A 08/25/2013   Procedure:  Lumbar four-five, Lumbar five-Sacral one Transforaminal Lumbar Interbody Fusion ;  Surgeon: Floyce Stakes, MD;  Location: [place] [facility];  Service: Neurosurgery;  Laterality: N/A;   Past Medical History:    Diagnosis Date  . Anxiety    takes celexa daily  . Arthritis   . Asthma   . Bronchitis   . Chronic back pain    ddd/lumbago,and radiculopathy  . Depression   . Gastric ulcer   . GERD (gastroesophageal reflux disease)    takes Protonix daily  . Gout    hx of  . H/O hiatal hernia   . Hx of colonic polyps   . Hypertension    takes Losartan daily  . Hypothyroidism    takes Levothyroxine daily  . IBS (irritable bowel syndrome)   . Nocturia   . Pneumonia    hx of 15+yrs ago  . PONV (postoperative nausea and vomiting)   . Ulcerative colitis   . Urinary incontinence    There were no vitals taken for this visit.  Opioid Risk Score:   Fall Risk Score:  `1  Depression screen PHQ 2/9  Depression screen [facility] 2/9 05/22/2016 03/20/2016 12/20/2015 05/23/2015 04/25/2015 02/20/2015  Decreased Interest 0 0 1 1 1 2   Down, Depressed, Hopeless 0 0 1 1 1 3   PHQ - 2 Score 0 0 2 2 2 5   Altered sleeping - - - - - 2  Tired, decreased energy - - - - - 2  Change in appetite - - - - - 0  Feeling bad or failure about yourself  - - - - - 0  Trouble concentrating - - - - - 0  Moving slowly or fidgety/restless - - - - - 0  Suicidal thoughts - - - - - 0  PHQ-9 Score - - - - - 9   Review of Systems  HENT: Negative.   Eyes: Negative.   Respiratory: Negative.   Cardiovascular: Negative.   Gastrointestinal: Negative.        Bowel control problems  Endocrine: Negative.   Genitourinary: Positive for difficulty urinating.  Musculoskeletal: Positive for back pain and gait problem.  Skin: Negative.   Allergic/Immunologic: Negative.   Neurological: Positive for tremors, weakness and numbness.       Tingling  Hematological: Negative.   Psychiatric/Behavioral: Positive for dysphoric mood. The patient is nervous/anxious.   All other systems reviewed and are negative.      Objective:   Physical Exam  Constitutional: She is oriented to person, place, and time. She appears well-developed and  well-nourished.  HENT:  Head: Normocephalic and atraumatic.  Neck: Normal range of motion. Neck supple.  Cardiovascular: RRR.  Pulmonary/Chest: normal effort.  Musculoskeletal:  Normal Muscle Bulk and Muscle.  Lower Extremities: Full ROM and Muscle Strength 5/5. Antalgic LLE Gait using cane for balance, with some circumduction with swing on left noted.  Sensation remains diminished below the waist bilaterally. DTR's remain 1+. No resting tone or spasms.  Neurological: She is alert and oriented to person, place, and time.  Skin:  Skin is warm and dry.  Psychiatric: mood pleasant, a little flat    Assessment & Plan:  1. Cauda equina syndrome with arachnoiditis: pt with substantial pain and ongoing sensory loss.  2. Insomnia: Continue with Trazodone and Xanax  3. DDD L4-S1 s/p diskectomy with decompression of thecal sac: S .  -refilled: Percocet 10/325 mg #100--1 q6 prn.  -continue ms contin to 30mg  q12 #60  4. Depression: Continue with Celexa. Continue to Monitor  5. Anxiety: Continue with Xanax  6. Neuropathic pain: will begin a trial of lyrica 25mg  qhs then bid---titrate further to tolerance/effect -nortriptyline- 50mg  qhs-   7. Fifteen minutes of face to face patient care time was spent during this visit. All questions were encouraged and answered.   Refills were given for next month.   Again can consider caudal block to decrease pain symptoms.

## 2016-09-25 NOTE — Patient Instructions (Signed)
TAKE LYRICA 25MG  AT BEDTIME FOR 4 DAYS THEN INCREASE TO TWICE DAILY.   WE CAN INCREASE THIS FURTHER OVER THE PHONE IF THIS DOSE IF INEFFECTIVE.     PLEASE CALL ME WITH ANY PROBLEMS OR QUESTIONS [phone]   HAPPY HOLIDAYS!!!!                    *                * *             *   *   *         *  *   *  *  *     *  *  *  *  *  *  * *  *  *  *  *  *  *  *  *  * *               *  *               *  *               *  *

## 2016-09-25 NOTE — Addendum Note (Signed)
Addended by: Marland Mcalpine B on: 09/25/2016 03:20 PM   Modules accepted: Orders

## 2016-10-02 LAB — 6-ACETYLMORPHINE,TOXASSURE ADD
6-ACETYLMORPHINE: NEGATIVE
6-acetylmorphine: NOT DETECTED ng/mg creat

## 2016-10-02 LAB — TOXASSURE SELECT,+ANTIDEPR,UR

## 2016-11-26 ENCOUNTER — Ambulatory Visit: Payer: Medicare Other | Admitting: Physical Medicine & Rehabilitation

## 2016-11-27 ENCOUNTER — Telehealth: Payer: Self-pay | Admitting: Registered Nurse

## 2016-11-27 ENCOUNTER — Encounter: Payer: Medicare Other | Attending: Physical Medicine and Rehabilitation | Admitting: Registered Nurse

## 2016-11-27 ENCOUNTER — Encounter: Payer: Self-pay | Admitting: Registered Nurse

## 2016-11-27 VITALS — BP 104/72 | HR 82 | Resp 14

## 2016-11-27 DIAGNOSIS — M5136 Other intervertebral disc degeneration, lumbar region: Secondary | ICD-10-CM | POA: Diagnosis not present

## 2016-11-27 DIAGNOSIS — M7061 Trochanteric bursitis, right hip: Secondary | ICD-10-CM

## 2016-11-27 DIAGNOSIS — M62838 Other muscle spasm: Secondary | ICD-10-CM | POA: Diagnosis not present

## 2016-11-27 DIAGNOSIS — M961 Postlaminectomy syndrome, not elsewhere classified: Secondary | ICD-10-CM

## 2016-11-27 DIAGNOSIS — G039 Meningitis, unspecified: Secondary | ICD-10-CM | POA: Insufficient documentation

## 2016-11-27 DIAGNOSIS — Z5181 Encounter for therapeutic drug level monitoring: Secondary | ICD-10-CM | POA: Insufficient documentation

## 2016-11-27 DIAGNOSIS — G834 Cauda equina syndrome: Secondary | ICD-10-CM | POA: Diagnosis not present

## 2016-11-27 DIAGNOSIS — Z79899 Other long term (current) drug therapy: Secondary | ICD-10-CM | POA: Insufficient documentation

## 2016-11-27 DIAGNOSIS — M7062 Trochanteric bursitis, left hip: Secondary | ICD-10-CM

## 2016-11-27 DIAGNOSIS — G4700 Insomnia, unspecified: Secondary | ICD-10-CM

## 2016-11-27 MED ORDER — OXYCODONE-ACETAMINOPHEN 10-325 MG PO TABS: 1.0000 | ORAL_TABLET | Freq: Four times a day (QID) | ORAL | 0 refills | Status: DC | PRN

## 2016-11-27 MED ORDER — ALPRAZOLAM 0.25 MG PO TABS: 0.2500 mg | ORAL_TABLET | Freq: Two times a day (BID) | ORAL | 2 refills | Status: DC | PRN

## 2016-11-27 MED ORDER — MORPHINE SULFATE ER 30 MG PO TBCR
30.0000 mg | EXTENDED_RELEASE_TABLET | Freq: Two times a day (BID) | ORAL | 0 refills | Status: DC
Start: 2016-11-27 — End: 2017-01-22

## 2016-11-27 MED ORDER — BACLOFEN 20 MG PO TABS: 20.0000 mg | ORAL_TABLET | Freq: Three times a day (TID) | ORAL | 2 refills | Status: DC

## 2016-11-27 MED ORDER — MORPHINE SULFATE ER 30 MG PO TBCR: 30.0000 mg | EXTENDED_RELEASE_TABLET | Freq: Two times a day (BID) | ORAL | 0 refills | Status: DC

## 2016-11-27 MED ORDER — GABAPENTIN 100 MG PO CAPS: 100.0000 mg | ORAL_CAPSULE | Freq: Three times a day (TID) | ORAL | 3 refills | Status: DC

## 2016-11-27 NOTE — Telephone Encounter (Signed)
On 11/27/2016 the [name] was reviewed no conflict was seen on the [name] with multiple prescribers. Sherry Wong has a signed narcotic contract with our office. If there were any discrepancies this would have been reported to her physician.

## 2016-11-27 NOTE — Progress Notes (Signed)
Subjective:    Patient ID: Sherry Wong, female    DOB: 30-Jan-1956, 61 y.o.   MRN: [id number]  HPI: [name] is a 61 year old female who returns for follow up for chronic pain and medication refill. She states her pain is located in her lower back radiating into her bilateral lower extremities laterally and into her bilateral feet. She rates her pain 8.Her current exercise regime is walking and performing stretching exercises.  [name] was prescribed Lyrica, she developed daytime drowsiness and states she felt as though she was in a " fog". Lyrica discontinued and gabapentin resume, she verbalizes understanding.   Pain Inventory Average Pain 7 Pain Right Now 8 My pain is sharp, burning, dull, stabbing, tingling and aching  In the last 24 hours, has pain interfered with the following? General activity 8 Relation with others 8 Enjoyment of life 8 What TIME of day is your pain at its worst? All Sleep (in general) Fair  Pain is worse with: walking, bending, sitting, standing and some activites Pain improves with: rest, heat/ice, pacing activities and medication Relief from Meds: 3  Mobility walk with assistance use a cane use a walker how many minutes can you walk? 10 ability to climb steps?  yes do you drive?  yes Do you have any goals in this area?  yes  Function disabled: date disabled 04-30-12 I need assistance with the following:  meal prep, household duties and shopping Do you have any goals in this area?  yes  Neuro/Psych bladder control problems bowel control problems weakness numbness tremor tingling trouble walking spasms dizziness depression anxiety  Prior Studies Any changes since last visit?  no  Physicians involved in your care Any changes since last visit?  no   Family History  Problem Relation Age of Onset  . Anesthesia problems Sister   . Hypotension Neg Hx   . Malignant hyperthermia Neg Hx   . Pseudochol deficiency  Neg Hx   . Anesthesia problems Mother    Social History   Social History  . Marital status: Married    Spouse name: N/A  . Number of children: 0  . Years of education: 12   Occupational History  . Disabled    Social History Main Topics  . Smoking status: Never Smoker  . Smokeless tobacco: Never Used  . Alcohol use 0.0 oz/week     Comment: occ- 0.5 can beer every 2 months  . Drug use: No  . Sexual activity: Not Currently   Other Topics Concern  . None   Social History Narrative   Lives at home with husband.   Caffeine use: 1 cup coffee 3 times per week.    Past Surgical History:  Procedure Laterality Date  . BREAST BIOPSY     right  . BREAST SURGERY     left breast lumpectomy  . CHOLECYSTECTOMY    . COLONOSCOPY    . cysts removed from ovary    . DILATION AND CURETTAGE OF UTERUS    . ESOPHAGOGASTRODUODENOSCOPY    . HEMATOMA EVACUATION N/A 08/25/2013   Procedure: EVACUATION OF LUMBAR HEMATOMA;  Surgeon: Floyce Stakes, MD;  Location: [facility];  Service: Neurosurgery;  Laterality: N/A;  . LUMBAR LAMINECTOMY/DECOMPRESSION MICRODISCECTOMY  12/03/2011   Procedure: LUMBAR LAMINECTOMY/DECOMPRESSION MICRODISCECTOMY 2 LEVELS;  Surgeon: Floyce Stakes, MD;  Location: [place] [facility];  Service: Neurosurgery;  Laterality: Right;  Right Lumbar four-five Lumbar five sacral one Foraminotomies  . LUMBAR  WOUND DEBRIDEMENT N/A 09/01/2013   Procedure: LUMBAR WOUND DEBRIDEMENT;  Surgeon: Floyce Stakes, MD;  Location: [facility];  Service: Neurosurgery;  Laterality: N/A;  . PLACEMENT OF LUMBAR DRAIN N/A 09/01/2013   Procedure: PLACEMENT OF LUMBAR DRAIN;  Surgeon: Floyce Stakes, MD;  Location: [facility];  Service: Neurosurgery;  Laterality: N/A;  . TRANSFORAMINAL LUMBAR INTERBODY FUSION (TLIF) WITH PEDICLE SCREW FIXATION 2 LEVEL N/A 08/25/2013   Procedure:  Lumbar four-five, Lumbar five-Sacral one Transforaminal Lumbar Interbody Fusion ;  Surgeon: Floyce Stakes, MD;  Location:  [place] [facility];  Service: Neurosurgery;  Laterality: N/A;   Past Medical History:  Diagnosis Date  . Anxiety    takes celexa daily  . Arthritis   . Asthma   . Bronchitis   . Chronic back pain    ddd/lumbago,and radiculopathy  . Depression   . Gastric ulcer   . GERD (gastroesophageal reflux disease)    takes Protonix daily  . Gout    hx of  . H/O hiatal hernia   . Hx of colonic polyps   . Hypertension    takes Losartan daily  . Hypothyroidism    takes Levothyroxine daily  . IBS (irritable bowel syndrome)   . Nocturia   . Pneumonia    hx of 15+yrs ago  . PONV (postoperative nausea and vomiting)   . Ulcerative colitis   . Urinary incontinence    BP 104/72   Pulse 82   Resp 14   SpO2 93%   Opioid Risk Score:   Fall Risk Score:  `1  Depression screen PHQ 2/9  Depression screen [facility] 2/9 05/22/2016 03/20/2016 12/20/2015 05/23/2015 04/25/2015 02/20/2015  Decreased Interest 0 0 1 1 1 2   Down, Depressed, Hopeless 0 0 1 1 1 3   PHQ - 2 Score 0 0 2 2 2 5   Altered sleeping - - - - - 2  Tired, decreased energy - - - - - 2  Change in appetite - - - - - 0  Feeling bad or failure about yourself  - - - - - 0  Trouble concentrating - - - - - 0  Moving slowly or fidgety/restless - - - - - 0  Suicidal thoughts - - - - - 0  PHQ-9 Score - - - - - 9    Review of Systems  Constitutional: Negative.   HENT: Negative.   Eyes: Negative.   Respiratory: Negative.   Cardiovascular: Negative.   Gastrointestinal: Positive for abdominal pain, constipation and nausea.  Endocrine: Negative.   Genitourinary: Positive for decreased urine volume.  Musculoskeletal: Negative.   Skin: Negative.   Allergic/Immunologic: Negative.   Neurological: Negative.   Hematological: Negative.   Psychiatric/Behavioral: Negative.   All other systems reviewed and are negative.      Objective:   Physical Exam  Constitutional: She is oriented to person, place, and time. She appears well-developed and  well-nourished.  HENT:  Head: Normocephalic and atraumatic.  Neck: Normal range of motion. Neck supple.  Cardiovascular: Normal rate and regular rhythm.   Pulmonary/Chest: Effort normal and breath sounds normal.  Musculoskeletal:  Normal Muscle Bulk and Muscle Testing Reveals:  Upper Extremities: Full ROM and Muscle Strength 5/5 Lumbar Paraspinal Tenderness: L-3-L-5 Lower Extremities: Full ROM and Muscle Strength 5/5 Arises from Table Slowly using 4 prong cane for support Narrow Based Gait  Neurological: She is alert and oriented to person, place, and time.  Skin: Skin is warm and dry.  Psychiatric: She  has a normal mood and affect.  Nursing note and vitals reviewed.         Assessment & Plan:  1. Cauda equina syndrome: Incontinence: Wearing Depends:Continue to Monitor. 11/27/2016 2. Insomnia: Continue with Nortriptyline. 11/27/2016 3. 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 11/27/2016 Refilled: Percocet 10/325 mg #120--use 1 tablet every 6 hours as neededand MS Contin 30 mg one tablet every 12 hours as needed #60.  We will continue the opioid monitoring program, this consists of regular clinic visits, examinations, urine drug screen, pill counts as well as use of [place] Controlled Substance Reporting System.. 4. Depression: Continue with Lexapro. Continue to Monitor. 11/27/2016 5. Anxiety: Continue with Xanax. Continue to Monitor. 11/27/2016 6.Neuropathy:RX: Gabapentin. Continue Pamelor. 11/27/2016 7. Constipation: Continue Senokot. 11/27/2016 8.Muscle Spasm: Continue Baclofen. 11/27/2016 9. Bilateral Greater Trochanteric Bursitis: Alternate Ice and heat therapy. 11/27/2016  20 minutes of face to face patient care time was spent during this visit. All questions were encouraged and answered

## 2016-11-27 NOTE — Patient Instructions (Signed)
Start Gabapentin Today:  If Pain Persists in a week increase Gabapentin to two capsules three times a day  Call with any questions : [phone]

## 2016-11-28 ENCOUNTER — Other Ambulatory Visit: Payer: Self-pay | Admitting: Physical Medicine & Rehabilitation

## 2016-12-02 ENCOUNTER — Ambulatory Visit: Payer: Medicare Other | Admitting: Physical Medicine & Rehabilitation

## 2016-12-11 DIAGNOSIS — J189 Pneumonia, unspecified organism: Secondary | ICD-10-CM | POA: Diagnosis not present

## 2016-12-11 DIAGNOSIS — R079 Chest pain, unspecified: Secondary | ICD-10-CM | POA: Diagnosis not present

## 2016-12-16 DIAGNOSIS — R079 Chest pain, unspecified: Secondary | ICD-10-CM | POA: Diagnosis not present

## 2016-12-17 DIAGNOSIS — E876 Hypokalemia: Secondary | ICD-10-CM | POA: Diagnosis not present

## 2016-12-17 DIAGNOSIS — R7989 Other specified abnormal findings of blood chemistry: Secondary | ICD-10-CM | POA: Diagnosis not present

## 2016-12-17 DIAGNOSIS — K219 Gastro-esophageal reflux disease without esophagitis: Secondary | ICD-10-CM | POA: Diagnosis not present

## 2017-01-17 ENCOUNTER — Ambulatory Visit: Payer: Medicare Other | Admitting: Registered Nurse

## 2017-01-22 ENCOUNTER — Encounter: Payer: Medicare Other | Attending: Physical Medicine and Rehabilitation | Admitting: Registered Nurse

## 2017-01-22 ENCOUNTER — Encounter: Payer: Self-pay | Admitting: Registered Nurse

## 2017-01-22 VITALS — BP 133/90 | HR 77

## 2017-01-22 DIAGNOSIS — Z5181 Encounter for therapeutic drug level monitoring: Secondary | ICD-10-CM | POA: Diagnosis not present

## 2017-01-22 DIAGNOSIS — G834 Cauda equina syndrome: Secondary | ICD-10-CM | POA: Insufficient documentation

## 2017-01-22 DIAGNOSIS — M961 Postlaminectomy syndrome, not elsewhere classified: Secondary | ICD-10-CM | POA: Diagnosis not present

## 2017-01-22 DIAGNOSIS — F411 Generalized anxiety disorder: Secondary | ICD-10-CM | POA: Diagnosis not present

## 2017-01-22 DIAGNOSIS — G894 Chronic pain syndrome: Secondary | ICD-10-CM

## 2017-01-22 DIAGNOSIS — Z79899 Other long term (current) drug therapy: Secondary | ICD-10-CM | POA: Diagnosis not present

## 2017-01-22 DIAGNOSIS — G4700 Insomnia, unspecified: Secondary | ICD-10-CM | POA: Diagnosis not present

## 2017-01-22 DIAGNOSIS — G039 Meningitis, unspecified: Secondary | ICD-10-CM | POA: Diagnosis not present

## 2017-01-22 DIAGNOSIS — M62838 Other muscle spasm: Secondary | ICD-10-CM | POA: Diagnosis not present

## 2017-01-22 DIAGNOSIS — M5136 Other intervertebral disc degeneration, lumbar region: Secondary | ICD-10-CM | POA: Diagnosis not present

## 2017-01-22 MED ORDER — OXYCODONE-ACETAMINOPHEN 10-325 MG PO TABS
1.0000 | ORAL_TABLET | Freq: Four times a day (QID) | ORAL | 0 refills | Status: DC | PRN
Start: 2017-01-22 — End: 2017-01-22

## 2017-01-22 MED ORDER — OXYCODONE-ACETAMINOPHEN 10-325 MG PO TABS: 1.0000 | ORAL_TABLET | Freq: Four times a day (QID) | ORAL | 0 refills | Status: DC | PRN

## 2017-01-22 MED ORDER — MORPHINE SULFATE ER 30 MG PO TBCR: 30.0000 mg | EXTENDED_RELEASE_TABLET | Freq: Two times a day (BID) | ORAL | 0 refills | Status: DC

## 2017-01-22 MED ORDER — ALPRAZOLAM 0.25 MG PO TABS: 0.2500 mg | ORAL_TABLET | Freq: Two times a day (BID) | ORAL | 2 refills | Status: DC | PRN

## 2017-01-22 NOTE — Progress Notes (Signed)
Subjective:    Patient ID: Sherry Wong, female    DOB: Aug 07, 1956, 61 y.o.   MRN: [id number]  HPI: [name] is a 61year old female who returns for follow up appointment for chronic pain and medication refill. She states her pain is located in her lower back radiating into her bilateral lower extremities laterally and into her bilateral feet. She rates her pain 5.Her current exercise regime is walking and performing stretching exercises with bands, light weights and light gardening.  Also states yesterday she noticed her left eye had eechymosis, denies any falls or injuries. Instructed to observe and follow up with her PCP. She verbalizes understanding.  Her last UDS was on 09/25/2016, it was consistent. UDS ordered today.   Pain Inventory Average Pain 7 Pain Right Now 5 My pain is sharp, burning, dull, stabbing, tingling and aching  In the last 24 hours, has pain interfered with the following? General activity 8 Relation with others 8 Enjoyment of life 8 What TIME of day is your pain at its worst? all Sleep (in general) Fair  Pain is worse with: walking, bending, standing and some activites Pain improves with: rest, heat/ice and medication Relief from Meds: not answered  Mobility use a cane how many minutes can you walk? 15 ability to climb steps?  yes do you drive?  yes  Function disabled: date disabled 04/30/12 I need assistance with the following:  meal prep, household duties and shopping  Neuro/Psych bladder control problems weakness numbness tingling trouble walking spasms depression anxiety  Prior Studies Any changes since last visit?  no  Physicians involved in your care Any changes since last visit?  no   Family History  Problem Relation Age of Onset  . Anesthesia problems Sister   . Hypotension Neg Hx   . Malignant hyperthermia Neg Hx   . Pseudochol deficiency Neg Hx   . Anesthesia problems Mother    Social History   Social  History  . Marital status: Married    Spouse name: N/A  . Number of children: 0  . Years of education: 12   Occupational History  . Disabled    Social History Main Topics  . Smoking status: Never Smoker  . Smokeless tobacco: Never Used  . Alcohol use 0.0 oz/week     Comment: occ- 0.5 can beer every 2 months  . Drug use: No  . Sexual activity: Not Currently   Other Topics Concern  . None   Social History Narrative   Lives at home with husband.   Caffeine use: 1 cup coffee 3 times per week.    Past Surgical History:  Procedure Laterality Date  . BREAST BIOPSY     right  . BREAST SURGERY     left breast lumpectomy  . CHOLECYSTECTOMY    . COLONOSCOPY    . cysts removed from ovary    . DILATION AND CURETTAGE OF UTERUS    . ESOPHAGOGASTRODUODENOSCOPY    . HEMATOMA EVACUATION N/A 08/25/2013   Procedure: EVACUATION OF LUMBAR HEMATOMA;  Surgeon: Floyce Stakes, MD;  Location: [facility];  Service: Neurosurgery;  Laterality: N/A;  . LUMBAR LAMINECTOMY/DECOMPRESSION MICRODISCECTOMY  12/03/2011   Procedure: LUMBAR LAMINECTOMY/DECOMPRESSION MICRODISCECTOMY 2 LEVELS;  Surgeon: Floyce Stakes, MD;  Location: [place] [facility];  Service: Neurosurgery;  Laterality: Right;  Right Lumbar four-five Lumbar five sacral one Foraminotomies  . LUMBAR WOUND DEBRIDEMENT N/A 09/01/2013   Procedure: LUMBAR WOUND DEBRIDEMENT;  Surgeon: Floyce Stakes,  MD;  Location: [place] [facility];  Service: Neurosurgery;  Laterality: N/A;  . PLACEMENT OF LUMBAR DRAIN N/A 09/01/2013   Procedure: PLACEMENT OF LUMBAR DRAIN;  Surgeon: Floyce Stakes, MD;  Location: [facility];  Service: Neurosurgery;  Laterality: N/A;  . TRANSFORAMINAL LUMBAR INTERBODY FUSION (TLIF) WITH PEDICLE SCREW FIXATION 2 LEVEL N/A 08/25/2013   Procedure:  Lumbar four-five, Lumbar five-Sacral one Transforaminal Lumbar Interbody Fusion ;  Surgeon: Floyce Stakes, MD;  Location: [place] [facility];  Service: Neurosurgery;  Laterality: N/A;   Past  Medical History:  Diagnosis Date  . Anxiety    takes celexa daily  . Arthritis   . Asthma   . Bronchitis   . Chronic back pain    ddd/lumbago,and radiculopathy  . Depression   . Gastric ulcer   . GERD (gastroesophageal reflux disease)    takes Protonix daily  . Gout    hx of  . H/O hiatal hernia   . Hx of colonic polyps   . Hypertension    takes Losartan daily  . Hypothyroidism    takes Levothyroxine daily  . IBS (irritable bowel syndrome)   . Nocturia   . Pneumonia    hx of 15+yrs ago  . PONV (postoperative nausea and vomiting)   . Ulcerative colitis   . Urinary incontinence    BP 133/90   Pulse 77   SpO2 94%   Opioid Risk Score:   Fall Risk Score:  `1  Depression screen PHQ 2/9  Depression screen [facility] 2/9 01/22/2017 05/22/2016 03/20/2016 12/20/2015 05/23/2015 04/25/2015 02/20/2015  Decreased Interest 1 0 0 1 1 1 2   Down, Depressed, Hopeless 1 0 0 1 1 1 3   PHQ - 2 Score 2 0 0 2 2 2 5   Altered sleeping - - - - - - 2  Tired, decreased energy - - - - - - 2  Change in appetite - - - - - - 0  Feeling bad or failure about yourself  - - - - - - 0  Trouble concentrating - - - - - - 0  Moving slowly or fidgety/restless - - - - - - 0  Suicidal thoughts - - - - - - 0  PHQ-9 Score - - - - - - 9    Review of Systems  Constitutional:       Neg  HENT: Negative.   Eyes: Negative.   Respiratory: Negative.   Cardiovascular: Negative.   Gastrointestinal: Positive for constipation and nausea.  Endocrine: Negative.   Genitourinary: Positive for difficulty urinating.  Musculoskeletal: Positive for gait problem.  Skin: Negative.   Allergic/Immunologic: Negative.   Neurological: Positive for weakness and numbness.  Hematological: Negative.   Psychiatric/Behavioral: Positive for dysphoric mood. The patient is nervous/anxious.   All other systems reviewed and are negative.      Objective:   Physical Exam  Constitutional: She is oriented to person, place, and time. She appears  well-developed and well-nourished.  HENT:  Head: Normocephalic and atraumatic.  Neck: Normal range of motion. Neck supple.  Cardiovascular: Normal rate and regular rhythm.   Pulmonary/Chest: Effort normal and breath sounds normal.  Musculoskeletal:  Normal Muscle Bulk and Muscle Testing Reveals: Upper Extremities: Full ROM and Muscle Strength on the Right 5/5 and Left 4/5 Lumbar Hypersensitivity Lower Extremities: Left: Full ROM and Muscle Strength 5/5 Right: Decreased ROM and Muscle Strength 4/5 Right Lower Extremity Flexion Produces Pain into Lumbar and Right Lower Extremity Arises from Table Slowly using  4 Prong Cane Narrow Based Gait   Neurological: She is alert and oriented to person, place, and time.  Skin: Skin is warm and dry.  Psychiatric: She has a normal mood and affect.  Nursing note and vitals reviewed.         Assessment & Plan:  1. Cauda equina syndrome: Incontinence: Wearing Depends:Continue to Monitor. 01/22/2017 2. Insomnia: Continue with Nortriptyline. 01/22/2017 3. 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 01/22/2017 Refilled: Percocet 10/325 mg #120--use 1 tablet every 6 hours as neededand MS Contin 30 mg one tablet every 12 hours as needed #60. We will continue the opioid monitoring program, this consists of regular clinic visits, examinations, urine drug screen, pill counts as well as use of [place] Controlled Substance Reporting System.. 4. Depression: Continue with Lexapro. Continue to Monitor. 01/22/2017 5. Anxiety: Continue with Xanax. Continue to Monitor. 01/22/2017 6.Neuropathy:Continue: Gabapentin. Continue Pamelor. 01/22/2017 7. Constipation: Continue Senokot. 01/22/2017 8.Muscle Spasm: Continue Baclofen. 01/22/2017 9. Bilateral Greater Trochanteric Bursitis: Continue: Alternate Ice and heat therapy. 01/21/2017  20 minutes of face to face patient care time was spent during this visit.  All questions were encouraged and answered.  F/U in 1 month

## 2017-01-28 ENCOUNTER — Telehealth: Payer: Self-pay | Admitting: *Deleted

## 2017-01-28 LAB — TOXASSURE SELECT,+ANTIDEPR,UR

## 2017-01-28 LAB — 6-ACETYLMORPHINE,TOXASSURE ADD
6-ACETYLMORPHINE: NEGATIVE
6-acetylmorphine: NOT DETECTED ng/mg creat

## 2017-01-28 NOTE — Telephone Encounter (Signed)
Urine drug screen for this encounter is consistent for prescribed medication 

## 2017-02-19 ENCOUNTER — Other Ambulatory Visit: Payer: Self-pay | Admitting: Physical Medicine & Rehabilitation

## 2017-03-19 ENCOUNTER — Encounter: Payer: Self-pay | Admitting: Physical Medicine & Rehabilitation

## 2017-03-19 ENCOUNTER — Encounter: Payer: Medicare Other | Attending: Physical Medicine and Rehabilitation | Admitting: Physical Medicine & Rehabilitation

## 2017-03-19 VITALS — BP 116/81 | HR 80 | Resp 14

## 2017-03-19 DIAGNOSIS — Z79899 Other long term (current) drug therapy: Secondary | ICD-10-CM | POA: Insufficient documentation

## 2017-03-19 DIAGNOSIS — G039 Meningitis, unspecified: Secondary | ICD-10-CM | POA: Insufficient documentation

## 2017-03-19 DIAGNOSIS — G834 Cauda equina syndrome: Secondary | ICD-10-CM | POA: Insufficient documentation

## 2017-03-19 DIAGNOSIS — Z5181 Encounter for therapeutic drug level monitoring: Secondary | ICD-10-CM | POA: Diagnosis not present

## 2017-03-19 DIAGNOSIS — M5136 Other intervertebral disc degeneration, lumbar region: Secondary | ICD-10-CM | POA: Insufficient documentation

## 2017-03-19 MED ORDER — MORPHINE SULFATE ER 30 MG PO TBCR: 30.0000 mg | EXTENDED_RELEASE_TABLET | Freq: Two times a day (BID) | ORAL | 0 refills | Status: DC

## 2017-03-19 MED ORDER — OXYCODONE-ACETAMINOPHEN 10-325 MG PO TABS: 1.0000 | ORAL_TABLET | Freq: Four times a day (QID) | ORAL | 0 refills | Status: DC | PRN

## 2017-03-19 MED ORDER — CARBAMAZEPINE 200 MG PO TABS: 200.0000 mg | ORAL_TABLET | Freq: Two times a day (BID) | ORAL | 5 refills | Status: DC

## 2017-03-19 NOTE — Progress Notes (Signed)
Subjective:    Patient ID: Sherry Wong, female    DOB: Jan 16, 1956, 61 y.o.   MRN: [id number]  HPI   Sherry Wong is here in follow up of Sherry Wong cauda equina syndrome. She tries to stay active out in Sherry Wong yard and with walks. She still struggles with constipation at times. She's using senokot-s currently. Sherry Wong pain is predominantly in Sherry Wong back and legs. Sherry Wong and oxycodone have been somewhat effective for Sherry Wong pain along with Sherry Wong gabapentin and pamelor although the pain can be disabling at times. The left leg is generally more affected than the right.   Sherry Wong mood has been fairly good. She has Sherry Wong ups and downs at times as she copes with some of the changes from Sherry Wong injury. She denies spasms.    Pain Inventory Average Pain 7 Pain Right Now 5 My pain is sharp, burning, stabbing, tingling and aching  In the last 24 hours, has pain interfered with the following? General activity 8 Relation with others 8 Enjoyment of life 8 What TIME of day is your pain at its worst? all Sleep (in general) Fair  Pain is worse with: walking, bending, sitting, standing and some activites Pain improves with: rest, heat/ice, pacing activities and medication Relief from Meds: 5  Mobility walk with assistance use a cane how many minutes can you walk? 10-20 ability to climb steps?  yes do you drive?  yes Do you have any goals in this area?  yes  Function disabled: date disabled . I need assistance with the following:  meal prep, household duties and shopping Do you have any goals in this area?  yes  Neuro/Psych bladder control problems bowel control problems weakness numbness tremor tingling trouble walking spasms depression anxiety  Prior Studies Any changes since last visit?  no  Physicians involved in your care Any changes since last visit?  no   Family History  Problem Relation Age of Onset  . Anesthesia problems Mother   . Anesthesia problems Sister   . Hypotension Neg Hx   .  Malignant hyperthermia Neg Hx   . Pseudochol deficiency Neg Hx    Social History   Social History  . Marital status: Married    Spouse name: N/A  . Number of children: 0  . Years of education: 12   Occupational History  . Disabled    Social History Main Topics  . Smoking status: Never Smoker  . Smokeless tobacco: Never Used  . Alcohol use 0.0 oz/week     Comment: occ- 0.5 can beer every 2 months  . Drug use: No  . Sexual activity: Not Currently   Other Topics Concern  . None   Social History Narrative   Lives at home with husband.   Caffeine use: 1 cup coffee 3 times per week.    Past Surgical History:  Procedure Laterality Date  . BREAST BIOPSY     right  . BREAST SURGERY     left breast lumpectomy  . CHOLECYSTECTOMY    . COLONOSCOPY    . cysts removed from ovary    . DILATION AND CURETTAGE OF UTERUS    . ESOPHAGOGASTRODUODENOSCOPY    . HEMATOMA EVACUATION N/A 08/25/2013   Procedure: EVACUATION OF LUMBAR HEMATOMA;  Surgeon: Floyce Stakes, MD;  Location: [facility];  Service: Neurosurgery;  Laterality: N/A;  . LUMBAR LAMINECTOMY/DECOMPRESSION MICRODISCECTOMY  12/03/2011   Procedure: LUMBAR LAMINECTOMY/DECOMPRESSION MICRODISCECTOMY 2 LEVELS;  Surgeon: Floyce Stakes, MD;  Location: [place]  ORS;  Service: Neurosurgery;  Laterality: Right;  Right Lumbar four-five Lumbar five sacral one Foraminotomies  . LUMBAR WOUND DEBRIDEMENT N/A 09/01/2013   Procedure: LUMBAR WOUND DEBRIDEMENT;  Surgeon: Floyce Stakes, MD;  Location: [facility];  Service: Neurosurgery;  Laterality: N/A;  . PLACEMENT OF LUMBAR DRAIN N/A 09/01/2013   Procedure: PLACEMENT OF LUMBAR DRAIN;  Surgeon: Floyce Stakes, MD;  Location: [facility];  Service: Neurosurgery;  Laterality: N/A;  . TRANSFORAMINAL LUMBAR INTERBODY FUSION (TLIF) WITH PEDICLE SCREW FIXATION 2 LEVEL N/A 08/25/2013   Procedure:  Lumbar four-five, Lumbar five-Sacral one Transforaminal Lumbar Interbody Fusion ;  Surgeon: Floyce Stakes, MD;  Location: [place] [facility];  Service: Neurosurgery;  Laterality: N/A;   Past Medical History:  Diagnosis Date  . Anxiety    takes celexa daily  . Arthritis   . Asthma   . Bronchitis   . Chronic back pain    ddd/lumbago,and radiculopathy  . Depression   . Gastric ulcer   . GERD (gastroesophageal reflux disease)    takes Protonix daily  . Gout    hx of  . H/O hiatal hernia   . Hx of colonic polyps   . Hypertension    takes Losartan daily  . Hypothyroidism    takes Levothyroxine daily  . IBS (irritable bowel syndrome)   . Nocturia   . Pneumonia    hx of 15+yrs ago  . PONV (postoperative nausea and vomiting)   . Ulcerative colitis   . Urinary incontinence    BP 116/81 (BP Location: Right Arm, Patient Position: Standing, Cuff Size: Normal)   Pulse 80   Resp 14   SpO2 96%   Opioid Risk Score:   Fall Risk Score:  `1  Depression screen PHQ 2/9  Depression screen [facility] 2/9 01/22/2017 05/22/2016 03/20/2016 12/20/2015 05/23/2015 04/25/2015 02/20/2015  Decreased Interest 1 0 0 1 1 1 2   Down, Depressed, Hopeless 1 0 0 1 1 1 3   PHQ - 2 Score 2 0 0 2 2 2 5   Altered sleeping - - - - - - 2  Tired, decreased energy - - - - - - 2  Change in appetite - - - - - - 0  Feeling bad or failure about yourself  - - - - - - 0  Trouble concentrating - - - - - - 0  Moving slowly or fidgety/restless - - - - - - 0  Suicidal thoughts - - - - - - 0  PHQ-9 Score - - - - - - 9    Review of Systems  HENT: Negative.   Eyes: Negative.   Respiratory: Negative.   Gastrointestinal: Positive for constipation and nausea.  Endocrine: Negative.   Genitourinary: Positive for difficulty urinating, frequency and urgency.  Musculoskeletal: Positive for arthralgias, back pain, gait problem and myalgias.  Skin: Negative.   Allergic/Immunologic: Negative.   Neurological: Positive for tremors, weakness and numbness.       Tingling  Hematological: Negative.   Psychiatric/Behavioral: Positive for  dysphoric mood. The patient is nervous/anxious.        Objective:   Physical Exam  Constitutional: She is oriented to person, place, and time. She appears well-developed and well-nourished.  HENT:  Head: Normocephalic and atraumatic.  Neck: Normal range of motion. Neck supple.  Cardiovascular: RRR.  Pulmonary/Chest:.  Musculoskeletal:   Lower Extremities: Full AROM and Muscle Strength essentially 5/5. May still have slight weakness with ADF/PF. Continued antalgic LLEGait using cane for  balance.   Sensation remains diminished below the waist bilaterally, left more than right.  DTR's remain 1+. Tone normal.  Neurological: She is alert and oriented to person, place, and time.  Skin: Skin is warm and dry.  Psychiatric: mood pleasant, a little flat       Assessment & Plan:  1. Cauda equina syndrome with arachnoiditis: pt with substantial pain and ongoing sensory loss.   -discussed a spinal stimulator which is an option if she should so choose 2. Insomnia: Continue with Trazodone and Xanax  3. DDD L4-S1 s/p diskectomy with decompression of thecal sac: S .  -continue Percocet 10/325 mg #100--1 q6 prn.  -continue Sherry Wong to 30mg  q12 #60  We will continue the opioid monitoring program, this consists of regular clinic visits, examinations, urine drug screen, pill counts as well as use of [place] Controlled Substance Reporting System. [place] today.  4. Depression: Celexa, still some situational depression  5. Anxiety: Continue with Xanax  6. Neuropathic pain: gabapentin 100mg  TID -begin tegretol 200mg  trial titrate to BID. -continue nortriptyline- 50mg  qhs-   7. Fifteen minutes of face to face patient care time was spent during this visit. All questions were encouraged and answered.  Refills were given for next month. Greater than 50% of time during this encounter was spent counseling patient/family in regard to pain mgt options, spinal stimulator.

## 2017-03-19 NOTE — Patient Instructions (Signed)
TRY TWO SENOKOT-S TWICE DAILY METAMUCIL IS FINE TO TAKE DAILY ALSO   CONSIDER MIRALAX

## 2017-03-27 ENCOUNTER — Other Ambulatory Visit: Payer: Self-pay | Admitting: Registered Nurse

## 2017-03-27 DIAGNOSIS — M545 Low back pain: Secondary | ICD-10-CM | POA: Diagnosis not present

## 2017-03-27 DIAGNOSIS — E039 Hypothyroidism, unspecified: Secondary | ICD-10-CM | POA: Diagnosis not present

## 2017-03-27 DIAGNOSIS — M4800 Spinal stenosis, site unspecified: Secondary | ICD-10-CM | POA: Diagnosis not present

## 2017-03-27 DIAGNOSIS — I10 Essential (primary) hypertension: Secondary | ICD-10-CM | POA: Diagnosis not present

## 2017-04-09 DIAGNOSIS — D2361 Other benign neoplasm of skin of right upper limb, including shoulder: Secondary | ICD-10-CM | POA: Diagnosis not present

## 2017-04-09 DIAGNOSIS — J209 Acute bronchitis, unspecified: Secondary | ICD-10-CM | POA: Diagnosis not present

## 2017-05-19 ENCOUNTER — Ambulatory Visit: Payer: Medicare Other | Admitting: Registered Nurse

## 2017-05-21 ENCOUNTER — Encounter: Payer: Self-pay | Admitting: Registered Nurse

## 2017-05-21 ENCOUNTER — Encounter: Payer: Medicare Other | Attending: Physical Medicine and Rehabilitation | Admitting: Registered Nurse

## 2017-05-21 VITALS — BP 129/85 | HR 79 | Resp 14

## 2017-05-21 DIAGNOSIS — Z5181 Encounter for therapeutic drug level monitoring: Secondary | ICD-10-CM | POA: Diagnosis not present

## 2017-05-21 DIAGNOSIS — M7061 Trochanteric bursitis, right hip: Secondary | ICD-10-CM

## 2017-05-21 DIAGNOSIS — M5136 Other intervertebral disc degeneration, lumbar region: Secondary | ICD-10-CM | POA: Insufficient documentation

## 2017-05-21 DIAGNOSIS — G894 Chronic pain syndrome: Secondary | ICD-10-CM | POA: Diagnosis not present

## 2017-05-21 DIAGNOSIS — G4700 Insomnia, unspecified: Secondary | ICD-10-CM

## 2017-05-21 DIAGNOSIS — G834 Cauda equina syndrome: Secondary | ICD-10-CM | POA: Insufficient documentation

## 2017-05-21 DIAGNOSIS — M7062 Trochanteric bursitis, left hip: Secondary | ICD-10-CM

## 2017-05-21 DIAGNOSIS — G039 Meningitis, unspecified: Secondary | ICD-10-CM | POA: Diagnosis not present

## 2017-05-21 DIAGNOSIS — Z79899 Other long term (current) drug therapy: Secondary | ICD-10-CM | POA: Diagnosis not present

## 2017-05-21 DIAGNOSIS — M62838 Other muscle spasm: Secondary | ICD-10-CM

## 2017-05-21 DIAGNOSIS — M961 Postlaminectomy syndrome, not elsewhere classified: Secondary | ICD-10-CM

## 2017-05-21 MED ORDER — MORPHINE SULFATE ER 30 MG PO TBCR: 30.0000 mg | EXTENDED_RELEASE_TABLET | Freq: Two times a day (BID) | ORAL | 0 refills | Status: DC

## 2017-05-21 MED ORDER — BACLOFEN 20 MG PO TABS: 20.0000 mg | ORAL_TABLET | Freq: Three times a day (TID) | ORAL | 2 refills | Status: DC

## 2017-05-21 MED ORDER — ALPRAZOLAM 0.25 MG PO TABS
0.2500 mg | ORAL_TABLET | Freq: Two times a day (BID) | ORAL | 2 refills | Status: DC | PRN
Start: 2017-05-21 — End: 2017-07-24

## 2017-05-21 MED ORDER — OXYCODONE-ACETAMINOPHEN 10-325 MG PO TABS: 1.0000 | ORAL_TABLET | Freq: Four times a day (QID) | ORAL | 0 refills | Status: DC | PRN

## 2017-05-21 MED ORDER — GABAPENTIN 100 MG PO CAPS: 100.0000 mg | ORAL_CAPSULE | Freq: Three times a day (TID) | ORAL | 3 refills | Status: DC

## 2017-05-21 MED ORDER — CARBAMAZEPINE 200 MG PO TABS: 200.0000 mg | ORAL_TABLET | Freq: Two times a day (BID) | ORAL | 5 refills | Status: DC

## 2017-05-21 NOTE — Progress Notes (Signed)
Subjective:    Patient ID: Sherry Wong, female    DOB: 19-Jul-1956, 61 y.o.   MRN: [id number]  HPI: [name] is a 61year old female who returns for follow up appointment for chronic pain and medication refill. She states her pain is located in her lower back radiating into her bilateral hips and bilateral lower extremities laterally. She rates her pain 5.Her current exercise regime is walking and performing stretching exercises with bands, light weights and light gardening.  Her last UDS was on 01/22/2017 , it was consistent.  Pain Inventory Average Pain 7 Pain Right Now 5 My pain is sharp, burning, stabbing, tingling and aching  In the last 24 hours, has pain interfered with the following? General activity 9 Relation with others 9 Enjoyment of life 9 What TIME of day is your pain at its worst? all Sleep (in general) Fair  Pain is worse with: walking, bending, standing and some activites Pain improves with: rest, heat/ice and medication Relief from Meds: no selection  Mobility walk with assistance use a cane how many minutes can you walk? 10-15 ability to climb steps?  yes do you drive?  yes Do you have any goals in this area?  yes  Function disabled: date disabled . I need assistance with the following:  household duties and shopping Do you have any goals in this area?  yes  Neuro/Psych bladder control problems bowel control problems weakness numbness tingling trouble walking spasms dizziness depression anxiety  Prior Studies Any changes since last visit?  no  Physicians involved in your care Any changes since last visit?  no   Family History  Problem Relation Age of Onset  . Anesthesia problems Mother   . Anesthesia problems Sister   . Hypotension Neg Hx   . Malignant hyperthermia Neg Hx   . Pseudochol deficiency Neg Hx    Social History   Social History  . Marital status: Married    Spouse name: N/A  . Number of children: 0    . Years of education: 12   Occupational History  . Disabled    Social History Main Topics  . Smoking status: Never Smoker  . Smokeless tobacco: Never Used  . Alcohol use 0.0 oz/week     Comment: occ- 0.5 can beer every 2 months  . Drug use: No  . Sexual activity: Not Currently   Other Topics Concern  . None   Social History Narrative   Lives at home with husband.   Caffeine use: 1 cup coffee 3 times per week.    Past Surgical History:  Procedure Laterality Date  . BREAST BIOPSY     right  . BREAST SURGERY     left breast lumpectomy  . CHOLECYSTECTOMY    . COLONOSCOPY    . cysts removed from ovary    . DILATION AND CURETTAGE OF UTERUS    . ESOPHAGOGASTRODUODENOSCOPY    . HEMATOMA EVACUATION N/A 08/25/2013   Procedure: EVACUATION OF LUMBAR HEMATOMA;  Surgeon: Floyce Stakes, MD;  Location: [facility];  Service: Neurosurgery;  Laterality: N/A;  . LUMBAR LAMINECTOMY/DECOMPRESSION MICRODISCECTOMY  12/03/2011   Procedure: LUMBAR LAMINECTOMY/DECOMPRESSION MICRODISCECTOMY 2 LEVELS;  Surgeon: Floyce Stakes, MD;  Location: [place] [facility];  Service: Neurosurgery;  Laterality: Right;  Right Lumbar four-five Lumbar five sacral one Foraminotomies  . LUMBAR WOUND DEBRIDEMENT N/A 09/01/2013   Procedure: LUMBAR WOUND DEBRIDEMENT;  Surgeon: Floyce Stakes, MD;  Location: [facility];  Service:  Neurosurgery;  Laterality: N/A;  . PLACEMENT OF LUMBAR DRAIN N/A 09/01/2013   Procedure: PLACEMENT OF LUMBAR DRAIN;  Surgeon: Floyce Stakes, MD;  Location: [facility];  Service: Neurosurgery;  Laterality: N/A;  . TRANSFORAMINAL LUMBAR INTERBODY FUSION (TLIF) WITH PEDICLE SCREW FIXATION 2 LEVEL N/A 08/25/2013   Procedure:  Lumbar four-five, Lumbar five-Sacral one Transforaminal Lumbar Interbody Fusion ;  Surgeon: Floyce Stakes, MD;  Location: [place] [facility];  Service: Neurosurgery;  Laterality: N/A;   Past Medical History:  Diagnosis Date  . Anxiety    takes celexa daily  . Arthritis   .  Asthma   . Bronchitis   . Chronic back pain    ddd/lumbago,and radiculopathy  . Depression   . Gastric ulcer   . GERD (gastroesophageal reflux disease)    takes Protonix daily  . Gout    hx of  . H/O hiatal hernia   . Hx of colonic polyps   . Hypertension    takes Losartan daily  . Hypothyroidism    takes Levothyroxine daily  . IBS (irritable bowel syndrome)   . Nocturia   . Pneumonia    hx of 15+yrs ago  . PONV (postoperative nausea and vomiting)   . Ulcerative colitis   . Urinary incontinence    BP 129/85 (BP Location: Left Arm, Patient Position: Sitting, Cuff Size: Normal)   Pulse 79   Resp 14   SpO2 95%   Opioid Risk Score:   Fall Risk Score:  `1  Depression screen PHQ 2/9  Depression screen [facility] 2/9 01/22/2017 05/22/2016 03/20/2016 12/20/2015 05/23/2015 04/25/2015 02/20/2015  Decreased Interest 1 0 0 1 1 1 2   Down, Depressed, Hopeless 1 0 0 1 1 1 3   PHQ - 2 Score 2 0 0 2 2 2 5   Altered sleeping - - - - - - 2  Tired, decreased energy - - - - - - 2  Change in appetite - - - - - - 0  Feeling bad or failure about yourself  - - - - - - 0  Trouble concentrating - - - - - - 0  Moving slowly or fidgety/restless - - - - - - 0  Suicidal thoughts - - - - - - 0  PHQ-9 Score - - - - - - 9    Review of Systems  Constitutional: Positive for diaphoresis and unexpected weight change.  HENT: Negative.   Eyes: Negative.   Respiratory: Negative.   Cardiovascular: Negative.   Gastrointestinal: Positive for abdominal pain, constipation and nausea.  Endocrine: Negative.   Genitourinary: Positive for difficulty urinating.  Musculoskeletal: Positive for arthralgias, back pain, gait problem and myalgias.       Spasms   Skin: Negative.   Allergic/Immunologic: Negative.   Neurological: Positive for dizziness, weakness and numbness.       Tingling  Hematological: Negative.   Psychiatric/Behavioral: Positive for dysphoric mood. The patient is nervous/anxious.        Objective:    Physical Exam  Constitutional: She is oriented to person, place, and time. She appears well-developed and well-nourished.  HENT:  Head: Normocephalic and atraumatic.  Neck: Normal range of motion. Neck supple.  Cardiovascular: Normal rate and regular rhythm.   Pulmonary/Chest: Effort normal and breath sounds normal.  Musculoskeletal:  Normal Muscle Bulk and Muscle Testing Reveals:  Upper Extremities: Full ROM and Muscle Strength 5/5 Lumbar Paraspinal Tenderness: L-3-L-5 Bilateral Greater Trochanter Tenderness Lower Extremities: Right: Decreased ROM and Muscle Strength 5/5 Left:  Full ROM and Muscle Strength 5/5 Bilateral Lower Extremities Flexion Produces Pain into Lower Extremities Arises from Table Slowly using 4 prong cane  Narrow Based Gait  Neurological: She is alert and oriented to person, place, and time.  Skin: Skin is warm and dry.  Psychiatric: She has a normal mood and affect.  Nursing note and vitals reviewed.         Assessment & Plan:  1. Cauda equina syndrome: Incontinence: Wearing Depends:Continue to Monitor. 05/23/2017 2. Insomnia: Continue with Nortriptyline. 05/23/2017 3. 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 05/23/2017 Refilled: Percocet 10/325 mg #120--use 1 tablet every 6 hours as neededand MS Contin 30 mg one tablet every 12 hours as needed #60. We will continue the opioid monitoring program, this consists of regular clinic visits, examinations, urine drug screen, pill counts as well as use of [place] Controlled Substance Reporting System.. 4. Depression: Continue with Lexapro. Continue to Monitor. 05/23/2017 5. Anxiety: Continue with Xanax. Continue to Monitor. 05/23/2017 6.Neuropathy:Continue: Gabapentin. Continue Pamelor. 05/23/2017 7. Constipation: Continue Senokot. 05/23/2017 8.Muscle Spasm: Continue Baclofen. 05/23/2017 9. Bilateral Greater Trochanteric Bursitis: Continue: Alternate  Ice and heat therapy. 05/23/2017  20 minutes of face to face patient care time was spent during this visit. All questions were encouraged and answered.   F/U in 1 month

## 2017-05-23 ENCOUNTER — Telehealth: Payer: Self-pay | Admitting: Registered Nurse

## 2017-05-23 NOTE — Telephone Encounter (Signed)
On 05/23/2017 the  [place] was reviewed no conflict was seen on the [place] with multiple prescribers. Ms. Cassaro  has a signed narcotic contract with our office. If there were any discrepancies this would have been reported to her physician.

## 2017-07-21 ENCOUNTER — Encounter: Payer: Medicare Other | Admitting: Registered Nurse

## 2017-07-22 DIAGNOSIS — E039 Hypothyroidism, unspecified: Secondary | ICD-10-CM | POA: Diagnosis not present

## 2017-07-22 DIAGNOSIS — K219 Gastro-esophageal reflux disease without esophagitis: Secondary | ICD-10-CM | POA: Diagnosis not present

## 2017-07-22 DIAGNOSIS — I10 Essential (primary) hypertension: Secondary | ICD-10-CM | POA: Diagnosis not present

## 2017-07-22 DIAGNOSIS — E876 Hypokalemia: Secondary | ICD-10-CM | POA: Diagnosis not present

## 2017-07-24 ENCOUNTER — Ambulatory Visit: Payer: Medicare Other | Admitting: Registered Nurse

## 2017-07-24 ENCOUNTER — Encounter: Payer: Self-pay | Admitting: Registered Nurse

## 2017-07-24 ENCOUNTER — Encounter: Payer: Medicare Other | Attending: Physical Medicine and Rehabilitation | Admitting: Registered Nurse

## 2017-07-24 VITALS — BP 122/83 | HR 85

## 2017-07-24 DIAGNOSIS — G834 Cauda equina syndrome: Secondary | ICD-10-CM

## 2017-07-24 DIAGNOSIS — M5136 Other intervertebral disc degeneration, lumbar region: Secondary | ICD-10-CM | POA: Diagnosis not present

## 2017-07-24 DIAGNOSIS — F3289 Other specified depressive episodes: Secondary | ICD-10-CM

## 2017-07-24 DIAGNOSIS — G894 Chronic pain syndrome: Secondary | ICD-10-CM | POA: Diagnosis not present

## 2017-07-24 DIAGNOSIS — M961 Postlaminectomy syndrome, not elsewhere classified: Secondary | ICD-10-CM

## 2017-07-24 DIAGNOSIS — G039 Meningitis, unspecified: Secondary | ICD-10-CM

## 2017-07-24 DIAGNOSIS — F411 Generalized anxiety disorder: Secondary | ICD-10-CM

## 2017-07-24 DIAGNOSIS — Z79899 Other long term (current) drug therapy: Secondary | ICD-10-CM | POA: Diagnosis not present

## 2017-07-24 DIAGNOSIS — Z5181 Encounter for therapeutic drug level monitoring: Secondary | ICD-10-CM

## 2017-07-24 DIAGNOSIS — M62838 Other muscle spasm: Secondary | ICD-10-CM | POA: Diagnosis not present

## 2017-07-24 DIAGNOSIS — M51369 Other intervertebral disc degeneration, lumbar region without mention of lumbar back pain or lower extremity pain: Secondary | ICD-10-CM

## 2017-07-24 MED ORDER — OXYCODONE-ACETAMINOPHEN 10-325 MG PO TABS: 1.0000 | ORAL_TABLET | Freq: Four times a day (QID) | ORAL | 0 refills | Status: DC | PRN

## 2017-07-24 MED ORDER — ALPRAZOLAM 0.25 MG PO TABS: 0.2500 mg | ORAL_TABLET | Freq: Two times a day (BID) | ORAL | 2 refills | Status: DC | PRN

## 2017-07-24 MED ORDER — MORPHINE SULFATE ER 30 MG PO TBCR: 30.0000 mg | EXTENDED_RELEASE_TABLET | Freq: Two times a day (BID) | ORAL | 0 refills | Status: DC

## 2017-07-24 NOTE — Progress Notes (Signed)
Subjective:    Patient ID: Sherry Wong, female    DOB: 04/10/1956, 61 y.o.   MRN: [id number]  HPI: [name] is a 61year old female who returns for follow up appointment for chronic pain and medication refill. She states her pain is located in her lower back radiating into her bilateral lower extremities and bilateral feet. She rates her pain 5.Her current exercise regime is walking and performing stretching exercises with bands and light weights.  Also reports on 05/31/2017 she lost her footing and fell on her knees her husband helped her up. She didn't seek medical attention. Reviewed falls precautions she verbalizes understanding. Instructed to use cane at all times, she verbalizes understanding.   Ms. Dowse Morphine equivalent is  120.00MME. She is also prescribed Xanax. We have discussed the black box warning of using opioids and benzodiazepines.  I highlighted the dangers of using these drugs together and discussed the adverse events including respiratory suppression, overdose, cognitive impairment and importance of  compliance with current regimen.  She verbalizes understanding, we will continue to monitor and adjust as indicated.     Her last UDS was on 01/22/2017 , it was consistent. Oral Swab performed today.   Pain Inventory Average Pain 7 Pain Right Now 5 My pain is sharp, burning, stabbing, tingling and aching  In the last 24 hours, has pain interfered with the following? General activity 8 Relation with others 8 Enjoyment of life 8 What TIME of day is your pain at its worst? all Sleep (in general) Fair  Pain is worse with: walking, bending, standing and some activites Pain improves with: rest, heat/ice and medication Relief from Meds: no selection  Mobility walk with assistance use a cane how many minutes can you walk? 10-15 ability to climb steps?  yes do you drive?  yes Do you have any goals in this area?  yes  Function disabled: date  disabled 04/30/2012 I need assistance with the following:  meal prep, household duties and shopping Do you have any goals in this area?  yes  Neuro/Psych bladder control problems bowel control problems weakness numbness tingling trouble walking spasms dizziness depression anxiety  Prior Studies Any changes since last visit?  no  Physicians involved in your care Any changes since last visit?  no   Family History  Problem Relation Age of Onset  . Anesthesia problems Mother   . Anesthesia problems Sister   . Hypotension Neg Hx   . Malignant hyperthermia Neg Hx   . Pseudochol deficiency Neg Hx    Social History   Social History  . Marital status: Married    Spouse name: N/A  . Number of children: 0  . Years of education: 12   Occupational History  . Disabled    Social History Main Topics  . Smoking status: Never Smoker  . Smokeless tobacco: Never Used  . Alcohol use 0.0 oz/week     Comment: occ- 0.5 can beer every 2 months  . Drug use: No  . Sexual activity: Not Currently   Other Topics Concern  . None   Social History Narrative   Lives at home with husband.   Caffeine use: 1 cup coffee 3 times per week.    Past Surgical History:  Procedure Laterality Date  . BREAST BIOPSY     right  . BREAST SURGERY     left breast lumpectomy  . CHOLECYSTECTOMY    . COLONOSCOPY    . cysts removed from  ovary    . DILATION AND CURETTAGE OF UTERUS    . ESOPHAGOGASTRODUODENOSCOPY    . HEMATOMA EVACUATION N/A 08/25/2013   Procedure: EVACUATION OF LUMBAR HEMATOMA;  Surgeon: Floyce Stakes, MD;  Location: [facility];  Service: Neurosurgery;  Laterality: N/A;  . LUMBAR LAMINECTOMY/DECOMPRESSION MICRODISCECTOMY  12/03/2011   Procedure: LUMBAR LAMINECTOMY/DECOMPRESSION MICRODISCECTOMY 2 LEVELS;  Surgeon: Floyce Stakes, MD;  Location: [place] [facility];  Service: Neurosurgery;  Laterality: Right;  Right Lumbar four-five Lumbar five sacral one Foraminotomies  . LUMBAR WOUND  DEBRIDEMENT N/A 09/01/2013   Procedure: LUMBAR WOUND DEBRIDEMENT;  Surgeon: Floyce Stakes, MD;  Location: [facility];  Service: Neurosurgery;  Laterality: N/A;  . PLACEMENT OF LUMBAR DRAIN N/A 09/01/2013   Procedure: PLACEMENT OF LUMBAR DRAIN;  Surgeon: Floyce Stakes, MD;  Location: [facility];  Service: Neurosurgery;  Laterality: N/A;  . TRANSFORAMINAL LUMBAR INTERBODY FUSION (TLIF) WITH PEDICLE SCREW FIXATION 2 LEVEL N/A 08/25/2013   Procedure:  Lumbar four-five, Lumbar five-Sacral one Transforaminal Lumbar Interbody Fusion ;  Surgeon: Floyce Stakes, MD;  Location: [place] [facility];  Service: Neurosurgery;  Laterality: N/A;   Past Medical History:  Diagnosis Date  . Anxiety    takes celexa daily  . Arthritis   . Asthma   . Bronchitis   . Chronic back pain    ddd/lumbago,and radiculopathy  . Depression   . Gastric ulcer   . GERD (gastroesophageal reflux disease)    takes Protonix daily  . Gout    hx of  . H/O hiatal hernia   . Hx of colonic polyps   . Hypertension    takes Losartan daily  . Hypothyroidism    takes Levothyroxine daily  . IBS (irritable bowel syndrome)   . Nocturia   . Pneumonia    hx of 15+yrs ago  . PONV (postoperative nausea and vomiting)   . Ulcerative colitis   . Urinary incontinence    BP 122/83   Pulse 85   SpO2 93%   Opioid Risk Score:  1 Fall Risk Score:  `1  Depression screen PHQ 2/9  Depression screen [facility] 2/9 07/24/2017 01/22/2017 05/22/2016 03/20/2016 12/20/2015 05/23/2015 04/25/2015  Decreased Interest 2 1 0 0 1 1 1   Down, Depressed, Hopeless 2 1 0 0 1 1 1   PHQ - 2 Score 4 2 0 0 2 2 2   Altered sleeping - - - - - - -  Tired, decreased energy - - - - - - -  Change in appetite - - - - - - -  Feeling bad or failure about yourself  - - - - - - -  Trouble concentrating - - - - - - -  Moving slowly or fidgety/restless - - - - - - -  Suicidal thoughts - - - - - - -  PHQ-9 Score - - - - - - -    Review of Systems  Constitutional:  Positive for diaphoresis and unexpected weight change.  HENT: Negative.   Eyes: Negative.   Respiratory: Negative.   Cardiovascular: Negative.   Gastrointestinal: Positive for abdominal pain, constipation and nausea.  Endocrine: Negative.   Genitourinary: Positive for difficulty urinating.  Musculoskeletal: Positive for arthralgias, back pain, gait problem and myalgias.       Spasms   Skin: Negative.   Allergic/Immunologic: Negative.   Neurological: Positive for dizziness, weakness and numbness.       Tingling  Hematological: Negative.   Psychiatric/Behavioral: Positive for dysphoric  mood. The patient is nervous/anxious.   All other systems reviewed and are negative.      Objective:   Physical Exam  Constitutional: She is oriented to person, place, and time. She appears well-developed and well-nourished.  HENT:  Head: Normocephalic and atraumatic.  Neck: Normal range of motion. Neck supple.  Cardiovascular: Normal rate and regular rhythm.   Pulmonary/Chest: Effort normal and breath sounds normal.  Musculoskeletal:  Normal Muscle Bulk and Muscle Testing Reveals:  Upper Extremities: Full ROM and Muscle Strength 5/5 Lumbar Paraspinal Tenderness: L-4-L-5 Lower Extremities: Full  ROM and Muscle Strength 5/5 Left: Full ROM and Muscle Strength 5/5 Arises from Table Slowly using 4 prong cane  Narrow Based Gait  Neurological: She is alert and oriented to person, place, and time.  Skin: Skin is warm and dry.  Psychiatric: She has a normal mood and affect.  Nursing note and vitals reviewed.         Assessment & Plan:  1. Cauda equina syndrome: Incontinence: Wearing Depends:Continue to Monitor. 07/24/2017 2. Insomnia: Continue with Nortriptyline. 07/24/2017 3. 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 07/24/2017 Refilled: Percocet 10/325 mg #120--use 1 tablet every 6 hours as neededand MS Contin 30 mg one tablet every 12  hours as needed #60. We will continue the opioid monitoring program, this consists of regular clinic visits, examinations, urine drug screen, pill counts as well as use of [place] Controlled Substance Reporting System.. 4. Depression: Continue with Lexapro. Continue to Monitor. 07/24/2017 5. Anxiety: Continue with Xanax. Continue to Monitor. 07/24/2017 6.Neuropathy:Continue: Gabapentin. Continue Pamelor. 05/23/2017 7. Constipation: Continue Senokot. 07/24/2017 8.Muscle Spasm: Continue Baclofen. 07/24/2017   20 minutes of face to face patient care time was spent during this visit. All questions were encouraged and answered.   F/U in 1 month

## 2017-07-28 ENCOUNTER — Other Ambulatory Visit: Payer: Self-pay | Admitting: Physical Medicine & Rehabilitation

## 2017-07-31 DIAGNOSIS — E039 Hypothyroidism, unspecified: Secondary | ICD-10-CM | POA: Diagnosis not present

## 2017-07-31 DIAGNOSIS — Z23 Encounter for immunization: Secondary | ICD-10-CM | POA: Diagnosis not present

## 2017-07-31 DIAGNOSIS — M4800 Spinal stenosis, site unspecified: Secondary | ICD-10-CM | POA: Diagnosis not present

## 2017-07-31 DIAGNOSIS — Z1389 Encounter for screening for other disorder: Secondary | ICD-10-CM | POA: Diagnosis not present

## 2017-07-31 DIAGNOSIS — I10 Essential (primary) hypertension: Secondary | ICD-10-CM | POA: Diagnosis not present

## 2017-07-31 DIAGNOSIS — M545 Low back pain: Secondary | ICD-10-CM | POA: Diagnosis not present

## 2017-07-31 LAB — DRUG TOX MONITOR 1 W/CONF, ORAL FLD
Amphetamines: NEGATIVE ng/mL (ref <10)
Barbiturates: NEGATIVE ng/mL (ref <10)
Benzodiazepines: NEGATIVE ng/mL (ref <0.50)
Buprenorphine: NEGATIVE ng/mL (ref <0.10)
Cocaine: NEGATIVE ng/mL (ref <5.0)
Codeine: NEGATIVE ng/mL (ref <2.5)
Dihydrocodeine: NEGATIVE ng/mL (ref <2.5)
Fentanyl: NEGATIVE ng/mL (ref <0.10)
Heroin Metabolite: NEGATIVE ng/mL (ref <1.0)
Hydrocodone: NEGATIVE ng/mL (ref <2.5)
Hydromorphone: NEGATIVE ng/mL (ref <2.5)
MARIJUANA: NEGATIVE ng/mL (ref <2.5)
MDMA: NEGATIVE ng/mL (ref <10)
Meprobamate: NEGATIVE ng/mL (ref <2.5)
Methadone: NEGATIVE ng/mL (ref <5.0)
Morphine: NEGATIVE ng/mL (ref <2.5)
Nicotine Metabolite: NEGATIVE ng/mL (ref <5.0)
Norhydrocodone: NEGATIVE ng/mL (ref <2.5)
Noroxycodone: 2.7 ng/mL — ABNORMAL HIGH (ref <2.5)
Opiates: POSITIVE ng/mL — AB (ref <2.5)
Oxycodone: 15.4 ng/mL — ABNORMAL HIGH (ref <2.5)
Oxymorphone: NEGATIVE ng/mL (ref <2.5)
Phencyclidine: NEGATIVE ng/mL (ref <10)
Tapentadol: NEGATIVE ng/mL (ref <5.0)
Tramadol: NEGATIVE ng/mL (ref <5.0)
Zolpidem: NEGATIVE ng/mL (ref <5.0)

## 2017-07-31 LAB — DRUG TOX ALC METAB W/CON, ORAL FLD: Alcohol Metabolite: NEGATIVE ng/mL (ref <25)

## 2017-08-11 ENCOUNTER — Telehealth: Payer: Self-pay | Admitting: *Deleted

## 2017-08-11 NOTE — Telephone Encounter (Addendum)
Oral swab drug screen was consistent for prescribed oxycodone medications and metabolite but there was no morphine present even though the last dose was the day of the test. Will repeat swab in December since this is the first oral swab and Morphine was present in urine.[name]

## 2017-08-28 DIAGNOSIS — E039 Hypothyroidism, unspecified: Secondary | ICD-10-CM | POA: Diagnosis not present

## 2017-09-17 ENCOUNTER — Encounter: Payer: Medicare Other | Attending: Physical Medicine and Rehabilitation | Admitting: Physical Medicine & Rehabilitation

## 2017-09-17 ENCOUNTER — Encounter: Payer: Self-pay | Admitting: Physical Medicine & Rehabilitation

## 2017-09-17 VITALS — BP 113/79 | HR 81

## 2017-09-17 DIAGNOSIS — G834 Cauda equina syndrome: Secondary | ICD-10-CM | POA: Diagnosis not present

## 2017-09-17 DIAGNOSIS — Z5181 Encounter for therapeutic drug level monitoring: Secondary | ICD-10-CM | POA: Insufficient documentation

## 2017-09-17 DIAGNOSIS — G039 Meningitis, unspecified: Secondary | ICD-10-CM

## 2017-09-17 DIAGNOSIS — M5136 Other intervertebral disc degeneration, lumbar region: Secondary | ICD-10-CM | POA: Insufficient documentation

## 2017-09-17 DIAGNOSIS — M51369 Other intervertebral disc degeneration, lumbar region without mention of lumbar back pain or lower extremity pain: Secondary | ICD-10-CM

## 2017-09-17 DIAGNOSIS — Z79899 Other long term (current) drug therapy: Secondary | ICD-10-CM | POA: Insufficient documentation

## 2017-09-17 MED ORDER — OXYCODONE-ACETAMINOPHEN 10-325 MG PO TABS: 1.0000 | ORAL_TABLET | Freq: Four times a day (QID) | ORAL | 0 refills | Status: DC | PRN

## 2017-09-17 MED ORDER — MORPHINE SULFATE ER 30 MG PO TBCR: 30.0000 mg | EXTENDED_RELEASE_TABLET | Freq: Two times a day (BID) | ORAL | 0 refills | Status: DC

## 2017-09-17 NOTE — Addendum Note (Signed)
Addended by: Marland Mcalpine B on: 09/17/2017 01:44 PM   Modules accepted: Orders

## 2017-09-17 NOTE — Progress Notes (Signed)
Subjective:    Patient ID: Sherry Wong, female    DOB: September 19, 1956, 61 y.o.   MRN: [id number]  HPI   Sherry Wong is here in follow up of her chronic pain and cauda equina syndrome.  She has been having more problems with her balance and had a fall a couple weeks ago when she was taking out the trash (something she doesn't typically do).  As it turns out she hasn't been moving as much because of the weather and her husband's health. She's been quite sedentary over the last two months. They are planning on getting to the [facility] again starting Friday.   She has ongoing dysesthesias in her legs. The pain levels are stable with her pain medication which includes percocet and ms contin.   She is moving her bowels and bladder fairly regularly. Her mood has been reasonable. Her hsuband remains very supportive.       Pain Inventory Average Pain 7 Pain Right Now 5 My pain is sharp, burning, stabbing, tingling and aching  In the last 24 hours, has pain interfered with the following? General activity 8 Relation with others 8 Enjoyment of life 8 What TIME of day is your pain at its worst? all Sleep (in general) Fair  Pain is worse with: walking, bending, standing and some activites Pain improves with: rest, heat/ice and medication Relief from Meds: 5  Mobility walk with assistance use a walker ability to climb steps?  yes do you drive?  yes  Function disabled: date disabled 2013 I need assistance with the following:  meal prep, household duties and shopping  Neuro/Psych bladder control problems bowel control problems weakness numbness tingling trouble walking spasms depression anxiety  Prior Studies Any changes since last visit?  no  Physicians involved in your care Any changes since last visit?  no   Family History  Problem Relation Age of Onset  . Anesthesia problems Mother   . Anesthesia problems Sister   . Hypotension Neg Hx   . Malignant hyperthermia Neg Hx   .  Pseudochol deficiency Neg Hx    Social History   Socioeconomic History  . Marital status: Married    Spouse name: Not on file  . Number of children: 0  . Years of education: 37  . Highest education level: Not on file  Social Needs  . Financial resource strain: Not on file  . Food insecurity - worry: Not on file  . Food insecurity - inability: Not on file  . Transportation needs - medical: Not on file  . Transportation needs - non-medical: Not on file  Occupational History  . Occupation: Disabled  Tobacco Use  . Smoking status: Never Smoker  . Smokeless tobacco: Never Used  Substance and Sexual Activity  . Alcohol use: Yes    Alcohol/week: 0.0 oz    Comment: occ- 0.5 can beer every 2 months  . Drug use: No  . Sexual activity: Not Currently  Other Topics Concern  . Not on file  Social History Narrative   Lives at home with husband.   Caffeine use: 1 cup coffee 3 times per week.    Past Surgical History:  Procedure Laterality Date  . BREAST BIOPSY     right  . BREAST SURGERY     left breast lumpectomy  . CHOLECYSTECTOMY    . COLONOSCOPY    . cysts removed from ovary    . DILATION AND CURETTAGE OF UTERUS    . ESOPHAGOGASTRODUODENOSCOPY    .  HEMATOMA EVACUATION N/A 08/25/2013   Procedure: EVACUATION OF LUMBAR HEMATOMA;  Surgeon: Floyce Stakes, MD;  Location: [facility];  Service: Neurosurgery;  Laterality: N/A;  . LUMBAR LAMINECTOMY/DECOMPRESSION MICRODISCECTOMY  12/03/2011   Procedure: LUMBAR LAMINECTOMY/DECOMPRESSION MICRODISCECTOMY 2 LEVELS;  Surgeon: Floyce Stakes, MD;  Location: [place] [facility];  Service: Neurosurgery;  Laterality: Right;  Right Lumbar four-five Lumbar five sacral one Foraminotomies  . LUMBAR WOUND DEBRIDEMENT N/A 09/01/2013   Procedure: LUMBAR WOUND DEBRIDEMENT;  Surgeon: Floyce Stakes, MD;  Location: [facility];  Service: Neurosurgery;  Laterality: N/A;  . PLACEMENT OF LUMBAR DRAIN N/A 09/01/2013   Procedure: PLACEMENT OF LUMBAR DRAIN;   Surgeon: Floyce Stakes, MD;  Location: [facility];  Service: Neurosurgery;  Laterality: N/A;  . TRANSFORAMINAL LUMBAR INTERBODY FUSION (TLIF) WITH PEDICLE SCREW FIXATION 2 LEVEL N/A 08/25/2013   Procedure:  Lumbar four-five, Lumbar five-Sacral one Transforaminal Lumbar Interbody Fusion ;  Surgeon: Floyce Stakes, MD;  Location: [place] NEUR[facility] ORS;  Service: Neurosurgery;  Laterality: N/A;   Past Medical History:  Diagnosis Date  . Anxiety    takes celexa daily  . Arthritis   . Asthma   . Bronchitis   . Chronic back pain    ddd/lumbago,and radiculopathy  . Depression   . Gastric ulcer   . GERD (gastroesophageal reflux disease)    takes Protonix daily  . Gout    hx of  . H/O hiatal hernia   . Hx of colonic polyps   . Hypertension    takes Losartan daily  . Hypothyroidism    takes Levothyroxine daily  . IBS (irritable bowel syndrome)   . Nocturia   . Pneumonia    hx of 15+yrs ago  . PONV (postoperative nausea and vomiting)   . Ulcerative colitis   . Urinary incontinence    There were no vitals taken for this visit.  Opioid Risk Score:   Fall Risk Score:  `1  Depression screen PHQ 2/9  Depression screen [facility] 2/9 07/24/2017 01/22/2017 05/22/2016 03/20/2016 12/20/2015 05/23/2015 04/25/2015  Decreased Interest 2 1 0 0 1 1 1   Down, Depressed, Hopeless 2 1 0 0 1 1 1   PHQ - 2 Score 4 2 0 0 2 2 2   Altered sleeping - - - - - - -  Tired, decreased energy - - - - - - -  Change in appetite - - - - - - -  Feeling bad or failure about yourself  - - - - - - -  Trouble concentrating - - - - - - -  Moving slowly or fidgety/restless - - - - - - -  Suicidal thoughts - - - - - - -  PHQ-9 Score - - - - - - -     Review of Systems  Constitutional: Negative.   HENT: Negative.   Eyes: Negative.   Respiratory: Positive for apnea.   Cardiovascular: Negative.   Gastrointestinal: Positive for abdominal pain and constipation.  Endocrine: Negative.   Genitourinary: Positive for difficulty  urinating.  Musculoskeletal: Negative.   Skin: Negative.   Allergic/Immunologic: Negative.   Hematological: Negative.   Psychiatric/Behavioral: Negative.   All other systems reviewed and are negative.      Objective:   Physical Exam  Constitutional: She is oriented to person, place, and time. She appears well-developed and well-nourished.  HENT:  Head: Normocephalic and atraumatic.  Neck: Normal range of motion. Neck supple.  Cardiovascular: RRR  Pulmonary/Chest:. Normal effort Musculoskeletal:  Lower Extremities: Full AROM and Muscle Strength remains 5/5 in all 4's. vContinued antalgic LLE, wide based. Gait using cane for balance.  Sensation remainsdiminished below the waist bilaterally to light touch and pain.   DTR's remain1+. Tone normal throughout. [name]  Neurological: She is alert and oriented to person, place, and time.  Skin: Skin is warm and dry.  Psychiatric: mood pleasant, a little flat      Assessment & Plan:  1. Cauda equina syndrome with arachnoiditis: pt with substantial pain and ongoing sensory loss.              -have reviewed a spinal stimulator which is an option if she should so choose  -?caudal block  -reviewed a HEP as well which she could potentially follow. Staying active is key 2. Insomnia: Continue with Trazodone and Xanax  3. DDD L4-S1 s/p diskectomy with decompression of thecal sac:  -continue Percocet 10/325 mg #100--1 q6 prn.  -continue ms contin to 30mg  q12 #60 , rx'es provided for next month.  We will continue the opioid monitoring program, this consists of regular clinic visits, examinations, routine drug screening, pill counts as well as use of [place] Controlled Substance Reporting System. [place] today.   -drug swab testing today  4. Depression: Celexa, still some situational depression  5. Anxiety: Continue with Xanax  6. Neuropathic pain: gabapentin 100mg  TID -continue nortriptyline- 50mg  qhs-  7.45minutes of  face to face patient care time was spent during this visit. All questions were encouraged and answered.  follow up in 2 months. Greater than 50% of time during this encounter was spent counseling patient/family in regard to home exercise program and importance of such.

## 2017-09-17 NOTE — Patient Instructions (Signed)
MAINTAIN ACTIVITY EVERY DAY. MAKE A SCHEDULE TO HELP YOU KEEP TRACK   PLEASE FEEL FREE TO CALL OUR OFFICE WITH ANY PROBLEMS OR QUESTIONS ([phone])  HAVE A HAPPY HOLIDAYS!                     ^                  ^^                ^ ^ ^             ^ ^ ^ ^ ^           ^ ^ ^ ^ ^ ^ ^        ^ ^ ^ [place]      [place] [name]                [name]                [name]                [name]

## 2017-09-22 LAB — DRUG TOX MONITOR 1 W/CONF, ORAL FLD
Amphetamines: NEGATIVE ng/mL (ref <10)
Barbiturates: NEGATIVE ng/mL (ref <10)
Benzodiazepines: NEGATIVE ng/mL (ref <0.50)
Buprenorphine: NEGATIVE ng/mL (ref <0.10)
Cocaine: NEGATIVE ng/mL (ref <5.0)
Codeine: NEGATIVE ng/mL (ref <2.5)
Dihydrocodeine: NEGATIVE ng/mL (ref <2.5)
Fentanyl: NEGATIVE ng/mL (ref <0.10)
Heroin Metabolite: NEGATIVE ng/mL (ref <1.0)
Hydrocodone: NEGATIVE ng/mL (ref <2.5)
Hydromorphone: NEGATIVE ng/mL (ref <2.5)
MARIJUANA: NEGATIVE ng/mL (ref <2.5)
MDMA: NEGATIVE ng/mL (ref <10)
Meprobamate: NEGATIVE ng/mL (ref <2.5)
Methadone: NEGATIVE ng/mL (ref <5.0)
Morphine: NEGATIVE ng/mL (ref <2.5)
Nicotine Metabolite: NEGATIVE ng/mL (ref <5.0)
Norhydrocodone: NEGATIVE ng/mL (ref <2.5)
Noroxycodone: 17.9 ng/mL — ABNORMAL HIGH (ref <2.5)
Opiates: POSITIVE ng/mL — AB (ref <2.5)
Oxycodone: 89.3 ng/mL — ABNORMAL HIGH (ref <2.5)
Oxymorphone: NEGATIVE ng/mL (ref <2.5)
Phencyclidine: NEGATIVE ng/mL (ref <10)
Tapentadol: NEGATIVE ng/mL (ref <5.0)
Tramadol: NEGATIVE ng/mL (ref <5.0)
Zolpidem: NEGATIVE ng/mL (ref <5.0)

## 2017-09-22 LAB — DRUG TOX ALC METAB W/CON, ORAL FLD: Alcohol Metabolite: NEGATIVE ng/mL (ref <25)

## 2017-09-24 ENCOUNTER — Other Ambulatory Visit: Payer: Self-pay | Admitting: Registered Nurse

## 2017-10-06 ENCOUNTER — Telehealth: Payer: Self-pay | Admitting: *Deleted

## 2017-10-06 NOTE — Telephone Encounter (Signed)
Oral swab drug screen was consistent for prescribed medications.  ?

## 2017-10-29 ENCOUNTER — Other Ambulatory Visit: Payer: Self-pay

## 2017-10-29 MED ORDER — NORTRIPTYLINE HCL 50 MG PO CAPS: ORAL_CAPSULE | ORAL | 2 refills | Status: DC

## 2017-11-03 ENCOUNTER — Telehealth: Payer: Self-pay | Admitting: Physical Medicine & Rehabilitation

## 2017-11-03 NOTE — Telephone Encounter (Signed)
Pt phoned stating she has pain in her big toe that will not go away. Pt stated her pain meds were not helping. She requested that you call her at your convenience. Pt did made an appointment to see you on 11/05/17. Please advise.

## 2017-11-04 NOTE — Telephone Encounter (Signed)
Returned Ms. Warr call, no answer left message to return the call

## 2017-11-05 ENCOUNTER — Encounter: Payer: Medicare Other | Admitting: Registered Nurse

## 2017-11-18 ENCOUNTER — Encounter: Payer: Medicare Other | Attending: Physical Medicine and Rehabilitation | Admitting: Physical Medicine & Rehabilitation

## 2017-11-18 ENCOUNTER — Encounter: Payer: Self-pay | Admitting: Physical Medicine & Rehabilitation

## 2017-11-18 DIAGNOSIS — G039 Meningitis, unspecified: Secondary | ICD-10-CM | POA: Insufficient documentation

## 2017-11-18 DIAGNOSIS — G834 Cauda equina syndrome: Secondary | ICD-10-CM | POA: Diagnosis not present

## 2017-11-18 DIAGNOSIS — Z5181 Encounter for therapeutic drug level monitoring: Secondary | ICD-10-CM | POA: Diagnosis not present

## 2017-11-18 DIAGNOSIS — Z79899 Other long term (current) drug therapy: Secondary | ICD-10-CM | POA: Diagnosis not present

## 2017-11-18 DIAGNOSIS — M5136 Other intervertebral disc degeneration, lumbar region: Secondary | ICD-10-CM

## 2017-11-18 MED ORDER — OXYCODONE-ACETAMINOPHEN 10-325 MG PO TABS: 1.0000 | ORAL_TABLET | Freq: Four times a day (QID) | ORAL | 0 refills | Status: DC | PRN

## 2017-11-18 MED ORDER — MORPHINE SULFATE ER 30 MG PO TBCR: 30.0000 mg | EXTENDED_RELEASE_TABLET | Freq: Two times a day (BID) | ORAL | 0 refills | Status: DC

## 2017-11-18 NOTE — Patient Instructions (Signed)

## 2017-11-18 NOTE — Progress Notes (Signed)
Subjective:    Patient ID: Sherry Wong, female    DOB: [birth year], 62 y.o.   MRN: [id number]  HPI   Sherry Wong is here in folllow up of her chronic pain and cauda equina. She has been exercising more, going to the [facility] most days. She has noticed a big difference in her mood and energy levels.  She also notes that her pain is improved.  She makes an effort to exercise just about every day now.  She likes [place].  She thinks that she might have had another gout attack which lasted a few days.  When it flared she was unable to put weight on her right foot.  For pain in general, she is using MS Contin 30 mg every 12 hours with Percocet 10/325 for breakthrough pain.  Bowel and bladder function remain regulated.      Pain Inventory Average Pain 7 Pain Right Now 6 My pain is sharp, burning, stabbing, tingling and aching  In the last 24 hours, has pain interfered with the following? General activity 8 Relation with others 8 Enjoyment of life 8 What TIME of day is your pain at its worst? All Sleep (in general) Fair  Pain is worse with: walking, bending, standing and some activites Pain improves with: rest, heat/ice and medication Relief from Meds: 0  Mobility walk with assistance use a cane how many minutes can you walk? 10-15 ability to climb steps?  yes do you drive?  yes Do you have any goals in this area?  yes  Function not employed: date last employed 12/03/2011 disabled: date disabled 04/30/2012 I need assistance with the following:  meal prep, household duties and shopping Do you have any goals in this area?  yes  Neuro/Psych bladder control problems bowel control problems weakness numbness tremor tingling trouble walking spasms depression anxiety  Prior Studies Any changes since last visit?  no  Physicians involved in your care Primary care [facility] Neurologist Dr. Jaynee Eagles Neurosurgeon Stacie Glaze   Family History    Problem Relation Age of Onset  . Anesthesia problems Mother   . Anesthesia problems Sister   . Hypotension Neg Hx   . Malignant hyperthermia Neg Hx   . Pseudochol deficiency Neg Hx    Social History   Socioeconomic History  . Marital status: Married    Spouse name: Not on file  . Number of children: 0  . Years of education: 35  . Highest education level: Not on file  Social Needs  . Financial resource strain: Not on file  . Food insecurity - worry: Not on file  . Food insecurity - inability: Not on file  . Transportation needs - medical: Not on file  . Transportation needs - non-medical: Not on file  Occupational History  . Occupation: Disabled  Tobacco Use  . Smoking status: Never Smoker  . Smokeless tobacco: Never Used  Substance and Sexual Activity  . Alcohol use: Yes    Alcohol/week: 0.0 oz    Comment: occ- 0.5 can beer every 2 months  . Drug use: No  . Sexual activity: Not Currently  Other Topics Concern  . Not on file  Social History Narrative   Lives at home with husband.   Caffeine use: 1 cup coffee 3 times per week.    Past Surgical History:  Procedure Laterality Date  . BREAST BIOPSY     right  . BREAST SURGERY     left  breast lumpectomy  . CHOLECYSTECTOMY    . COLONOSCOPY    . cysts removed from ovary    . DILATION AND CURETTAGE OF UTERUS    . ESOPHAGOGASTRODUODENOSCOPY    . HEMATOMA EVACUATION N/A 08/25/2013   Procedure: EVACUATION OF LUMBAR HEMATOMA;  Surgeon: Floyce Stakes, MD;  Location: [facility];  Service: Neurosurgery;  Laterality: N/A;  . LUMBAR LAMINECTOMY/DECOMPRESSION MICRODISCECTOMY  12/03/2011   Procedure: LUMBAR LAMINECTOMY/DECOMPRESSION MICRODISCECTOMY 2 LEVELS;  Surgeon: Floyce Stakes, MD;  Location: [place] NEURO ORS;  Service: Neurosurgery;  Laterality: Right;  Right Lumbar four-five Lumbar five sacral one Foraminotomies  . LUMBAR WOUND DEBRIDEMENT N/A 09/01/2013   Procedure: LUMBAR WOUND DEBRIDEMENT;  Surgeon: Floyce Stakes,  MD;  Location: [facility];  Service: Neurosurgery;  Laterality: N/A;  . PLACEMENT OF LUMBAR DRAIN N/A 09/01/2013   Procedure: PLACEMENT OF LUMBAR DRAIN;  Surgeon: Floyce Stakes, MD;  Location: [facility];  Service: Neurosurgery;  Laterality: N/A;  . TRANSFORAMINAL LUMBAR INTERBODY FUSION (TLIF) WITH PEDICLE SCREW FIXATION 2 LEVEL N/A 08/25/2013   Procedure:  Lumbar four-five, Lumbar five-Sacral one Transforaminal Lumbar Interbody Fusion ;  Surgeon: Floyce Stakes, MD;  Location: [place] NEURO ORS;  Service: Neurosurgery;  Laterality: N/A;   Past Medical History:  Diagnosis Date  . Anxiety    takes celexa daily  . Arthritis   . Asthma   . Bronchitis   . Chronic back pain    ddd/lumbago,and radiculopathy  . Depression   . Gastric ulcer   . GERD (gastroesophageal reflux disease)    takes Protonix daily  . Gout    hx of  . H/O hiatal hernia   . Hx of colonic polyps   . Hypertension    takes Losartan daily  . Hypothyroidism    takes Levothyroxine daily  . IBS (irritable bowel syndrome)   . Nocturia   . Pneumonia    hx of 15+yrs ago  . PONV (postoperative nausea and vomiting)   . Ulcerative colitis   . Urinary incontinence    There were no vitals taken for this visit.  Opioid Risk Score:   Fall Risk Score:  `1  Depression screen PHQ 2/9  Depression screen [facility] 2/9 07/24/2017 01/22/2017 05/22/2016 03/20/2016 12/20/2015 05/23/2015 04/25/2015  Decreased Interest 2 1 0 0 1 1 1   Down, Depressed, Hopeless 2 1 0 0 1 1 1   PHQ - 2 Score 4 2 0 0 2 2 2   Altered sleeping - - - - - - -  Tired, decreased energy - - - - - - -  Change in appetite - - - - - - -  Feeling bad or failure about yourself  - - - - - - -  Trouble concentrating - - - - - - -  Moving slowly or fidgety/restless - - - - - - -  Suicidal thoughts - - - - - - -  PHQ-9 Score - - - - - - -     Review of Systems  Respiratory: Positive for shortness of breath and wheezing.   Gastrointestinal: Positive for abdominal  pain, constipation and nausea.       Bowel control problem   Genitourinary: Positive for difficulty urinating.       Bladder control problems   Neurological: Positive for tremors, weakness and numbness.       Tingling Spasms   Psychiatric/Behavioral: Positive for dysphoric mood. The patient is nervous/anxious.   All other systems reviewed and are  negative.      Objective:   Physical Exam Constitutional: She is oriented to person, place, and time.  Remains overweight HENT:  Head: Normocephalic and atraumatic.  Neck: Normal range of motion. Neck supple.  Cardiovascular: RRR  Pulmonary/Chest:. normal effort Musculoskeletal:  Lower Extremities: Full AROM and Muscle Strengthremains 5/5 in all 4's. vContinued antalgicLLE, wide based. Gait using cane for balance.Sensation remainsdiminished below the waist bilaterally to light touch and pain--stable. DTR's remain1+.normal tone . Neurological: She is alert and oriented to person, place, and time.  Skin: Skin is warm and dry.  Psychiatric: mood pleasant and more up beat    Assessment & Plan:  1. Cauda equina syndrome with arachnoiditis: pt with substantial pain and ongoing sensory loss. -have reviewed a spinal stimulator which is an option if she should so choose            -?caudal block is still an option.             -continue with HEP.  It is great that she has seen some of the benefits of increasing her exercise.  I think it has been good for her husband as well.  She will continue along these lines. 2. Insomnia: Continue with Trazodone and Xanax  3. DDD L4-S1 s/p diskectomy with decompression of thecal sac:  -continuePercocet 10/325 mg #100--1 q6 prn.  -continue ms contin to 30mg  q12 #60, rx'es provided for next month.  We will continue the opioid monitoring program, this consists of regular clinic visits, examinations, routine drug screening, pill counts as well as use of [place] Controlled  Substance Reporting System. [place] today.  .    4. Depression/anxiety:Celexa, xanax 5. Gout? ---information provided. Discussed treatment options and importance of diet as well as potential precipitating factors. 6. Neuropathic pain:gabapentin 100mg  TID -continuenortriptyline- 50mg  qhs-  7.64minutes of face to face patient care time was spent during this visit. All questions were encouraged and answered. Follow up in 2 months. Greater than 50% of time during this encounter was spent counseling patient/family in regard to gout and mgt.

## 2017-12-01 DIAGNOSIS — E039 Hypothyroidism, unspecified: Secondary | ICD-10-CM | POA: Diagnosis not present

## 2017-12-01 DIAGNOSIS — K219 Gastro-esophageal reflux disease without esophagitis: Secondary | ICD-10-CM | POA: Diagnosis not present

## 2017-12-01 DIAGNOSIS — I10 Essential (primary) hypertension: Secondary | ICD-10-CM | POA: Diagnosis not present

## 2017-12-01 DIAGNOSIS — E876 Hypokalemia: Secondary | ICD-10-CM | POA: Diagnosis not present

## 2017-12-09 DIAGNOSIS — I10 Essential (primary) hypertension: Secondary | ICD-10-CM | POA: Diagnosis not present

## 2017-12-09 DIAGNOSIS — K580 Irritable bowel syndrome with diarrhea: Secondary | ICD-10-CM | POA: Diagnosis not present

## 2017-12-09 DIAGNOSIS — E039 Hypothyroidism, unspecified: Secondary | ICD-10-CM | POA: Diagnosis not present

## 2017-12-09 DIAGNOSIS — K219 Gastro-esophageal reflux disease without esophagitis: Secondary | ICD-10-CM | POA: Diagnosis not present

## 2017-12-09 DIAGNOSIS — M4800 Spinal stenosis, site unspecified: Secondary | ICD-10-CM | POA: Diagnosis not present

## 2017-12-24 ENCOUNTER — Other Ambulatory Visit: Payer: Self-pay | Admitting: Registered Nurse

## 2018-01-15 ENCOUNTER — Ambulatory Visit: Payer: Medicare Other | Admitting: Registered Nurse

## 2018-01-21 ENCOUNTER — Encounter: Payer: Self-pay | Admitting: Registered Nurse

## 2018-01-21 ENCOUNTER — Encounter: Payer: Medicare Other | Attending: Physical Medicine and Rehabilitation | Admitting: Registered Nurse

## 2018-01-21 VITALS — BP 118/80 | HR 80 | Ht 67.0 in | Wt 238.0 lb

## 2018-01-21 DIAGNOSIS — Z5181 Encounter for therapeutic drug level monitoring: Secondary | ICD-10-CM | POA: Insufficient documentation

## 2018-01-21 DIAGNOSIS — G039 Meningitis, unspecified: Secondary | ICD-10-CM | POA: Insufficient documentation

## 2018-01-21 DIAGNOSIS — G894 Chronic pain syndrome: Secondary | ICD-10-CM

## 2018-01-21 DIAGNOSIS — F411 Generalized anxiety disorder: Secondary | ICD-10-CM | POA: Diagnosis not present

## 2018-01-21 DIAGNOSIS — M7061 Trochanteric bursitis, right hip: Secondary | ICD-10-CM

## 2018-01-21 DIAGNOSIS — Z79899 Other long term (current) drug therapy: Secondary | ICD-10-CM | POA: Insufficient documentation

## 2018-01-21 DIAGNOSIS — M5136 Other intervertebral disc degeneration, lumbar region: Secondary | ICD-10-CM | POA: Diagnosis not present

## 2018-01-21 DIAGNOSIS — G834 Cauda equina syndrome: Secondary | ICD-10-CM | POA: Diagnosis not present

## 2018-01-21 DIAGNOSIS — M5416 Radiculopathy, lumbar region: Secondary | ICD-10-CM

## 2018-01-21 DIAGNOSIS — M961 Postlaminectomy syndrome, not elsewhere classified: Secondary | ICD-10-CM

## 2018-01-21 DIAGNOSIS — F3289 Other specified depressive episodes: Secondary | ICD-10-CM | POA: Diagnosis not present

## 2018-01-21 DIAGNOSIS — G4700 Insomnia, unspecified: Secondary | ICD-10-CM

## 2018-01-21 DIAGNOSIS — M62838 Other muscle spasm: Secondary | ICD-10-CM | POA: Diagnosis not present

## 2018-01-21 DIAGNOSIS — M7062 Trochanteric bursitis, left hip: Secondary | ICD-10-CM

## 2018-01-21 MED ORDER — OXYCODONE-ACETAMINOPHEN 10-325 MG PO TABS: 1.0000 | ORAL_TABLET | Freq: Four times a day (QID) | ORAL | 0 refills | Status: DC | PRN

## 2018-01-21 MED ORDER — MORPHINE SULFATE ER 30 MG PO TBCR: 30.0000 mg | EXTENDED_RELEASE_TABLET | Freq: Two times a day (BID) | ORAL | 0 refills | Status: DC

## 2018-01-21 MED ORDER — BACLOFEN 20 MG PO TABS: 20.0000 mg | ORAL_TABLET | Freq: Three times a day (TID) | ORAL | 2 refills | Status: DC

## 2018-01-21 MED ORDER — ALPRAZOLAM 0.25 MG PO TABS: 0.2500 mg | ORAL_TABLET | Freq: Two times a day (BID) | ORAL | 2 refills | Status: DC | PRN

## 2018-01-21 NOTE — Progress Notes (Signed)
Subjective:    Patient ID: Sherry Wong, female    DOB: 10-03-1955, 62 y.o.   MRN: [id number]  HPI:  Sherry Wong is a 62 year old female who returns for follow up appointment for chronic pain and medication refill. She states her pain is located in her lower back radiating into her bilateral lower extremities and bilateral feet. Also reports she has bilateral hip pain. She rates her pain 5. Her current exercise regime is walking and riding stationary bicycle for 40 minutes three days a week.  [name] Morphine Equivalent is 128.00 MME. She is also  Prescribed Alprazolam, last prescription was filled on 11/24/2017 according to the PMP Aware Website. We have discussed the black box warning of using opioids and benzodiazepines. highlighted the dangers of using these drugs together and discussed the adverse events including respiratory suppression, overdose, cognitive impairment and importance of  compliance with current regimen. She verbalizes understanding, we will continue to monitor and adjust as indicated.     [name] states her husband is hospitalized at [facility], she reports he has been hospitalized 4 times in the last five weeks. Very tearful, emotional support given.   Pain Inventory Average Pain 6 Pain Right Now 5 My pain is burning, stabbing and aching  In the last 24 hours, has pain interfered with the following? General activity 8 Relation with others 8 Enjoyment of life 8 What TIME of day is your pain at its worst? all Sleep (in general) Fair  Pain is worse with: walking, bending, sitting, standing and some activites Pain improves with: rest, heat/ice and medication Relief from Meds: 5  Mobility walk with assistance use a cane ability to climb steps?  yes do you drive?  yes  Function disabled: date disabled 2014  Neuro/Psych bladder control problems bowel control problems weakness numbness tingling trouble walking depression  Prior  Studies Any changes since last visit?  no  Physicians involved in your care Any changes since last visit?  no   Family History  Problem Relation Age of Onset  . Anesthesia problems Mother   . Anesthesia problems Sister   . Hypotension Neg Hx   . Malignant hyperthermia Neg Hx   . Pseudochol deficiency Neg Hx    Social History   Socioeconomic History  . Marital status: Married    Spouse name: Not on file  . Number of children: 0  . Years of education: 32  . Highest education level: Not on file  Occupational History  . Occupation: Disabled  Social Needs  . Financial resource strain: Not on file  . Food insecurity:    Worry: Not on file    Inability: Not on file  . Transportation needs:    Medical: Not on file    Non-medical: Not on file  Tobacco Use  . Smoking status: Never Smoker  . Smokeless tobacco: Never Used  Substance and Sexual Activity  . Alcohol use: Yes    Alcohol/week: 0.0 oz    Comment: occ- 0.5 can beer every 2 months  . Drug use: No  . Sexual activity: Not Currently  Lifestyle  . Physical activity:    Days per week: Not on file    Minutes per session: Not on file  . Stress: Not on file  Relationships  . Social connections:    Talks on phone: Not on file    Gets together: Not on file    Attends religious service: Not on file  Active member of club or organization: Not on file    Attends meetings of clubs or organizations: Not on file    Relationship status: Not on file  Other Topics Concern  . Not on file  Social History Narrative   Lives at home with husband.   Caffeine use: 1 cup coffee 3 times per week.    Past Surgical History:  Procedure Laterality Date  . BREAST BIOPSY     right  . BREAST SURGERY     left breast lumpectomy  . CHOLECYSTECTOMY    . COLONOSCOPY    . cysts removed from ovary    . DILATION AND CURETTAGE OF UTERUS    . ESOPHAGOGASTRODUODENOSCOPY    . HEMATOMA EVACUATION N/A 08/25/2013   Procedure: EVACUATION OF  LUMBAR HEMATOMA;  Surgeon: Floyce Stakes, MD;  Location: [facility];  Service: Neurosurgery;  Laterality: N/A;  . LUMBAR LAMINECTOMY/DECOMPRESSION MICRODISCECTOMY  12/03/2011   Procedure: LUMBAR LAMINECTOMY/DECOMPRESSION MICRODISCECTOMY 2 LEVELS;  Surgeon: Floyce Stakes, MD;  Location: [place] [facility];  Service: Neurosurgery;  Laterality: Right;  Right Lumbar four-five Lumbar five sacral one Foraminotomies  . LUMBAR WOUND DEBRIDEMENT N/A 09/01/2013   Procedure: LUMBAR WOUND DEBRIDEMENT;  Surgeon: Floyce Stakes, MD;  Location: [facility];  Service: Neurosurgery;  Laterality: N/A;  . PLACEMENT OF LUMBAR DRAIN N/A 09/01/2013   Procedure: PLACEMENT OF LUMBAR DRAIN;  Surgeon: Floyce Stakes, MD;  Location: [facility];  Service: Neurosurgery;  Laterality: N/A;  . TRANSFORAMINAL LUMBAR INTERBODY FUSION (TLIF) WITH PEDICLE SCREW FIXATION 2 LEVEL N/A 08/25/2013   Procedure:  Lumbar four-five, Lumbar five-Sacral one Transforaminal Lumbar Interbody Fusion ;  Surgeon: Floyce Stakes, MD;  Location: [place] [facility];  Service: Neurosurgery;  Laterality: N/A;   Past Medical History:  Diagnosis Date  . Anxiety    takes celexa daily  . Arthritis   . Asthma   . Bronchitis   . Chronic back pain    ddd/lumbago,and radiculopathy  . Depression   . Gastric ulcer   . GERD (gastroesophageal reflux disease)    takes Protonix daily  . Gout    hx of  . H/O hiatal hernia   . Hx of colonic polyps   . Hypertension    takes Losartan daily  . Hypothyroidism    takes Levothyroxine daily  . IBS (irritable bowel syndrome)   . Nocturia   . Pneumonia    hx of 15+yrs ago  . PONV (postoperative nausea and vomiting)   . Ulcerative colitis   . Urinary incontinence    BP 118/80   Pulse 80   Ht 5\' 7"  (1.702 m)   Wt 238 lb (108 kg)   SpO2 94%   BMI 37.28 kg/m   Opioid Risk Score:   Fall Risk Score:  `1  Depression screen PHQ 2/9  Depression screen [facility] 2/9 07/24/2017 01/22/2017 05/22/2016 03/20/2016  12/20/2015 05/23/2015 04/25/2015  Decreased Interest 2 1 0 0 1 1 1   Down, Depressed, Hopeless 2 1 0 0 1 1 1   PHQ - 2 Score 4 2 0 0 2 2 2   Altered sleeping - - - - - - -  Tired, decreased energy - - - - - - -  Change in appetite - - - - - - -  Feeling bad or failure about yourself  - - - - - - -  Trouble concentrating - - - - - - -  Moving slowly or fidgety/restless - - - - - - -  Suicidal thoughts - - - - - - -  PHQ-9 Score - - - - - - -     Review of Systems  Constitutional: Negative.   HENT: Negative.   Eyes: Negative.   Respiratory: Negative.   Cardiovascular: Negative.   Gastrointestinal: Positive for abdominal pain, constipation and nausea.  Endocrine: Negative.   Genitourinary: Positive for difficulty urinating.  Musculoskeletal: Positive for arthralgias, back pain, gait problem and myalgias.  Skin: Negative.   Allergic/Immunologic: Negative.   Hematological: Negative.   Psychiatric/Behavioral: Negative.   All other systems reviewed and are negative.      Objective:   Physical Exam  Constitutional: She is oriented to person, place, and time. She appears well-developed and well-nourished.  HENT:  Head: Normocephalic and atraumatic.  Neck: Normal range of motion. Neck supple.  Cardiovascular: Normal rate and regular rhythm.  Pulmonary/Chest: Effort normal and breath sounds normal.  Musculoskeletal:  Normal Muscle Bulk and Muscle Testing Reveals: Upper Extremities: Full ROM and Muscle Strength 5/5 Lumbar Hypersensitivity Bilateral Greater Trochanter Tenderness Lower Extremities: Full ROM and Muscle Strength 5/5 Bilateral Lower Extremities Flexion Produces Pain into Lumbar and Lower Extremities Arises from Table slowly using straight cane for support Narrow Based Gait  Neurological: She is alert and oriented to person, place, and time.  Skin: Skin is warm and dry.  Psychiatric: She has a normal mood and affect.  Nursing note and vitals reviewed.          Assessment & Plan:  1. Cauda equina syndrome: Incontinence: Wearing Depends:Continue to Monitor. 01/21/2018 2. Insomnia: Continue with Nortriptyline. 01/22/2018 3. 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 Continue current medication regimen. 01/21/2018 Refilled: Percocet 10/325 mg #120--use 1 tablet every 6 hours as neededand MS Contin 30 mg one tablet every 12 hours as needed #60. Second script sent for the following month.  We will continue the opioid monitoring program, this consists of regular clinic visits, examinations, urine drug screen, pill counts as well as use of [place] Controlled Substance Reporting System.. 4. Depression: Continue with Lexapro. Continue to Monitor. 01/21/2018 5. Anxiety: Continue with current medication regimen with Xanax. Continue to Monitor. 01/21/2018 6.Neuropathy:Continue:with current medication nregimen with Gabapentin. Continue Pamelor. 01/21/2018 7. Constipation: Continue Senokot. 01/21/2018 8.Muscle Spasm: Continue Baclofen.01/21/2018   20 minutes of face to face patient care time was spent during this visit. All questions were encouraged and answered.   F/U in 1 month

## 2018-01-27 LAB — 6-ACETYLMORPHINE,TOXASSURE ADD
6-ACETYLMORPHINE: NEGATIVE
6-acetylmorphine: NOT DETECTED ng/mg creat

## 2018-01-27 LAB — TOXASSURE SELECT,+ANTIDEPR,UR

## 2018-01-28 ENCOUNTER — Other Ambulatory Visit: Payer: Self-pay | Admitting: Physical Medicine & Rehabilitation

## 2018-01-28 ENCOUNTER — Telehealth: Payer: Self-pay | Admitting: *Deleted

## 2018-01-28 NOTE — Telephone Encounter (Signed)
Urine drug screen for this encounter is consistent for prescribed medication 

## 2018-03-23 ENCOUNTER — Encounter: Payer: Self-pay | Admitting: Registered Nurse

## 2018-03-23 ENCOUNTER — Encounter: Payer: Medicare Other | Attending: Physical Medicine and Rehabilitation | Admitting: Registered Nurse

## 2018-03-23 VITALS — BP 121/84 | HR 89 | Resp 14 | Ht 67.0 in | Wt 232.0 lb

## 2018-03-23 DIAGNOSIS — M7062 Trochanteric bursitis, left hip: Secondary | ICD-10-CM | POA: Diagnosis not present

## 2018-03-23 DIAGNOSIS — F3289 Other specified depressive episodes: Secondary | ICD-10-CM | POA: Diagnosis not present

## 2018-03-23 DIAGNOSIS — Z79899 Other long term (current) drug therapy: Secondary | ICD-10-CM | POA: Diagnosis not present

## 2018-03-23 DIAGNOSIS — Z5181 Encounter for therapeutic drug level monitoring: Secondary | ICD-10-CM | POA: Insufficient documentation

## 2018-03-23 DIAGNOSIS — G039 Meningitis, unspecified: Secondary | ICD-10-CM

## 2018-03-23 DIAGNOSIS — G894 Chronic pain syndrome: Secondary | ICD-10-CM

## 2018-03-23 DIAGNOSIS — M5416 Radiculopathy, lumbar region: Secondary | ICD-10-CM | POA: Diagnosis not present

## 2018-03-23 DIAGNOSIS — M62838 Other muscle spasm: Secondary | ICD-10-CM | POA: Diagnosis not present

## 2018-03-23 DIAGNOSIS — M961 Postlaminectomy syndrome, not elsewhere classified: Secondary | ICD-10-CM | POA: Diagnosis not present

## 2018-03-23 DIAGNOSIS — M5136 Other intervertebral disc degeneration, lumbar region: Secondary | ICD-10-CM | POA: Diagnosis not present

## 2018-03-23 DIAGNOSIS — F411 Generalized anxiety disorder: Secondary | ICD-10-CM

## 2018-03-23 DIAGNOSIS — G834 Cauda equina syndrome: Secondary | ICD-10-CM | POA: Insufficient documentation

## 2018-03-23 DIAGNOSIS — M7061 Trochanteric bursitis, right hip: Secondary | ICD-10-CM | POA: Diagnosis not present

## 2018-03-23 MED ORDER — OXYCODONE-ACETAMINOPHEN 10-325 MG PO TABS: 1.0000 | ORAL_TABLET | Freq: Four times a day (QID) | ORAL | 0 refills | Status: DC | PRN

## 2018-03-23 MED ORDER — ALPRAZOLAM 0.25 MG PO TABS: 0.2500 mg | ORAL_TABLET | Freq: Two times a day (BID) | ORAL | 2 refills | Status: DC | PRN

## 2018-03-23 MED ORDER — MORPHINE SULFATE ER 30 MG PO TBCR: 30.0000 mg | EXTENDED_RELEASE_TABLET | Freq: Two times a day (BID) | ORAL | 0 refills | Status: DC

## 2018-03-23 NOTE — Progress Notes (Signed)
Subjective:    Patient ID: Sherry Wong, female    DOB: [birth year], 62 y.o.   MRN: [id number]  HPI: [name] is a 62 year old female who returns for follow up appointment for chronic pain and medication refill. She states her pain is located in her lower back radiating into her bilateral lower extremities and bilateral feet. She rates her pain 4. Her current exercise regime is walking.  Sherry Wong also reports she had three falls over the month, on Feb 13, 2018 she brought her husband home from the hospital and he lost his balance and fell on her. She was able to get up and didn't seek medical attention. Also had two falls at her sisters she lost her balanced. Also states her husband passed away on Mar 11, 2018, emotional support given. Sherry Wong staying with her sister at this time.   Sherry Wong Morphine Equivalent is 120.00 MME. She is also prescribed Alprazolam .We have discussed the black box warning of using opioids and benzodiazepines. I highlighted the dangers of using these drugs together and discussed the adverse events including respiratory suppression, overdose, cognitive impairment and importance of compliance with current regimen. We will continue to monitor and adjust as indicated.   Last UDS was Performed on 01/21/2018, it was consistent.   Pain Inventory Average Pain 6 Pain Right Now 4 My pain is sharp, burning, dull, stabbing, tingling and aching  In the last 24 hours, has pain interfered with the following? General activity 8 Relation with others 8 Enjoyment of life 8 What TIME of day is your pain at its worst? all Sleep (in general) Fair  Pain is worse with: walking, bending, sitting, standing and some activites Pain improves with: rest, heat/ice and medication Relief from Meds: 4  Mobility walk with assistance use a cane how many minutes can you walk? 15 ability to climb steps?  yes do you drive?  yes Do you have any goals in this area?   yes  Function disabled: date disabled . I need assistance with the following:  meal prep, household duties and shopping  Neuro/Psych bladder control problems bowel control problems weakness numbness tingling trouble walking spasms depression anxiety  Prior Studies Any changes since last visit?  no  Physicians involved in your care Any changes since last visit?  no   Family History  Problem Relation Age of Onset  . Anesthesia problems Mother   . Anesthesia problems Sister   . Hypotension Neg Hx   . Malignant hyperthermia Neg Hx   . Pseudochol deficiency Neg Hx    Social History   Socioeconomic History  . Marital status: Married    Spouse name: Not on file  . Number of children: 0  . Years of education: 66  . Highest education level: Not on file  Occupational History  . Occupation: Disabled  Social Needs  . Financial resource strain: Not on file  . Food insecurity:    Worry: Not on file    Inability: Not on file  . Transportation needs:    Medical: Not on file    Non-medical: Not on file  Tobacco Use  . Smoking status: Never Smoker  . Smokeless tobacco: Never Used  Substance and Sexual Activity  . Alcohol use: Yes    Alcohol/week: 0.0 oz    Comment: occ- 0.5 can beer every 2 months  . Drug use: No  . Sexual activity: Not Currently  Lifestyle  . Physical activity:  Days per week: Not on file    Minutes per session: Not on file  . Stress: Not on file  Relationships  . Social connections:    Talks on phone: Not on file    Gets together: Not on file    Attends religious service: Not on file    Active member of club or organization: Not on file    Attends meetings of clubs or organizations: Not on file    Relationship status: Not on file  Other Topics Concern  . Not on file  Social History Narrative   Lives at home with husband.   Caffeine use: 1 cup coffee 3 times per week.    Past Surgical History:  Procedure Laterality Date  . BREAST  BIOPSY     right  . BREAST SURGERY     left breast lumpectomy  . CHOLECYSTECTOMY    . COLONOSCOPY    . cysts removed from ovary    . DILATION AND CURETTAGE OF UTERUS    . ESOPHAGOGASTRODUODENOSCOPY    . HEMATOMA EVACUATION N/A 08/25/2013   Procedure: EVACUATION OF LUMBAR HEMATOMA;  Surgeon: Floyce Stakes, MD;  Location: [facility];  Service: Neurosurgery;  Laterality: N/A;  . LUMBAR LAMINECTOMY/DECOMPRESSION MICRODISCECTOMY  12/03/2011   Procedure: LUMBAR LAMINECTOMY/DECOMPRESSION MICRODISCECTOMY 2 LEVELS;  Surgeon: Floyce Stakes, MD;  Location: [place] [facility];  Service: Neurosurgery;  Laterality: Right;  Right Lumbar four-five Lumbar five sacral one Foraminotomies  . LUMBAR WOUND DEBRIDEMENT N/A 09/01/2013   Procedure: LUMBAR WOUND DEBRIDEMENT;  Surgeon: Floyce Stakes, MD;  Location: [facility];  Service: Neurosurgery;  Laterality: N/A;  . PLACEMENT OF LUMBAR DRAIN N/A 09/01/2013   Procedure: PLACEMENT OF LUMBAR DRAIN;  Surgeon: Floyce Stakes, MD;  Location: [facility];  Service: Neurosurgery;  Laterality: N/A;  . TRANSFORAMINAL LUMBAR INTERBODY FUSION (TLIF) WITH PEDICLE SCREW FIXATION 2 LEVEL N/A 08/25/2013   Procedure:  Lumbar four-five, Lumbar five-Sacral one Transforaminal Lumbar Interbody Fusion ;  Surgeon: Floyce Stakes, MD;  Location: [place] [facility];  Service: Neurosurgery;  Laterality: N/A;   Past Medical History:  Diagnosis Date  . Anxiety    takes celexa daily  . Arthritis   . Asthma   . Bronchitis   . Chronic back pain    ddd/lumbago,and radiculopathy  . Depression   . Gastric ulcer   . GERD (gastroesophageal reflux disease)    takes Protonix daily  . Gout    hx of  . H/O hiatal hernia   . Hx of colonic polyps   . Hypertension    takes Losartan daily  . Hypothyroidism    takes Levothyroxine daily  . IBS (irritable bowel syndrome)   . Nocturia   . Pneumonia    hx of 15+yrs ago  . PONV (postoperative nausea and vomiting)   . Ulcerative colitis    . Urinary incontinence    BP 121/84 (BP Location: Right Arm, Patient Position: Sitting, Cuff Size: Normal)   Pulse 89   Resp 14   Ht 5\' 7"  (1.702 m)   Wt 232 lb (105.2 kg)   SpO2 94%   BMI 36.34 kg/m   Opioid Risk Score:   Fall Risk Score:  `1  Depression screen PHQ 2/9  Depression screen [facility] 2/9 07/24/2017 01/22/2017 05/22/2016 03/20/2016 12/20/2015 05/23/2015 04/25/2015  Decreased Interest 2 1 0 0 1 1 1   Down, Depressed, Hopeless 2 1 0 0 1 1 1   PHQ - 2 Score 4 2 0  0 2 2 2   Altered sleeping - - - - - - -  Tired, decreased energy - - - - - - -  Change in appetite - - - - - - -  Feeling bad or failure about yourself  - - - - - - -  Trouble concentrating - - - - - - -  Moving slowly or fidgety/restless - - - - - - -  Suicidal thoughts - - - - - - -  PHQ-9 Score - - - - - - -    Review of Systems  Constitutional: Negative.   HENT: Negative.   Eyes: Negative.   Respiratory: Negative.   Cardiovascular: Negative.   Gastrointestinal: Positive for constipation.  Endocrine: Negative.   Genitourinary: Positive for difficulty urinating.  Musculoskeletal: Positive for arthralgias, back pain and gait problem.       Spasms   Skin: Negative.   Allergic/Immunologic: Negative.   Neurological: Positive for weakness and numbness.       Tingling  Psychiatric/Behavioral: Positive for dysphoric mood. The patient is nervous/anxious.   All other systems reviewed and are negative.      Objective:   Physical Exam  Constitutional: She is oriented to person, place, and time. She appears well-developed and well-nourished.  HENT:  Head: Normocephalic and atraumatic.  Neck: Normal range of motion. Neck supple.  Cardiovascular: Normal rate and regular rhythm.  Pulmonary/Chest: Effort normal and breath sounds normal.  Musculoskeletal:  Normal Muscle Bulk and Muscle Testing Reveals: Upper Extremities: Full ROM and Muscle Strength 5/5 Lumbar Paraspinal Tenderness: L-3-L-5 Lower Extremities:  Full ROM and Muscle Strength 5/5 Bilateral Lower Extremities Flexion Produces Pain into Bilateral Lower Extremities Arises from Table slowly using cane for support Antalgic gait  Neurological: She is alert and oriented to person, place, and time.  Skin: Skin is warm and dry.  Psychiatric: She has a normal mood and affect.  Nursing note and vitals reviewed.         Assessment & Plan:  1. Cauda equina syndrome: Incontinence: Wearing Depends:Continue to Monitor.03/23/2018 2. Insomnia: Continue with Nortriptyline.03/23/2018 3. 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 Continue current medication regimen. 03/23/2018 Refilled: Percocet 10/325 mg #120--use 1 tablet every 6 hours as neededand MS Contin 30 mg one tablet every 12 hours as needed #60. Second script sent for the following month.  We will continue the opioid monitoring program, this consists of regular clinic visits, examinations, urine drug screen, pill counts as well as use of [place] Controlled Substance Reporting System.. 4. Depression: Continue with Lexapro. Continue to Monitor.03/23/2018 5. Anxiety: Continue with current medication regimen with Xanax. Continue to Monitor.03/23/2018 6.Neuropathy:Continue:with current medication nregimen with  Pamelor. 03/23/2018 7. Constipation: Continue Senokot.03/23/2018 8.Muscle Spasm: Continue Baclofen.03/23/2018   20 minutes of face to face patient care time was spent during this visit. All questions were encouraged and answered.   F/U in 2 months

## 2018-04-25 ENCOUNTER — Other Ambulatory Visit: Payer: Self-pay | Admitting: Physical Medicine & Rehabilitation

## 2018-05-25 ENCOUNTER — Encounter: Payer: Self-pay | Admitting: Registered Nurse

## 2018-05-25 ENCOUNTER — Other Ambulatory Visit: Payer: Self-pay

## 2018-05-25 ENCOUNTER — Encounter: Payer: Medicare Other | Attending: Physical Medicine and Rehabilitation | Admitting: Registered Nurse

## 2018-05-25 VITALS — BP 123/80 | HR 90 | Ht 67.0 in | Wt 230.2 lb

## 2018-05-25 DIAGNOSIS — G894 Chronic pain syndrome: Secondary | ICD-10-CM

## 2018-05-25 DIAGNOSIS — F3289 Other specified depressive episodes: Secondary | ICD-10-CM

## 2018-05-25 DIAGNOSIS — M961 Postlaminectomy syndrome, not elsewhere classified: Secondary | ICD-10-CM

## 2018-05-25 DIAGNOSIS — G834 Cauda equina syndrome: Secondary | ICD-10-CM

## 2018-05-25 DIAGNOSIS — Z5181 Encounter for therapeutic drug level monitoring: Secondary | ICD-10-CM

## 2018-05-25 DIAGNOSIS — M5416 Radiculopathy, lumbar region: Secondary | ICD-10-CM | POA: Diagnosis not present

## 2018-05-25 DIAGNOSIS — M5136 Other intervertebral disc degeneration, lumbar region: Secondary | ICD-10-CM | POA: Diagnosis not present

## 2018-05-25 DIAGNOSIS — E876 Hypokalemia: Secondary | ICD-10-CM | POA: Diagnosis not present

## 2018-05-25 DIAGNOSIS — Z79899 Other long term (current) drug therapy: Secondary | ICD-10-CM | POA: Diagnosis not present

## 2018-05-25 DIAGNOSIS — K219 Gastro-esophageal reflux disease without esophagitis: Secondary | ICD-10-CM | POA: Diagnosis not present

## 2018-05-25 DIAGNOSIS — M7062 Trochanteric bursitis, left hip: Secondary | ICD-10-CM

## 2018-05-25 DIAGNOSIS — M7061 Trochanteric bursitis, right hip: Secondary | ICD-10-CM

## 2018-05-25 DIAGNOSIS — G4700 Insomnia, unspecified: Secondary | ICD-10-CM

## 2018-05-25 DIAGNOSIS — R7989 Other specified abnormal findings of blood chemistry: Secondary | ICD-10-CM | POA: Diagnosis not present

## 2018-05-25 DIAGNOSIS — G039 Meningitis, unspecified: Secondary | ICD-10-CM

## 2018-05-25 DIAGNOSIS — M62838 Other muscle spasm: Secondary | ICD-10-CM

## 2018-05-25 DIAGNOSIS — E039 Hypothyroidism, unspecified: Secondary | ICD-10-CM | POA: Diagnosis not present

## 2018-05-25 DIAGNOSIS — F411 Generalized anxiety disorder: Secondary | ICD-10-CM

## 2018-05-25 DIAGNOSIS — M51369 Other intervertebral disc degeneration, lumbar region without mention of lumbar back pain or lower extremity pain: Secondary | ICD-10-CM

## 2018-05-25 MED ORDER — ALPRAZOLAM 0.25 MG PO TABS: 0.2500 mg | ORAL_TABLET | Freq: Two times a day (BID) | ORAL | 2 refills | Status: DC | PRN

## 2018-05-25 MED ORDER — OXYCODONE-ACETAMINOPHEN 10-325 MG PO TABS: 1.0000 | ORAL_TABLET | Freq: Four times a day (QID) | ORAL | 0 refills | Status: DC | PRN

## 2018-05-25 MED ORDER — MORPHINE SULFATE ER 30 MG PO TBCR: 30.0000 mg | EXTENDED_RELEASE_TABLET | Freq: Two times a day (BID) | ORAL | 0 refills | Status: DC

## 2018-05-25 MED ORDER — BACLOFEN 20 MG PO TABS: 20.0000 mg | ORAL_TABLET | Freq: Three times a day (TID) | ORAL | 2 refills | Status: DC

## 2018-05-25 NOTE — Progress Notes (Signed)
Subjective:    Patient ID: Sherry Wong, female    DOB: 1956-02-21, 61 y.o.   MRN: [id number]  HPI: Sherry Wong is a 62 year old female who returns for follow up appointment for chronic pain and medication refill. She states her pain is located in her mid-lower back radiating into her bilateral lower extremities and bilateral feet. Also reports bilateral hip pain. She rates her pain 5. Her current exercise regime is walking and performing stretching exercises.   Sherry Wong Morphine Equivalent is 120.00 MME. She is also prescribed Alprazolam.We have discussed the black box warning of using opioids and benzodiazepines. I highlighted the dangers of using these drugs together and discussed the adverse events including respiratory suppression, overdose, cognitive impairment and importance of compliance with current regimen. We will continue to monitor and adjust as indicated.    Last UDS was Performed on 01/21/2018, it was consistent.    Pain Inventory Average Pain 6 Pain Right Now 5 My pain is constant, sharp, burning, dull, tingling and aching  In the last 24 hours, has pain interfered with the following? General activity 8 Relation with others 8 Enjoyment of life 8 What TIME of day is your pain at its worst? all Sleep (in general) Fair  Pain is worse with: walking, bending, sitting, standing and some activites Pain improves with: rest, heat/ice, pacing activities and medication Relief from Meds: 4  Mobility walk with assistance use a cane how many minutes can you walk? 15-20 ability to climb steps?  yes do you drive?  yes  Function disabled: date disabled 04/30/13 I need assistance with the following:  household duties and shopping  Neuro/Psych bladder control problems bowel control problems numbness tremor tingling trouble walking spasms confusion depression anxiety  Prior Studies Any changes since last visit?  no  Physicians involved in your care Any  changes since last visit?  no   Family History  Problem Relation Age of Onset  . Anesthesia problems Mother   . Anesthesia problems Sister   . Hypotension Neg Hx   . Malignant hyperthermia Neg Hx   . Pseudochol deficiency Neg Hx    Social History   Socioeconomic History  . Marital status: Married    Spouse name: Not on file  . Number of children: 0  . Years of education: 40  . Highest education level: Not on file  Occupational History  . Occupation: Disabled  Social Needs  . Financial resource strain: Not on file  . Food insecurity:    Worry: Not on file    Inability: Not on file  . Transportation needs:    Medical: Not on file    Non-medical: Not on file  Tobacco Use  . Smoking status: Never Smoker  . Smokeless tobacco: Never Used  Substance and Sexual Activity  . Alcohol use: Yes    Alcohol/week: 0.0 standard drinks    Comment: occ- 0.5 can beer every 2 months  . Drug use: No  . Sexual activity: Not Currently  Lifestyle  . Physical activity:    Days per week: Not on file    Minutes per session: Not on file  . Stress: Not on file  Relationships  . Social connections:    Talks on phone: Not on file    Gets together: Not on file    Attends religious service: Not on file    Active member of club or organization: Not on file    Attends meetings of clubs or  organizations: Not on file    Relationship status: Not on file  Other Topics Concern  . Not on file  Social History Narrative   Lives at home with husband.   Caffeine use: 1 cup coffee 3 times per week.    Past Surgical History:  Procedure Laterality Date  . BREAST BIOPSY     right  . BREAST SURGERY     left breast lumpectomy  . CHOLECYSTECTOMY    . COLONOSCOPY    . cysts removed from ovary    . DILATION AND CURETTAGE OF UTERUS    . ESOPHAGOGASTRODUODENOSCOPY    . HEMATOMA EVACUATION N/A 08/25/2013   Procedure: EVACUATION OF LUMBAR HEMATOMA;  Surgeon: Floyce Stakes, MD;  Location: [facility];   Service: Neurosurgery;  Laterality: N/A;  . LUMBAR LAMINECTOMY/DECOMPRESSION MICRODISCECTOMY  12/03/2011   Procedure: LUMBAR LAMINECTOMY/DECOMPRESSION MICRODISCECTOMY 2 LEVELS;  Surgeon: Floyce Stakes, MD;  Location: [place] [facility];  Service: Neurosurgery;  Laterality: Right;  Right Lumbar four-five Lumbar five sacral one Foraminotomies  . LUMBAR WOUND DEBRIDEMENT N/A 09/01/2013   Procedure: LUMBAR WOUND DEBRIDEMENT;  Surgeon: Floyce Stakes, MD;  Location: [facility];  Service: Neurosurgery;  Laterality: N/A;  . PLACEMENT OF LUMBAR DRAIN N/A 09/01/2013   Procedure: PLACEMENT OF LUMBAR DRAIN;  Surgeon: Floyce Stakes, MD;  Location: [facility];  Service: Neurosurgery;  Laterality: N/A;  . TRANSFORAMINAL LUMBAR INTERBODY FUSION (TLIF) WITH PEDICLE SCREW FIXATION 2 LEVEL N/A 08/25/2013   Procedure:  Lumbar four-five, Lumbar five-Sacral one Transforaminal Lumbar Interbody Fusion ;  Surgeon: Floyce Stakes, MD;  Location: [place] [facility];  Service: Neurosurgery;  Laterality: N/A;   Past Medical History:  Diagnosis Date  . Anxiety    takes celexa daily  . Arthritis   . Asthma   . Bronchitis   . Chronic back pain    ddd/lumbago,and radiculopathy  . Depression   . Gastric ulcer   . GERD (gastroesophageal reflux disease)    takes Protonix daily  . Gout    hx of  . H/O hiatal hernia   . Hx of colonic polyps   . Hypertension    takes Losartan daily  . Hypothyroidism    takes Levothyroxine daily  . IBS (irritable bowel syndrome)   . Nocturia   . Pneumonia    hx of 15+yrs ago  . PONV (postoperative nausea and vomiting)   . Ulcerative colitis   . Urinary incontinence    BP 123/80   Pulse 90   Ht 5\' 7"  (1.702 m)   Wt 230 lb 3.2 oz (104.4 kg)   SpO2 93%   BMI 36.05 kg/m   Opioid Risk Score:   Fall Risk Score:  `1  Depression screen PHQ 2/9  Depression screen [facility] 2/9 07/24/2017 01/22/2017 05/22/2016 03/20/2016 12/20/2015 05/23/2015 04/25/2015  Decreased Interest 2 1 0 0 1 1 1     Down, Depressed, Hopeless 2 1 0 0 1 1 1   PHQ - 2 Score 4 2 0 0 2 2 2   Altered sleeping - - - - - - -  Tired, decreased energy - - - - - - -  Change in appetite - - - - - - -  Feeling bad or failure about yourself  - - - - - - -  Trouble concentrating - - - - - - -  Moving slowly or fidgety/restless - - - - - - -  Suicidal thoughts - - - - - - -  PHQ-9 Score - - - - - - -     Review of Systems  Constitutional: Positive for diaphoresis and unexpected weight change.  HENT: Negative.   Eyes: Negative.   Respiratory: Positive for apnea, cough, shortness of breath and wheezing.   Cardiovascular: Positive for leg swelling.  Gastrointestinal: Positive for abdominal pain, constipation, diarrhea and nausea.  Endocrine: Negative.   Genitourinary: Positive for difficulty urinating.  Musculoskeletal: Positive for back pain and gait problem.       Spasms  Skin: Negative.   Allergic/Immunologic: Negative.   Neurological: Positive for tremors, weakness and numbness.       Tingling  Hematological: Negative.   Psychiatric/Behavioral: Positive for confusion and dysphoric mood. The patient is nervous/anxious.   All other systems reviewed and are negative.      Objective:   Physical Exam  Constitutional: She is oriented to person, place, and time. She appears well-developed and well-nourished.  HENT:  Head: Normocephalic and atraumatic.  Neck: Normal range of motion. Neck supple.  Cardiovascular: Regular rhythm.  Pulmonary/Chest: Effort normal and breath sounds normal.  Musculoskeletal:  Normal Muscle Bulk and Muscle Testing Reveals: Upper Extremities: Full ROM and Muscle Strength 5/5 Thoracic Paraspinal Tenderness: T-7-T-9 Lumbar Paraspinal Tenderness: L-3-L-5 Lower Extremities: Full ROM and Muscle Strength 5/5 Arises from Table Slowly using cane for support Narrow Based gait   Neurological: She is alert and oriented to person, place, and time.  Skin: Skin is warm and dry.   Psychiatric: She has a normal mood and affect. Her behavior is normal.  Nursing note and vitals reviewed.         Assessment & Plan:  1. Cauda equina syndrome: Incontinence: Wearing Depends:Continue to Monitor.05/25/2018 2. Insomnia: Continue with Nortriptyline.05/25/2018 3. DDD L4-S1 s/p diskectomy with decompression of thecal sac: She continues to have back pain.Continue current medication regimen. 05/25/2018 Refilled: Percocet 10/325 mg #120--use 1 tablet every 6 hours as neededand MS Contin 30 mg one tablet every 12 hours as needed #60. Second script sent for the following month. We will continue the opioid monitoring program, this consists of regular clinic visits, examinations, urine drug screen, pill counts as well as use of [place] Controlled Substance Reporting System.. 4. Depression: Continue with Lexapro. Continue to Monitor.05/25/2018 5. Anxiety: Continue withcurrent medication regimen withXanax. Continue to Monitor.05/25/2018 6.Neuropathy:Continue:with current medication nregimen with  Pamelor. 05/25/2018 7. Constipation: Continue Senokot.05/25/2018 8.Muscle Spasm: Continue Baclofen.05/25/2018   20 minutes of face to face patient care time was spent during this visit. All questions were encouraged and answered.   F/U in 2 months

## 2018-05-27 DIAGNOSIS — J019 Acute sinusitis, unspecified: Secondary | ICD-10-CM | POA: Diagnosis not present

## 2018-05-27 DIAGNOSIS — J209 Acute bronchitis, unspecified: Secondary | ICD-10-CM | POA: Diagnosis not present

## 2018-05-27 DIAGNOSIS — Z0001 Encounter for general adult medical examination with abnormal findings: Secondary | ICD-10-CM | POA: Diagnosis not present

## 2018-05-27 DIAGNOSIS — I10 Essential (primary) hypertension: Secondary | ICD-10-CM | POA: Diagnosis not present

## 2018-06-18 DIAGNOSIS — R197 Diarrhea, unspecified: Secondary | ICD-10-CM | POA: Diagnosis not present

## 2018-06-18 DIAGNOSIS — Z23 Encounter for immunization: Secondary | ICD-10-CM | POA: Diagnosis not present

## 2018-06-22 DIAGNOSIS — R197 Diarrhea, unspecified: Secondary | ICD-10-CM | POA: Diagnosis not present

## 2018-06-24 ENCOUNTER — Other Ambulatory Visit: Payer: Self-pay | Admitting: Registered Nurse

## 2018-07-20 ENCOUNTER — Encounter: Payer: Medicare Other | Attending: Physical Medicine and Rehabilitation | Admitting: Physical Medicine & Rehabilitation

## 2018-07-20 ENCOUNTER — Encounter: Payer: Self-pay | Admitting: Physical Medicine & Rehabilitation

## 2018-07-20 ENCOUNTER — Other Ambulatory Visit: Payer: Self-pay

## 2018-07-20 VITALS — BP 113/75 | HR 81 | Ht 67.0 in | Wt 226.0 lb

## 2018-07-20 DIAGNOSIS — Z79899 Other long term (current) drug therapy: Secondary | ICD-10-CM | POA: Insufficient documentation

## 2018-07-20 DIAGNOSIS — Z5181 Encounter for therapeutic drug level monitoring: Secondary | ICD-10-CM | POA: Insufficient documentation

## 2018-07-20 DIAGNOSIS — Z79891 Long term (current) use of opiate analgesic: Secondary | ICD-10-CM

## 2018-07-20 DIAGNOSIS — G834 Cauda equina syndrome: Secondary | ICD-10-CM | POA: Insufficient documentation

## 2018-07-20 DIAGNOSIS — G039 Meningitis, unspecified: Secondary | ICD-10-CM | POA: Diagnosis not present

## 2018-07-20 DIAGNOSIS — M5136 Other intervertebral disc degeneration, lumbar region: Secondary | ICD-10-CM

## 2018-07-20 DIAGNOSIS — G894 Chronic pain syndrome: Secondary | ICD-10-CM

## 2018-07-20 DIAGNOSIS — F3289 Other specified depressive episodes: Secondary | ICD-10-CM

## 2018-07-20 MED ORDER — MORPHINE SULFATE ER 30 MG PO TBCR: 30.0000 mg | EXTENDED_RELEASE_TABLET | Freq: Two times a day (BID) | ORAL | 0 refills | Status: DC

## 2018-07-20 MED ORDER — OXYCODONE-ACETAMINOPHEN 10-325 MG PO TABS: 1.0000 | ORAL_TABLET | Freq: Four times a day (QID) | ORAL | 0 refills | Status: DC | PRN

## 2018-07-20 NOTE — Patient Instructions (Signed)
PLEASE FEEL FREE TO CALL OUR OFFICE WITH ANY PROBLEMS OR QUESTIONS ([phone])      

## 2018-07-20 NOTE — Progress Notes (Signed)
Subjective:    Patient ID: Sherry Wong, female    DOB: 08-Apr-1956, 62 y.o.   MRN: [id number]  HPI  Sherry Wong is here in follow up of her cauda equina and associated functional deficits. Her husband passed away in 02/25/2023 after an unexpected illness/medical complications. She has struggled with reactive depression since his passing. Her primary has put her on cymbalta to help. She hasn't made a lot of progress however and is still grieving. Unfortunately, she is often alone which tends to excacerbate the situation. She does have a nephew who she's close to.    Pain Inventory Average Pain 7 Pain Right Now 5 My pain is sharp, burning, dull, stabbing, tingling and aching  In the last 24 hours, has pain interfered with the following? General activity 8 Relation with others 8 Enjoyment of life 8 What TIME of day is your pain at its worst? all Sleep (in general) Fair  Pain is worse with: walking, bending, sitting, standing and some activites Pain improves with: heat/ice, pacing activities and medication Relief from Meds: 5  Mobility walk with assistance use a cane how many minutes can you walk? 20 ability to climb steps?  yes do you drive?  yes  Function not employed: date last employed 2013 disabled: date disabled 2014 I need assistance with the following:  bathing, household duties and shopping  Neuro/Psych bladder control problems bowel control problems weakness numbness tingling trouble walking spasms depression anxiety  Prior Studies Any changes since last visit?  no bone scan x-rays CT/MRI  Physicians involved in your care Primary care [facility] Neurologist Dr. Jaynee Eagles Neurosurgeon Dr. Antionette Fairy   Family History  Problem Relation Age of Onset  . Anesthesia problems Mother   . Anesthesia problems Sister   . Hypotension Neg Hx   . Malignant hyperthermia Neg Hx   . Pseudochol deficiency Neg Hx    Social History   Socioeconomic History  .  Marital status: Married    Spouse name: Not on file  . Number of children: 0  . Years of education: 56  . Highest education level: Not on file  Occupational History  . Occupation: Disabled  Social Needs  . Financial resource strain: Not on file  . Food insecurity:    Worry: Not on file    Inability: Not on file  . Transportation needs:    Medical: Not on file    Non-medical: Not on file  Tobacco Use  . Smoking status: Never Smoker  . Smokeless tobacco: Never Used  Substance and Sexual Activity  . Alcohol use: Yes    Alcohol/week: 0.0 standard drinks    Comment: occ- 0.5 can beer every 2 months  . Drug use: No  . Sexual activity: Not Currently  Lifestyle  . Physical activity:    Days per week: Not on file    Minutes per session: Not on file  . Stress: Not on file  Relationships  . Social connections:    Talks on phone: Not on file    Gets together: Not on file    Attends religious service: Not on file    Active member of club or organization: Not on file    Attends meetings of clubs or organizations: Not on file    Relationship status: Not on file  Other Topics Concern  . Not on file  Social History Narrative   Lives at home with husband.   Caffeine use: 1 cup coffee 3 times per week.  Past Surgical History:  Procedure Laterality Date  . BREAST BIOPSY     right  . BREAST SURGERY     left breast lumpectomy  . CHOLECYSTECTOMY    . COLONOSCOPY    . cysts removed from ovary    . DILATION AND CURETTAGE OF UTERUS    . ESOPHAGOGASTRODUODENOSCOPY    . HEMATOMA EVACUATION N/A 08/25/2013   Procedure: EVACUATION OF LUMBAR HEMATOMA;  Surgeon: Floyce Stakes, MD;  Location: [facility];  Service: Neurosurgery;  Laterality: N/A;  . LUMBAR LAMINECTOMY/DECOMPRESSION MICRODISCECTOMY  12/03/2011   Procedure: LUMBAR LAMINECTOMY/DECOMPRESSION MICRODISCECTOMY 2 LEVELS;  Surgeon: Floyce Stakes, MD;  Location: [place] [facility];  Service: Neurosurgery;  Laterality: Right;  Right  Lumbar four-five Lumbar five sacral one Foraminotomies  . LUMBAR WOUND DEBRIDEMENT N/A 09/01/2013   Procedure: LUMBAR WOUND DEBRIDEMENT;  Surgeon: Floyce Stakes, MD;  Location: [facility];  Service: Neurosurgery;  Laterality: N/A;  . PLACEMENT OF LUMBAR DRAIN N/A 09/01/2013   Procedure: PLACEMENT OF LUMBAR DRAIN;  Surgeon: Floyce Stakes, MD;  Location: [facility];  Service: Neurosurgery;  Laterality: N/A;  . TRANSFORAMINAL LUMBAR INTERBODY FUSION (TLIF) WITH PEDICLE SCREW FIXATION 2 LEVEL N/A 08/25/2013   Procedure:  Lumbar four-five, Lumbar five-Sacral one Transforaminal Lumbar Interbody Fusion ;  Surgeon: Floyce Stakes, MD;  Location: [place] [facility];  Service: Neurosurgery;  Laterality: N/A;   Past Medical History:  Diagnosis Date  . Anxiety    takes celexa daily  . Arthritis   . Asthma   . Bronchitis   . Chronic back pain    ddd/lumbago,and radiculopathy  . Depression   . Gastric ulcer   . GERD (gastroesophageal reflux disease)    takes Protonix daily  . Gout    hx of  . H/O hiatal hernia   . Hx of colonic polyps   . Hypertension    takes Losartan daily  . Hypothyroidism    takes Levothyroxine daily  . IBS (irritable bowel syndrome)   . Nocturia   . Pneumonia    hx of 15+yrs ago  . PONV (postoperative nausea and vomiting)   . Ulcerative colitis   . Urinary incontinence    BP 113/75   Pulse 81   Ht 5\' 7"  (1.702 m)   Wt 226 lb (102.5 kg)   SpO2 96%   BMI 35.40 kg/m   Opioid Risk Score:   Fall Risk Score:  `1  Depression screen PHQ 2/9  Depression screen [facility] 2/9 07/20/2018 07/24/2017 01/22/2017 05/22/2016 03/20/2016 12/20/2015 05/23/2015  Decreased Interest 1 2 1  0 0 1 1  Down, Depressed, Hopeless 1 2 1  0 0 1 1  PHQ - 2 Score 2 4 2  0 0 2 2  Altered sleeping - - - - - - -  Tired, decreased energy - - - - - - -  Change in appetite - - - - - - -  Feeling bad or failure about yourself  - - - - - - -  Trouble concentrating - - - - - - -  Moving slowly or  fidgety/restless - - - - - - -  Suicidal thoughts - - - - - - -  PHQ-9 Score - - - - - - -    Review of Systems  Constitutional: Negative.   HENT: Negative.   Eyes: Negative.   Cardiovascular: Negative.   Gastrointestinal: Positive for abdominal pain, constipation, diarrhea and nausea.  Endocrine: Negative.   Genitourinary: Positive for  dysuria.  Musculoskeletal: Negative.   Allergic/Immunologic: Negative.   Neurological: Negative.   Hematological: Negative.   Psychiatric/Behavioral: Negative.   All other systems reviewed and are negative.      Objective:   Physical Exam General: No acute distress HEENT: EOMI, oral membranes moist Cards: reg rate  Chest: normal effort Abdomen: Soft, NT, ND Skin: dry, intact Extremities: no edema  Musculoskeletal: LB TTP Lower Extremities: wide based gait..Sensation remainsdiminished below the waist bilaterally to light touch and pain--stable.DTR's remain1+.normal tone. Neurological: She is alert and oriented to person, place, and time.  Skin: Skin is warm and dry.  Psychiatric: flat and tearful    Assessment & Plan:  1. Cauda equina syndrome with arachnoiditis: pt with substantial pain and ongoing sensory loss. -have revieweda spinal stimulator which is an option if she should so choose -?caudal block could still an option.  -HEP ongoing 2. Insomnia: Continue with Trazodone and Xanax  3. DDD L4-S1 s/p diskectomy with decompression of thecal sac:  -continuePercocet 10/325 mg #100--1 q6 prn.  -continue ms contin to 30mg  q12 #60 -Medication was refilled and a second prescription was sent to the patient's pharmacy for next month.  . We will continue the opioid monitoring program, this consists of regular clinic visits, examinations, routine drug screening, pill counts as well as use of [place] Controlled Substance Reporting System. [place] today.   -UDS today    4. Depression/anxiety: cymbalta, xanax  -offered her counseling if she wanted to pursue  -grief counseling thru church  -socialization with family friends, activities 5. Gout? ---information provided. Discussed treatment options and importance of diet as well as potential precipitating factors. 6. Neuropathic pain: now on cymbalta 22m daily -continuenortriptyline- 50mg  qhs-  7.67minutes of face to face patient care time was spent during this visit. All questions were encouraged and answered. Follow up in 2 months with NP.

## 2018-07-24 LAB — 6-ACETYLMORPHINE,TOXASSURE ADD
6-ACETYLMORPHINE: NEGATIVE
6-acetylmorphine: NOT DETECTED ng/mg creat

## 2018-07-24 LAB — TOXASSURE SELECT,+ANTIDEPR,UR

## 2018-07-27 ENCOUNTER — Other Ambulatory Visit: Payer: Self-pay | Admitting: Physical Medicine & Rehabilitation

## 2018-07-27 ENCOUNTER — Telehealth: Payer: Self-pay | Admitting: *Deleted

## 2018-07-27 NOTE — Telephone Encounter (Signed)
Urine drug screen for this encounter is consistent for prescribed medication 

## 2018-08-04 DIAGNOSIS — R1084 Generalized abdominal pain: Secondary | ICD-10-CM | POA: Diagnosis not present

## 2018-08-04 DIAGNOSIS — A4901 Methicillin susceptible Staphylococcus aureus infection, unspecified site: Secondary | ICD-10-CM | POA: Diagnosis not present

## 2018-08-13 ENCOUNTER — Other Ambulatory Visit: Payer: Self-pay

## 2018-08-13 NOTE — Patient Outreach (Signed)
[facility]) Care Management  08/13/2018  [name] 1956-04-05 [id number]   Medication Adherence call to Sherry Wong spoke with patient she still has medication on Simvastatin 20 mg until the end of the month patient's prefers  to fill a 30 days at a time. Sherry Wong is showing past due under [place] health Care Ins.    [place] Management Direct Dial [phone]  Fax [phone] [email or web address]

## 2018-08-28 ENCOUNTER — Other Ambulatory Visit: Payer: Self-pay | Admitting: Registered Nurse

## 2018-09-17 ENCOUNTER — Encounter: Payer: Medicare Other | Attending: Physical Medicine and Rehabilitation | Admitting: Registered Nurse

## 2018-09-17 ENCOUNTER — Encounter: Payer: Self-pay | Admitting: Registered Nurse

## 2018-09-17 VITALS — BP 120/72 | HR 80 | Resp 14 | Ht 67.0 in | Wt 234.0 lb

## 2018-09-17 DIAGNOSIS — F3289 Other specified depressive episodes: Secondary | ICD-10-CM

## 2018-09-17 DIAGNOSIS — M5136 Other intervertebral disc degeneration, lumbar region: Secondary | ICD-10-CM | POA: Diagnosis not present

## 2018-09-17 DIAGNOSIS — Z5181 Encounter for therapeutic drug level monitoring: Secondary | ICD-10-CM | POA: Insufficient documentation

## 2018-09-17 DIAGNOSIS — M5416 Radiculopathy, lumbar region: Secondary | ICD-10-CM | POA: Diagnosis not present

## 2018-09-17 DIAGNOSIS — G834 Cauda equina syndrome: Secondary | ICD-10-CM | POA: Diagnosis not present

## 2018-09-17 DIAGNOSIS — F411 Generalized anxiety disorder: Secondary | ICD-10-CM

## 2018-09-17 DIAGNOSIS — Z79899 Other long term (current) drug therapy: Secondary | ICD-10-CM | POA: Insufficient documentation

## 2018-09-17 DIAGNOSIS — M7061 Trochanteric bursitis, right hip: Secondary | ICD-10-CM

## 2018-09-17 DIAGNOSIS — M961 Postlaminectomy syndrome, not elsewhere classified: Secondary | ICD-10-CM

## 2018-09-17 DIAGNOSIS — F5101 Primary insomnia: Secondary | ICD-10-CM

## 2018-09-17 DIAGNOSIS — M7062 Trochanteric bursitis, left hip: Secondary | ICD-10-CM

## 2018-09-17 DIAGNOSIS — G039 Meningitis, unspecified: Secondary | ICD-10-CM | POA: Diagnosis not present

## 2018-09-17 DIAGNOSIS — G894 Chronic pain syndrome: Secondary | ICD-10-CM

## 2018-09-17 DIAGNOSIS — M62838 Other muscle spasm: Secondary | ICD-10-CM

## 2018-09-17 MED ORDER — MORPHINE SULFATE ER 30 MG PO TBCR: 30.0000 mg | EXTENDED_RELEASE_TABLET | Freq: Two times a day (BID) | ORAL | 0 refills | Status: DC

## 2018-09-17 MED ORDER — OXYCODONE-ACETAMINOPHEN 10-325 MG PO TABS: 1.0000 | ORAL_TABLET | Freq: Four times a day (QID) | ORAL | 0 refills | Status: DC | PRN

## 2018-09-17 MED ORDER — NORTRIPTYLINE HCL 50 MG PO CAPS: 50.0000 mg | ORAL_CAPSULE | Freq: Every day | ORAL | 3 refills | Status: DC

## 2018-09-17 NOTE — Progress Notes (Signed)
Subjective:    Patient ID: Sherry Wong, female    DOB: 1956-07-05, 62 y.o.   MRN: [id number]  HPI: Sherry Wong is a 62 y.o. female who returns for follow up appointment for chronic pain and medication refill. She states her pain is located in her lower back radiating into her bilateral lower extremities and bilateral feet. Also reports bilateral hip pain. She rates her pain 5. Her current exercise regime is walking, attending [employer] 3 days a week, riding on stationary bicycle for 30 minutes and performing stretching exercises.  Sherry Wong Morphine equivalent is 120.00 MME. She  is also prescribed Alprazolam.We have discussed the black box warning of using opioids and benzodiazepines. I highlighted the dangers of using these drugs together and discussed the adverse events including respiratory suppression, overdose, cognitive impairment and importance of compliance with current regimen. We will continue to monitor and adjust as indicated.   Last UDS was Performed on 07/20/2018, it was consistent.   Pain Inventory Average Pain 7 Pain Right Now 5 My pain is sharp, burning, stabbing, tingling and aching  In the last 24 hours, has pain interfered with the following? General activity 9 Relation with others 9 Enjoyment of life 9 What TIME of day is your pain at its worst? all Sleep (in general) Fair  Pain is worse with: walking, bending, sitting, standing and some activites Pain improves with: rest, heat/ice and medication Relief from Meds: 5  Mobility walk with assistance use a cane how many minutes can you walk? 10-15 ability to climb steps?  yes do you drive?  yes Do you have any goals in this area?  yes  Function disabled: date disabled . I need assistance with the following:  household duties and shopping  Neuro/Psych bladder control problems bowel control problems weakness numbness tingling trouble walking depression  Prior Studies Any changes since  last visit?  no  Physicians involved in your care Any changes since last visit?  no   Family History  Problem Relation Age of Onset  . Anesthesia problems Mother   . Anesthesia problems Sister   . Hypotension Neg Hx   . Malignant hyperthermia Neg Hx   . Pseudochol deficiency Neg Hx    Social History   Socioeconomic History  . Marital status: Married    Spouse name: Not on file  . Number of children: 0  . Years of education: 45  . Highest education level: Not on file  Occupational History  . Occupation: Disabled  Social Needs  . Financial resource strain: Not on file  . Food insecurity:    Worry: Not on file    Inability: Not on file  . Transportation needs:    Medical: Not on file    Non-medical: Not on file  Tobacco Use  . Smoking status: Never Smoker  . Smokeless tobacco: Never Used  Substance and Sexual Activity  . Alcohol use: Yes    Alcohol/week: 0.0 standard drinks    Comment: occ- 0.5 can beer every 2 months  . Drug use: No  . Sexual activity: Not Currently  Lifestyle  . Physical activity:    Days per week: Not on file    Minutes per session: Not on file  . Stress: Not on file  Relationships  . Social connections:    Talks on phone: Not on file    Gets together: Not on file    Attends religious service: Not on file    Active member of  club or organization: Not on file    Attends meetings of clubs or organizations: Not on file    Relationship status: Not on file  Other Topics Concern  . Not on file  Social History Narrative   Lives at home with husband.   Caffeine use: 1 cup coffee 3 times per week.    Past Surgical History:  Procedure Laterality Date  . BREAST BIOPSY     right  . BREAST SURGERY     left breast lumpectomy  . CHOLECYSTECTOMY    . COLONOSCOPY    . cysts removed from ovary    . DILATION AND CURETTAGE OF UTERUS    . ESOPHAGOGASTRODUODENOSCOPY    . HEMATOMA EVACUATION N/A 08/25/2013   Procedure: EVACUATION OF LUMBAR HEMATOMA;   Surgeon: Floyce Stakes, MD;  Location: [facility];  Service: Neurosurgery;  Laterality: N/A;  . LUMBAR LAMINECTOMY/DECOMPRESSION MICRODISCECTOMY  12/03/2011   Procedure: LUMBAR LAMINECTOMY/DECOMPRESSION MICRODISCECTOMY 2 LEVELS;  Surgeon: Floyce Stakes, MD;  Location: [place] [facility];  Service: Neurosurgery;  Laterality: Right;  Right Lumbar four-five Lumbar five sacral one Foraminotomies  . LUMBAR WOUND DEBRIDEMENT N/A 09/01/2013   Procedure: LUMBAR WOUND DEBRIDEMENT;  Surgeon: Floyce Stakes, MD;  Location: [facility];  Service: Neurosurgery;  Laterality: N/A;  . PLACEMENT OF LUMBAR DRAIN N/A 09/01/2013   Procedure: PLACEMENT OF LUMBAR DRAIN;  Surgeon: Floyce Stakes, MD;  Location: [facility];  Service: Neurosurgery;  Laterality: N/A;  . TRANSFORAMINAL LUMBAR INTERBODY FUSION (TLIF) WITH PEDICLE SCREW FIXATION 2 LEVEL N/A 08/25/2013   Procedure:  Lumbar four-five, Lumbar five-Sacral one Transforaminal Lumbar Interbody Fusion ;  Surgeon: Floyce Stakes, MD;  Location: [place] [facility];  Service: Neurosurgery;  Laterality: N/A;   Past Medical History:  Diagnosis Date  . Anxiety    takes celexa daily  . Arthritis   . Asthma   . Bronchitis   . Chronic back pain    ddd/lumbago,and radiculopathy  . Depression   . Gastric ulcer   . GERD (gastroesophageal reflux disease)    takes Protonix daily  . Gout    hx of  . H/O hiatal hernia   . Hx of colonic polyps   . Hypertension    takes Losartan daily  . Hypothyroidism    takes Levothyroxine daily  . IBS (irritable bowel syndrome)   . Nocturia   . Pneumonia    hx of 15+yrs ago  . PONV (postoperative nausea and vomiting)   . Ulcerative colitis   . Urinary incontinence    BP 120/72   Pulse 80   Resp 14   Ht 5\' 7"  (1.702 m)   Wt 234 lb (106.1 kg)   SpO2 95%   BMI 36.65 kg/m   Opioid Risk Score:   Fall Risk Score:  `1  Depression screen PHQ 2/9  Depression screen [facility] 2/9 07/20/2018 07/24/2017 01/22/2017 05/22/2016  03/20/2016 12/20/2015 05/23/2015  Decreased Interest 1 2 1  0 0 1 1  Down, Depressed, Hopeless 1 2 1  0 0 1 1  PHQ - 2 Score 2 4 2  0 0 2 2  Altered sleeping - - - - - - -  Tired, decreased energy - - - - - - -  Change in appetite - - - - - - -  Feeling bad or failure about yourself  - - - - - - -  Trouble concentrating - - - - - - -  Moving slowly or fidgety/restless - - - - - - -  Suicidal thoughts - - - - - - -  PHQ-9 Score - - - - - - -    Review of Systems  Constitutional: Negative.   HENT: Negative.   Eyes: Negative.   Respiratory: Negative.   Gastrointestinal: Positive for abdominal pain, constipation, diarrhea and nausea.  Endocrine: Negative.   Genitourinary: Positive for frequency and urgency.       Incontinence  Musculoskeletal: Positive for back pain and gait problem.  Skin: Negative.   Allergic/Immunologic: Negative.   Neurological: Positive for weakness and numbness.       Tingling  Psychiatric/Behavioral: Positive for dysphoric mood.  All other systems reviewed and are negative.      Objective:   Physical Exam Vitals signs and nursing note reviewed.  Constitutional:      Appearance: Normal appearance.  Neck:     Musculoskeletal: Normal range of motion and neck supple.  Cardiovascular:     Rate and Rhythm: Normal rate and regular rhythm.     Pulses: Normal pulses.     Heart sounds: Normal heart sounds.  Pulmonary:     Effort: Pulmonary effort is normal.     Breath sounds: Normal breath sounds.  Musculoskeletal:     Comments: Normal Muscle Bulk and Muscle Testing Reveals:  Upper Extremities: Full ROM and Muscle Strength 5/5  Lumbar Hypersensitivity Bilateral Greater Trochanter Tenderness Lower Extremities: Full ROM and Muscle Strength 5/5 Arises from chair with ease using walker for support Narrow Based  Gait   Skin:    General: Skin is warm and dry.  Neurological:     Mental Status: She is alert and oriented to person, place, and time.  Psychiatric:         Mood and Affect: Mood normal.        Behavior: Behavior normal.           Assessment & Plan:  1. Cauda equina syndrome: Incontinence: Wearing Depends:Continue to Monitor.09/17/2018 2. Insomnia: Continue with Nortriptyline.09/17/2018 3. DDD L4-S1 s/p diskectomy with decompression of thecal sac: She continues to have back pain.Continue current medication regimen. 012/19/2019 Refilled: Percocet 10/325 mg #120--use 1 tablet every 6 hours as neededand MS Contin 30 mg one tablet every 12 hours as needed #60. Second script sent for the following month. We will continue the opioid monitoring program, this consists of regular clinic visits, examinations, urine drug screen, pill counts as well as use of [place] Controlled Substance Reporting System.. 4. Depression: Continue with Cymbalta. Continue to Monitor.09/17/2018 5. Anxiety: Continue withcurrent medication regimen withXanax. Continue to Monitor.09/17/2018 6.Neuropathy:Continue:with current medication nregimen with Pamelor. 09/17/2018 7. Constipation: Continue Senokot.09/17/2018 8.Muscle Spasm: Continue Baclofen.09/17/2018 9. Lumbar Radiculitis: Continue Pamelor.   20 minutes of face to face patient care time was spent during this visit. All questions were encouraged and answered.   F/U in 2 months

## 2018-09-25 DIAGNOSIS — I10 Essential (primary) hypertension: Secondary | ICD-10-CM | POA: Diagnosis not present

## 2018-09-25 DIAGNOSIS — E039 Hypothyroidism, unspecified: Secondary | ICD-10-CM | POA: Diagnosis not present

## 2018-09-25 DIAGNOSIS — M545 Low back pain: Secondary | ICD-10-CM | POA: Diagnosis not present

## 2018-09-25 DIAGNOSIS — K580 Irritable bowel syndrome with diarrhea: Secondary | ICD-10-CM | POA: Diagnosis not present

## 2018-10-01 ENCOUNTER — Other Ambulatory Visit: Payer: Self-pay | Admitting: Registered Nurse

## 2018-10-16 DIAGNOSIS — R5383 Other fatigue: Secondary | ICD-10-CM | POA: Diagnosis not present

## 2018-10-29 ENCOUNTER — Other Ambulatory Visit: Payer: Self-pay | Admitting: Physical Medicine & Rehabilitation

## 2018-11-20 ENCOUNTER — Encounter: Payer: Medicare Other | Admitting: Registered Nurse

## 2018-11-26 ENCOUNTER — Encounter: Payer: Medicare Other | Attending: Physical Medicine and Rehabilitation | Admitting: Registered Nurse

## 2018-11-26 ENCOUNTER — Encounter: Payer: Self-pay | Admitting: Registered Nurse

## 2018-11-26 VITALS — BP 114/82 | HR 80 | Ht 68.0 in | Wt 237.0 lb

## 2018-11-26 DIAGNOSIS — G834 Cauda equina syndrome: Secondary | ICD-10-CM | POA: Diagnosis not present

## 2018-11-26 DIAGNOSIS — M5136 Other intervertebral disc degeneration, lumbar region: Secondary | ICD-10-CM | POA: Diagnosis not present

## 2018-11-26 DIAGNOSIS — Z5181 Encounter for therapeutic drug level monitoring: Secondary | ICD-10-CM | POA: Diagnosis not present

## 2018-11-26 DIAGNOSIS — Z79899 Other long term (current) drug therapy: Secondary | ICD-10-CM | POA: Diagnosis not present

## 2018-11-26 DIAGNOSIS — F411 Generalized anxiety disorder: Secondary | ICD-10-CM

## 2018-11-26 DIAGNOSIS — M961 Postlaminectomy syndrome, not elsewhere classified: Secondary | ICD-10-CM | POA: Diagnosis not present

## 2018-11-26 DIAGNOSIS — G039 Meningitis, unspecified: Secondary | ICD-10-CM | POA: Diagnosis not present

## 2018-11-26 DIAGNOSIS — M51369 Other intervertebral disc degeneration, lumbar region without mention of lumbar back pain or lower extremity pain: Secondary | ICD-10-CM

## 2018-11-26 DIAGNOSIS — M5416 Radiculopathy, lumbar region: Secondary | ICD-10-CM

## 2018-11-26 DIAGNOSIS — M7061 Trochanteric bursitis, right hip: Secondary | ICD-10-CM

## 2018-11-26 DIAGNOSIS — F3289 Other specified depressive episodes: Secondary | ICD-10-CM

## 2018-11-26 DIAGNOSIS — G894 Chronic pain syndrome: Secondary | ICD-10-CM

## 2018-11-26 DIAGNOSIS — M7062 Trochanteric bursitis, left hip: Secondary | ICD-10-CM

## 2018-11-26 DIAGNOSIS — G4700 Insomnia, unspecified: Secondary | ICD-10-CM

## 2018-11-26 DIAGNOSIS — M62838 Other muscle spasm: Secondary | ICD-10-CM

## 2018-11-26 MED ORDER — MORPHINE SULFATE ER 30 MG PO TBCR: 30.0000 mg | EXTENDED_RELEASE_TABLET | Freq: Two times a day (BID) | ORAL | 0 refills | Status: DC

## 2018-11-26 MED ORDER — OXYCODONE-ACETAMINOPHEN 10-325 MG PO TABS: 1.0000 | ORAL_TABLET | Freq: Four times a day (QID) | ORAL | 0 refills | Status: DC | PRN

## 2018-11-26 MED ORDER — ALPRAZOLAM 0.25 MG PO TABS: ORAL_TABLET | ORAL | 2 refills | Status: DC

## 2018-11-26 NOTE — Progress Notes (Signed)
Subjective:    Patient ID: Sherry Wong, female    DOB: May 29, 1956, 63 y.o.   MRN: [id number]  HPI: Sherry Wong is a 63 y.o. female who returns for follow up appointment for chronic pain and medication refill. She states her pain is located in her lower back radiating into her bilateral lower extremities and bilateral feet pain. Also reports bilateral hip pain. She rates her pain 5. Her current exercise regime is walking and attending [employer] three days a week.   Ms. Drouillard Morphine equivalent is 120.00 MME.  She is also prescribed Alprazolam. We have discussed the black box warning of using opioids and benzodiazepines. I highlighted the dangers of using these drugs together and discussed the adverse events including respiratory suppression, overdose, cognitive impairment and importance of compliance with current regimen. We will continue to monitor and adjust as indicated.   Last UDs was Performed on 07/20/2018, it was consistent.   Pain Inventory Average Pain 7 Pain Right Now 5 My pain is sharp, burning, dull, stabbing, tingling and aching  In the last 24 hours, has pain interfered with the following? General activity 9 Relation with others 9 Enjoyment of life 9 What TIME of day is your pain at its worst? all Sleep (in general) Fair  Pain is worse with: walking, bending, sitting, standing and some activites Pain improves with: rest, heat/ice and medication Relief from Meds: 5  Mobility walk with assistance use a cane ability to climb steps?  yes do you drive?  yes  Function disabled: date disabled .  Neuro/Psych bladder control problems bowel control problems weakness numbness tingling trouble walking spasms dizziness depression anxiety  Prior Studies Any changes since last visit?  no  Physicians involved in your care Any changes since last visit?  no   Family History  Problem Relation Age of Onset  . Anesthesia problems Mother   .  Anesthesia problems Sister   . Hypotension Neg Hx   . Malignant hyperthermia Neg Hx   . Pseudochol deficiency Neg Hx    Social History   Socioeconomic History  . Marital status: Married    Spouse name: Not on file  . Number of children: 0  . Years of education: 37  . Highest education level: Not on file  Occupational History  . Occupation: Disabled  Social Needs  . Financial resource strain: Not on file  . Food insecurity:    Worry: Not on file    Inability: Not on file  . Transportation needs:    Medical: Not on file    Non-medical: Not on file  Tobacco Use  . Smoking status: Never Smoker  . Smokeless tobacco: Never Used  Substance and Sexual Activity  . Alcohol use: Yes    Alcohol/week: 0.0 standard drinks    Comment: occ- 0.5 can beer every 2 months  . Drug use: No  . Sexual activity: Not Currently  Lifestyle  . Physical activity:    Days per week: Not on file    Minutes per session: Not on file  . Stress: Not on file  Relationships  . Social connections:    Talks on phone: Not on file    Gets together: Not on file    Attends religious service: Not on file    Active member of club or organization: Not on file    Attends meetings of clubs or organizations: Not on file    Relationship status: Not on file  Other Topics Concern  .  Not on file  Social History Narrative   Lives at home with husband.   Caffeine use: 1 cup coffee 3 times per week.    Past Surgical History:  Procedure Laterality Date  . BREAST BIOPSY     right  . BREAST SURGERY     left breast lumpectomy  . CHOLECYSTECTOMY    . COLONOSCOPY    . cysts removed from ovary    . DILATION AND CURETTAGE OF UTERUS    . ESOPHAGOGASTRODUODENOSCOPY    . HEMATOMA EVACUATION N/A 08/25/2013   Procedure: EVACUATION OF LUMBAR HEMATOMA;  Surgeon: Floyce Stakes, MD;  Location: [facility];  Service: Neurosurgery;  Laterality: N/A;  . LUMBAR LAMINECTOMY/DECOMPRESSION MICRODISCECTOMY  12/03/2011   Procedure:  LUMBAR LAMINECTOMY/DECOMPRESSION MICRODISCECTOMY 2 LEVELS;  Surgeon: Floyce Stakes, MD;  Location: [place] [facility];  Service: Neurosurgery;  Laterality: Right;  Right Lumbar four-five Lumbar five sacral one Foraminotomies  . LUMBAR WOUND DEBRIDEMENT N/A 09/01/2013   Procedure: LUMBAR WOUND DEBRIDEMENT;  Surgeon: Floyce Stakes, MD;  Location: [facility];  Service: Neurosurgery;  Laterality: N/A;  . PLACEMENT OF LUMBAR DRAIN N/A 09/01/2013   Procedure: PLACEMENT OF LUMBAR DRAIN;  Surgeon: Floyce Stakes, MD;  Location: [facility];  Service: Neurosurgery;  Laterality: N/A;  . TRANSFORAMINAL LUMBAR INTERBODY FUSION (TLIF) WITH PEDICLE SCREW FIXATION 2 LEVEL N/A 08/25/2013   Procedure:  Lumbar four-five, Lumbar five-Sacral one Transforaminal Lumbar Interbody Fusion ;  Surgeon: Floyce Stakes, MD;  Location: [place] [facility];  Service: Neurosurgery;  Laterality: N/A;   Past Medical History:  Diagnosis Date  . Anxiety    takes celexa daily  . Arthritis   . Asthma   . Bronchitis   . Chronic back pain    ddd/lumbago,and radiculopathy  . Depression   . Gastric ulcer   . GERD (gastroesophageal reflux disease)    takes Protonix daily  . Gout    hx of  . H/O hiatal hernia   . Hx of colonic polyps   . Hypertension    takes Losartan daily  . Hypothyroidism    takes Levothyroxine daily  . IBS (irritable bowel syndrome)   . Nocturia   . Pneumonia    hx of 15+yrs ago  . PONV (postoperative nausea and vomiting)   . Ulcerative colitis   . Urinary incontinence    BP 114/82   Pulse 80   Ht 5\' 8"  (1.727 m)   Wt 237 lb (107.5 kg)   SpO2 96%   BMI 36.04 kg/m   Opioid Risk Score:   Fall Risk Score:  `1  Depression screen PHQ 2/9  Depression screen [facility] 2/9 07/20/2018 07/24/2017 01/22/2017 05/22/2016 03/20/2016 12/20/2015 05/23/2015  Decreased Interest 1 2 1  0 0 1 1  Down, Depressed, Hopeless 1 2 1  0 0 1 1  PHQ - 2 Score 2 4 2  0 0 2 2  Altered sleeping - - - - - - -  Tired, decreased  energy - - - - - - -  Change in appetite - - - - - - -  Feeling bad or failure about yourself  - - - - - - -  Trouble concentrating - - - - - - -  Moving slowly or fidgety/restless - - - - - - -  Suicidal thoughts - - - - - - -  PHQ-9 Score - - - - - - -     Review of Systems  Constitutional: Negative.  HENT: Negative.   Eyes: Negative.   Respiratory: Negative.   Cardiovascular: Negative.   Gastrointestinal: Positive for abdominal pain, constipation, diarrhea and nausea.  Endocrine: Negative.   Genitourinary: Positive for difficulty urinating.  Musculoskeletal: Positive for arthralgias, back pain, gait problem and myalgias.  Skin: Negative.   Allergic/Immunologic: Negative.   Neurological: Positive for dizziness, weakness and numbness.  Hematological: Negative.   Psychiatric/Behavioral: Positive for dysphoric mood. The patient is nervous/anxious.   All other systems reviewed and are negative.      Objective:   Physical Exam Vitals signs and nursing note reviewed.  Constitutional:      Appearance: Normal appearance. She is obese.  Neck:     Musculoskeletal: Normal range of motion and neck supple.  Cardiovascular:     Rate and Rhythm: Normal rate and regular rhythm.     Pulses: Normal pulses.     Heart sounds: Normal heart sounds.  Pulmonary:     Effort: Pulmonary effort is normal.     Breath sounds: Normal breath sounds.  Musculoskeletal:     Comments: Normal Muscle Bulk and Muscle Testing Reveals:  Upper Extremities: Full ROM and Muscle Strength 5/5 , Lumbar Hypersensitivity Lower Extremities: Full ROM and Muscle Strength 5/5 Arises from chair slowly using cane for support Narrow Based Gait   Skin:    General: Skin is warm and dry.  Neurological:     Mental Status: She is alert and oriented to person, place, and time.  Psychiatric:        Mood and Affect: Mood normal.        Behavior: Behavior normal.           Assessment & Plan:  1. Cauda equina  syndrome: Incontinence: Wearing Depends:Continue to Monitor.11/26/2018 2. Insomnia: Continue with Nortriptyline.11/26/2018 3. DDD L4-S1 s/p diskectomy with decompression of thecal sac: She continues to have back pain.Continue current medication regimen. 11/26/2018 Refilled: Percocet 10/325 mg #120--use 1 tablet every 6 hours as neededand MS Contin 30 mg one tablet every 12 hours as needed #60. Second script sent for the following month. We will continue the opioid monitoring program, this consists of regular clinic visits, examinations, urine drug screen, pill counts as well as use of [place] Controlled Substance Reporting System.. 4. Depression: Continue with Cymbalta. Continue to Monitor.11/26/2018 5. Anxiety: Continue withcurrent medication regimen withXanax. Continue to Monitor.11/26/2018 6.Neuropathy:Continue:with current medication nregimen with Pamelor. 11/26/2018 7. Constipation: Continue Senokot.11/26/2018 8.Muscle Spasm: Continue Baclofen.11/26/2018 9. Lumbar Radiculitis: Continue Pamelor. 11/26/2018  20 minutes of face to face patient care time was spent during this visit. All questions were encouraged and answered.   F/U in 2 months

## 2019-01-06 ENCOUNTER — Emergency Department (HOSPITAL_COMMUNITY)
Admission: EM | Admit: 2019-01-06 | Discharge: 2019-01-06 | Disposition: A | Discharge status: Home / Self Care | Payer: Medicare Other | Attending: Emergency Medicine | Admitting: Emergency Medicine

## 2019-01-06 ENCOUNTER — Encounter (HOSPITAL_COMMUNITY): Payer: Self-pay | Admitting: Emergency Medicine

## 2019-01-06 ENCOUNTER — Other Ambulatory Visit: Payer: Self-pay

## 2019-01-06 DIAGNOSIS — Z79899 Other long term (current) drug therapy: Secondary | ICD-10-CM | POA: Diagnosis not present

## 2019-01-06 DIAGNOSIS — E039 Hypothyroidism, unspecified: Secondary | ICD-10-CM | POA: Insufficient documentation

## 2019-01-06 DIAGNOSIS — E876 Hypokalemia: Secondary | ICD-10-CM | POA: Diagnosis not present

## 2019-01-06 DIAGNOSIS — I10 Essential (primary) hypertension: Secondary | ICD-10-CM | POA: Insufficient documentation

## 2019-01-06 DIAGNOSIS — J45909 Unspecified asthma, uncomplicated: Secondary | ICD-10-CM | POA: Insufficient documentation

## 2019-01-06 DIAGNOSIS — J019 Acute sinusitis, unspecified: Secondary | ICD-10-CM | POA: Diagnosis not present

## 2019-01-06 DIAGNOSIS — R040 Epistaxis: Secondary | ICD-10-CM | POA: Diagnosis not present

## 2019-01-06 LAB — CBC WITH DIFFERENTIAL/PLATELET
Abs Immature Granulocytes: 0.01 10*3/uL (ref 0.00–0.07)
Basophils Absolute: 0.1 10*3/uL (ref 0.0–0.1)
Basophils Relative: 1 %
Eosinophils Absolute: 0.4 10*3/uL (ref 0.0–0.5)
Eosinophils Relative: 8 %
HCT: 36.8 % (ref 36.0–46.0)
Hemoglobin: 11.9 g/dL — ABNORMAL LOW (ref 12.0–15.0)
Immature Granulocytes: 0 %
Lymphocytes Relative: 24 %
Lymphs Abs: 1.3 10*3/uL (ref 0.7–4.0)
MCH: 29.5 pg (ref 26.0–34.0)
MCHC: 32.3 g/dL (ref 30.0–36.0)
MCV: 91.1 fL (ref 80.0–100.0)
Monocytes Absolute: 0.4 10*3/uL (ref 0.1–1.0)
Monocytes Relative: 7 %
Neutro Abs: 3.2 10*3/uL (ref 1.7–7.7)
Neutrophils Relative %: 60 %
Platelets: 240 10*3/uL (ref 150–400)
RBC: 4.04 MIL/uL (ref 3.87–5.11)
RDW: 13.5 % (ref 11.5–15.5)
WBC: 5.3 10*3/uL (ref 4.0–10.5)
nRBC: 0 % (ref 0.0–0.2)

## 2019-01-06 LAB — BASIC METABOLIC PANEL
Anion gap: 8 (ref 5–15)
BUN: 12 mg/dL (ref 8–23)
CO2: 30 mmol/L (ref 22–32)
Calcium: 8.6 mg/dL — ABNORMAL LOW (ref 8.9–10.3)
Chloride: 98 mmol/L (ref 98–111)
Creatinine, Ser: 0.69 mg/dL (ref 0.44–1.00)
GFR calc Af Amer: >60 mL/min (ref >60)
GFR calc non Af Amer: >60 mL/min (ref >60)
Glucose, Bld: 136 mg/dL — ABNORMAL HIGH (ref 70–99)
Potassium: 2.7 mmol/L — CL (ref 3.5–5.1)
Sodium: 136 mmol/L (ref 135–145)

## 2019-01-06 MED ORDER — POTASSIUM CHLORIDE CRYS ER 20 MEQ PO TBCR: 20.0000 meq | EXTENDED_RELEASE_TABLET | Freq: Two times a day (BID) | ORAL | 0 refills | Status: DC

## 2019-01-06 MED ORDER — POTASSIUM CHLORIDE CRYS ER 20 MEQ PO TBCR
40.0000 meq | EXTENDED_RELEASE_TABLET | Freq: Once | ORAL | Status: AC
  Administered 2019-01-06: 20:00:00 40 meq via ORAL
  Filled 2019-01-06: qty 2

## 2019-01-06 MED ORDER — OXYMETAZOLINE HCL 0.05 % NA SOLN
1.0000 | Freq: Once | NASAL | Status: AC
  Administered 2019-01-06: 1 via NASAL

## 2019-01-06 MED ORDER — OXYMETAZOLINE HCL 0.05 % NA SOLN
NASAL | Status: AC
  Administered 2019-01-06: 1 via NASAL
  Filled 2019-01-06: qty 30

## 2019-01-06 NOTE — ED Triage Notes (Signed)
Several nose bleeds since 10am today, passing clots. Denies being on blood thinners. A/o. Ambulatory.

## 2019-01-06 NOTE — Discharge Instructions (Addendum)
Your potassium today was low. A prescription was sent to your pharmacy for potassium chloride SA (K-[id number]). This is a potassium replacement, please take as prescribed for the next 5 days beginning tomorrow.  Follow-up with your primary care physician for reevaluation of your low potassium after you finish the prescription to recheck your potassium levels to make sure they have improved.  __________________________________   Nosebleed  Should your nose begin bleeding again, hold pressure (HARD!) just below the bony part of your nose.  Hold pressure continuously for 15 minutes.  If bleeding continues, blow your nose to get rid of any clots.  Spray afrin x 2 into affected nostril.  Then apply pressure again, this time for 30 minutes.  Should your nose continue to bleed after this time, return to the ER for further treatment.   EPISTAXIS: You have been seen for Epistaxis (bloody nose).  Epistaxis in the medical term for a bloody nose. Epistaxis is a common condition and although frightening, it seldom becomes serious. The skin in the front of the nose is very fragile and in children is most often damaged by direct trauma (such as nose-picking) or when the skin becomes very dry or irritated (such as in dry environments on when you have a cold).  The bleeding often begins at the end of the nose, so pinching the entire nose for 15 minutes is often effective at stopping the bleeding.  When the bleeding area can be found, the doctor may will use a chemical called Silver Nitrate to stop the bleeding.  Other chemicals are sometimes used to stop the bleeding by causing the oozing blood to clot.  You may have had either or both of these treatments done today.  If the bleeding does not stop with chemical treatment, packing may be placed into the nose.  Although packing is sometimes uncomfortable, it is usually very effective at stopping a bloody nose.  Sometimes the bleeding starts from a blood vessel way in the  back of the nose or in the throat. This type of bleeding is difficult to control and often requires nasal packing by an Ear, Nose and Throat specialist.  Applying ice to the forehead or back of the neck is not effective and WILL NOT stop the bleeding. Instead, press or squeeze the nose directly.  If the bleeding doesnt stop in 15 minutes, return here or go to the nearest Emergency Department.  Avoid aspirin and non-steroidal anti-inflammatory medications (Advil, Motrin, Ibuprofen, Aleve, Naprosyn, etc.) and alcohol. These medications will make it difficult for your blood to clot.  Avoid blowing your nose, sneezing and straining for the next several days.  YOU SHOULD SEEK MEDICAL ATTENTION IMMEDIATELY, EITHER HERE OR AT THE NEAREST EMERGENCY DEPARTMENT, IF ANY OF THE FOLLOWING OCCURS:      You are unable to stop the bleeding with direct pressure.     You experience bleeding down the back of the throat.     The packing gets out of place.  If you develop symptoms of Shortness of Breath, Chest Pain, Swelling of lips, mouth or tongue or if your condition becomes worse with any new symptoms, see your doctor or return to the Emergency Department for immediate care. Emergency services are not intended to be a substitute for comprehensive medical attention.  Please contact your doctor for follow up if not improving as expected.   Call your doctor in 5-7 days or as directed if there is no improvement.

## 2019-01-06 NOTE — ED Provider Notes (Signed)
[facility] EMERGENCY DEPARTMENT Provider Note   CSN: [id number] Arrival date & time: 01/06/19  1738    History   Chief Complaint Chief Complaint  Patient presents with   Epistaxis    HPI Sherry Wong is a 62 y.o. female with medical history significant for anxiety, asthma, bronchitis, chronic back pain, hypertension presents emergency room today with chief complaint of epistaxis x1 day. Pt does not take blood thinners.  Patient states around 10 AM this morning her nose started bleeding.  She went to her PCP 2 hours later and was advised to use an ice pack over her nose to stop bleeding.  The bleeding did not improve so she came to the emergency department.  Besides the ice pack patient has not tried any medications for her symptoms.  Patient denies any associated pain.  The bleeding is coming from the left nare and she admits to clots passing from that nare.  Patient also reports she feels as if she is swallowing blood in her throat feels scratchy.  Patient denies any trauma to her nose.  Denies fever, cough, shortness of breath, chest pain, abdominal pain, nausea, vomiting.       Past Medical History:  Diagnosis Date   Anxiety    takes celexa daily   Arthritis    Asthma    Bronchitis    Chronic back pain    ddd/lumbago,and radiculopathy   Depression    Gastric ulcer    GERD (gastroesophageal reflux disease)    takes Protonix daily   Gout    hx of   H/O hiatal hernia    Hx of colonic polyps    Hypertension    takes Losartan daily   Hypothyroidism    takes Levothyroxine daily   IBS (irritable bowel syndrome)    Nocturia    Pneumonia    hx of 15+yrs ago   PONV (postoperative nausea and vomiting)    Ulcerative colitis    Urinary incontinence     Patient Active Problem List   Diagnosis Date Noted   Intention tremor 08/22/2015   Myoclonus 02/20/2015   Snoring 02/01/2015   Periodic limb movements of sleep 02/01/2015   Jerking movements  of extremities 02/01/2015   Excessive daytime sleepiness 02/01/2015   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   Hypokalemia 09/20/2013   Depressive disorder, not elsewhere classified 09/20/2013   Bronchitis, chronic with acute exacerbation ([place]) 09/20/2013   Acute blood loss anemia 09/20/2013   Cauda equina syndrome ([place]) 09/08/2013   Morbid obesity ([place]) 08/26/2013   HTN (hypertension) 08/26/2013   Hypoxemia 08/26/2013   Unspecified hypothyroidism 08/26/2013   Lumbar degenerative disc disease 08/25/2013    Past Surgical History:  Procedure Laterality Date   BREAST BIOPSY     right   BREAST SURGERY     left breast lumpectomy   CHOLECYSTECTOMY     COLONOSCOPY     cysts removed from ovary     DILATION AND CURETTAGE OF UTERUS     ESOPHAGOGASTRODUODENOSCOPY     HEMATOMA EVACUATION N/A 08/25/2013   Procedure: EVACUATION OF LUMBAR HEMATOMA;  Surgeon: Floyce Stakes, MD;  Location: [facility] NEURO ORS;  Service: Neurosurgery;  Laterality: N/A;   LUMBAR LAMINECTOMY/DECOMPRESSION MICRODISCECTOMY  12/03/2011   Procedure: LUMBAR LAMINECTOMY/DECOMPRESSION MICRODISCECTOMY 2 LEVELS;  Surgeon: Floyce Stakes, MD;  Location: [place] NEURO ORS;  Service: Neurosurgery;  Laterality: Right;  Right Lumbar four-five Lumbar five sacral one Foraminotomies   LUMBAR WOUND DEBRIDEMENT N/A 09/01/2013   Procedure: LUMBAR WOUND DEBRIDEMENT;  Surgeon: Floyce Stakes, MD;  Location: [facility] NEURO ORS;  Service: Neurosurgery;  Laterality: N/A;   PLACEMENT OF LUMBAR DRAIN N/A 09/01/2013   Procedure: PLACEMENT OF LUMBAR DRAIN;  Surgeon: Floyce Stakes, MD;  Location: [facility] NEURO ORS;  Service: Neurosurgery;  Laterality: N/A;   TRANSFORAMINAL LUMBAR INTERBODY FUSION (TLIF) WITH PEDICLE SCREW FIXATION 2 LEVEL N/A 08/25/2013   Procedure:  Lumbar four-five, Lumbar five-Sacral one Transforaminal Lumbar Interbody Fusion ;  Surgeon: Floyce Stakes, MD;  Location: [place] NEURO ORS;  Service: Neurosurgery;  Laterality: N/A;     OB  History   No obstetric history on file.      Home Medications    Prior to Admission medications   Medication Sig Start Date End Date Taking? Authorizing Provider  albuterol (PROVENTIL HFA;VENTOLIN HFA) 108 (90 BASE) MCG/ACT inhaler Inhale 2 puffs into the lungs 2 (two) times daily.    [provider]  ALPRAZolam Duanne Moron) 0.25 MG tablet TAKE 1 TABLET 2 TIMES A DAY AS NEEDED FOR ANXIETY. 11/26/18   Bayard Hugger, NP  baclofen (LIORESAL) 20 MG tablet TAKE 1 TABLET BY MOUTH 3 TIMES DAILY. 06/24/18   Meredith Staggers, MD  DULoxetine (CYMBALTA) 60 MG capsule Take 60 mg by mouth daily.    [provider]  hydrochlorothiazide (HYDRODIURIL) 25 MG tablet  05/25/14   [provider]  levothyroxine (SYNTHROID, LEVOTHROID) 150 MCG tablet Take 150 mcg by mouth daily before breakfast.    [provider]  morphine (MS CONTIN) 30 MG 12 hr tablet Take 1 tablet (30 mg total) by mouth every 12 (twelve) hours. 11/26/18   Bayard Hugger, NP  nortriptyline (PAMELOR) 50 MG capsule Take 1 capsule (50 mg total) by mouth at bedtime. 09/17/18   Bayard Hugger, NP  oxyCODONE-acetaminophen (PERCOCET) 10-325 MG tablet Take 1 tablet by mouth every 6 (six) hours as needed for pain (for severe pain). May have extra tablet on days when pain is severe. 11/26/18   Bayard Hugger, NP  pantoprazole (PROTONIX) 40 MG tablet Take 40 mg by mouth daily.    [provider]  potassium chloride SA (K-DUR,KLOR-CON) 20 MEQ tablet Take 1 tablet (20 mEq total) by mouth 2 (two) times daily for 5 days. 01/06/19 01/11/19  Aidaly Cordner E, PA-C  senna-docusate (SENOKOT-S) 8.6-50 MG per tablet Take 2 tablets by mouth at bedtime. Bowel program--over the counter 09/28/13   Love, Ivan Anchors, PA-C    Family History Family History  Problem Relation Age of Onset   Anesthesia problems Mother    Anesthesia problems Sister    Hypotension Neg Hx    Malignant hyperthermia Neg Hx    Pseudochol  deficiency Neg Hx     Social History Social History   Tobacco Use   Smoking status: Never Smoker   Smokeless tobacco: Never Used  Substance Use Topics   Alcohol use: Yes    Alcohol/week: 0.0 standard drinks    Comment: occ- 0.5 can beer every 2 months   Drug use: No     Allergies   Codeine and Tegretol [carbamazepine]   Review of Systems Review of Systems  Constitutional: Negative for chills and fever.  HENT: Positive for nosebleeds and sore throat. Negative for congestion, ear discharge, ear pain, sinus pressure and sinus pain.   Eyes: Negative for pain and redness.  Respiratory: Negative for cough and shortness of breath.   Cardiovascular: Negative for chest pain.  Gastrointestinal: Negative for abdominal pain, constipation, diarrhea, nausea and  vomiting.  Genitourinary: Negative for dysuria and hematuria.  Musculoskeletal: Negative for back pain and neck pain.  Skin: Negative for wound.  Neurological: Negative for weakness, numbness and headaches.     Physical Exam Updated Vital Signs BP 110/70    Pulse 87    Temp 98.1 F (36.7 C) (Oral)    Resp 17    SpO2 94%   Physical Exam Vitals signs and nursing note reviewed.  HENT:     Head: Normocephalic and atraumatic.     Nose: No nasal tenderness.     Right Nostril: No foreign body, septal hematoma or occlusion.     Left Nostril: Epistaxis present. No foreign body, septal hematoma or occlusion.     Right Turbinates: Enlarged.     Left Turbinates: Not swollen.     Right Sinus: No maxillary sinus tenderness or frontal sinus tenderness.     Left Sinus: No maxillary sinus tenderness or frontal sinus tenderness.     Mouth/Throat:     Comments: Blood seen in posterior oropharynx. No erythema to oropharynx, no edema, voice normal.  Eyes:     General: No scleral icterus.    Conjunctiva/sclera: Conjunctivae normal.  Neck:     Musculoskeletal: Normal range of motion.  Cardiovascular:     Rate and Rhythm: Normal  rate and regular rhythm.     Pulses: Normal pulses.          Radial pulses are 2+ on the right side and 2+ on the left side.     Heart sounds: Normal heart sounds.  Pulmonary:     Effort: Pulmonary effort is normal.     Breath sounds: Normal breath sounds.  Abdominal:     General: There is no distension.     Palpations: Abdomen is soft.     Tenderness: There is no abdominal tenderness.  Musculoskeletal: Normal range of motion.  Skin:    General: Skin is warm and dry.  Neurological:     Mental Status: She is alert and oriented to person, place, and time.  Psychiatric:        Behavior: Behavior normal.      ED Treatments / Results  Labs (all labs ordered are listed, but only abnormal results are displayed) Labs Reviewed  CBC WITH DIFFERENTIAL/PLATELET - Abnormal; Notable for the following components:      Result Value   Hemoglobin 11.9 (*)    All other components within normal limits  BASIC METABOLIC PANEL - Abnormal; Notable for the following components:   Potassium 2.7 (*)    Glucose, Bld 136 (*)    Calcium 8.6 (*)    All other components within normal limits    EKG None  Radiology No results found.  Procedures Procedures (including critical care time)  Medications Ordered in ED Medications  oxymetazoline (AFRIN) 0.05 % nasal spray 1 spray (1 spray Each Nare Given 01/06/19 1758)  potassium chloride SA (K-DUR,KLOR-CON) CR tablet 40 mEq (40 mEq Oral Given 01/06/19 2008)     Initial Impression / Assessment and Plan / ED Course  I have reviewed the triage vital signs and the nursing notes.  Pertinent labs & imaging results that were available during my care of the patient were reviewed by me and considered in my medical decision making (see chart for details).    Pt is well appearing, in no acute distress. She presents today with intermittent epistaxis x 8 hours. DDX includes anterior vs posterior bleed, allergic rhinitis,trauma, less likely DIC, foreign body.  She  was seen by her pcp who recommend pt apply an ice pack to her nose hoping to avoid having to send pt to the emergency department. Unfortunately the epistaxis did not resolve with ice. Pt given afrin nasal spray in left nare in triage prior to my evaluation. Bleeding has slowed down but continues. Will initiate work up with CBC, BMP and apply direct pressure to nose and reassess.  BMP shows hypokalemia of 2.7, replaced with PO potassium and will discharge with short course of PO potassium. Recommended pt to have potassium rechecked by pcp upon completion. CBC shows hemoglobin of 11.9. After removing clips providing direct pressure it appears bleeding has stopped.  Patient is hemodynamically stable, in NAD. Evaluation does not show pathology that would require ongoing emergent intervention or inpatient treatment. I explained the diagnosis to the patient. Patient is comfortable with above plan and is stable for discharge at this time. All questions were answered prior to disposition. Strict return precautions for returning to the ED were discussed. Pt is reliable for pcp follow up. Pt case discussed with Dr. Lita Mains who agrees with my plan.     This note was prepared with assistance of Systems analyst. Occasional wrong-word or sound-a-like substitutions may have occurred due to the inherent limitations of voice recognition software.    Final Clinical Impressions(s) / ED Diagnoses   Final diagnoses:  Left-sided epistaxis  Hypokalemia    ED Discharge Orders         Ordered    potassium chloride SA (K-DUR,KLOR-CON) 20 MEQ tablet  2 times daily     01/06/19 2009           Flint Melter 01/07/19 0055    Julianne Rice, MD 01/07/19 [phone]

## 2019-01-06 NOTE — ED Notes (Signed)
CRITICAL VALUE ALERT  Critical Value:  Potassium 2.7  Date & Time Notied:  01/06/2019 1949  Provider Notified: Nile Riggs                               Albrizze, PA  Orders Received/Actions taken: PO potassium

## 2019-01-06 NOTE — ED Notes (Signed)
ED Provider at bedside. 

## 2019-01-13 DIAGNOSIS — E782 Mixed hyperlipidemia: Secondary | ICD-10-CM | POA: Diagnosis not present

## 2019-01-13 DIAGNOSIS — R609 Edema, unspecified: Secondary | ICD-10-CM | POA: Diagnosis not present

## 2019-01-13 DIAGNOSIS — E039 Hypothyroidism, unspecified: Secondary | ICD-10-CM | POA: Diagnosis not present

## 2019-01-13 DIAGNOSIS — K219 Gastro-esophageal reflux disease without esophagitis: Secondary | ICD-10-CM | POA: Diagnosis not present

## 2019-01-26 ENCOUNTER — Encounter: Payer: Medicare Other | Attending: Physical Medicine and Rehabilitation | Admitting: Registered Nurse

## 2019-01-26 ENCOUNTER — Other Ambulatory Visit: Payer: Self-pay

## 2019-01-26 ENCOUNTER — Encounter: Payer: Self-pay | Admitting: Registered Nurse

## 2019-01-26 VITALS — BP 126/80 | Ht 68.0 in | Wt 230.0 lb

## 2019-01-26 DIAGNOSIS — G039 Meningitis, unspecified: Secondary | ICD-10-CM

## 2019-01-26 DIAGNOSIS — Z79899 Other long term (current) drug therapy: Secondary | ICD-10-CM

## 2019-01-26 DIAGNOSIS — M961 Postlaminectomy syndrome, not elsewhere classified: Secondary | ICD-10-CM

## 2019-01-26 DIAGNOSIS — G834 Cauda equina syndrome: Secondary | ICD-10-CM | POA: Diagnosis not present

## 2019-01-26 DIAGNOSIS — F3289 Other specified depressive episodes: Secondary | ICD-10-CM

## 2019-01-26 DIAGNOSIS — M5416 Radiculopathy, lumbar region: Secondary | ICD-10-CM

## 2019-01-26 DIAGNOSIS — Z5181 Encounter for therapeutic drug level monitoring: Secondary | ICD-10-CM

## 2019-01-26 DIAGNOSIS — M62838 Other muscle spasm: Secondary | ICD-10-CM

## 2019-01-26 DIAGNOSIS — F411 Generalized anxiety disorder: Secondary | ICD-10-CM

## 2019-01-26 DIAGNOSIS — M5136 Other intervertebral disc degeneration, lumbar region: Secondary | ICD-10-CM | POA: Diagnosis not present

## 2019-01-26 DIAGNOSIS — G894 Chronic pain syndrome: Secondary | ICD-10-CM

## 2019-01-26 DIAGNOSIS — M51369 Other intervertebral disc degeneration, lumbar region without mention of lumbar back pain or lower extremity pain: Secondary | ICD-10-CM

## 2019-01-26 MED ORDER — MORPHINE SULFATE ER 30 MG PO TBCR: 30.0000 mg | EXTENDED_RELEASE_TABLET | Freq: Two times a day (BID) | ORAL | 0 refills | Status: DC

## 2019-01-26 MED ORDER — OXYCODONE-ACETAMINOPHEN 10-325 MG PO TABS: 1.0000 | ORAL_TABLET | Freq: Four times a day (QID) | ORAL | 0 refills | Status: DC | PRN

## 2019-01-26 NOTE — Progress Notes (Signed)
Subjective:    Patient ID: Sherry Wong, female    DOB: [birth year], 63 y.o.   MRN: [id number]  HPI: Sherry Wong is a 63 y.o. female her appointment was changed, due to national recommendations of social distancing due to [place] 19, an audio/video telehealth visit is felt to be most appropriate for this patient at this time.  See Chart message from today for the patient's consent to telehealth from [place].   She states her  pain is located in her lower back radiating into her bilateral lower extremities and bilateral feet. She rates her pain 6. Her current exercise regime is walking.  Sherry Wong went to [facility] Emergency Department on 01/06/2019 for left side epistaxis, note was reviewed.   Sherry Wong Morphine equivalent is 120.00 MME.  She is also prescribed Alprazolam.We have discussed the black box warning of using opioids and benzodiazepines. I highlighted the dangers of using these drugs together and discussed the adverse events including respiratory suppression, overdose, cognitive impairment and importance of compliance with current regimen. We will continue to monitor and adjust as indicated.  Last UDS was Performed on 07/20/2018, it was consistent.   [name] CMA asked the Health and History Questions. This provider and Kennon Rounds verified we were speaking with the correct person using two identifiers.   Pain Inventory Average Pain 7 Pain Right Now 6 My pain is sharp, burning, dull, stabbing, tingling and aching  In the last 24 hours, has pain interfered with the following? General activity 5 Relation with others 5 Enjoyment of life 5 What TIME of day is your pain at its worst? all Sleep (in general) Fair  Pain is worse with: walking, bending, sitting, standing and some activites Pain improves with: rest, heat/ice and medication Relief from Meds: 5  Mobility walk with assistance use a cane ability to climb steps?  yes  do you drive?  yes  Function disabled: date disabled na  Neuro/Psych bladder control problems bowel control problems weakness numbness tingling trouble walking spasms dizziness depression anxiety  Prior Studies Any changes since last visit?  no  Physicians involved in your care Any changes since last visit?  yes Primary care Carolee Rota   Family History  Problem Relation Age of Onset  . Anesthesia problems Mother   . Anesthesia problems Sister   . Hypotension Neg Hx   . Malignant hyperthermia Neg Hx   . Pseudochol deficiency Neg Hx    Social History   Socioeconomic History  . Marital status: Married    Spouse name: Not on file  . Number of children: 0  . Years of education: 71  . Highest education level: Not on file  Occupational History  . Occupation: Disabled  Social Needs  . Financial resource strain: Not on file  . Food insecurity:    Worry: Not on file    Inability: Not on file  . Transportation needs:    Medical: Not on file    Non-medical: Not on file  Tobacco Use  . Smoking status: Never Smoker  . Smokeless tobacco: Never Used  Substance and Sexual Activity  . Alcohol use: Yes    Alcohol/week: 0.0 standard drinks    Comment: occ- 0.5 can beer every 2 months  . Drug use: No  . Sexual activity: Not Currently  Lifestyle  . Physical activity:    Days per week: Not on file    Minutes per session: Not on file  .  Stress: Not on file  Relationships  . Social connections:    Talks on phone: Not on file    Gets together: Not on file    Attends religious service: Not on file    Active member of club or organization: Not on file    Attends meetings of clubs or organizations: Not on file    Relationship status: Not on file  Other Topics Concern  . Not on file  Social History Narrative   Lives at home with husband.   Caffeine use: 1 cup coffee 3 times per week.    Past Surgical History:  Procedure Laterality Date  . BREAST BIOPSY      right  . BREAST SURGERY     left breast lumpectomy  . CHOLECYSTECTOMY    . COLONOSCOPY    . cysts removed from ovary    . DILATION AND CURETTAGE OF UTERUS    . ESOPHAGOGASTRODUODENOSCOPY    . HEMATOMA EVACUATION N/A 08/25/2013   Procedure: EVACUATION OF LUMBAR HEMATOMA;  Surgeon: Floyce Stakes, MD;  Location: [facility] NEUR[facility] ORS;  Service: Neurosurgery;  Laterality: N/A;  . LUMBAR LAMINECTOMY/DECOMPRESSION MICRODISCECTOMY  12/03/2011   Procedure: LUMBAR LAMINECTOMY/DECOMPRESSION MICRODISCECTOMY 2 LEVELS;  Surgeon: Floyce Stakes, MD;  Location: [place] NEURO ORS;  Service: Neurosurgery;  Laterality: Right;  Right Lumbar four-five Lumbar five sacral one Foraminotomies  . LUMBAR WOUND DEBRIDEMENT N/A 09/01/2013   Procedure: LUMBAR WOUND DEBRIDEMENT;  Surgeon: Floyce Stakes, MD;  Location: [facility] NEUR[facility] ORS;  Service: Neurosurgery;  Laterality: N/A;  . PLACEMENT OF LUMBAR DRAIN N/A 09/01/2013   Procedure: PLACEMENT OF LUMBAR DRAIN;  Surgeon: Floyce Stakes, MD;  Location: [facility] NEURO ORS;  Service: Neurosurgery;  Laterality: N/A;  . TRANSFORAMINAL LUMBAR INTERBODY FUSION (TLIF) WITH PEDICLE SCREW FIXATION 2 LEVEL N/A 08/25/2013   Procedure:  Lumbar four-five, Lumbar five-Sacral one Transforaminal Lumbar Interbody Fusion ;  Surgeon: Floyce Stakes, MD;  Location: [place] NEURO ORS;  Service: Neurosurgery;  Laterality: N/A;   Past Medical History:  Diagnosis Date  . Anxiety    takes celexa daily  . Arthritis   . Asthma   . Bronchitis   . Chronic back pain    ddd/lumbago,and radiculopathy  . Depression   . Gastric ulcer   . GERD (gastroesophageal reflux disease)    takes Protonix daily  . Gout    hx of  . H/O hiatal hernia   . Hx of colonic polyps   . Hypertension    takes Losartan daily  . Hypothyroidism    takes Levothyroxine daily  . IBS (irritable bowel syndrome)   . Nocturia   . Pneumonia    hx of 15+yrs ago  . PONV (postoperative nausea and vomiting)   . Ulcerative colitis   . Urinary  incontinence    BP 126/80 Comment: pt reported webex visit  Ht 5\' 8"  (1.727 m)   Wt 230 lb (104.3 kg)   BMI 34.97 kg/m   Opioid Risk Score:   Fall Risk Score:  `1  Depression screen PHQ 2/9  Depression screen [facility] 2/9 01/26/2019 07/20/2018 07/24/2017 01/22/2017 05/22/2016 03/20/2016 12/20/2015  Decreased Interest 1 1 2 1  0 0 1  Down, Depressed, Hopeless 1 1 2 1  0 0 1  PHQ - 2 Score 2 2 4 2  0 0 2  Altered sleeping - - - - - - -  Tired, decreased energy - - - - - - -  Change in appetite - - - - - - -  Feeling bad or failure about yourself  - - - - - - -  Trouble concentrating - - - - - - -  Moving slowly or fidgety/restless - - - - - - -  Suicidal thoughts - - - - - - -  PHQ-9 Score - - - - - - -    Review of Systems  Constitutional: Negative.   HENT: Positive for nosebleeds, sinus pain and sneezing.   Eyes: Negative.   Respiratory: Negative.   Cardiovascular: Negative.   Gastrointestinal: Positive for constipation, diarrhea and nausea.  Endocrine: Negative.   Genitourinary: Negative.   Musculoskeletal: Positive for back pain and myalgias.       Feet pain  Leg pain   Skin: Negative.   Allergic/Immunologic: Negative.   Neurological: Positive for weakness and numbness.       Tingling  Hematological: Negative.   Psychiatric/Behavioral: Positive for dysphoric mood. The patient is nervous/anxious.   All other systems reviewed and are negative.      Objective:   Physical Exam Vitals signs and nursing note reviewed.  Musculoskeletal:     Comments: No Physical Exam Performed: Virtual Visit  Neurological:     Mental Status: She is oriented to person, place, and time.           Assessment & Plan:  1. Cauda equina syndrome: Incontinence: Wearing Depends:Continue to Monitor.01/26/2019 2. Insomnia: Continue with Nortriptyline.01/26/2019 3. DDD L4-S1 s/p diskectomy with decompression of thecal sac: She continues to have back pain.Continue current medication regimen.  01/26/2019 Refilled: Percocet 10/325 mg #120--use 1 tablet every 6 hours as neededand MS Contin 30 mg one tablet every 12 hours as needed #60. Second script sent for the following month. We will continue the opioid monitoring program, this consists of regular clinic visits, examinations, urine drug screen, pill counts as well as use of [place] Controlled Substance Reporting System.. 4. Depression: Continue withCymbalta. Continue to Monitor.01/26/2019 5. Anxiety: Continue withcurrent medication regimen withXanax. Continue to Monitor.01/26/2019 6.Neuropathy:Continue:with current medication nregimen with Pamelor. 01/26/2019 7. Constipation: Continue Senokot.01/26/2019 8.Muscle Spasm: Continue Baclofen.01/26/2019 9. Lumbar Radiculitis: Continue Pamelor.01/26/2019  Webex Location of patient: In her Home Location of provider: Office Established patient Time spent on call: 15 Minutes

## 2019-03-03 ENCOUNTER — Other Ambulatory Visit: Payer: Self-pay | Admitting: Registered Nurse

## 2019-03-24 ENCOUNTER — Encounter: Payer: Medicare Other | Attending: Physical Medicine and Rehabilitation | Admitting: Physical Medicine & Rehabilitation

## 2019-03-24 ENCOUNTER — Encounter: Payer: Self-pay | Admitting: Physical Medicine & Rehabilitation

## 2019-03-24 ENCOUNTER — Other Ambulatory Visit: Payer: Self-pay

## 2019-03-24 DIAGNOSIS — Z5181 Encounter for therapeutic drug level monitoring: Secondary | ICD-10-CM | POA: Insufficient documentation

## 2019-03-24 DIAGNOSIS — Z79899 Other long term (current) drug therapy: Secondary | ICD-10-CM | POA: Insufficient documentation

## 2019-03-24 DIAGNOSIS — G834 Cauda equina syndrome: Secondary | ICD-10-CM | POA: Insufficient documentation

## 2019-03-24 DIAGNOSIS — G039 Meningitis, unspecified: Secondary | ICD-10-CM | POA: Diagnosis not present

## 2019-03-24 DIAGNOSIS — M5136 Other intervertebral disc degeneration, lumbar region: Secondary | ICD-10-CM | POA: Insufficient documentation

## 2019-03-24 MED ORDER — MORPHINE SULFATE ER 30 MG PO TBCR: 30.0000 mg | EXTENDED_RELEASE_TABLET | Freq: Two times a day (BID) | ORAL | 0 refills | Status: DC

## 2019-03-24 MED ORDER — OXYCODONE-ACETAMINOPHEN 10-325 MG PO TABS: 1.0000 | ORAL_TABLET | Freq: Four times a day (QID) | ORAL | 0 refills | Status: DC | PRN

## 2019-03-24 NOTE — Progress Notes (Signed)
Subjective:    Patient ID: Sherry Wong, female    DOB: [birth year], 63 y.o.   MRN: [id number]  HPI   [name] is here in follow up of her cauda equina syndrome and arachnoiditis. She has tried to stay active with COVID. She walks her dog daily. Her pain levels are fairly stable although her feet may be more sensitive and tender in the evenings and when she tries to go to [place]. . She hasn't fallen although she has lost her balance at home on occasion.   She remains on ms contin 30mg  q12, percocet 10/325 prn. She is also on pamelor and cymbalta for neuropathic pain and depression.       Pain Inventory Average Pain 6 Pain Right Now 6 My pain is constant, sharp, burning, dull, stabbing, tingling and aching  In the last 24 hours, has pain interfered with the following? General activity 9 Relation with others 9 Enjoyment of life 9 What TIME of day is your pain at its worst? all Sleep (in general) Fair  Pain is worse with: walking, bending, sitting, standing and some activites Pain improves with: rest, heat/ice, pacing activities and medication Relief from Meds: na  Mobility walk with assistance use a cane how many minutes can you walk? 15-20 ability to climb steps?  yes do you drive?  yes  Function disabled: date disabled 08/25/13 I need assistance with the following:  meal prep, household duties and shopping  Neuro/Psych bladder control problems bowel control problems weakness numbness tingling trouble walking spasms depression anxiety  Prior Studies Any changes since last visit?  no  Physicians involved in your care Any changes since last visit?  no   Family History  Problem Relation Age of Onset  . Anesthesia problems Mother   . Anesthesia problems Sister   . Hypotension Neg Hx   . Malignant hyperthermia Neg Hx   . Pseudochol deficiency Neg Hx    Social History   Socioeconomic History  . Marital status: Married    Spouse name: Not on file  .  Number of children: 0  . Years of education: 87  . Highest education level: Not on file  Occupational History  . Occupation: Disabled  Social Needs  . Financial resource strain: Not on file  . Food insecurity    Worry: Not on file    Inability: Not on file  . Transportation needs    Medical: Not on file    Non-medical: Not on file  Tobacco Use  . Smoking status: Never Smoker  . Smokeless tobacco: Never Used  Substance and Sexual Activity  . Alcohol use: Yes    Alcohol/week: 0.0 standard drinks    Comment: occ- 0.5 can beer every 2 months  . Drug use: No  . Sexual activity: Not Currently  Lifestyle  . Physical activity    Days per week: Not on file    Minutes per session: Not on file  . Stress: Not on file  Relationships  . Social Herbalist on phone: Not on file    Gets together: Not on file    Attends religious service: Not on file    Active member of club or organization: Not on file    Attends meetings of clubs or organizations: Not on file    Relationship status: Not on file  Other Topics Concern  . Not on file  Social History Narrative   Lives at home with husband.   Caffeine use:  1 cup coffee 3 times per week.    Past Surgical History:  Procedure Laterality Date  . BREAST BIOPSY     right  . BREAST SURGERY     left breast lumpectomy  . CHOLECYSTECTOMY    . COLONOSCOPY    . cysts removed from ovary    . DILATION AND CURETTAGE OF UTERUS    . ESOPHAGOGASTRODUODENOSCOPY    . HEMATOMA EVACUATION N/A 08/25/2013   Procedure: EVACUATION OF LUMBAR HEMATOMA;  Surgeon: Floyce Stakes, MD;  Location: [facility];  Service: Neurosurgery;  Laterality: N/A;  . LUMBAR LAMINECTOMY/DECOMPRESSION MICRODISCECTOMY  12/03/2011   Procedure: LUMBAR LAMINECTOMY/DECOMPRESSION MICRODISCECTOMY 2 LEVELS;  Surgeon: Floyce Stakes, MD;  Location: [place] [facility];  Service: Neurosurgery;  Laterality: Right;  Right Lumbar four-five Lumbar five sacral one Foraminotomies  .  LUMBAR WOUND DEBRIDEMENT N/A 09/01/2013   Procedure: LUMBAR WOUND DEBRIDEMENT;  Surgeon: Floyce Stakes, MD;  Location: [facility];  Service: Neurosurgery;  Laterality: N/A;  . PLACEMENT OF LUMBAR DRAIN N/A 09/01/2013   Procedure: PLACEMENT OF LUMBAR DRAIN;  Surgeon: Floyce Stakes, MD;  Location: [facility];  Service: Neurosurgery;  Laterality: N/A;  . TRANSFORAMINAL LUMBAR INTERBODY FUSION (TLIF) WITH PEDICLE SCREW FIXATION 2 LEVEL N/A 08/25/2013   Procedure:  Lumbar four-five, Lumbar five-Sacral one Transforaminal Lumbar Interbody Fusion ;  Surgeon: Floyce Stakes, MD;  Location: [place] [facility];  Service: Neurosurgery;  Laterality: N/A;   Past Medical History:  Diagnosis Date  . Anxiety    takes celexa daily  . Arthritis   . Asthma   . Bronchitis   . Chronic back pain    ddd/lumbago,and radiculopathy  . Depression   . Gastric ulcer   . GERD (gastroesophageal reflux disease)    takes Protonix daily  . Gout    hx of  . H/O hiatal hernia   . Hx of colonic polyps   . Hypertension    takes Losartan daily  . Hypothyroidism    takes Levothyroxine daily  . IBS (irritable bowel syndrome)   . Nocturia   . Pneumonia    hx of 15+yrs ago  . PONV (postoperative nausea and vomiting)   . Ulcerative colitis   . Urinary incontinence    BP 128/81   Pulse 92   Temp 97.9 F (36.6 C)   Ht 5\' 8"  (1.727 m)   Wt 234 lb (106.1 kg)   SpO2 95%   BMI 35.58 kg/m   Opioid Risk Score:   Fall Risk Score:  `1  Depression screen PHQ 2/9  Depression screen [facility] 2/9 03/24/2019 01/26/2019 07/20/2018 07/24/2017 01/22/2017 05/22/2016 03/20/2016  Decreased Interest 1 1 1 2 1  0 0  Down, Depressed, Hopeless 1 1 1 2 1  0 0  PHQ - 2 Score 2 2 2 4 2  0 0  Altered sleeping - - - - - - -  Tired, decreased energy - - - - - - -  Change in appetite - - - - - - -  Feeling bad or failure about yourself  - - - - - - -  Trouble concentrating - - - - - - -  Moving slowly or fidgety/restless - - - - - - -   Suicidal thoughts - - - - - - -  PHQ-9 Score - - - - - - -    Review of Systems  Constitutional: Negative.   HENT: Negative.   Eyes: Negative.   Respiratory: Positive for apnea,  shortness of breath and wheezing.   Cardiovascular: Negative.   Gastrointestinal: Positive for abdominal pain, constipation, nausea and vomiting.  Endocrine: Negative.   Genitourinary: Negative.   Musculoskeletal: Positive for gait problem.       Spasms  Skin: Negative.   Allergic/Immunologic: Negative.   Neurological: Positive for weakness and numbness.  Hematological: Negative.   Psychiatric/Behavioral: Positive for dysphoric mood. The patient is nervous/anxious.   All other systems reviewed and are negative.      Objective:   Physical Exam General: No acute distress HEENT: EOMI, oral membranes moist Cards: reg rate  Chest: normal effort Abdomen: Soft, NT, ND Skin: dry, intact Extremities: no edema  Musculoskeletal: LB TTP Lower Extremities: wide based gait. Strength 4 to 5/5 bilaterally.Sensation remainsdiminished below the waist bilaterally to light touch and pain--no change.DTR's remain1+.normal tone. Neurological: She is alert and oriented to person, place, and time.  Skin: Skin is warm and dry.  Psychiatric:mood more up beat. pleasant.    Assessment & Plan:  1. Cauda equina syndrome with arachnoiditis: pt with substantial pain and ongoing sensory loss. -have revieweda spinal stimulator which is an option if she should so choose -?caudal blockcould still an option. -HEP ongoing      -discussed topical treatments including lidocaine and diclofenac.can get these now otc 2. Insomnia: Continue with Trazodone and Xanax  3. DDD L4-S1 s/p diskectomy with decompression of thecal sac:  -continuepercocet 10/325 mg #100--1 q6 prn.  -continue ms contin to 30mg  q12 #60 -Medication was refilled and a second prescription was sent to the patient's  pharmacy for next month.  . We will continue the opioid monitoring program, this consists of regular clinic visits, examinations, routine drug screening, pill counts as well as use of [place] Controlled Substance Reporting System. [place] today.   4. Depression/anxiety: cymbalta,xanax          5. Neuropathic pain: now on cymbalta 73m daily -continuenortriptyline- 50mg  qhs-  -consider trial of another anticonvulsant -stimulator as above   Fifteen minutes of face to face patient care time were spent during this visit. All questions were encouraged and answered.  Follow up with NP in 2 months .

## 2019-03-24 NOTE — Patient Instructions (Signed)
TRY OTC LIDOCAINE PATCHES/GEL AND/OR VOLTAREN GEL FOR YOUR LEGS/ FEET.

## 2019-03-30 DIAGNOSIS — I10 Essential (primary) hypertension: Secondary | ICD-10-CM | POA: Diagnosis not present

## 2019-03-30 DIAGNOSIS — E7800 Pure hypercholesterolemia, unspecified: Secondary | ICD-10-CM | POA: Diagnosis not present

## 2019-04-09 DIAGNOSIS — E039 Hypothyroidism, unspecified: Secondary | ICD-10-CM | POA: Diagnosis not present

## 2019-04-09 DIAGNOSIS — I10 Essential (primary) hypertension: Secondary | ICD-10-CM | POA: Diagnosis not present

## 2019-04-09 DIAGNOSIS — E876 Hypokalemia: Secondary | ICD-10-CM | POA: Diagnosis not present

## 2019-04-09 DIAGNOSIS — E782 Mixed hyperlipidemia: Secondary | ICD-10-CM | POA: Diagnosis not present

## 2019-04-13 DIAGNOSIS — R609 Edema, unspecified: Secondary | ICD-10-CM | POA: Diagnosis not present

## 2019-04-13 DIAGNOSIS — E782 Mixed hyperlipidemia: Secondary | ICD-10-CM | POA: Diagnosis not present

## 2019-04-13 DIAGNOSIS — E039 Hypothyroidism, unspecified: Secondary | ICD-10-CM | POA: Diagnosis not present

## 2019-04-13 DIAGNOSIS — Z6821 Body mass index (BMI) 21.0-21.9, adult: Secondary | ICD-10-CM | POA: Diagnosis not present

## 2019-04-13 DIAGNOSIS — K219 Gastro-esophageal reflux disease without esophagitis: Secondary | ICD-10-CM | POA: Diagnosis not present

## 2019-04-22 ENCOUNTER — Other Ambulatory Visit: Payer: Self-pay | Admitting: *Deleted

## 2019-04-22 MED ORDER — ALPRAZOLAM 0.25 MG PO TABS: ORAL_TABLET | ORAL | 2 refills | Status: DC

## 2019-04-30 DIAGNOSIS — I10 Essential (primary) hypertension: Secondary | ICD-10-CM | POA: Diagnosis not present

## 2019-04-30 DIAGNOSIS — E782 Mixed hyperlipidemia: Secondary | ICD-10-CM | POA: Diagnosis not present

## 2019-05-24 ENCOUNTER — Encounter: Payer: Medicare Other | Attending: Physical Medicine and Rehabilitation | Admitting: Registered Nurse

## 2019-05-24 ENCOUNTER — Other Ambulatory Visit: Payer: Self-pay

## 2019-05-24 VITALS — BP 106/73 | HR 76 | Temp 98.6°F | Ht 67.0 in | Wt 233.0 lb

## 2019-05-24 DIAGNOSIS — Z5181 Encounter for therapeutic drug level monitoring: Secondary | ICD-10-CM | POA: Diagnosis not present

## 2019-05-24 DIAGNOSIS — F3289 Other specified depressive episodes: Secondary | ICD-10-CM

## 2019-05-24 DIAGNOSIS — Z79899 Other long term (current) drug therapy: Secondary | ICD-10-CM | POA: Insufficient documentation

## 2019-05-24 DIAGNOSIS — M62838 Other muscle spasm: Secondary | ICD-10-CM

## 2019-05-24 DIAGNOSIS — M51369 Other intervertebral disc degeneration, lumbar region without mention of lumbar back pain or lower extremity pain: Secondary | ICD-10-CM

## 2019-05-24 DIAGNOSIS — M5416 Radiculopathy, lumbar region: Secondary | ICD-10-CM

## 2019-05-24 DIAGNOSIS — G834 Cauda equina syndrome: Secondary | ICD-10-CM

## 2019-05-24 DIAGNOSIS — G039 Meningitis, unspecified: Secondary | ICD-10-CM | POA: Diagnosis not present

## 2019-05-24 DIAGNOSIS — M961 Postlaminectomy syndrome, not elsewhere classified: Secondary | ICD-10-CM

## 2019-05-24 DIAGNOSIS — G894 Chronic pain syndrome: Secondary | ICD-10-CM

## 2019-05-24 DIAGNOSIS — F411 Generalized anxiety disorder: Secondary | ICD-10-CM

## 2019-05-24 DIAGNOSIS — Z79891 Long term (current) use of opiate analgesic: Secondary | ICD-10-CM | POA: Diagnosis not present

## 2019-05-24 DIAGNOSIS — M5136 Other intervertebral disc degeneration, lumbar region: Secondary | ICD-10-CM

## 2019-05-24 MED ORDER — MORPHINE SULFATE ER 30 MG PO TBCR: 30.0000 mg | EXTENDED_RELEASE_TABLET | Freq: Two times a day (BID) | ORAL | 0 refills | Status: DC

## 2019-05-24 MED ORDER — BACLOFEN 20 MG PO TABS: 20.0000 mg | ORAL_TABLET | Freq: Three times a day (TID) | ORAL | 2 refills | Status: DC

## 2019-05-24 MED ORDER — OXYCODONE-ACETAMINOPHEN 10-325 MG PO TABS: 1.0000 | ORAL_TABLET | Freq: Four times a day (QID) | ORAL | 0 refills | Status: DC | PRN

## 2019-05-24 NOTE — Progress Notes (Signed)
Subjective:    Patient ID: Sherry Wong, female    DOB: 01-Oct-1955, 63 y.o.   MRN: [id number]  HPI: Sherry Wong is a 63 y.o. female who returns for follow up appointment for chronic pain and medication refill. She states her pain is located in her lower back radiating into her bilateral lower extremities abd bilateral feet. She rates her pain 5. Her current exercise regime is walking and performing stretching exercises.  Sherry Wong Morphine equivalent is 120.00  MME. She is also prescribed Alprazolam, we have discussed the black box warning of using opioids and benzodiazepines. I highlighted the dangers of using these drugs together and discussed the adverse events including respiratory suppression, overdose, cognitive impairment and importance of compliance with current regimen. We will continue to monitor and adjust as indicated.    Last UDS was Performed on 07/20/2018, it was consistent. UDS ordered today.   Pain Inventory Average Pain 6 Pain Right Now 5 My pain is sharp, burning, stabbing, tingling and aching  In the last 24 hours, has pain interfered with the following? General activity 9 Relation with others 9 Enjoyment of life 9 What TIME of day is your pain at its worst? all Sleep (in general) Fair  Pain is worse with: walking, bending, sitting, standing and some activites Pain improves with: rest, heat/ice and medication Relief from Meds: 5  Mobility use a cane use a walker how many minutes can you walk? 15-20 ability to climb steps?  yes do you drive?  yes  Function disabled: date disabled 04/30/13 I need assistance with the following:  meal prep, household duties and shopping  Neuro/Psych bladder control problems bowel control problems weakness numbness tingling trouble walking spasms depression anxiety  Prior Studies x-rays CT/MRI  Physicians involved in your care Primary care [facility] Neurologist Dr. Jaynee Eagles Neurosurgeon Dr. Louanne Skye   Family History  Problem Relation Age of Onset  . Anesthesia problems Mother   . Anesthesia problems Sister   . Hypotension Neg Hx   . Malignant hyperthermia Neg Hx   . Pseudochol deficiency Neg Hx    Social History   Socioeconomic History  . Marital status: Married    Spouse name: Not on file  . Number of children: 0  . Years of education: 96  . Highest education level: Not on file  Occupational History  . Occupation: Disabled  Social Needs  . Financial resource strain: Not on file  . Food insecurity    Worry: Not on file    Inability: Not on file  . Transportation needs    Medical: Not on file    Non-medical: Not on file  Tobacco Use  . Smoking status: Never Smoker  . Smokeless tobacco: Never Used  Substance and Sexual Activity  . Alcohol use: Yes    Alcohol/week: 0.0 standard drinks    Comment: occ- 0.5 can beer every 2 months  . Drug use: No  . Sexual activity: Not Currently  Lifestyle  . Physical activity    Days per week: Not on file    Minutes per session: Not on file  . Stress: Not on file  Relationships  . Social Herbalist on phone: Not on file    Gets together: Not on file    Attends religious service: Not on file    Active member of club or organization: Not on file    Attends meetings of clubs or organizations: Not on file    Relationship status:  Not on file  Other Topics Concern  . Not on file  Social History Narrative   Lives at home with husband.   Caffeine use: 1 cup coffee 3 times per week.    Past Surgical History:  Procedure Laterality Date  . BREAST BIOPSY     right  . BREAST SURGERY     left breast lumpectomy  . CHOLECYSTECTOMY    . COLONOSCOPY    . cysts removed from ovary    . DILATION AND CURETTAGE OF UTERUS    . ESOPHAGOGASTRODUODENOSCOPY    . HEMATOMA EVACUATION N/A 08/25/2013   Procedure: EVACUATION OF LUMBAR HEMATOMA;  Surgeon: Floyce Stakes, MD;  Location: [facility];  Service: Neurosurgery;  Laterality:  N/A;  . LUMBAR LAMINECTOMY/DECOMPRESSION MICRODISCECTOMY  12/03/2011   Procedure: LUMBAR LAMINECTOMY/DECOMPRESSION MICRODISCECTOMY 2 LEVELS;  Surgeon: Floyce Stakes, MD;  Location: [place] [facility];  Service: Neurosurgery;  Laterality: Right;  Right Lumbar four-five Lumbar five sacral one Foraminotomies  . LUMBAR WOUND DEBRIDEMENT N/A 09/01/2013   Procedure: LUMBAR WOUND DEBRIDEMENT;  Surgeon: Floyce Stakes, MD;  Location: [facility];  Service: Neurosurgery;  Laterality: N/A;  . PLACEMENT OF LUMBAR DRAIN N/A 09/01/2013   Procedure: PLACEMENT OF LUMBAR DRAIN;  Surgeon: Floyce Stakes, MD;  Location: [facility];  Service: Neurosurgery;  Laterality: N/A;  . TRANSFORAMINAL LUMBAR INTERBODY FUSION (TLIF) WITH PEDICLE SCREW FIXATION 2 LEVEL N/A 08/25/2013   Procedure:  Lumbar four-five, Lumbar five-Sacral one Transforaminal Lumbar Interbody Fusion ;  Surgeon: Floyce Stakes, MD;  Location: [place] [facility];  Service: Neurosurgery;  Laterality: N/A;   Past Medical History:  Diagnosis Date  . Anxiety    takes celexa daily  . Arthritis   . Asthma   . Bronchitis   . Chronic back pain    ddd/lumbago,and radiculopathy  . Depression   . Gastric ulcer   . GERD (gastroesophageal reflux disease)    takes Protonix daily  . Gout    hx of  . H/O hiatal hernia   . Hx of colonic polyps   . Hypertension    takes Losartan daily  . Hypothyroidism    takes Levothyroxine daily  . IBS (irritable bowel syndrome)   . Nocturia   . Pneumonia    hx of 15+yrs ago  . PONV (postoperative nausea and vomiting)   . Ulcerative colitis   . Urinary incontinence    BP 106/73   Pulse 76   Temp 98.6 F (37 C)   Ht 5\' 7"  (1.702 m)   Wt 233 lb (105.7 kg)   SpO2 93%   BMI 36.49 kg/m   Opioid Risk Score:   Fall Risk Score:  `1  Depression screen PHQ 2/9  Depression screen [facility] 2/9 03/24/2019 01/26/2019 07/20/2018 07/24/2017 01/22/2017 05/22/2016 03/20/2016  Decreased Interest 1 1 1 2 1  0 0  Down, Depressed,  Hopeless 1 1 1 2 1  0 0  PHQ - 2 Score 2 2 2 4 2  0 0  Altered sleeping - - - - - - -  Tired, decreased energy - - - - - - -  Change in appetite - - - - - - -  Feeling bad or failure about yourself  - - - - - - -  Trouble concentrating - - - - - - -  Moving slowly or fidgety/restless - - - - - - -  Suicidal thoughts - - - - - - -  PHQ-9 Score - - - - - - -  Review of Systems  Constitutional: Negative.   HENT: Negative.   Eyes: Negative.   Respiratory: Positive for cough.   Gastrointestinal: Positive for abdominal pain, diarrhea and nausea.  Endocrine: Negative.   Genitourinary: Negative.   Musculoskeletal: Negative.   Skin: Negative.   Allergic/Immunologic: Negative.   Neurological: Negative.   Hematological: Negative.   Psychiatric/Behavioral: Negative.   All other systems reviewed and are negative.      Objective:   Physical Exam Vitals signs and nursing note reviewed.  Constitutional:      Appearance: Normal appearance.  Neck:     Musculoskeletal: Normal range of motion and neck supple.  Cardiovascular:     Rate and Rhythm: Normal rate and regular rhythm.     Pulses: Normal pulses.     Heart sounds: Normal heart sounds.  Pulmonary:     Effort: Pulmonary effort is normal.     Breath sounds: Normal breath sounds.  Musculoskeletal:     Comments: Normal Muscle Bulk and Muscle Testing Reveals:  Upper Extremities: Full ROM and Muscle Strength 5/5 Lumbar Paraspinal Tenderness: L-3-L-5 Lower Extremities: Full ROM and Muscle Strength 5/5 Arises from Table slowly using cane for support Narrow Based Gait   Skin:    General: Skin is warm and dry.  Neurological:     Mental Status: She is alert and oriented to person, place, and time.  Psychiatric:        Mood and Affect: Mood normal.        Behavior: Behavior normal.           Assessment & Plan:  1. Cauda equina syndrome: Incontinence: Wearing Depends:Continue to Monitor.05/24/2019 2. Insomnia: Continue with  Nortriptyline.05/24/2019 3. DDD L4-S1 s/p diskectomy with decompression of thecal sac: She continues to have back pain.Continue current medication regimen. 05/24/2019 Refilled: Percocet 10/325 mg #120--use 1 tablet every 6 hours as neededand MS Contin 30 mg one tablet every 12 hours as needed #60. Second script sent for the following month. We will continue the opioid monitoring program, this consists of regular clinic visits, examinations, urine drug screen, pill counts as well as use of [place] Controlled Substance Reporting System.. 4. Depression: Continue withCymbalta. Continue to Monitor.05/24/2019 5. Anxiety: Continue withcurrent medication regimen withXanax. Continue to Monitor.05/24/2019 6.Neuropathy:Continue:with current medication nregimen with Pamelor. 05/24/2019 7. Constipation: Continue Senokot.05/24/2019 8.Muscle Spasm: Continue Baclofen.05/24/2019 9. Lumbar Radiculitis: Continue Pamelor.05/24/2019  3minutes of face to face patient care time was spent during this visit. All questions were encouraged and answered.  F/U in 2 months

## 2019-05-26 LAB — TOXASSURE SELECT,+ANTIDEPR,UR

## 2019-05-27 ENCOUNTER — Telehealth: Payer: Self-pay | Admitting: *Deleted

## 2019-05-27 NOTE — Telephone Encounter (Signed)
Urine drug screen for this encounter is consistent for prescribed medication 

## 2019-05-30 ENCOUNTER — Encounter: Payer: Self-pay | Admitting: Registered Nurse

## 2019-06-30 DIAGNOSIS — I10 Essential (primary) hypertension: Secondary | ICD-10-CM | POA: Diagnosis not present

## 2019-06-30 DIAGNOSIS — R5383 Other fatigue: Secondary | ICD-10-CM | POA: Diagnosis not present

## 2019-06-30 DIAGNOSIS — E782 Mixed hyperlipidemia: Secondary | ICD-10-CM | POA: Diagnosis not present

## 2019-06-30 DIAGNOSIS — E039 Hypothyroidism, unspecified: Secondary | ICD-10-CM | POA: Diagnosis not present

## 2019-06-30 DIAGNOSIS — K219 Gastro-esophageal reflux disease without esophagitis: Secondary | ICD-10-CM | POA: Diagnosis not present

## 2019-07-02 ENCOUNTER — Other Ambulatory Visit: Payer: Self-pay | Admitting: Registered Nurse

## 2019-07-02 DIAGNOSIS — G834 Cauda equina syndrome: Secondary | ICD-10-CM

## 2019-07-02 DIAGNOSIS — M5136 Other intervertebral disc degeneration, lumbar region: Secondary | ICD-10-CM

## 2019-07-05 DIAGNOSIS — E782 Mixed hyperlipidemia: Secondary | ICD-10-CM | POA: Diagnosis not present

## 2019-07-05 DIAGNOSIS — E039 Hypothyroidism, unspecified: Secondary | ICD-10-CM | POA: Diagnosis not present

## 2019-07-05 DIAGNOSIS — Z23 Encounter for immunization: Secondary | ICD-10-CM | POA: Diagnosis not present

## 2019-07-05 DIAGNOSIS — R609 Edema, unspecified: Secondary | ICD-10-CM | POA: Diagnosis not present

## 2019-07-05 DIAGNOSIS — K219 Gastro-esophageal reflux disease without esophagitis: Secondary | ICD-10-CM | POA: Diagnosis not present

## 2019-07-08 ENCOUNTER — Telehealth: Payer: Self-pay | Admitting: Registered Nurse

## 2019-07-08 MED ORDER — NORTRIPTYLINE HCL 50 MG PO CAPS: 50.0000 mg | ORAL_CAPSULE | Freq: Every day | ORAL | 3 refills | Status: DC

## 2019-07-08 NOTE — Telephone Encounter (Signed)
Nortriptyline refilled today.

## 2019-07-26 ENCOUNTER — Encounter: Payer: Medicare Other | Attending: Physical Medicine and Rehabilitation | Admitting: Registered Nurse

## 2019-07-26 ENCOUNTER — Other Ambulatory Visit: Payer: Self-pay

## 2019-07-26 VITALS — BP 112/77 | HR 75 | Temp 97.7°F | Ht 67.0 in | Wt 222.4 lb

## 2019-07-26 DIAGNOSIS — G894 Chronic pain syndrome: Secondary | ICD-10-CM

## 2019-07-26 DIAGNOSIS — Z5181 Encounter for therapeutic drug level monitoring: Secondary | ICD-10-CM | POA: Diagnosis not present

## 2019-07-26 DIAGNOSIS — M7062 Trochanteric bursitis, left hip: Secondary | ICD-10-CM

## 2019-07-26 DIAGNOSIS — M5136 Other intervertebral disc degeneration, lumbar region: Secondary | ICD-10-CM

## 2019-07-26 DIAGNOSIS — G834 Cauda equina syndrome: Secondary | ICD-10-CM | POA: Diagnosis not present

## 2019-07-26 DIAGNOSIS — M961 Postlaminectomy syndrome, not elsewhere classified: Secondary | ICD-10-CM

## 2019-07-26 DIAGNOSIS — F411 Generalized anxiety disorder: Secondary | ICD-10-CM

## 2019-07-26 DIAGNOSIS — M5416 Radiculopathy, lumbar region: Secondary | ICD-10-CM | POA: Diagnosis not present

## 2019-07-26 DIAGNOSIS — G039 Meningitis, unspecified: Secondary | ICD-10-CM | POA: Diagnosis not present

## 2019-07-26 DIAGNOSIS — F3289 Other specified depressive episodes: Secondary | ICD-10-CM

## 2019-07-26 DIAGNOSIS — Z79899 Other long term (current) drug therapy: Secondary | ICD-10-CM | POA: Insufficient documentation

## 2019-07-26 DIAGNOSIS — M7061 Trochanteric bursitis, right hip: Secondary | ICD-10-CM

## 2019-07-26 DIAGNOSIS — M62838 Other muscle spasm: Secondary | ICD-10-CM

## 2019-07-26 DIAGNOSIS — Z9181 History of falling: Secondary | ICD-10-CM

## 2019-07-26 DIAGNOSIS — Z79891 Long term (current) use of opiate analgesic: Secondary | ICD-10-CM

## 2019-07-26 MED ORDER — OXYCODONE-ACETAMINOPHEN 10-325 MG PO TABS: 1.0000 | ORAL_TABLET | Freq: Four times a day (QID) | ORAL | 0 refills | Status: DC | PRN

## 2019-07-26 MED ORDER — ALPRAZOLAM 0.25 MG PO TABS: ORAL_TABLET | ORAL | 2 refills | Status: DC

## 2019-07-26 MED ORDER — MORPHINE SULFATE ER 30 MG PO TBCR: 30.0000 mg | EXTENDED_RELEASE_TABLET | Freq: Two times a day (BID) | ORAL | 0 refills | Status: DC

## 2019-07-26 NOTE — Progress Notes (Signed)
Subjective:    Patient ID: Sherry Wong, female    DOB: 02/15/1956, 63 y.o.   MRN: [id number]  HPI: Sherry Wong is a 63 y.o. female who returns for follow up appointment for chronic pain and medication refill. She states her pain is located in her lower back pain radiating into her bilateral lower extremities, bilateral hips and bilateral feet. She rates her pain 5. Her current exercise regime is walking and performing stretching exercises.  [name] reports last week she lost her balanced and landed on her left side, her girlfriend helped her up. Educated on falls prevention, she verbalizes understanding.   [name] Morphine equivalent is 120.00  MME.  She  is also prescribed Alprazolam..We have discussed the black box warning of using opioids and benzodiazepines. I highlighted the dangers of using these drugs together and discussed the adverse events including respiratory suppression, overdose, cognitive impairment and importance of compliance with current regimen. We will continue to monitor and adjust as indicated.   Last UDS was Performed on 05/24/2019, it was consistent.   Pain Inventory Average Pain 7 Pain Right Now 5 My pain is sharp, stabbing, tingling and aching  In the last 24 hours, has pain interfered with the following? General activity 9 Relation with others 9 Enjoyment of life 9 What TIME of day is your pain at its worst? all Sleep (in general) Fair  Pain is worse with: walking, bending, standing and some activites Pain improves with: rest, heat/ice and medication Relief from Meds: 5  Mobility walk with assistance use a cane how many minutes can you walk? 10-15 ability to climb steps?  yes do you drive?  yes  Function disabled: date disabled 04-30-13 I need assistance with the following:  bathing, meal prep, household duties and shopping  Neuro/Psych weakness numbness tingling trouble walking spasms depression anxiety  Prior Studies bone  scan x-rays CT/MRI  Physicians involved in your care Primary care [place]   Family History  Problem Relation Age of Onset  . Anesthesia problems Mother   . Anesthesia problems Sister   . Hypotension Neg Hx   . Malignant hyperthermia Neg Hx   . Pseudochol deficiency Neg Hx    Social History   Socioeconomic History  . Marital status: Married    Spouse name: Not on file  . Number of children: 0  . Years of education: 22  . Highest education level: Not on file  Occupational History  . Occupation: Disabled  Social Needs  . Financial resource strain: Not on file  . Food insecurity    Worry: Not on file    Inability: Not on file  . Transportation needs    Medical: Not on file    Non-medical: Not on file  Tobacco Use  . Smoking status: Never Smoker  . Smokeless tobacco: Never Used  Substance and Sexual Activity  . Alcohol use: Yes    Alcohol/week: 0.0 standard drinks    Comment: occ- 0.5 can beer every 2 months  . Drug use: No  . Sexual activity: Not Currently  Lifestyle  . Physical activity    Days per week: Not on file    Minutes per session: Not on file  . Stress: Not on file  Relationships  . Social Herbalist on phone: Not on file    Gets together: Not on file    Attends religious service: Not on file    Active member of club  or organization: Not on file    Attends meetings of clubs or organizations: Not on file    Relationship status: Not on file  Other Topics Concern  . Not on file  Social History Narrative   Lives at home with husband.   Caffeine use: 1 cup coffee 3 times per week.    Past Surgical History:  Procedure Laterality Date  . BREAST BIOPSY     right  . BREAST SURGERY     left breast lumpectomy  . CHOLECYSTECTOMY    . COLONOSCOPY    . cysts removed from ovary    . DILATION AND CURETTAGE OF UTERUS    . ESOPHAGOGASTRODUODENOSCOPY    . HEMATOMA EVACUATION N/A 08/25/2013   Procedure:  EVACUATION OF LUMBAR HEMATOMA;  Surgeon: Floyce Stakes, MD;  Location: [facility];  Service: Neurosurgery;  Laterality: N/A;  . LUMBAR LAMINECTOMY/DECOMPRESSION MICRODISCECTOMY  12/03/2011   Procedure: LUMBAR LAMINECTOMY/DECOMPRESSION MICRODISCECTOMY 2 LEVELS;  Surgeon: Floyce Stakes, MD;  Location: [place] [facility];  Service: Neurosurgery;  Laterality: Right;  Right Lumbar four-five Lumbar five sacral one Foraminotomies  . LUMBAR WOUND DEBRIDEMENT N/A 09/01/2013   Procedure: LUMBAR WOUND DEBRIDEMENT;  Surgeon: Floyce Stakes, MD;  Location: [facility];  Service: Neurosurgery;  Laterality: N/A;  . PLACEMENT OF LUMBAR DRAIN N/A 09/01/2013   Procedure: PLACEMENT OF LUMBAR DRAIN;  Surgeon: Floyce Stakes, MD;  Location: [facility];  Service: Neurosurgery;  Laterality: N/A;  . TRANSFORAMINAL LUMBAR INTERBODY FUSION (TLIF) WITH PEDICLE SCREW FIXATION 2 LEVEL N/A 08/25/2013   Procedure:  Lumbar four-five, Lumbar five-Sacral one Transforaminal Lumbar Interbody Fusion ;  Surgeon: Floyce Stakes, MD;  Location: [place] [facility];  Service: Neurosurgery;  Laterality: N/A;   Past Medical History:  Diagnosis Date  . Anxiety    takes celexa daily  . Arthritis   . Asthma   . Bronchitis   . Chronic back pain    ddd/lumbago,and radiculopathy  . Depression   . Gastric ulcer   . GERD (gastroesophageal reflux disease)    takes Protonix daily  . Gout    hx of  . H/O hiatal hernia   . Hx of colonic polyps   . Hypertension    takes Losartan daily  . Hypothyroidism    takes Levothyroxine daily  . IBS (irritable bowel syndrome)   . Nocturia   . Pneumonia    hx of 15+yrs ago  . PONV (postoperative nausea and vomiting)   . Ulcerative colitis   . Urinary incontinence    BP 112/77   Pulse 75   Temp 97.7 F (36.5 C)   Ht 5\' 7"  (1.702 m)   Wt 222 lb 6.4 oz (100.9 kg)   SpO2 92%   BMI 34.83 kg/m   Opioid Risk Score:   Fall Risk Score:  `1  Depression screen PHQ 2/9  Depression screen [facility]  2/9 03/24/2019 01/26/2019 07/20/2018 07/24/2017 01/22/2017 05/22/2016 03/20/2016  Decreased Interest 1 1 1 2 1  0 0  Down, Depressed, Hopeless 1 1 1 2 1  0 0  PHQ - 2 Score 2 2 2 4 2  0 0  Altered sleeping - - - - - - -  Tired, decreased energy - - - - - - -  Change in appetite - - - - - - -  Feeling bad or failure about yourself  - - - - - - -  Trouble concentrating - - - - - - -  Moving slowly or  fidgety/restless - - - - - - -  Suicidal thoughts - - - - - - -  PHQ-9 Score - - - - - - -   Review of Systems  Gastrointestinal: Positive for abdominal pain, diarrhea, nausea and vomiting.  Musculoskeletal: Positive for gait problem.  Neurological: Positive for weakness and numbness.  Psychiatric/Behavioral: Positive for dysphoric mood. The patient is nervous/anxious.   All other systems reviewed and are negative.      Objective:   Physical Exam Vitals signs and nursing note reviewed.  Constitutional:      Appearance: Normal appearance.  Neck:     Musculoskeletal: Normal range of motion and neck supple.  Cardiovascular:     Rate and Rhythm: Normal rate and regular rhythm.     Pulses: Normal pulses.     Heart sounds: Normal heart sounds.  Pulmonary:     Effort: Pulmonary effort is normal.     Breath sounds: Normal breath sounds.  Musculoskeletal:     Comments: Normal Muscle Bulk and Muscle Testing Reveals:  Upper Extremities: Full ROM and Muscle Strength 5/5 Lumbar Hypersensitivity Bilateral Greater Trochanter Tenderness Lower Extremities: Full ROM and Muscle Strength 5/5 Arises from Table Slowly using cane for support Narrow Based Gait   Skin:    General: Skin is warm and dry.  Neurological:     Mental Status: She is alert and oriented to person, place, and time.  Psychiatric:        Mood and Affect: Mood normal.        Behavior: Behavior normal.           Assessment & Plan:  1. Cauda equina syndrome: Incontinence: Wearing Depends:Continue to Monitor.07/26/2019 2.  Insomnia: Continue with Nortriptyline.07/26/2019 3. DDD L4-S1 s/p diskectomy with decompression of thecal sac: She continues to have back pain.Continue current medication regimen. 07/26/2019 Refilled: Percocet 10/325 mg #120--use 1 tablet every 6 hours as neededand MS Contin 30 mg one tablet every 12 hours as needed #60. Second script sent for the following month. We will continue the opioid monitoring program, this consists of regular clinic visits, examinations, urine drug screen, pill counts as well as use of [place] Controlled Substance Reporting System.. 4. Depression: Continue withCymbalta. Continue to Monitor.07/26/2019 5. Anxiety: Continue withcurrent medication regimen withXanax. Continue to Monitor.07/26/2019 6.Neuropathy:Continue:with current medication nregimen with Pamelor. 07/26/2019 7. Constipation: Continue Senokot.07/26/2019 8.Muscle Spasm: Continue Baclofen.07/26/2019 9. Lumbar Radiculitis: Continue Pamelor.07/26/2019 10. Fall: Educated on Falls Prevention: She verbalizes understanding.   4minutes of face to face patient care time was spent during this visit. All questions were encouraged and answered.  F/U in 2 months

## 2019-08-01 ENCOUNTER — Encounter: Payer: Self-pay | Admitting: Registered Nurse

## 2019-08-16 DIAGNOSIS — E039 Hypothyroidism, unspecified: Secondary | ICD-10-CM | POA: Diagnosis not present

## 2019-09-02 ENCOUNTER — Other Ambulatory Visit: Payer: Self-pay | Admitting: Family Medicine

## 2019-09-20 ENCOUNTER — Encounter: Payer: Medicare Other | Attending: Physical Medicine and Rehabilitation | Admitting: Registered Nurse

## 2019-09-20 ENCOUNTER — Encounter: Payer: Self-pay | Admitting: Registered Nurse

## 2019-09-20 ENCOUNTER — Other Ambulatory Visit: Payer: Self-pay

## 2019-09-20 VITALS — BP 111/77 | HR 76 | Temp 97.5°F | Ht 66.5 in | Wt 224.0 lb

## 2019-09-20 DIAGNOSIS — Z5181 Encounter for therapeutic drug level monitoring: Secondary | ICD-10-CM | POA: Diagnosis not present

## 2019-09-20 DIAGNOSIS — G834 Cauda equina syndrome: Secondary | ICD-10-CM

## 2019-09-20 DIAGNOSIS — Z79891 Long term (current) use of opiate analgesic: Secondary | ICD-10-CM

## 2019-09-20 DIAGNOSIS — M5416 Radiculopathy, lumbar region: Secondary | ICD-10-CM

## 2019-09-20 DIAGNOSIS — G894 Chronic pain syndrome: Secondary | ICD-10-CM

## 2019-09-20 DIAGNOSIS — Z9181 History of falling: Secondary | ICD-10-CM

## 2019-09-20 DIAGNOSIS — Z79899 Other long term (current) drug therapy: Secondary | ICD-10-CM | POA: Insufficient documentation

## 2019-09-20 DIAGNOSIS — F411 Generalized anxiety disorder: Secondary | ICD-10-CM

## 2019-09-20 DIAGNOSIS — M62838 Other muscle spasm: Secondary | ICD-10-CM

## 2019-09-20 DIAGNOSIS — M7062 Trochanteric bursitis, left hip: Secondary | ICD-10-CM

## 2019-09-20 DIAGNOSIS — G039 Meningitis, unspecified: Secondary | ICD-10-CM | POA: Diagnosis not present

## 2019-09-20 DIAGNOSIS — M961 Postlaminectomy syndrome, not elsewhere classified: Secondary | ICD-10-CM

## 2019-09-20 DIAGNOSIS — F3289 Other specified depressive episodes: Secondary | ICD-10-CM

## 2019-09-20 DIAGNOSIS — M5136 Other intervertebral disc degeneration, lumbar region: Secondary | ICD-10-CM

## 2019-09-20 DIAGNOSIS — M7061 Trochanteric bursitis, right hip: Secondary | ICD-10-CM

## 2019-09-20 MED ORDER — OXYCODONE-ACETAMINOPHEN 10-325 MG PO TABS: 1.0000 | ORAL_TABLET | Freq: Four times a day (QID) | ORAL | 0 refills | Status: DC | PRN

## 2019-09-20 MED ORDER — MORPHINE SULFATE ER 30 MG PO TBCR: 30.0000 mg | EXTENDED_RELEASE_TABLET | Freq: Two times a day (BID) | ORAL | 0 refills | Status: DC

## 2019-09-20 MED ORDER — NORTRIPTYLINE HCL 50 MG PO CAPS: 50.0000 mg | ORAL_CAPSULE | Freq: Every day | ORAL | 3 refills | Status: DC

## 2019-09-20 MED ORDER — ALPRAZOLAM 0.25 MG PO TABS: ORAL_TABLET | ORAL | 2 refills | Status: DC

## 2019-09-20 NOTE — Progress Notes (Signed)
Subjective:    Patient ID: Sherry Wong, female    DOB: [birth year], 63 y.o.   MRN: [id number]  HPI: Sherry Wong is a 63 y.o. female who returns for follow up appointment for chronic pain and medication refill. She states her pain is located in her lower back radiating into her bilateral lower extremities and bilateral feet with tingling and burning. She rates her pain 5. Her current exercise regime is walking and performing stretching exercises with bands.. Also reports last Thursday 09/16/2019 she was in her kitchen and experience being lightheaded and lost her balanced. She landed on her left side, she was able to pick herself up. She states she called her PCP office, she reports her PCP is on vacation. This provider instructed to F/U with her PCP appointment, she verbalizes understanding. Educated on Falls prevention, at the time of her fall she didn't have her cane, she was instructed to use her cane at all times, she verbalizes understanding. Sherry Wong refused to have this provider assist her with calling her PCP for F/U appointment, she states she will call today.   Sherry Wong Morphine equivalent is 120.00 MME.  She  is also prescribed Alprazolam. We have discussed the black box warning of using opioids and benzodiazepines. I highlighted the dangers of using these drugs together and discussed the adverse events including respiratory suppression, overdose, cognitive impairment and importance of compliance with current regimen. We will continue to monitor and adjust as indicated.   Last UDS was Performed on 05/24/2019, it was consistent.   Pain Inventory Average Pain 7 Pain Right Now 5 My pain is sharp, burning, stabbing, tingling and aching  In the last 24 hours, has pain interfered with the following? General activity 8 Relation with others 9 Enjoyment of life 9 What TIME of day is your pain at its worst? evening Sleep (in general) Fair  Pain is worse with: walking, bending,  sitting, standing and some activites Pain improves with: rest, heat/ice and medication Relief from Meds: 5  Mobility walk with assistance use a cane ability to climb steps?  yes do you drive?  yes  Function disabled: date disabled . I need assistance with the following:  meal prep, household duties and shopping  Neuro/Psych bladder control problems bowel control problems weakness numbness tremor tingling trouble walking dizziness depression anxiety  Prior Studies Any changes since last visit?  no  Physicians involved in your care Any changes since last visit?  no   Family History  Problem Relation Age of Onset  . Anesthesia problems Mother   . Anesthesia problems Sister   . Hypotension Neg Hx   . Malignant hyperthermia Neg Hx   . Pseudochol deficiency Neg Hx    Social History   Socioeconomic History  . Marital status: Married    Spouse name: Not on file  . Number of children: 0  . Years of education: 65  . Highest education level: Not on file  Occupational History  . Occupation: Disabled  Tobacco Use  . Smoking status: Never Smoker  . Smokeless tobacco: Never Used  Substance and Sexual Activity  . Alcohol use: Yes    Alcohol/week: 0.0 standard drinks    Comment: occ- 0.5 can beer every 2 months  . Drug use: No  . Sexual activity: Not Currently  Other Topics Concern  . Not on file  Social History Narrative   Lives at home with husband.   Caffeine use: 1 cup coffee 3 times per  week.    Social Determinants of Health   Financial Resource Strain:   . Difficulty of Paying Living Expenses: Not on file  Food Insecurity:   . Worried About Charity fundraiser in the Last Year: Not on file  . Ran Out of Food in the Last Year: Not on file  Transportation Needs:   . Lack of Transportation (Medical): Not on file  . Lack of Transportation (Non-Medical): Not on file  Physical Activity:   . Days of Exercise per Week: Not on file  . Minutes of Exercise per  Session: Not on file  Stress:   . Feeling of Stress : Not on file  Social Connections:   . Frequency of Communication with Friends and Family: Not on file  . Frequency of Social Gatherings with Friends and Family: Not on file  . Attends Religious Services: Not on file  . Active Member of Clubs or Organizations: Not on file  . Attends Archivist Meetings: Not on file  . Marital Status: Not on file   Past Surgical History:  Procedure Laterality Date  . BREAST BIOPSY     right  . BREAST SURGERY     left breast lumpectomy  . CHOLECYSTECTOMY    . COLONOSCOPY    . cysts removed from ovary    . DILATION AND CURETTAGE OF UTERUS    . ESOPHAGOGASTRODUODENOSCOPY    . HEMATOMA EVACUATION N/A 08/25/2013   Procedure: EVACUATION OF LUMBAR HEMATOMA;  Surgeon: Floyce Stakes, MD;  Location: [facility];  Service: Neurosurgery;  Laterality: N/A;  . LUMBAR LAMINECTOMY/DECOMPRESSION MICRODISCECTOMY  12/03/2011   Procedure: LUMBAR LAMINECTOMY/DECOMPRESSION MICRODISCECTOMY 2 LEVELS;  Surgeon: Floyce Stakes, MD;  Location: [place] [facility];  Service: Neurosurgery;  Laterality: Right;  Right Lumbar four-five Lumbar five sacral one Foraminotomies  . LUMBAR WOUND DEBRIDEMENT N/A 09/01/2013   Procedure: LUMBAR WOUND DEBRIDEMENT;  Surgeon: Floyce Stakes, MD;  Location: [facility];  Service: Neurosurgery;  Laterality: N/A;  . PLACEMENT OF LUMBAR DRAIN N/A 09/01/2013   Procedure: PLACEMENT OF LUMBAR DRAIN;  Surgeon: Floyce Stakes, MD;  Location: [facility];  Service: Neurosurgery;  Laterality: N/A;  . TRANSFORAMINAL LUMBAR INTERBODY FUSION (TLIF) WITH PEDICLE SCREW FIXATION 2 LEVEL N/A 08/25/2013   Procedure:  Lumbar four-five, Lumbar five-Sacral one Transforaminal Lumbar Interbody Fusion ;  Surgeon: Floyce Stakes, MD;  Location: [place] [facility];  Service: Neurosurgery;  Laterality: N/A;   Past Medical History:  Diagnosis Date  . Anxiety    takes celexa daily  . Arthritis   . Asthma   .  Bronchitis   . Chronic back pain    ddd/lumbago,and radiculopathy  . Depression   . Gastric ulcer   . GERD (gastroesophageal reflux disease)    takes Protonix daily  . Gout    hx of  . H/O hiatal hernia   . Hx of colonic polyps   . Hypertension    takes Losartan daily  . Hypothyroidism    takes Levothyroxine daily  . IBS (irritable bowel syndrome)   . Nocturia   . Pneumonia    hx of 15+yrs ago  . PONV (postoperative nausea and vomiting)   . Ulcerative colitis   . Urinary incontinence    There were no vitals taken for this visit.  Opioid Risk Score:   Fall Risk Score:  `1  Depression screen [facility] 2/9  Depression screen [facility] 2/9 03/24/2019 01/26/2019 07/20/2018 07/24/2017 01/22/2017 05/22/2016 03/20/2016  Decreased Interest  1 1 1 2 1  0 0  Down, Depressed, Hopeless 1 1 1 2 1  0 0  PHQ - 2 Score 2 2 2 4 2  0 0  Altered sleeping - - - - - - -  Tired, decreased energy - - - - - - -  Change in appetite - - - - - - -  Feeling bad or failure about yourself  - - - - - - -  Trouble concentrating - - - - - - -  Moving slowly or fidgety/restless - - - - - - -  Suicidal thoughts - - - - - - -  PHQ-9 Score - - - - - - -     Review of Systems  Constitutional: Positive for diaphoresis.  HENT: Negative.   Eyes: Negative.   Respiratory: Negative.   Cardiovascular: Negative.   Gastrointestinal: Positive for abdominal pain and nausea.  Endocrine: Negative.   Genitourinary: Positive for difficulty urinating.  Musculoskeletal: Positive for arthralgias and gait problem.  Skin: Negative.   Allergic/Immunologic: Negative.   Neurological: Positive for tremors, weakness and numbness.  Hematological: Negative.   Psychiatric/Behavioral: Positive for dysphoric mood. The patient is nervous/anxious.   All other systems reviewed and are negative.      Objective:   Physical Exam Vitals and nursing note reviewed.  Constitutional:      Appearance: Normal appearance. She is obese.    Cardiovascular:     Rate and Rhythm: Normal rate and regular rhythm.     Pulses: Normal pulses.     Heart sounds: Normal heart sounds.  Pulmonary:     Effort: Pulmonary effort is normal.     Breath sounds: Normal breath sounds.  Musculoskeletal:     Cervical back: Normal range of motion and neck supple.     Comments: Normal Muscle Bulk and Muscle Testing Reveals:  Upper Extremities: Full ROM and Muscle Strength 5/5  Lumbar Paraspinal Tenderness: L-3--5  Bilateral Greater Trochanter Tenderness Lower Extremities: Full ROM and Muscle Strength 5/5 Arises from Chair slowly using cane for support Antalgic Gait   Skin:    General: Skin is warm and dry.  Neurological:     Mental Status: She is alert and oriented to person, place, and time.  Psychiatric:        Mood and Affect: Mood normal.        Behavior: Behavior normal.           Assessment & Plan:  1. Cauda equina syndrome: Incontinence: Wearing Depends:Continue to Monitor.09/20/2019 2. Insomnia: Continue with Nortriptyline.09/20/2019 3. DDD L4-S1 s/p diskectomy with decompression of thecal sac: She continues to have back pain.Continue current medication regimen. 09/20/2019 Refilled: Percocet 10/325 mg #120--use 1 tablet every 6 hours as neededand MS Contin 30 mg one tablet every 12 hours as needed #60. Second script sent for the following month. We will continue the opioid monitoring program, this consists of regular clinic visits, examinations, urine drug screen, pill counts as well as use of [place] Controlled Substance Reporting System.. 4. Depression: Continue withCymbalta. Continue to Monitor.09/20/2019 5. Anxiety: Continue withcurrent medication regimen withXanax. Continue to Monitor.09/20/2019 6.Neuropathy:Continue:with current medication nregimen with Pamelor. 09/20/2019 7. Constipation: Continue Senokot.09/20/2019 8.Muscle Spasm: Continue Baclofen.09/20/2019 9. Lumbar Radiculitis: Continue  Pamelor.09/20/2019 10. Fall: Educated on Falls Prevention: She verbalizes understanding.   70minutes of face to face patient care time was spent during this visit. All questions were encouraged and answered.  F/U in 2 months

## 2019-09-29 ENCOUNTER — Other Ambulatory Visit: Payer: Self-pay | Admitting: Registered Nurse

## 2019-09-29 DIAGNOSIS — E039 Hypothyroidism, unspecified: Secondary | ICD-10-CM | POA: Diagnosis not present

## 2019-09-30 DIAGNOSIS — R197 Diarrhea, unspecified: Secondary | ICD-10-CM | POA: Diagnosis not present

## 2019-10-05 ENCOUNTER — Encounter: Payer: Self-pay | Admitting: Gastroenterology

## 2019-10-28 ENCOUNTER — Ambulatory Visit: Payer: Medicare Other | Admitting: Gastroenterology

## 2019-11-01 DIAGNOSIS — K219 Gastro-esophageal reflux disease without esophagitis: Secondary | ICD-10-CM | POA: Diagnosis not present

## 2019-11-01 DIAGNOSIS — E039 Hypothyroidism, unspecified: Secondary | ICD-10-CM | POA: Diagnosis not present

## 2019-11-01 DIAGNOSIS — E782 Mixed hyperlipidemia: Secondary | ICD-10-CM | POA: Diagnosis not present

## 2019-11-01 DIAGNOSIS — I10 Essential (primary) hypertension: Secondary | ICD-10-CM | POA: Diagnosis not present

## 2019-11-08 DIAGNOSIS — E782 Mixed hyperlipidemia: Secondary | ICD-10-CM | POA: Diagnosis not present

## 2019-11-08 DIAGNOSIS — E039 Hypothyroidism, unspecified: Secondary | ICD-10-CM | POA: Diagnosis not present

## 2019-11-08 DIAGNOSIS — Z0001 Encounter for general adult medical examination with abnormal findings: Secondary | ICD-10-CM | POA: Diagnosis not present

## 2019-11-08 DIAGNOSIS — R609 Edema, unspecified: Secondary | ICD-10-CM | POA: Diagnosis not present

## 2019-11-08 DIAGNOSIS — R300 Dysuria: Secondary | ICD-10-CM | POA: Diagnosis not present

## 2019-11-22 ENCOUNTER — Encounter: Payer: Self-pay | Admitting: Registered Nurse

## 2019-11-22 ENCOUNTER — Encounter: Payer: Medicare Other | Attending: Physical Medicine and Rehabilitation | Admitting: Registered Nurse

## 2019-11-22 ENCOUNTER — Other Ambulatory Visit: Payer: Self-pay

## 2019-11-22 VITALS — BP 135/88 | HR 86 | Temp 97.9°F | Ht 66.0 in | Wt 229.8 lb

## 2019-11-22 DIAGNOSIS — F411 Generalized anxiety disorder: Secondary | ICD-10-CM

## 2019-11-22 DIAGNOSIS — M961 Postlaminectomy syndrome, not elsewhere classified: Secondary | ICD-10-CM

## 2019-11-22 DIAGNOSIS — Z5181 Encounter for therapeutic drug level monitoring: Secondary | ICD-10-CM | POA: Diagnosis not present

## 2019-11-22 DIAGNOSIS — M5136 Other intervertebral disc degeneration, lumbar region: Secondary | ICD-10-CM | POA: Insufficient documentation

## 2019-11-22 DIAGNOSIS — G834 Cauda equina syndrome: Secondary | ICD-10-CM | POA: Insufficient documentation

## 2019-11-22 DIAGNOSIS — Z79891 Long term (current) use of opiate analgesic: Secondary | ICD-10-CM | POA: Insufficient documentation

## 2019-11-22 DIAGNOSIS — G894 Chronic pain syndrome: Secondary | ICD-10-CM | POA: Insufficient documentation

## 2019-11-22 DIAGNOSIS — M5416 Radiculopathy, lumbar region: Secondary | ICD-10-CM

## 2019-11-22 DIAGNOSIS — G039 Meningitis, unspecified: Secondary | ICD-10-CM | POA: Diagnosis not present

## 2019-11-22 DIAGNOSIS — F3289 Other specified depressive episodes: Secondary | ICD-10-CM

## 2019-11-22 DIAGNOSIS — Z79899 Other long term (current) drug therapy: Secondary | ICD-10-CM | POA: Insufficient documentation

## 2019-11-22 MED ORDER — MORPHINE SULFATE ER 30 MG PO TBCR: 30.0000 mg | EXTENDED_RELEASE_TABLET | Freq: Two times a day (BID) | ORAL | 0 refills | Status: DC

## 2019-11-22 MED ORDER — ALPRAZOLAM 0.25 MG PO TABS: ORAL_TABLET | ORAL | 2 refills | Status: DC

## 2019-11-22 MED ORDER — OXYCODONE-ACETAMINOPHEN 10-325 MG PO TABS: 1.0000 | ORAL_TABLET | Freq: Four times a day (QID) | ORAL | 0 refills | Status: DC | PRN

## 2019-11-22 MED ORDER — NORTRIPTYLINE HCL 50 MG PO CAPS: 50.0000 mg | ORAL_CAPSULE | Freq: Every day | ORAL | 3 refills | Status: DC

## 2019-11-22 NOTE — Progress Notes (Signed)
Subjective:    Patient ID: Sherry Wong, female    DOB: [birth year], 64 y.o.   MRN: [id number]  HPI: Sherry Wong is a 64 y.o. female who returns for follow up appointment for chronic pain and medication refill. She states her pain is located in her lower back pain radiating into her lower extremities and bilateral feet with tingling and burning. She rates her pain 5. Her current exercise regime is walking and performing stretching exercises.  Sherry Wong reports she had a fall three weeks ago, she was making her bed and became dizzy and fell backwards. She landed on her buttocks, she was able to pick herself up. She was seen by her PCP she states and was noted to have hypotension, her anti-hypertensive medications was discontinue she reports. She was instructed to keep a blood pressure log, she verbalizes understanding. Educated on falls prevention, she verbalizes understanding.   Sherry Wong Morphine equivalent is 120.00 MME.  She  is also prescribed Alprazolam. We have discussed the black box warning of using opioids and benzodiazepines. I highlighted the dangers of using these drugs together and discussed the adverse events including respiratory suppression, overdose, cognitive impairment and importance of compliance with current regimen. We will continue to monitor and adjust as indicated.    UDS ordered today.   Pain Inventory Average Pain 6 Pain Right Now 5 My pain is sharp, burning, dull, stabbing, tingling and aching  In the last 24 hours, has pain interfered with the following? General activity 9 Relation with others 9 Enjoyment of life 9 What TIME of day is your pain at its worst? all Sleep (in general) Fair  Pain is worse with: walking, bending, sitting, inactivity, standing and some activites Pain improves with: rest, heat/ice, pacing activities and medication Relief from Meds: 5  Mobility walk with assistance use a cane how many minutes can you walk? 10-15 ability  to climb steps?  yes do you drive?  yes  Function disabled: date disabled 04-30-12 I need assistance with the following:  bathing, meal prep, household duties and shopping  Neuro/Psych bladder control problems bowel control problems weakness numbness tingling trouble walking spasms dizziness depression anxiety  Prior Studies Any changes since last visit?  no bone scan x-rays CT/MRI  Physicians involved in your care Primary care [facility] Neurologist Dr. Jaynee Eagles Neurosurgeon Dr. Louanne Skye   Family History  Problem Relation Age of Onset  . Anesthesia problems Mother   . Anesthesia problems Sister   . Hypotension Neg Hx   . Malignant hyperthermia Neg Hx   . Pseudochol deficiency Neg Hx    Social History   Socioeconomic History  . Marital status: Married    Spouse name: Not on file  . Number of children: 0  . Years of education: 59  . Highest education level: Not on file  Occupational History  . Occupation: Disabled  Tobacco Use  . Smoking status: Never Smoker  . Smokeless tobacco: Never Used  Substance and Sexual Activity  . Alcohol use: Yes    Alcohol/week: 0.0 standard drinks    Comment: occ- 0.5 can beer every 2 months  . Drug use: No  . Sexual activity: Not Currently  Other Topics Concern  . Not on file  Social History Narrative   Lives at home with husband.   Caffeine use: 1 cup coffee 3 times per week.    Social Determinants of Health   Financial Resource Strain:   . Difficulty of Paying Living Expenses:  Not on file  Food Insecurity:   . Worried About Charity fundraiser in the Last Year: Not on file  . Ran Out of Food in the Last Year: Not on file  Transportation Needs:   . Lack of Transportation (Medical): Not on file  . Lack of Transportation (Non-Medical): Not on file  Physical Activity:   . Days of Exercise per Week: Not on file  . Minutes of Exercise per Session: Not on file  Stress:   . Feeling of Stress : Not on file  Social  Connections:   . Frequency of Communication with Friends and Family: Not on file  . Frequency of Social Gatherings with Friends and Family: Not on file  . Attends Religious Services: Not on file  . Active Member of Clubs or Organizations: Not on file  . Attends Archivist Meetings: Not on file  . Marital Status: Not on file   Past Surgical History:  Procedure Laterality Date  . BREAST BIOPSY     right  . BREAST SURGERY     left breast lumpectomy  . CHOLECYSTECTOMY    . COLONOSCOPY    . cysts removed from ovary    . DILATION AND CURETTAGE OF UTERUS    . ESOPHAGOGASTRODUODENOSCOPY    . HEMATOMA EVACUATION N/A 08/25/2013   Procedure: EVACUATION OF LUMBAR HEMATOMA;  Surgeon: Floyce Stakes, MD;  Location: [facility];  Service: Neurosurgery;  Laterality: N/A;  . LUMBAR LAMINECTOMY/DECOMPRESSION MICRODISCECTOMY  12/03/2011   Procedure: LUMBAR LAMINECTOMY/DECOMPRESSION MICRODISCECTOMY 2 LEVELS;  Surgeon: Floyce Stakes, MD;  Location: [place] [facility];  Service: Neurosurgery;  Laterality: Right;  Right Lumbar four-five Lumbar five sacral one Foraminotomies  . LUMBAR WOUND DEBRIDEMENT N/A 09/01/2013   Procedure: LUMBAR WOUND DEBRIDEMENT;  Surgeon: Floyce Stakes, MD;  Location: [facility];  Service: Neurosurgery;  Laterality: N/A;  . PLACEMENT OF LUMBAR DRAIN N/A 09/01/2013   Procedure: PLACEMENT OF LUMBAR DRAIN;  Surgeon: Floyce Stakes, MD;  Location: [facility];  Service: Neurosurgery;  Laterality: N/A;  . TRANSFORAMINAL LUMBAR INTERBODY FUSION (TLIF) WITH PEDICLE SCREW FIXATION 2 LEVEL N/A 08/25/2013   Procedure:  Lumbar four-five, Lumbar five-Sacral one Transforaminal Lumbar Interbody Fusion ;  Surgeon: Floyce Stakes, MD;  Location: [place] [facility];  Service: Neurosurgery;  Laterality: N/A;   Past Medical History:  Diagnosis Date  . Anxiety    takes celexa daily  . Arthritis   . Asthma   . Bronchitis   . Chronic back pain    ddd/lumbago,and radiculopathy  .  Depression   . Gastric ulcer   . GERD (gastroesophageal reflux disease)    takes Protonix daily  . Gout    hx of  . H/O hiatal hernia   . Hx of colonic polyps   . Hypertension    takes Losartan daily  . Hypothyroidism    takes Levothyroxine daily  . IBS (irritable bowel syndrome)   . Nocturia   . Pneumonia    hx of 15+yrs ago  . PONV (postoperative nausea and vomiting)   . Ulcerative colitis   . Urinary incontinence    BP 135/88   Pulse 86   Temp 97.9 F (36.6 C)   Ht 5\' 6"  (1.676 m)   Wt 229 lb 12.8 oz (104.2 kg)   SpO2 98%   BMI 37.09 kg/m   Opioid Risk Score:   Fall Risk Score:  `1  Depression screen PHQ 2/9  Depression screen [facility] 2/9  03/24/2019 01/26/2019 07/20/2018 07/24/2017 01/22/2017 05/22/2016 03/20/2016  Decreased Interest 1 1 1 2 1  0 0  Down, Depressed, Hopeless 1 1 1 2 1  0 0  PHQ - 2 Score 2 2 2 4 2  0 0  Altered sleeping - - - - - - -  Tired, decreased energy - - - - - - -  Change in appetite - - - - - - -  Feeling bad or failure about yourself  - - - - - - -  Trouble concentrating - - - - - - -  Moving slowly or fidgety/restless - - - - - - -  Suicidal thoughts - - - - - - -  PHQ-9 Score - - - - - - -    Review of Systems  Musculoskeletal: Positive for gait problem.  Neurological: Positive for dizziness, weakness and numbness.  Psychiatric/Behavioral: Positive for dysphoric mood. The patient is nervous/anxious.   All other systems reviewed and are negative.      Objective:   Physical Exam Vitals and nursing note reviewed.  Constitutional:      Appearance: Normal appearance. She is obese.  Cardiovascular:     Rate and Rhythm: Normal rate and regular rhythm.     Pulses: Normal pulses.     Heart sounds: Normal heart sounds.  Pulmonary:     Effort: Pulmonary effort is normal.     Breath sounds: Normal breath sounds.  Musculoskeletal:     Cervical back: Normal range of motion and neck supple.     Comments: Normal Muscle Bulk and Muscle Testing  Reveals:  Upper Extremities: Full ROM and Muscle Strength 5/5 Lumbar Paraspinal Tenderness: L-3-L-5 Lower Extremities: Full ROM and Muscle Strength 5/5 Arises from chair slowly using cane for support Narrow Based Gait   Skin:    General: Skin is warm and dry.  Neurological:     Mental Status: She is alert and oriented to person, place, and time.  Psychiatric:        Mood and Affect: Mood normal.        Behavior: Behavior normal.           Assessment & Plan:  1. Cauda equina syndrome: Incontinence: Wearing Depends:Continue to Monitor.11/22/2019. 2. Insomnia: Continue with Nortriptyline.11/22/2019 3. DDD L4-S1 s/p diskectomy with decompression of thecal sac: She continues to have back pain.Continue current medication regimen.11/22/2019 Refilled: Percocet 10/325 mg #120--use 1 tablet every 6 hours as neededand MS Contin 30 mg one tablet every 12 hours as needed #60. Second script sent for the following month. We will continue the opioid monitoring program, this consists of regular clinic visits, examinations, urine drug screen, pill counts as well as use of [place] Controlled Substance Reporting System.. 4. Depression: Continue withCymbalta. Continue to Monitor.11/22/2019 5. Anxiety: Continue withcurrent medication regimen withXanax. Continue to Monitor.11/22/2019 6.Neuropathy:Continue:with current medication nregimen with Pamelor.11/22/2019 7. Constipation: Continue Senokot.11/22/2019 8.Muscle Spasm: Continue Baclofen.11/22/2019 9. Lumbar Radiculitis: Continue Pamelor.11/22/2019 10. Fall: Educated on Falls Prevention: She verbalizes understanding.  82minutes of face to face patient care time was spent during this visit. All questions were encouraged and answered.  F/U in 2 months

## 2019-11-23 ENCOUNTER — Ambulatory Visit: Payer: Medicare Other | Admitting: Registered Nurse

## 2019-11-25 LAB — TOXASSURE SELECT,+ANTIDEPR,UR

## 2019-11-29 ENCOUNTER — Ambulatory Visit: Payer: Medicare Other | Admitting: Gastroenterology

## 2019-11-29 ENCOUNTER — Encounter: Payer: Self-pay | Admitting: Gastroenterology

## 2019-11-29 ENCOUNTER — Other Ambulatory Visit: Payer: Self-pay

## 2019-11-29 ENCOUNTER — Other Ambulatory Visit (INDEPENDENT_AMBULATORY_CARE_PROVIDER_SITE_OTHER): Payer: Medicare Other

## 2019-11-29 VITALS — BP 144/80 | HR 84 | Temp 98.3°F | Ht 67.0 in | Wt 228.2 lb

## 2019-11-29 DIAGNOSIS — R112 Nausea with vomiting, unspecified: Secondary | ICD-10-CM

## 2019-11-29 DIAGNOSIS — R1310 Dysphagia, unspecified: Secondary | ICD-10-CM | POA: Diagnosis not present

## 2019-11-29 DIAGNOSIS — R159 Full incontinence of feces: Secondary | ICD-10-CM

## 2019-11-29 DIAGNOSIS — R197 Diarrhea, unspecified: Secondary | ICD-10-CM | POA: Diagnosis not present

## 2019-11-29 DIAGNOSIS — Z01818 Encounter for other preprocedural examination: Secondary | ICD-10-CM | POA: Diagnosis not present

## 2019-11-29 LAB — CBC WITH DIFFERENTIAL/PLATELET
Basophils Absolute: 0 10*3/uL (ref 0.0–0.1)
Basophils Relative: 1.1 % (ref 0.0–3.0)
Eosinophils Absolute: 0.2 10*3/uL (ref 0.0–0.7)
Eosinophils Relative: 3.4 % (ref 0.0–5.0)
HCT: 34.8 % — ABNORMAL LOW (ref 36.0–46.0)
Hemoglobin: 11.6 g/dL — ABNORMAL LOW (ref 12.0–15.0)
Lymphocytes Relative: 31.4 % (ref 12.0–46.0)
Lymphs Abs: 1.4 10*3/uL (ref 0.7–4.0)
MCHC: 33.4 g/dL (ref 30.0–36.0)
MCV: 86.2 fl (ref 78.0–100.0)
Monocytes Absolute: 0.3 10*3/uL (ref 0.1–1.0)
Monocytes Relative: 6.6 % (ref 3.0–12.0)
Neutro Abs: 2.5 10*3/uL (ref 1.4–7.7)
Neutrophils Relative %: 57.5 % (ref 43.0–77.0)
Platelets: 207 10*3/uL (ref 150.0–400.0)
RBC: 4.03 Mil/uL (ref 3.87–5.11)
RDW: 15.1 % (ref 11.5–15.5)
WBC: 4.4 10*3/uL (ref 4.0–10.5)

## 2019-11-29 LAB — COMPREHENSIVE METABOLIC PANEL
ALT: 7 U/L (ref 0–35)
AST: 13 U/L (ref 0–37)
Albumin: 3.8 g/dL (ref 3.5–5.2)
Alkaline Phosphatase: 98 U/L (ref 39–117)
BUN: 5 mg/dL — ABNORMAL LOW (ref 6–23)
CO2: 32 mEq/L (ref 19–32)
Calcium: 9.3 mg/dL (ref 8.4–10.5)
Chloride: 104 mEq/L (ref 96–112)
Creatinine, Ser: 0.7 mg/dL (ref 0.40–1.20)
GFR: 84.06 mL/min (ref >60.00)
Glucose, Bld: 90 mg/dL (ref 70–99)
Potassium: 3.6 mEq/L (ref 3.5–5.1)
Sodium: 142 mEq/L (ref 135–145)
Total Bilirubin: 0.6 mg/dL (ref 0.2–1.2)
Total Protein: 7.3 g/dL (ref 6.0–8.3)

## 2019-11-29 LAB — IGA: IgA: 164 mg/dL (ref 68–378)

## 2019-11-29 LAB — HIGH SENSITIVITY CRP: CRP, High Sensitivity: 4.11 mg/L (ref 0.000–5.000)

## 2019-11-29 MED ORDER — SUPREP BOWEL PREP KIT 17.5-3.13-1.6 GM/177ML PO SOLN: ORAL | 0 refills | Status: DC

## 2019-11-29 NOTE — Patient Instructions (Signed)
You have been scheduled for an endoscopy and colonoscopy. Please follow the written instructions given to you at your visit today. Please pick up your prep supplies at the pharmacy within the next 1-3 days. If you use inhalers (even only as needed), please bring them with you on the day of your procedure.   Go to the basement for labs today  Turn in GI Pathogen panel soon, we need results before you have the colonoscopy  Take Fiberchoice 1 tablet twice a day  Take benefiber 1 tablespoon twice a day   Lactose-Free Diet, Adult If you have lactose intolerance, you are not able to digest lactose. Lactose is a natural sugar found mainly in dairy milk and dairy products. You may need to avoid all foods and beverages that contain lactose. A lactose-free diet can help you do this. Which foods have lactose? Lactose is found in dairy milk and dairy products, such as:  Yogurt.  Cheese.  Butter.  Margarine.  Sour cream.  Cream.  Whipped toppings and nondairy creamers.  Ice cream and other dairy-based desserts. Lactose is also found in foods or products made with dairy milk or milk ingredients. To find out whether a food contains dairy milk or a milk ingredient, look at the ingredients list. Avoid foods with the statement "May contain milk" and foods that contain:  Milk powder.  Whey.  Curd.  Caseinate.  Lactose.  Lactalbumin.  Lactoglobulin. What are alternatives to dairy milk and foods made with milk products?  Lactose-free milk.  Soy milk with added calcium and vitamin D.  Almond milk, coconut milk, rice milk, or other nondairy milk alternatives with added calcium and vitamin D. Note that these are low in protein.  Soy products, such as soy yogurt, soy cheese, soy ice cream, and soy-based sour cream.  Other nut milk products, such as almond yogurt, almond cheese, cashew yogurt, cashew cheese, cashew ice cream, coconut yogurt, and coconut ice cream. What are tips for  following this plan?  Do not consume foods, beverages, vitamins, minerals, or medicines containing lactose. Read ingredient lists carefully.  Look for the words "lactose-free" on labels.  Use lactase enzyme drops or tablets as directed by your health care provider.  Use lactose-free milk or a milk alternative, such as soy milk or almond milk, for drinking and cooking.  Make sure you get enough calcium and vitamin D in your diet. A lactose-free eating plan can be lacking in these important nutrients.  Take calcium and vitamin D supplements as directed by your health care provider. Talk to your health care provider about supplements if you are not able to get enough calcium and vitamin D from food. What foods can I eat?  Fruits All fresh, canned, frozen, or dried fruits that are not processed with lactose. Vegetables All fresh, frozen, and canned vegetables without cheese, cream, or butter sauces. Grains Any that are not made with dairy milk or dairy products. Meats and other proteins Any meat, fish, poultry, and other protein sources that are not made with dairy milk or dairy products. Soy cheese and yogurt. Fats and oils Any that are not made with dairy milk or dairy products. Beverages Lactose-free milk. Soy, rice, or almond milk with added calcium and vitamin D. Fruit and vegetable juices. Sweets and desserts Any that are not made with dairy milk or dairy products. Seasonings and condiments Any that are not made with dairy milk or dairy products. Calcium Calcium is found in many foods that contain lactose  and is important for bone health. The amount of calcium you need depends on your age:  Adults younger than 50 years: 1,000 mg of calcium a day.  Adults older than 50 years: 1,200 mg of calcium a day. If you are not getting enough calcium, you may get it from other sources, including:  Orange juice with calcium added. There are 300-350 mg of calcium in 1 cup of orange juice.   Calcium-fortified soy milk. There are 300-400 mg of calcium in 1 cup of calcium-fortified soy milk.  Calcium-fortified rice or almond milk. There are 300 mg of calcium in 1 cup of calcium-fortified rice or almond milk.  Calcium-fortified breakfast cereals. There are 100-1,000 mg of calcium in calcium-fortified breakfast cereals.  Spinach, cooked. There are 145 mg of calcium in  cup of cooked spinach.  Edamame, cooked. There are 130 mg of calcium in  cup of cooked edamame.  Collard greens, cooked. There are 125 mg of calcium in  cup of cooked collard greens.  Kale, frozen or cooked. There are 90 mg of calcium in  cup of cooked or frozen kale.  Almonds. There are 95 mg of calcium in  cup of almonds.  Broccoli, cooked. There are 60 mg of calcium in 1 cup of cooked broccoli. The items listed above may not be a complete list of recommended foods and beverages. Contact a dietitian for more options. What foods are not recommended? Fruits None, unless they are made with dairy milk or dairy products. Vegetables None, unless they are made with dairy milk or dairy products. Grains Any grains that are made with dairy milk or dairy products. Meats and other proteins None, unless they are made with dairy milk or dairy products. Dairy All dairy products, including milk, goat's milk, buttermilk, kefir, acidophilus milk, flavored milk, evaporated milk, condensed milk, dulce de [place], eggnog, yogurt, cheese, and cheese spreads. Fats and oils Any that are made with milk or milk products. Margarines and salad dressings that contain milk or cheese. Cream. Half and half. Cream cheese. Sour cream. Chip dips made with sour cream or yogurt. Beverages Hot chocolate. Cocoa with lactose. Instant iced teas. Powdered fruit drinks. Smoothies made with dairy milk or yogurt. Sweets and desserts Any that are made with milk or milk products. Seasonings and condiments Chewing gum that has lactose. Spice  blends if they contain lactose. Artificial sweeteners that contain lactose. Nondairy creamers. The items listed above may not be a complete list of foods and beverages to avoid. Contact a dietitian for more information. Summary  If you are lactose intolerant, it means that you have a hard time digesting lactose, a natural sugar found in milk and milk products.  Following a lactose-free diet can help you manage this condition.  Calcium is important for bone health and is found in many foods that contain lactose. Talk with your health care provider about other sources of calcium. This information is not intended to replace advice given to you by your health care provider. Make sure you discuss any questions you have with your health care provider. Document Revised: 10/14/2017 Document Reviewed: 10/14/2017 Elsevier Patient Education  [place].   I appreciate the  opportunity to care for you  Thank You   Harl Bowie , MD

## 2019-11-29 NOTE — Progress Notes (Signed)
Sherry Wong    [id number]    [birth year]  Primary Care Physician:Skillman, Aundra Millet, PA-C  Referring Physician: Rosalee Kaufman, PA-C [place],  [place] [address]   Chief complaint:  Diarrhea, fecal incontinence, dysphagia  HPI: 84 yr F here for new patient visit with c/o diarrhea , fecal incontinence and BRBPR She is having 8-10 bowel movement per day, symptoms started around last April/May. She tried a course of antibiotics for possible infectious diarrhea prescribed by PCP with no improvement. She wakes up once or twice at night with fecal urgency and diarrhea. She has intermittent lower abd cramping. Occasional nausea but no vomiting.  She notices BRBRP when she wipes after BM few times a week.   No family history of IBD and colon cancer.  Denies any travel , diet or medication changes.    Outpatient Encounter Medications as of 11/29/2019  Medication Sig  . albuterol (PROVENTIL HFA;VENTOLIN HFA) 108 (90 BASE) MCG/ACT inhaler Inhale 2 puffs into the lungs 2 (two) times daily as needed.   . ALPRAZolam (XANAX) 0.25 MG tablet TAKE 1 TABLET 2 TIMES A DAY AS NEEDED FOR ANXIETY.  . baclofen (LIORESAL) 20 MG tablet TAKE 1 TABLET BY MOUTH 3 TIMES DAILY. (Patient taking differently: Prn)  . Cholecalciferol (VITAMIN D3) 125 MCG (5000 UT) CAPS Take 2 capsules by mouth daily.  . DULoxetine (CYMBALTA) 60 MG capsule Take 60 mg by mouth daily.  . hyoscyamine (LEVSIN) 0.125 MG tablet Take 0.125 mg by mouth every 4 (four) hours as needed.  [name] levothyroxine (SYNTHROID) 137 MCG tablet Take 137 mcg by mouth daily before breakfast.  . morphine (MS CONTIN) 30 MG 12 hr tablet Take 1 tablet (30 mg total) by mouth every 12 (twelve) hours.  . nortriptyline (PAMELOR) 50 MG capsule Take 1 capsule (50 mg total) by mouth at bedtime.  [name] oxyCODONE-acetaminophen (PERCOCET) 10-325 MG tablet Take 1 tablet by mouth every 6 (six) hours as needed for pain (for severe pain). May have  extra tablet on days when pain is severe.  . pantoprazole (PROTONIX) 40 MG tablet Take 40 mg by mouth daily.  . simvastatin (ZOCOR) 20 MG tablet Take 20 mg by mouth daily.  . vitamin B-12 (CYANOCOBALAMIN) 1000 MCG tablet Take 1,000 mcg by mouth daily.  . [DISCONTINUED] hydrochlorothiazide (HYDRODIURIL) 25 MG tablet   . [DISCONTINUED] levothyroxine (SYNTHROID, LEVOTHROID) 150 MCG tablet Take 137.5 mcg by mouth daily before breakfast. 137.42mcg  . [DISCONTINUED] potassium chloride SA (K-DUR,KLOR-CON) 20 MEQ tablet Take 1 tablet (20 mEq total) by mouth 2 (two) times daily for 5 days.  . [DISCONTINUED] senna-docusate (SENOKOT-S) 8.6-50 MG per tablet Take 2 tablets by mouth at bedtime. Bowel program--over the counter   No facility-administered encounter medications on file as of 11/29/2019.    Allergies as of 11/29/2019 - Review Complete 11/29/2019  Allergen Reaction Noted  . Codeine Nausea Only and Other (See Comments) 06/21/2011  . Tegretol [carbamazepine] Other (See Comments) 07/24/2017    Past Medical History:  Diagnosis Date  . Anxiety    takes celexa daily  . Arthritis   . Asthma   . Bronchitis   . Chronic back pain    ddd/lumbago,and radiculopathy  . Depression   . Gastric ulcer   . GERD (gastroesophageal reflux disease)    takes Protonix daily  . Gout    hx of  . H/O hiatal hernia   . Hx of colonic polyps   .  Hypertension    takes Losartan daily  . Hypothyroidism    takes Levothyroxine daily  . IBS (irritable bowel syndrome)   . Nocturia   . Pneumonia    hx of 15+yrs ago  . PONV (postoperative nausea and vomiting)   . Ulcerative colitis   . Urinary incontinence     Past Surgical History:  Procedure Laterality Date  . BREAST BIOPSY     right  . BREAST LUMPECTOMY Left 2014  . CHOLECYSTECTOMY     "gangrene in small intestines clipped by surgeon" per patient  . COLONOSCOPY  2009   Dr Rowe Pavy   . cysts removed from ovary    . DILATION AND CURETTAGE OF UTERUS    .  ELBOW SURGERY Left   . ESOPHAGOGASTRODUODENOSCOPY    . HEMATOMA EVACUATION N/A 08/25/2013   Procedure: EVACUATION OF LUMBAR HEMATOMA;  Surgeon: Floyce Stakes, MD;  Location: [facility];  Service: Neurosurgery;  Laterality: N/A;  . LUMBAR LAMINECTOMY/DECOMPRESSION MICRODISCECTOMY  12/03/2011   Procedure: LUMBAR LAMINECTOMY/DECOMPRESSION MICRODISCECTOMY 2 LEVELS;  Surgeon: Floyce Stakes, MD;  Location: [facility];  Service: Neurosurgery;  Laterality: Right;  Right Lumbar four-five Lumbar five sacral one Foraminotomies  . LUMBAR WOUND DEBRIDEMENT N/A 09/01/2013   Procedure: LUMBAR WOUND DEBRIDEMENT;  Surgeon: Floyce Stakes, MD;  Location: [facility];  Service: Neurosurgery;  Laterality: N/A;  . PLACEMENT OF LUMBAR DRAIN N/A 09/01/2013   Procedure: PLACEMENT OF LUMBAR DRAIN;  Surgeon: Floyce Stakes, MD;  Location: [facility];  Service: Neurosurgery;  Laterality: N/A;  . TRANSFORAMINAL LUMBAR INTERBODY FUSION (TLIF) WITH PEDICLE SCREW FIXATION 2 LEVEL N/A 08/25/2013   Procedure:  Lumbar four-five, Lumbar five-Sacral one Transforaminal Lumbar Interbody Fusion ;  Surgeon: Floyce Stakes, MD;  Location: [facility];  Service: Neurosurgery;  Laterality: N/A;    Family History  Problem Relation Age of Onset  . Anesthesia problems Mother   . COPD Mother   . Diabetes Mother   . Anesthesia problems Sister   . Hypotension Neg Hx   . Malignant hyperthermia Neg Hx   . Pseudochol deficiency Neg Hx   . Colon cancer Neg Hx   . Esophageal cancer Neg Hx   . Liver disease Neg Hx   . Pancreatic cancer Neg Hx     Social History   Socioeconomic History  . Marital status: Married    Spouse name: Not on file  . Number of children: 0  . Years of education: 41  . Highest education level: Not on file  Occupational History  . Occupation: Disabled  Tobacco Use  . Smoking status: Never Smoker  . Smokeless tobacco: Never Used  Substance and Sexual Activity  . Alcohol use: Yes     Alcohol/week: 0.0 standard drinks    Comment: occ- 0.5 can beer every 2 months  . Drug use: No  . Sexual activity: Not Currently  Other Topics Concern  . Not on file  Social History Narrative   Lives at home with husband.   Caffeine use: 1 cup coffee 3 times per week.    Social Determinants of Health   Financial Resource Strain:   . Difficulty of Paying Living Expenses: Not on file  Food Insecurity:   . Worried About Charity fundraiser in the Last Year: Not on file  . Ran Out of Food in the Last Year: Not on file  Transportation Needs:   . Lack of Transportation (Medical): Not on file  .  Lack of Transportation (Non-Medical): Not on file  Physical Activity:   . Days of Exercise per Week: Not on file  . Minutes of Exercise per Session: Not on file  Stress:   . Feeling of Stress : Not on file  Social Connections:   . Frequency of Communication with Friends and Family: Not on file  . Frequency of Social Gatherings with Friends and Family: Not on file  . Attends Religious Services: Not on file  . Active Member of Clubs or Organizations: Not on file  . Attends Archivist Meetings: Not on file  . Marital Status: Not on file  Intimate Partner Violence:   . Fear of Current or Ex-Partner: Not on file  . Emotionally Abused: Not on file  . Physically Abused: Not on file  . Sexually Abused: Not on file      Review of systems: All other review of systems negative except as mentioned in the HPI.   Physical Exam: Vitals:   11/29/19 1343  BP: (!) 144/80  Pulse: 84  Temp: 98.3 F (36.8 C)   Body mass index is 35.74 kg/m. Gen:      No acute distress Abd:      + bowel sounds; soft, non-tender; no palpable masses, no distension Neuro: alert and oriented x 3 Psych: normal mood and affect  Data Reviewed:  Reviewed labs, radiology imaging, old records and pertinent past GI work up   Assessment and Plan/Recommendations:  41 yr F with diarrhea, fecal urgency and  fecal incontinence  Trial of Lactose free diet Start Benefiber 1 tablespoon BID with meals  Check GI pathogen panel to exclude C.diff or infectious etiology  Check CBC, CMP, CRP  TTG IgA Ab to exclude celiac disease  Given the chronicity of her symptoms will schedule for colonoscopy with biopsies to evaluate for microscopic colitis and also exclude IBD  The risks and benefits as well as alternatives of endoscopic procedure(s) have been discussed and reviewed. All questions answered. The patient agrees to proceed.   The patient was provided an opportunity to ask questions and all were answered. The patient agreed with the plan and demonstrated an understanding of the instructions.  Damaris Hippo , MD    CC: Rosalee Kaufman, *

## 2019-11-30 LAB — TISSUE TRANSGLUTAMINASE ABS,IGG,IGA
(tTG) Ab, IgA: 1 U/mL
(tTG) Ab, IgG: 2 U/mL

## 2019-12-02 ENCOUNTER — Other Ambulatory Visit: Payer: Medicare Other

## 2019-12-02 DIAGNOSIS — R197 Diarrhea, unspecified: Secondary | ICD-10-CM

## 2019-12-02 DIAGNOSIS — R159 Full incontinence of feces: Secondary | ICD-10-CM

## 2019-12-02 DIAGNOSIS — R1310 Dysphagia, unspecified: Secondary | ICD-10-CM

## 2019-12-03 ENCOUNTER — Encounter: Payer: Self-pay | Admitting: Gastroenterology

## 2019-12-07 ENCOUNTER — Telehealth: Payer: Self-pay

## 2019-12-07 LAB — GASTROINTESTINAL PATHOGEN PANEL PCR
C. difficile Tox A/B, PCR: NOT DETECTED
Campylobacter, PCR: NOT DETECTED
Cryptosporidium, PCR: NOT DETECTED
E coli (ETEC) LT/ST PCR: NOT DETECTED
E coli (STEC) stx1/stx2, PCR: NOT DETECTED
E coli 0157, PCR: NOT DETECTED
Giardia lamblia, PCR: NOT DETECTED
Norovirus, PCR: NOT DETECTED
Rotavirus A, PCR: NOT DETECTED
Salmonella, PCR: NOT DETECTED
Shigella, PCR: NOT DETECTED

## 2019-12-07 NOTE — Telephone Encounter (Signed)
UDS RESULTS CONSISTENT WITH MEDICATION RX

## 2019-12-17 ENCOUNTER — Other Ambulatory Visit: Payer: Self-pay | Admitting: Gastroenterology

## 2019-12-17 ENCOUNTER — Other Ambulatory Visit: Payer: Self-pay

## 2019-12-17 ENCOUNTER — Ambulatory Visit (INDEPENDENT_AMBULATORY_CARE_PROVIDER_SITE_OTHER): Payer: Medicare Other

## 2019-12-17 ENCOUNTER — Encounter: Payer: Self-pay | Admitting: Gastroenterology

## 2019-12-17 DIAGNOSIS — Z1159 Encounter for screening for other viral diseases: Secondary | ICD-10-CM

## 2019-12-18 LAB — SARS CORONAVIRUS 2 (TAT 6-24 HRS): SARS Coronavirus 2: NEGATIVE

## 2019-12-20 ENCOUNTER — Other Ambulatory Visit: Payer: Self-pay

## 2019-12-20 ENCOUNTER — Encounter: Payer: Self-pay | Admitting: Gastroenterology

## 2019-12-20 ENCOUNTER — Ambulatory Visit (AMBULATORY_SURGERY_CENTER): Payer: Medicare Other | Admitting: Gastroenterology

## 2019-12-20 VITALS — BP 133/76 | HR 77 | Temp 98.0°F | Resp 13 | Ht 67.0 in | Wt 228.0 lb

## 2019-12-20 DIAGNOSIS — K3189 Other diseases of stomach and duodenum: Secondary | ICD-10-CM | POA: Diagnosis not present

## 2019-12-20 DIAGNOSIS — K2970 Gastritis, unspecified, without bleeding: Secondary | ICD-10-CM

## 2019-12-20 DIAGNOSIS — R197 Diarrhea, unspecified: Secondary | ICD-10-CM | POA: Diagnosis not present

## 2019-12-20 DIAGNOSIS — K52832 Lymphocytic colitis: Secondary | ICD-10-CM

## 2019-12-20 DIAGNOSIS — K2950 Unspecified chronic gastritis without bleeding: Secondary | ICD-10-CM | POA: Diagnosis not present

## 2019-12-20 DIAGNOSIS — R1310 Dysphagia, unspecified: Secondary | ICD-10-CM | POA: Diagnosis not present

## 2019-12-20 DIAGNOSIS — E039 Hypothyroidism, unspecified: Secondary | ICD-10-CM | POA: Diagnosis not present

## 2019-12-20 DIAGNOSIS — K2990 Gastroduodenitis, unspecified, without bleeding: Secondary | ICD-10-CM

## 2019-12-20 DIAGNOSIS — R5383 Other fatigue: Secondary | ICD-10-CM | POA: Diagnosis not present

## 2019-12-20 DIAGNOSIS — R112 Nausea with vomiting, unspecified: Secondary | ICD-10-CM | POA: Diagnosis not present

## 2019-12-20 MED ORDER — SODIUM CHLORIDE 0.9 % IV SOLN: 500.0000 mL | Freq: Once | INTRAVENOUS | Status: DC

## 2019-12-20 NOTE — Progress Notes (Signed)
Temp by LC Vitals by KA

## 2019-12-20 NOTE — Op Note (Signed)
[facility] Patient Name: Sherry Wong Procedure Date: 12/20/2019 2:57 PM MRN: [id number] Endoscopist: Mauri Pole , MD Age: 64 Referring MD:  Date of Birth: 1956/04/03 Gender: Female Account #: [id number] Procedure:                Upper GI endoscopy Indications:              Dysphagia Medicines:                Monitored Anesthesia Care Procedure:                Pre-Anesthesia Assessment:                           - Prior to the procedure, a History and Physical                            was performed, and patient medications and                            allergies were reviewed. The patient's tolerance of                            previous anesthesia was also reviewed. The risks                            and benefits of the procedure and the sedation                            options and risks were discussed with the patient.                            All questions were answered, and informed consent                            was obtained. Prior Anticoagulants: The patient has                            taken no previous anticoagulant or antiplatelet                            agents. ASA Grade Assessment: II - A patient with                            mild systemic disease. After reviewing the risks                            and benefits, the patient was deemed in                            satisfactory condition to undergo the procedure.                           After obtaining informed consent, the endoscope was  passed under direct vision. Throughout the                            procedure, the patient's blood pressure, pulse, and                            oxygen saturations were monitored continuously. The                            Endoscope was introduced through the mouth, and                            advanced to the second part of duodenum. The upper                            GI endoscopy was accomplished without  difficulty.                            The patient tolerated the procedure well. Scope In: Scope Out: Findings:                 The Z-line was regular and was found 35 cm from the                            incisors.                           Patchy moderate inflammation characterized by                            congestion (edema), erosions, erythema, friability                            and granularity was found in the entire examined                            stomach. Biopsies were taken with a cold forceps                            for Helicobacter pylori testing.                           The examined duodenum was normal. Complications:            No immediate complications. Estimated Blood Loss:     Estimated blood loss was minimal. Impression:               - Z-line regular, 35 cm from the incisors.                           - Gastritis. Biopsied.                           - Normal examined duodenum. Recommendation:           - Resume previous diet.                           -  Continue present medications.                           - Await pathology results.                           - See the other procedure note for documentation of                            additional recommendations. Mauri Pole, MD 12/20/2019 3:46:49 PM This report has been signed electronically.

## 2019-12-20 NOTE — Patient Instructions (Signed)
Handout provided on Gastritis.  YOU HAD AN ENDOSCOPIC PROCEDURE TODAY AT [facility]:   Refer to the procedure report that was given to you for any specific questions about what was found during the examination.  If the procedure report does not answer your questions, please call your gastroenterologist to clarify.  If you requested that your care partner not be given the details of your procedure findings, then the procedure report has been included in a sealed envelope for you to review at your convenience later.  YOU SHOULD EXPECT: Some feelings of bloating in the abdomen. Passage of more gas than usual.  Walking can help get rid of the air that was put into your GI tract during the procedure and reduce the bloating. If you had a lower endoscopy (such as a colonoscopy or flexible sigmoidoscopy) you may notice spotting of blood in your stool or on the toilet paper. If you underwent a bowel prep for your procedure, you may not have a normal bowel movement for a few days.  Please Note:  You might notice some irritation and congestion in your nose or some drainage.  This is from the oxygen used during your procedure.  There is no need for concern and it should clear up in a day or so.  SYMPTOMS TO REPORT IMMEDIATELY:   Following lower endoscopy (colonoscopy or flexible sigmoidoscopy):  Excessive amounts of blood in the stool  Significant tenderness or worsening of abdominal pains  Swelling of the abdomen that is new, acute  Fever of 100F or higher   Following upper endoscopy (EGD)  Vomiting of blood or coffee ground material  New chest pain or pain under the shoulder blades  Painful or persistently difficult swallowing  New shortness of breath  Fever of 100F or higher  Black, tarry-looking stools  For urgent or emergent issues, a gastroenterologist can be reached at any hour by calling [phone]. Do not use MyChart messaging for urgent concerns.    DIET:  We do  recommend a small meal at first, but then you may proceed to your regular diet.  Drink plenty of fluids but you should avoid alcoholic beverages for 24 hours.  ACTIVITY:  You should plan to take it easy for the rest of today and you should NOT DRIVE or use heavy machinery until tomorrow (because of the sedation medicines used during the test).    FOLLOW UP: Our staff will call the number listed on your records 48-72 hours following your procedure to check on you and address any questions or concerns that you may have regarding the information given to you following your procedure. If we do not reach you, we will leave a message.  We will attempt to reach you two times.  During this call, we will ask if you have developed any symptoms of COVID 19. If you develop any symptoms (ie: fever, flu-like symptoms, shortness of breath, cough etc.) before then, please call [phone].  If you test positive for Covid 19 in the 2 weeks post procedure, please call and report this information to [place].    If any biopsies were taken you will be contacted by phone or by letter within the next 1-3 weeks.  Please call us at [phone] if you have not heard about the biopsies in 3 weeks.    SIGNATURES/CONFIDENTIALITY: You and/or your care partner have signed paperwork which will be entered into your electronic medical record.  These signatures attest to the fact that  that the information above on your After Visit Summary has been reviewed and is understood.  Full responsibility of the confidentiality of this discharge information lies with you and/or your care-partner.

## 2019-12-20 NOTE — Op Note (Addendum)
[facility] Patient Name: Sherry Wong Procedure Date: 12/20/2019 2:57 PM MRN: [id number] Endoscopist: Mauri Pole , MD Age: 64 Referring MD:  Date of Birth: [birth year] Gender: Female Account #: [id number] Procedure:                Colonoscopy Indications:              Clinically significant diarrhea of unexplained                            origin Medicines:                Monitored Anesthesia Care Procedure:                Pre-Anesthesia Assessment:                           - Prior to the procedure, a History and Physical                            was performed, and patient medications and                            allergies were reviewed. The patient's tolerance of                            previous anesthesia was also reviewed. The risks                            and benefits of the procedure and the sedation                            options and risks were discussed with the patient.                            All questions were answered, and informed consent                            was obtained. Prior Anticoagulants: The patient has                            taken no previous anticoagulant or antiplatelet                            agents. ASA Grade Assessment: II - A patient with                            mild systemic disease. After reviewing the risks                            and benefits, the patient was deemed in                            satisfactory condition to undergo the procedure.  After obtaining informed consent, the colonoscope                            was passed under direct vision. Throughout the                            procedure, the patient's blood pressure, pulse, and                            oxygen saturations were monitored continuously. The                            Colonoscope was introduced through the anus and                            advanced to the the cecum, identified by                  appendiceal orifice and ileocecal valve. The                            colonoscopy was performed without difficulty. The                            patient tolerated the procedure well. The quality                            of the bowel preparation was fair. The ileocecal                            valve, appendiceal orifice, and rectum were                            photographed. Scope In: 3:18:39 PM Scope Out: 3:34:44 PM Scope Withdrawal Time: 0 hours 7 minutes 49 seconds  Total Procedure Duration: 0 hours 16 minutes 5 seconds  Findings:                 The perianal and digital rectal examinations were                            normal.                           A moderate amount of stool was found in the sigmoid                            colon, in the descending colon, in the ascending                            colon and in the cecum, interfering with                            visualization.  The colon (entire examined portion) appeared                            normal. Biopsies for histology were taken with a                            cold forceps from the right colon and left colon                            for evaluation of microscopic colitis. Complications:            No immediate complications. Estimated Blood Loss:     Estimated blood loss was minimal. Impression:               - Stool in the sigmoid colon, in the descending                            colon, in the ascending colon and in the cecum.                           - The entire examined colon is normal. Biopsied. Recommendation:           - Patient has a contact number available for                            emergencies. The signs and symptoms of potential                            delayed complications were discussed with the                            patient. Return to normal activities tomorrow.                            Written discharge instructions were  provided to the                            patient.                           - Resume previous diet.                           - Continue present medications.                           - Await pathology results.                           - Repeat colonoscopy date to be determined after                            pending pathology results are reviewed for                            surveillance based on pathology  results.                           - Return to GI clinic at the next available                            appointment. Mauri Pole, MD 12/20/2019 3:57:07 PM This report has been signed electronically.

## 2019-12-20 NOTE — Progress Notes (Signed)
Called to room to assist during endoscopic procedure.  Patient ID and intended procedure confirmed with present staff. Received instructions for my participation in the procedure from the performing physician.  

## 2019-12-20 NOTE — Progress Notes (Signed)
Report to PACU, RN, vss, BBS= Clear.  

## 2019-12-22 ENCOUNTER — Telehealth: Payer: Self-pay | Admitting: *Deleted

## 2019-12-22 NOTE — Telephone Encounter (Signed)
  Follow up Call-  Call back number 12/20/2019  Post procedure Call Back phone  # [phone]  Permission to leave phone message Yes  Some recent data might be hidden     1. Patient questions:Have you developed a fever since your procedure? no  2.   Have you had an respiratory symptoms (SOB or cough) since your procedure? no  3.   Have you tested positive for COVID 19 since your procedure no  4.   Have you had any family members/close contacts diagnosed with the COVID 19 since your procedure?  no   If yes to any of these questions please route to Joylene John, RN and Erenest Rasher, RN  Do you have a fever, pain , or abdominal swelling? No. Pain Score  0 *  Have you tolerated food without any problems? Yes.    Have you been able to return to your normal activities? Yes.    Do you have any questions about your discharge instructions: Diet   No. Medications  No. Follow up visit  No.  Do you have questions or concerns about your Care? No.  Actions: * If pain score is 4 or above: No action needed, pain <4.

## 2019-12-28 ENCOUNTER — Other Ambulatory Visit: Payer: Self-pay

## 2019-12-28 MED ORDER — BUDESONIDE 3 MG PO CPEP: 9.0000 mg | ORAL_CAPSULE | Freq: Every day | ORAL | 2 refills | Status: DC

## 2020-01-20 ENCOUNTER — Other Ambulatory Visit: Payer: Self-pay

## 2020-01-20 ENCOUNTER — Encounter: Payer: Self-pay | Admitting: Registered Nurse

## 2020-01-20 ENCOUNTER — Encounter: Payer: Medicare Other | Attending: Physical Medicine and Rehabilitation | Admitting: Registered Nurse

## 2020-01-20 VITALS — BP 147/76 | HR 72 | Temp 97.5°F | Ht 67.0 in | Wt 224.0 lb

## 2020-01-20 DIAGNOSIS — Z5181 Encounter for therapeutic drug level monitoring: Secondary | ICD-10-CM | POA: Diagnosis not present

## 2020-01-20 DIAGNOSIS — Z79891 Long term (current) use of opiate analgesic: Secondary | ICD-10-CM | POA: Diagnosis not present

## 2020-01-20 DIAGNOSIS — G039 Meningitis, unspecified: Secondary | ICD-10-CM | POA: Insufficient documentation

## 2020-01-20 DIAGNOSIS — M5136 Other intervertebral disc degeneration, lumbar region: Secondary | ICD-10-CM | POA: Diagnosis not present

## 2020-01-20 DIAGNOSIS — G834 Cauda equina syndrome: Secondary | ICD-10-CM | POA: Diagnosis not present

## 2020-01-20 DIAGNOSIS — F411 Generalized anxiety disorder: Secondary | ICD-10-CM

## 2020-01-20 DIAGNOSIS — M5416 Radiculopathy, lumbar region: Secondary | ICD-10-CM | POA: Diagnosis not present

## 2020-01-20 DIAGNOSIS — M7062 Trochanteric bursitis, left hip: Secondary | ICD-10-CM

## 2020-01-20 DIAGNOSIS — G894 Chronic pain syndrome: Secondary | ICD-10-CM | POA: Diagnosis not present

## 2020-01-20 DIAGNOSIS — M961 Postlaminectomy syndrome, not elsewhere classified: Secondary | ICD-10-CM

## 2020-01-20 DIAGNOSIS — M7061 Trochanteric bursitis, right hip: Secondary | ICD-10-CM

## 2020-01-20 DIAGNOSIS — M51369 Other intervertebral disc degeneration, lumbar region without mention of lumbar back pain or lower extremity pain: Secondary | ICD-10-CM

## 2020-01-20 DIAGNOSIS — Z79899 Other long term (current) drug therapy: Secondary | ICD-10-CM | POA: Diagnosis not present

## 2020-01-20 DIAGNOSIS — F3289 Other specified depressive episodes: Secondary | ICD-10-CM

## 2020-01-20 MED ORDER — ALPRAZOLAM 0.25 MG PO TABS: ORAL_TABLET | ORAL | 2 refills | Status: DC

## 2020-01-20 MED ORDER — MORPHINE SULFATE ER 30 MG PO TBCR: 30.0000 mg | EXTENDED_RELEASE_TABLET | Freq: Two times a day (BID) | ORAL | 0 refills | Status: DC

## 2020-01-20 MED ORDER — OXYCODONE-ACETAMINOPHEN 10-325 MG PO TABS: 1.0000 | ORAL_TABLET | Freq: Four times a day (QID) | ORAL | 0 refills | Status: DC | PRN

## 2020-01-20 NOTE — Progress Notes (Signed)
Subjective:    Patient ID: Sherry Wong, female    DOB: 12-Oct-1955, 64 y.o.   MRN: [id number]  HPI: Sherry Wong is a 64 y.o. female who returns for follow up appointment for chronic pain and medication refill. She states her pain is located in her lower back radiating into her bilateral lower extremities and bilateral feet. Also reports bilateral hip pain. She rates her pain 5. Her current exercise regime is walking and performing stretching exercises.  Sherry Wong Morphine equivalent is 120.00  MME. She is also prescribed Alprazolam. We have discussed the black box warning of using opioids and benzodiazepines. I highlighted the dangers of using these drugs together and discussed the adverse events including respiratory suppression, overdose, cognitive impairment and importance of compliance with current regimen. We will continue to monitor and adjust as indicated.   Sherry Wong reports she was walking in her yard on Saturday 01/15/2020, she lost her balanced and landed on her buttock, she was able to pick herself up. She didn't seek medical attention. Educated on falls prevention, she verbalizes understanding.    Last UDS was Performed on 11/22/2019, it was consistent.    Pain Inventory Average Pain 7 Pain Right Now 5 My pain is sharp, burning, dull, stabbing, tingling and aching  In the last 24 hours, has pain interfered with the following? General activity 9 Relation with others 9 Enjoyment of life 10 What TIME of day is your pain at its worst? all Sleep (in general) Fair  Pain is worse with: walking, bending, sitting, standing and some activites Pain improves with: rest, heat/ice, pacing activities and medication Relief from Meds: 5  Mobility walk with assistance use a cane how many minutes can you walk? 10-15 ability to climb steps?  yes do you drive?  yes Do you have any goals in this area?  yes  Function disabled: date disabled . I need assistance with the  following:  meal prep, household duties and shopping Do you have any goals in this area?  yes  Neuro/Psych bladder control problems bowel control problems weakness numbness tremor tingling trouble walking spasms dizziness depression anxiety  Prior Studies Any changes since last visit?  no  Physicians involved in your care Any changes since last visit?  no   Family History  Problem Relation Age of Onset  . Anesthesia problems Mother   . COPD Mother   . Diabetes Mother   . Anesthesia problems Sister   . Hypotension Neg Hx   . Malignant hyperthermia Neg Hx   . Pseudochol deficiency Neg Hx   . Colon cancer Neg Hx   . Esophageal cancer Neg Hx   . Liver disease Neg Hx   . Pancreatic cancer Neg Hx    Social History   Socioeconomic History  . Marital status: Married    Spouse name: Not on file  . Number of children: 0  . Years of education: 19  . Highest education level: Not on file  Occupational History  . Occupation: Disabled  Tobacco Use  . Smoking status: Never Smoker  . Smokeless tobacco: Never Used  Substance and Sexual Activity  . Alcohol use: Yes    Alcohol/week: 0.0 standard drinks    Comment: occ- 0.5 can beer every 2 months  . Drug use: No  . Sexual activity: Not Currently  Other Topics Concern  . Not on file  Social History Narrative   Lives at home with husband.   Caffeine use: 1 cup coffee  3 times per week.    Social Determinants of Health   Financial Resource Strain:   . Difficulty of Paying Living Expenses:   Food Insecurity:   . Worried About Charity fundraiser in the Last Year:   . Arboriculturist in the Last Year:   Transportation Needs:   . Film/video editor (Medical):   [name] Lack of Transportation (Non-Medical):   Physical Activity:   . Days of Exercise per Week:   . Minutes of Exercise per Session:   Stress:   . Feeling of Stress :   Social Connections:   . Frequency of Communication with Friends and Family:   . Frequency  of Social Gatherings with Friends and Family:   . Attends Religious Services:   . Active Member of Clubs or Organizations:   . Attends Archivist Meetings:   [name] Marital Status:    Past Surgical History:  Procedure Laterality Date  . BREAST BIOPSY     right  . BREAST LUMPECTOMY Left 2014  . CHOLECYSTECTOMY     "gangrene in small intestines clipped by surgeon" per patient  . COLONOSCOPY  2009   Dr Rowe Pavy   . cysts removed from ovary    . DILATION AND CURETTAGE OF UTERUS    . ELBOW SURGERY Left   . ESOPHAGOGASTRODUODENOSCOPY    . HEMATOMA EVACUATION N/A 08/25/2013   Procedure: EVACUATION OF LUMBAR HEMATOMA;  Surgeon: Floyce Stakes, MD;  Location: [facility];  Service: Neurosurgery;  Laterality: N/A;  . LUMBAR LAMINECTOMY/DECOMPRESSION MICRODISCECTOMY  12/03/2011   Procedure: LUMBAR LAMINECTOMY/DECOMPRESSION MICRODISCECTOMY 2 LEVELS;  Surgeon: Floyce Stakes, MD;  Location: [place] [facility];  Service: Neurosurgery;  Laterality: Right;  Right Lumbar four-five Lumbar five sacral one Foraminotomies  . LUMBAR WOUND DEBRIDEMENT N/A 09/01/2013   Procedure: LUMBAR WOUND DEBRIDEMENT;  Surgeon: Floyce Stakes, MD;  Location: [facility];  Service: Neurosurgery;  Laterality: N/A;  . PLACEMENT OF LUMBAR DRAIN N/A 09/01/2013   Procedure: PLACEMENT OF LUMBAR DRAIN;  Surgeon: Floyce Stakes, MD;  Location: [facility];  Service: Neurosurgery;  Laterality: N/A;  . TRANSFORAMINAL LUMBAR INTERBODY FUSION (TLIF) WITH PEDICLE SCREW FIXATION 2 LEVEL N/A 08/25/2013   Procedure:  Lumbar four-five, Lumbar five-Sacral one Transforaminal Lumbar Interbody Fusion ;  Surgeon: Floyce Stakes, MD;  Location: [place] [facility];  Service: Neurosurgery;  Laterality: N/A;   Past Medical History:  Diagnosis Date  . Anxiety    takes celexa daily  . Arthritis   . Asthma   . Bronchitis   . Chronic back pain    ddd/lumbago,and radiculopathy  . Depression   . Gastric ulcer   . GERD (gastroesophageal reflux  disease)    takes Protonix daily  . Gout    hx of  . H/O hiatal hernia   . Hx of colonic polyps   . Hypertension    takes Losartan daily  . Hypothyroidism    takes Levothyroxine daily  . IBS (irritable bowel syndrome)   . Nocturia   . Pneumonia    hx of 15+yrs ago  . PONV (postoperative nausea and vomiting)   . Ulcerative colitis   . Urinary incontinence    BP (!) 147/76   Pulse 72   Temp (!) 97.5 F (36.4 C)   Ht 5\' 7"  (1.702 m)   Wt 224 lb (101.6 kg)   SpO2 96%   BMI 35.08 kg/m   Opioid Risk Score:   Fall Risk  Score:  `1  Depression screen PHQ 2/9  Depression screen [facility] 2/9 03/24/2019 01/26/2019 07/20/2018 07/24/2017 01/22/2017 05/22/2016 03/20/2016  Decreased Interest 1 1 1 2 1  0 0  Down, Depressed, Hopeless 1 1 1 2 1  0 0  PHQ - 2 Score 2 2 2 4 2  0 0  Altered sleeping - - - - - - -  Tired, decreased energy - - - - - - -  Change in appetite - - - - - - -  Feeling bad or failure about yourself  - - - - - - -  Trouble concentrating - - - - - - -  Moving slowly or fidgety/restless - - - - - - -  Suicidal thoughts - - - - - - -  PHQ-9 Score - - - - - - -    Review of Systems  Constitutional: Negative.   HENT: Negative.   Eyes: Negative.   Respiratory: Negative.   Cardiovascular: Negative.   Gastrointestinal: Positive for abdominal pain, constipation, diarrhea, nausea and vomiting.  Endocrine: Negative.   Genitourinary: Positive for difficulty urinating.  Musculoskeletal: Positive for arthralgias, back pain and gait problem.       Spasms   Allergic/Immunologic: Negative.   Neurological: Positive for dizziness, weakness and numbness.       Tingling  Psychiatric/Behavioral: Positive for dysphoric mood. The patient is nervous/anxious.   All other systems reviewed and are negative.      Objective:   Physical Exam Vitals and nursing note reviewed.  Constitutional:      Appearance: Normal appearance.  Cardiovascular:     Rate and Rhythm: Normal rate and  regular rhythm.     Pulses: Normal pulses.     Heart sounds: Normal heart sounds.  Pulmonary:     Effort: Pulmonary effort is normal.     Breath sounds: Normal breath sounds.  Musculoskeletal:     Cervical back: Normal range of motion and neck supple.     Comments: Normal Muscle Bulk and Muscle Testing Reveals:  Upper Extremities: Full ROM and Muscle Strength 5/5  Lumbar Paraspinal Tenderness: L-3-L-5 Bilateral Greater Trochanteric Tenderness Lower Extremities: Full ROM and Muscle Strength 5/5 Arises from chair slowly using cane for support Narrow Based  Gait   Skin:    General: Skin is warm and dry.  Neurological:     Mental Status: She is alert and oriented to person, place, and time.  Psychiatric:        Mood and Affect: Mood normal.        Behavior: Behavior normal.           Assessment & Plan:  1. Cauda equina syndrome: Incontinence: Wearing Depends:Continue to Monitor.01/20/2020. 2. Insomnia: Continue with Nortriptyline.01/20/2020 3. DDD L4-S1 s/p diskectomy with decompression of thecal sac: She continues to have back pain.Continue current medication regimen.01/20/2020 Refilled: Percocet 10/325 mg #120--use 1 tablet every 6 hours as neededand MS Contin 30 mg one tablet every 12 hours as needed #60. Second script sent for the following month. We will continue the opioid monitoring program, this consists of regular clinic visits, examinations, urine drug screen, pill counts as well as use of [place] Controlled Substance Reporting System.. 4. Depression: Continue withCymbalta. Continue to Monitor.01/20/2020 5. Anxiety: Continue withcurrent medication regimen withXanax. Continue to Monitor.01/20/2020 6.Neuropathy:Continue:with current medication nregimen with Pamelor.11/22/2019 7. Constipation: Continue Senokot.Continue to Monitor. 01/20/2020 8.Muscle Spasm: Continue Baclofen.Continue to Monitor. 01/20/2020 9. Lumbar Radiculitis: Continue  Pamelor.Continue to Monitor. 01/20/2020 10. Fall: Educated on Falls Prevention:  She verbalizes understanding.  35minutes of face to face patient care time was spent during this visit. All questions were encouraged and answered.  F/U in 2 months

## 2020-02-04 ENCOUNTER — Encounter: Payer: Self-pay | Admitting: Gastroenterology

## 2020-02-04 ENCOUNTER — Other Ambulatory Visit: Payer: Self-pay

## 2020-02-04 ENCOUNTER — Ambulatory Visit: Payer: Medicare Other | Admitting: Gastroenterology

## 2020-02-04 VITALS — BP 140/84 | HR 72 | Temp 97.8°F | Ht 63.75 in | Wt 225.1 lb

## 2020-02-04 DIAGNOSIS — R110 Nausea: Secondary | ICD-10-CM | POA: Diagnosis not present

## 2020-02-04 DIAGNOSIS — K219 Gastro-esophageal reflux disease without esophagitis: Secondary | ICD-10-CM

## 2020-02-04 DIAGNOSIS — K52832 Lymphocytic colitis: Secondary | ICD-10-CM | POA: Diagnosis not present

## 2020-02-04 DIAGNOSIS — E739 Lactose intolerance, unspecified: Secondary | ICD-10-CM | POA: Diagnosis not present

## 2020-02-04 DIAGNOSIS — K9089 Other intestinal malabsorption: Secondary | ICD-10-CM

## 2020-02-04 MED ORDER — COLESTIPOL HCL 1 G PO TABS: 1.0000 g | ORAL_TABLET | Freq: Every day | ORAL | 3 refills | Status: DC

## 2020-02-04 MED ORDER — BUDESONIDE 3 MG PO CPEP: 3.0000 mg | ORAL_CAPSULE | Freq: Every day | ORAL | 0 refills | Status: DC

## 2020-02-04 MED ORDER — OMEPRAZOLE 40 MG PO CPDR: 40.0000 mg | DELAYED_RELEASE_CAPSULE | Freq: Every day | ORAL | 3 refills | Status: AC

## 2020-02-04 NOTE — Progress Notes (Signed)
Sherry Wong    [id number]    1955-12-04  Primary Care Physician:Skillman, Aundra Millet, PA-C  Referring Physician: Rosalee Kaufman, PA-C [name],  [name] [id number]   Chief complaint: Diarrhea, lymphocytic colitis HPI: 64 year old female with history of chronic diarrhea here for follow-up visit for lymphocytic colitis  Recent colonoscopy December 20, 2019 with biopsies suggestive of lymphocytic colitis.  She was started on budesonide 9 mg daily.  She is currently taking it as 3 mg 3 times daily.  Diarrhea somewhat better but not completely gone She continues to have intermittent diarrhea up to 5 or 6 episodes per day on a bad day but on average she is having 3 semiformed bowel movements daily.  She continues to have intermittent nausea and abdominal cramping She has cut back milk and dairy products to large extent, likes to eat ice cream once in a while  No blood in stool or melena.  GI pathogen panel negative March 2021 Normal CRP Negative for celiac disease   Outpatient Encounter Medications as of 02/04/2020  Medication Sig  . albuterol (PROVENTIL HFA;VENTOLIN HFA) 108 (90 BASE) MCG/ACT inhaler Inhale 2 puffs into the lungs 2 (two) times daily as needed.   . ALPRAZolam (XANAX) 0.25 MG tablet TAKE 1 TABLET 2 TIMES A DAY AS NEEDED FOR ANXIETY.  . baclofen (LIORESAL) 20 MG tablet TAKE 1 TABLET BY MOUTH 3 TIMES DAILY. (Patient taking differently: Prn)  . budesonide (ENTOCORT EC) 3 MG 24 hr capsule Take 3 capsules (9 mg total) by mouth daily.  . Cholecalciferol (VITAMIN D3) 125 MCG (5000 UT) CAPS Take 2 capsules by mouth daily.  . DULoxetine (CYMBALTA) 60 MG capsule Take 60 mg by mouth daily.  . hyoscyamine (LEVSIN) 0.125 MG tablet Take 0.125 mg by mouth every 4 (four) hours as needed.  [name] levothyroxine (SYNTHROID) 137 MCG tablet Take 137 mcg by mouth daily before breakfast.  . morphine (MS CONTIN) 30 MG 12 hr tablet Take 1 tablet (30 mg total) by mouth  every 12 (twelve) hours.  . nortriptyline (PAMELOR) 50 MG capsule Take 1 capsule (50 mg total) by mouth at bedtime.  [name] oxyCODONE-acetaminophen (PERCOCET) 10-325 MG tablet Take 1 tablet by mouth every 6 (six) hours as needed for pain (for severe pain). May have extra tablet on days when pain is severe.  . pantoprazole (PROTONIX) 40 MG tablet Take 40 mg by mouth daily.  . simvastatin (ZOCOR) 20 MG tablet Take 20 mg by mouth daily.  . vitamin B-12 (CYANOCOBALAMIN) 1000 MCG tablet Take 1,000 mcg by mouth daily.   No facility-administered encounter medications on file as of 02/04/2020.    Allergies as of 02/04/2020 - Review Complete 02/04/2020  Allergen Reaction Noted  . Codeine Nausea Only and Other (See Comments) 06/21/2011  . Tegretol [carbamazepine] Other (See Comments) 07/24/2017    Past Medical History:  Diagnosis Date  . Anxiety    takes celexa daily  . Arthritis   . Asthma   . Bronchitis   . Chronic back pain    ddd/lumbago,and radiculopathy  . Depression   . Gastric ulcer   . GERD (gastroesophageal reflux disease)    takes Protonix daily  . Gout    hx of  . H/O hiatal hernia   . Hx of colonic polyps   . Hypertension    takes Losartan daily  . Hypothyroidism    takes Levothyroxine daily  . IBS (irritable bowel syndrome)   .  Nocturia   . Pneumonia    hx of 15+yrs ago  . PONV (postoperative nausea and vomiting)   . Ulcerative colitis   . Urinary incontinence     Past Surgical History:  Procedure Laterality Date  . BREAST BIOPSY     right  . BREAST LUMPECTOMY Left 2014  . CHOLECYSTECTOMY     "gangrene in small intestines clipped by surgeon" per patient  . COLONOSCOPY  2009   Dr Rowe Pavy   . cysts removed from ovary    . DILATION AND CURETTAGE OF UTERUS    . ELBOW SURGERY Left   . ESOPHAGOGASTRODUODENOSCOPY    . HEMATOMA EVACUATION N/A 08/25/2013   Procedure: EVACUATION OF LUMBAR HEMATOMA;  Surgeon: Floyce Stakes, MD;  Location: [facility];  Service:  Neurosurgery;  Laterality: N/A;  . LUMBAR LAMINECTOMY/DECOMPRESSION MICRODISCECTOMY  12/03/2011   Procedure: LUMBAR LAMINECTOMY/DECOMPRESSION MICRODISCECTOMY 2 LEVELS;  Surgeon: Floyce Stakes, MD;  Location: [place] [facility];  Service: Neurosurgery;  Laterality: Right;  Right Lumbar four-five Lumbar five sacral one Foraminotomies  . LUMBAR WOUND DEBRIDEMENT N/A 09/01/2013   Procedure: LUMBAR WOUND DEBRIDEMENT;  Surgeon: Floyce Stakes, MD;  Location: [facility];  Service: Neurosurgery;  Laterality: N/A;  . PLACEMENT OF LUMBAR DRAIN N/A 09/01/2013   Procedure: PLACEMENT OF LUMBAR DRAIN;  Surgeon: Floyce Stakes, MD;  Location: [facility];  Service: Neurosurgery;  Laterality: N/A;  . TRANSFORAMINAL LUMBAR INTERBODY FUSION (TLIF) WITH PEDICLE SCREW FIXATION 2 LEVEL N/A 08/25/2013   Procedure:  Lumbar four-five, Lumbar five-Sacral one Transforaminal Lumbar Interbody Fusion ;  Surgeon: Floyce Stakes, MD;  Location: [place] [facility];  Service: Neurosurgery;  Laterality: N/A;    Family History  Problem Relation Age of Onset  . Anesthesia problems Mother   . COPD Mother   . Diabetes Mother   . Anesthesia problems Sister   . Hypotension Neg Hx   . Malignant hyperthermia Neg Hx   . Pseudochol deficiency Neg Hx   . Colon cancer Neg Hx   . Esophageal cancer Neg Hx   . Liver disease Neg Hx   . Pancreatic cancer Neg Hx     Social History   Socioeconomic History  . Marital status: Married    Spouse name: Not on file  . Number of children: 0  . Years of education: 65  . Highest education level: Not on file  Occupational History  . Occupation: Disabled  Tobacco Use  . Smoking status: Never Smoker  . Smokeless tobacco: Never Used  Substance and Sexual Activity  . Alcohol use: Yes    Alcohol/week: 0.0 standard drinks    Comment: occ- 0.5 can beer every 2 months  . Drug use: No  . Sexual activity: Not Currently  Other Topics Concern  . Not on file  Social History Narrative   Lives at  home with husband.   Caffeine use: 1 cup coffee 3 times per week.    Social Determinants of Health   Financial Resource Strain:   . Difficulty of Paying Living Expenses:   Food Insecurity:   . Worried About Charity fundraiser in the Last Year:   . Arboriculturist in the Last Year:   Transportation Needs:   . Film/video editor (Medical):   [name] Lack of Transportation (Non-Medical):   Physical Activity:   . Days of Exercise per Week:   . Minutes of Exercise per Session:   Stress:   . Feeling of Stress :  Social Connections:   . Frequency of Communication with Friends and Family:   . Frequency of Social Gatherings with Friends and Family:   . Attends Religious Services:   . Active Member of Clubs or Organizations:   . Attends Archivist Meetings:   [name] Marital Status:   Intimate Partner Violence:   . Fear of Current or Ex-Partner:   . Emotionally Abused:   [name] Physically Abused:   . Sexually Abused:       Review of systems: All other review of systems negative except as mentioned in the HPI.   Physical Exam: Vitals:   02/04/20 0914  BP: 140/84  Pulse: 72  Temp: 97.8 F (36.6 C)   Body mass index is 38.95 kg/m. Gen:      No acute distress, walks with a cane Abd:      soft, non-tender; no palpable masses, no distension Neuro: alert and oriented x 3 Psych: normal mood and affect  Data Reviewed:  Reviewed labs, radiology imaging, old records and pertinent past GI work up   Assessment and Plan/Recommendations:  64 year old very pleasant female with lumbar degenerative disc disease, chronic diarrhea, recent diagnosis of lymphocytic colitis  Lymphocytic colitis: Continue budesonide 9 mg daily for additional 2 months, followed by taper to 6 mg daily for 2 weeks and then 3 mg daily for 2 weeks Avoid NSAIDs  Possible component of bile salt induced diarrhea in addition to lymphocytic colitis given persistent symptoms.  Start Colestid 1 g daily.  Advised  patient to avoid taking it within 3 to 4 hours of other medications to prevent drug interaction  Continue lactose-free diet  Nausea and intermittent breakthrough heartburn/GERD Switch to omeprazole 40 mg daily Antireflux measures  Return in 6 to 8 weeks or sooner if needed  This visit required 40 minutes of patient care (this includes precharting, chart review, review of results, face-to-face time used for counseling as well as treatment plan and follow-up. The patient was provided an opportunity to ask questions and all were answered. The patient agreed with the plan and demonstrated an understanding of the instructions.  Damaris Hippo , MD    CC: Rosalee Kaufman, *

## 2020-02-04 NOTE — Patient Instructions (Addendum)
If you are age 65 or older, your body mass index should be between 23-30. Your Body mass index is 38.95 kg/m. If this is out of the aforementioned range listed, please consider follow up with your Primary Care Provider.  If you are age 81 or younger, your body mass index should be between 19-25. Your Body mass index is 38.95 kg/m. If this is out of the aformentioned range listed, please consider follow up with your Primary Care Provider.   Please stop your Protonix  We have sent the following medications to your pharmacy for you to pick up at your convenience:  Start Omeprazole 40 mg daily. Colestid 1 gm at lunch Budesonide 3mg  tablets- take 9 mg daily for the next 2 months- after 2 months on your regular dose you will taper according to the prescription directions. 6mg  for 2 weeks, then 3 mg for 2 weeks until finisherd   You have been given a lactose free diet and anti reflux instructions.  Follow up in 6-8 weeks. Please call to schedule

## 2020-02-21 DIAGNOSIS — E782 Mixed hyperlipidemia: Secondary | ICD-10-CM | POA: Diagnosis not present

## 2020-02-21 DIAGNOSIS — E039 Hypothyroidism, unspecified: Secondary | ICD-10-CM | POA: Diagnosis not present

## 2020-02-21 DIAGNOSIS — K219 Gastro-esophageal reflux disease without esophagitis: Secondary | ICD-10-CM | POA: Diagnosis not present

## 2020-02-21 DIAGNOSIS — E876 Hypokalemia: Secondary | ICD-10-CM | POA: Diagnosis not present

## 2020-02-21 DIAGNOSIS — I10 Essential (primary) hypertension: Secondary | ICD-10-CM | POA: Diagnosis not present

## 2020-02-24 DIAGNOSIS — R609 Edema, unspecified: Secondary | ICD-10-CM | POA: Diagnosis not present

## 2020-02-24 DIAGNOSIS — Z0000 Encounter for general adult medical examination without abnormal findings: Secondary | ICD-10-CM | POA: Diagnosis not present

## 2020-03-03 ENCOUNTER — Telehealth: Payer: Self-pay | Admitting: Gastroenterology

## 2020-03-03 NOTE — Telephone Encounter (Signed)
Pt scheduled to see Dr. Silverio Decamp 04/12/20@9 :30am. Please let pt know about appt.

## 2020-03-21 ENCOUNTER — Encounter: Payer: Self-pay | Admitting: Neurology

## 2020-03-21 ENCOUNTER — Other Ambulatory Visit: Payer: Self-pay

## 2020-03-21 ENCOUNTER — Encounter: Payer: Medicare Other | Attending: Physical Medicine and Rehabilitation | Admitting: Registered Nurse

## 2020-03-21 ENCOUNTER — Encounter: Payer: Self-pay | Admitting: Registered Nurse

## 2020-03-21 VITALS — BP 118/82 | HR 83 | Temp 97.0°F | Ht 63.75 in | Wt 230.0 lb

## 2020-03-21 DIAGNOSIS — G4700 Insomnia, unspecified: Secondary | ICD-10-CM | POA: Diagnosis not present

## 2020-03-21 DIAGNOSIS — I10 Essential (primary) hypertension: Secondary | ICD-10-CM | POA: Insufficient documentation

## 2020-03-21 DIAGNOSIS — Z833 Family history of diabetes mellitus: Secondary | ICD-10-CM | POA: Diagnosis not present

## 2020-03-21 DIAGNOSIS — M961 Postlaminectomy syndrome, not elsewhere classified: Secondary | ICD-10-CM

## 2020-03-21 DIAGNOSIS — Z825 Family history of asthma and other chronic lower respiratory diseases: Secondary | ICD-10-CM | POA: Diagnosis not present

## 2020-03-21 DIAGNOSIS — G8929 Other chronic pain: Secondary | ICD-10-CM | POA: Insufficient documentation

## 2020-03-21 DIAGNOSIS — M5136 Other intervertebral disc degeneration, lumbar region: Secondary | ICD-10-CM

## 2020-03-21 DIAGNOSIS — K589 Irritable bowel syndrome without diarrhea: Secondary | ICD-10-CM | POA: Diagnosis not present

## 2020-03-21 DIAGNOSIS — M7061 Trochanteric bursitis, right hip: Secondary | ICD-10-CM

## 2020-03-21 DIAGNOSIS — G834 Cauda equina syndrome: Secondary | ICD-10-CM | POA: Diagnosis not present

## 2020-03-21 DIAGNOSIS — M5416 Radiculopathy, lumbar region: Secondary | ICD-10-CM | POA: Diagnosis not present

## 2020-03-21 DIAGNOSIS — Z9181 History of falling: Secondary | ICD-10-CM | POA: Insufficient documentation

## 2020-03-21 DIAGNOSIS — K5900 Constipation, unspecified: Secondary | ICD-10-CM | POA: Diagnosis not present

## 2020-03-21 DIAGNOSIS — Z79891 Long term (current) use of opiate analgesic: Secondary | ICD-10-CM

## 2020-03-21 DIAGNOSIS — K219 Gastro-esophageal reflux disease without esophagitis: Secondary | ICD-10-CM | POA: Insufficient documentation

## 2020-03-21 DIAGNOSIS — Z79890 Hormone replacement therapy (postmenopausal): Secondary | ICD-10-CM | POA: Diagnosis not present

## 2020-03-21 DIAGNOSIS — M62838 Other muscle spasm: Secondary | ICD-10-CM | POA: Diagnosis not present

## 2020-03-21 DIAGNOSIS — M545 Low back pain: Secondary | ICD-10-CM | POA: Diagnosis not present

## 2020-03-21 DIAGNOSIS — F411 Generalized anxiety disorder: Secondary | ICD-10-CM

## 2020-03-21 DIAGNOSIS — F329 Major depressive disorder, single episode, unspecified: Secondary | ICD-10-CM | POA: Insufficient documentation

## 2020-03-21 DIAGNOSIS — Z86010 Personal history of colonic polyps: Secondary | ICD-10-CM | POA: Insufficient documentation

## 2020-03-21 DIAGNOSIS — Z981 Arthrodesis status: Secondary | ICD-10-CM | POA: Insufficient documentation

## 2020-03-21 DIAGNOSIS — G894 Chronic pain syndrome: Secondary | ICD-10-CM | POA: Diagnosis not present

## 2020-03-21 DIAGNOSIS — F419 Anxiety disorder, unspecified: Secondary | ICD-10-CM | POA: Diagnosis not present

## 2020-03-21 DIAGNOSIS — Y92009 Unspecified place in unspecified non-institutional (private) residence as the place of occurrence of the external cause: Secondary | ICD-10-CM

## 2020-03-21 DIAGNOSIS — M1990 Unspecified osteoarthritis, unspecified site: Secondary | ICD-10-CM | POA: Diagnosis not present

## 2020-03-21 DIAGNOSIS — Z79899 Other long term (current) drug therapy: Secondary | ICD-10-CM | POA: Insufficient documentation

## 2020-03-21 DIAGNOSIS — W19XXXA Unspecified fall, initial encounter: Secondary | ICD-10-CM

## 2020-03-21 DIAGNOSIS — Z5181 Encounter for therapeutic drug level monitoring: Secondary | ICD-10-CM

## 2020-03-21 DIAGNOSIS — M7062 Trochanteric bursitis, left hip: Secondary | ICD-10-CM

## 2020-03-21 DIAGNOSIS — R269 Unspecified abnormalities of gait and mobility: Secondary | ICD-10-CM | POA: Insufficient documentation

## 2020-03-21 DIAGNOSIS — R32 Unspecified urinary incontinence: Secondary | ICD-10-CM | POA: Diagnosis not present

## 2020-03-21 DIAGNOSIS — E039 Hypothyroidism, unspecified: Secondary | ICD-10-CM | POA: Insufficient documentation

## 2020-03-21 DIAGNOSIS — R2681 Unsteadiness on feet: Secondary | ICD-10-CM

## 2020-03-21 DIAGNOSIS — G629 Polyneuropathy, unspecified: Secondary | ICD-10-CM | POA: Diagnosis not present

## 2020-03-21 DIAGNOSIS — F3289 Other specified depressive episodes: Secondary | ICD-10-CM

## 2020-03-21 MED ORDER — OXYCODONE-ACETAMINOPHEN 10-325 MG PO TABS: 1.0000 | ORAL_TABLET | Freq: Four times a day (QID) | ORAL | 0 refills | Status: DC | PRN

## 2020-03-21 MED ORDER — MORPHINE SULFATE ER 30 MG PO TBCR: 30.0000 mg | EXTENDED_RELEASE_TABLET | Freq: Two times a day (BID) | ORAL | 0 refills | Status: DC

## 2020-03-21 NOTE — Progress Notes (Signed)
Subjective:    Patient ID: Sherry Wong, female    DOB: 1956/01/31, 64 y.o.   MRN: [id number]  HPI: Sherry Wong is a 64 y.o. female who returns for follow up appointment for chronic pain and medication refill. She states her pain is located in her lower back radiating into her bilateral hips and bilateral lower extremities. She rates her pain 6. Her  current exercise regime is walking and performing stretching exercises.  Sherry Wong reports she was sitting on her porch two weeks ago, when she began  walking she lost her balanced and fell and landed on her right side. Her sister helped her up, she didn't seek medical attention. Sherry Wong has reported frequent falls over the last few months, she states she has noticed and increase frequency of loss balance and unsteady gait, we will place a referral to Neurology, she verbalizes understanding. Sherry Wong states at the time of her fall she didn't have her cane with her. She was instructed to call office in two weeks with an update on her balance and unsteady gait, she verbalizes understanding. She was instructed to use her walker or cane at all times, she verbalizes understanding.   Sherry Wong Morphine equivalent is 120.00 MME. She  is also prescribed Alprazolam. We have discussed the black box warning of using opioids and benzodiazepines. I highlighted the dangers of using these drugs together and discussed the adverse events including respiratory suppression, overdose, cognitive impairment and importance of compliance with current regimen. We will continue to monitor and adjust as indicated.   Oral Swab ordered.   Pain Inventory Average Pain 6 Pain Right Now 6 My pain is constant, sharp, burning, stabbing, tingling and aching  In the last 24 hours, has pain interfered with the following? General activity 9 Relation with others 9 Enjoyment of life 9 What TIME of day is your pain at its worst? All the time. Sleep (in general) Fair   Pain is worse with: walking, bending, sitting, standing and some activites Pain improves with: rest, heat/ice and medication Relief from Meds: 4  Mobility walk with assistance use a cane how many minutes can you walk? 15-20 mins. ability to climb steps?  yes do you drive?  yes Do you have any goals in this area?  yes  Function disabled: date disabled 2013 I need assistance with the following:  bathing, meal prep, household duties and shopping Do you have any goals in this area?  yes  Neuro/Psych bladder control problems bowel control problems numbness tremor tingling trouble walking dizziness depression anxiety  Prior Studies Any changes since last visit?  no  Physicians involved in your care Any changes since last visit?  yes New GI doctor.   Family History  Problem Relation Age of Onset  . Anesthesia problems Mother   . COPD Mother   . Diabetes Mother   . Anesthesia problems Sister   . Hypotension Neg Hx   . Malignant hyperthermia Neg Hx   . Pseudochol deficiency Neg Hx   . Colon cancer Neg Hx   . Esophageal cancer Neg Hx   . Liver disease Neg Hx   . Pancreatic cancer Neg Hx    Social History   Socioeconomic History  . Marital status: Married    Spouse name: Not on file  . Number of children: 0  . Years of education: 20  . Highest education level: Not on file  Occupational History  . Occupation: Disabled  Tobacco Use  .  Smoking status: Never Smoker  . Smokeless tobacco: Never Used  Substance and Sexual Activity  . Alcohol use: Yes    Alcohol/week: 0.0 standard drinks    Comment: occ- 0.5 can beer every 2 months  . Drug use: No  . Sexual activity: Not Currently  Other Topics Concern  . Not on file  Social History Narrative   Lives at home with husband.   Caffeine use: 1 cup coffee 3 times per week.    Social Determinants of Health   Financial Resource Strain:   . Difficulty of Paying Living Expenses:   Food Insecurity:   . Worried  About Charity fundraiser in the Last Year:   . Arboriculturist in the Last Year:   Transportation Needs:   . Film/video editor (Medical):   [name] Lack of Transportation (Non-Medical):   Physical Activity:   . Days of Exercise per Week:   . Minutes of Exercise per Session:   Stress:   . Feeling of Stress :   Social Connections:   . Frequency of Communication with Friends and Family:   . Frequency of Social Gatherings with Friends and Family:   . Attends Religious Services:   . Active Member of Clubs or Organizations:   . Attends Archivist Meetings:   [name] Marital Status:    Past Surgical History:  Procedure Laterality Date  . BREAST BIOPSY     right  . BREAST LUMPECTOMY Left 2014  . CHOLECYSTECTOMY     "gangrene in small intestines clipped by surgeon" per patient  . COLONOSCOPY  2009   Dr Rowe Pavy   . cysts removed from ovary    . DILATION AND CURETTAGE OF UTERUS    . ELBOW SURGERY Left   . ESOPHAGOGASTRODUODENOSCOPY    . HEMATOMA EVACUATION N/A 08/25/2013   Procedure: EVACUATION OF LUMBAR HEMATOMA;  Surgeon: Floyce Stakes, MD;  Location: [facility];  Service: Neurosurgery;  Laterality: N/A;  . LUMBAR LAMINECTOMY/DECOMPRESSION MICRODISCECTOMY  12/03/2011   Procedure: LUMBAR LAMINECTOMY/DECOMPRESSION MICRODISCECTOMY 2 LEVELS;  Surgeon: Floyce Stakes, MD;  Location: [place] [facility];  Service: Neurosurgery;  Laterality: Right;  Right Lumbar four-five Lumbar five sacral one Foraminotomies  . LUMBAR WOUND DEBRIDEMENT N/A 09/01/2013   Procedure: LUMBAR WOUND DEBRIDEMENT;  Surgeon: Floyce Stakes, MD;  Location: [facility];  Service: Neurosurgery;  Laterality: N/A;  . PLACEMENT OF LUMBAR DRAIN N/A 09/01/2013   Procedure: PLACEMENT OF LUMBAR DRAIN;  Surgeon: Floyce Stakes, MD;  Location: [facility];  Service: Neurosurgery;  Laterality: N/A;  . TRANSFORAMINAL LUMBAR INTERBODY FUSION (TLIF) WITH PEDICLE SCREW FIXATION 2 LEVEL N/A 08/25/2013   Procedure:  Lumbar  four-five, Lumbar five-Sacral one Transforaminal Lumbar Interbody Fusion ;  Surgeon: Floyce Stakes, MD;  Location: [place] [facility];  Service: Neurosurgery;  Laterality: N/A;   Past Medical History:  Diagnosis Date  . Anxiety    takes celexa daily  . Arthritis   . Asthma   . Bronchitis   . Chronic back pain    ddd/lumbago,and radiculopathy  . Depression   . Gastric ulcer   . GERD (gastroesophageal reflux disease)    takes Protonix daily  . Gout    hx of  . H/O hiatal hernia   . Hx of colonic polyps   . Hypertension    takes Losartan daily  . Hypothyroidism    takes Levothyroxine daily  . IBS (irritable bowel syndrome)   . Nocturia   .  Pneumonia    hx of 15+yrs ago  . PONV (postoperative nausea and vomiting)   . Ulcerative colitis   . Urinary incontinence    BP 118/82   Pulse 83   Temp (!) 97 F (36.1 C)   Ht 5' 3.75" (1.619 m)   Wt 230 lb (104.3 kg)   SpO2 97%   BMI 39.79 kg/m   Opioid Risk Score:   Fall Risk Score:  `1  Depression screen PHQ 2/9  Depression screen [facility] 2/9 03/24/2019 01/26/2019 07/20/2018 07/24/2017 01/22/2017 05/22/2016 03/20/2016  Decreased Interest 1 1 1 2 1  0 0  Down, Depressed, Hopeless 1 1 1 2 1  0 0  PHQ - 2 Score 2 2 2 4 2  0 0  Altered sleeping - - - - - - -  Tired, decreased energy - - - - - - -  Change in appetite - - - - - - -  Feeling bad or failure about yourself  - - - - - - -  Trouble concentrating - - - - - - -  Moving slowly or fidgety/restless - - - - - - -  Suicidal thoughts - - - - - - -  PHQ-9 Score - - - - - - -   Review of Systems  Constitutional: Positive for appetite change.       Weight gain & Weight loss  HENT: Negative.   Eyes: Negative.   Respiratory: Negative.   Cardiovascular: Negative.   Gastrointestinal: Positive for abdominal pain, constipation, diarrhea and nausea.  Endocrine: Negative.   Genitourinary: Negative.        Urine retention   Musculoskeletal: Positive for back pain.  Skin: Negative.    Allergic/Immunologic: Negative.   Neurological: Positive for light-headedness.  Psychiatric/Behavioral:       Anxiety, depression       Objective:   Physical Exam Vitals and nursing note reviewed.  Constitutional:      Appearance: Normal appearance.  Cardiovascular:     Rate and Rhythm: Normal rate and regular rhythm.     Pulses: Normal pulses.     Heart sounds: Normal heart sounds.  Pulmonary:     Effort: Pulmonary effort is normal.     Breath sounds: Normal breath sounds.  Musculoskeletal:     Cervical back: Normal range of motion and neck supple.     Comments: Normal Muscle Bulk and Muscle Testing Reveals:  Upper Extremities: Full ROM and Muscle Strength 5/5  Lumbar Hypersensitivity Bilateral Greater Trochanter Tenderness Lower Extremities: Full ROM and Muscle Strength 5/5 Arises from Table Slowly using cane for support Narrow Based  Gait   Skin:    General: Skin is warm and dry.  Neurological:     Mental Status: She is alert and oriented to person, place, and time.  Psychiatric:        Mood and Affect: Mood normal.        Behavior: Behavior normal.           Assessment & Plan:  1. Cauda equina syndrome: Incontinence: Wearing Depends:Continue to Monitor.03/21/2020. 2. Insomnia: Continue with Nortriptyline.03/21/2020 3. DDD L4-S1 s/p diskectomy with decompression of thecal sac: She continues to have back pain.Continue current medication regimen.03/21/2020 Refilled: Percocet 10/325 mg #120--use 1 tablet every 6 hours as neededand MS Contin 30 mg one tablet every 12 hours as needed #60.  We will continue the opioid monitoring program, this consists of regular clinic visits, examinations, urine drug screen, pill counts as well as use of  [place] Controlled Substance Reporting System.. 4. Depression: Continue withCymbalta. Continue to Monitor.03/21/2020 5. Anxiety: Continue withcurrent medication regimen withXanax. Continue to Monitor.03/21/2020  6.Neuropathy:Continue:with current medication nregimen with Pamelor.11/22/2019 7. Constipation: Continue Senokot.Continue to Monitor. 03/21/2020 8.Muscle Spasm: Continue Baclofen.Continue to Monitor. 03/21/2020 9. Lumbar Radiculitis: Continue Pamelor.Continue to Monitor. 03/21/2020 10. Fall/ Unsteady Gait : RX: Referral to Neurology. Educated on Falls Prevention: Instructed to use her cane or walker at all times.  She verbalizes understanding.03/21/2020  20 minutes of face to face patient care time was spent during this visit. All questions were encouraged and answered.  F/U in 2 months

## 2020-03-21 NOTE — Patient Instructions (Signed)
Please send  a My Chart Message in two weeks to discuss how you are a feeling.   Or Call the Office: [phone]

## 2020-03-24 LAB — DRUG TOX ALC METAB W/CON, ORAL FLD: Alcohol Metabolite: NEGATIVE ng/mL (ref <25)

## 2020-03-24 LAB — DRUG TOX MONITOR 1 W/CONF, ORAL FLD
Amphetamines: NEGATIVE ng/mL (ref <10)
Barbiturates: NEGATIVE ng/mL (ref <10)
Benzodiazepines: NEGATIVE ng/mL (ref <0.50)
Buprenorphine: NEGATIVE ng/mL (ref <0.10)
Cocaine: NEGATIVE ng/mL (ref <5.0)
Codeine: NEGATIVE ng/mL (ref <2.5)
Dihydrocodeine: NEGATIVE ng/mL (ref <2.5)
Fentanyl: NEGATIVE ng/mL (ref <0.10)
Heroin Metabolite: NEGATIVE ng/mL (ref <1.0)
Hydrocodone: NEGATIVE ng/mL (ref <2.5)
Hydromorphone: NEGATIVE ng/mL (ref <2.5)
MARIJUANA: NEGATIVE ng/mL (ref <2.5)
MDMA: NEGATIVE ng/mL (ref <10)
Meprobamate: NEGATIVE ng/mL (ref <2.5)
Methadone: NEGATIVE ng/mL (ref <5.0)
Morphine: NEGATIVE ng/mL (ref <2.5)
Nicotine Metabolite: NEGATIVE ng/mL (ref <5.0)
Norhydrocodone: NEGATIVE ng/mL (ref <2.5)
Noroxycodone: 3.9 ng/mL — ABNORMAL HIGH (ref <2.5)
Opiates: POSITIVE ng/mL — AB (ref <2.5)
Oxycodone: 11.2 ng/mL — ABNORMAL HIGH (ref <2.5)
Oxymorphone: NEGATIVE ng/mL (ref <2.5)
Phencyclidine: NEGATIVE ng/mL (ref <10)
Tapentadol: NEGATIVE ng/mL (ref <5.0)
Tramadol: NEGATIVE ng/mL (ref <5.0)
Zolpidem: NEGATIVE ng/mL (ref <5.0)

## 2020-03-27 ENCOUNTER — Telehealth: Payer: Self-pay | Admitting: *Deleted

## 2020-03-27 NOTE — Telephone Encounter (Signed)
(  addendum) There is no Morphine present in the swab results, and there is no indication when her last dose was taken. It is present in the urine tests but has not shown to be present in the oral swabs. Recommend urine tests only.

## 2020-03-27 NOTE — Telephone Encounter (Signed)
Oral drug swab is positive for oxycodone and its metabolite but negative for Morphine.  She reported her last dose of Morphine was on same day as test. In looking back over all the oral swab results, the morphine has never shown to be present, but does show in urine tests.  Therefore going forward she should only have urine drug screens.

## 2020-04-06 DIAGNOSIS — E039 Hypothyroidism, unspecified: Secondary | ICD-10-CM | POA: Diagnosis not present

## 2020-04-12 ENCOUNTER — Ambulatory Visit: Payer: Medicare Other | Admitting: Gastroenterology

## 2020-04-25 ENCOUNTER — Other Ambulatory Visit: Payer: Self-pay | Admitting: Registered Nurse

## 2020-04-25 DIAGNOSIS — G834 Cauda equina syndrome: Secondary | ICD-10-CM

## 2020-04-25 DIAGNOSIS — M5136 Other intervertebral disc degeneration, lumbar region: Secondary | ICD-10-CM

## 2020-04-26 NOTE — Telephone Encounter (Signed)
PMP was Reviewed: MS Contin and Oxycodone e-scribed today. Placed a call to Ms. Hepburn she is aware of the above.

## 2020-04-28 DIAGNOSIS — E876 Hypokalemia: Secondary | ICD-10-CM | POA: Diagnosis not present

## 2020-04-28 DIAGNOSIS — I10 Essential (primary) hypertension: Secondary | ICD-10-CM | POA: Diagnosis not present

## 2020-04-28 DIAGNOSIS — E7849 Other hyperlipidemia: Secondary | ICD-10-CM | POA: Diagnosis not present

## 2020-04-28 DIAGNOSIS — K219 Gastro-esophageal reflux disease without esophagitis: Secondary | ICD-10-CM | POA: Diagnosis not present

## 2020-05-12 DIAGNOSIS — J45901 Unspecified asthma with (acute) exacerbation: Secondary | ICD-10-CM | POA: Diagnosis not present

## 2020-05-12 DIAGNOSIS — J209 Acute bronchitis, unspecified: Secondary | ICD-10-CM | POA: Diagnosis not present

## 2020-05-18 DIAGNOSIS — E039 Hypothyroidism, unspecified: Secondary | ICD-10-CM | POA: Diagnosis not present

## 2020-05-23 ENCOUNTER — Encounter: Payer: Medicare Other | Attending: Physical Medicine and Rehabilitation | Admitting: Registered Nurse

## 2020-05-23 ENCOUNTER — Other Ambulatory Visit: Payer: Self-pay

## 2020-05-23 VITALS — BP 120/79 | HR 78 | Temp 98.5°F | Ht 63.75 in | Wt 232.0 lb

## 2020-05-23 DIAGNOSIS — M5136 Other intervertebral disc degeneration, lumbar region: Secondary | ICD-10-CM | POA: Diagnosis not present

## 2020-05-23 DIAGNOSIS — Z981 Arthrodesis status: Secondary | ICD-10-CM | POA: Diagnosis not present

## 2020-05-23 DIAGNOSIS — M7061 Trochanteric bursitis, right hip: Secondary | ICD-10-CM | POA: Diagnosis not present

## 2020-05-23 DIAGNOSIS — Z79890 Hormone replacement therapy (postmenopausal): Secondary | ICD-10-CM | POA: Diagnosis not present

## 2020-05-23 DIAGNOSIS — G894 Chronic pain syndrome: Secondary | ICD-10-CM

## 2020-05-23 DIAGNOSIS — G834 Cauda equina syndrome: Secondary | ICD-10-CM | POA: Diagnosis not present

## 2020-05-23 DIAGNOSIS — Z825 Family history of asthma and other chronic lower respiratory diseases: Secondary | ICD-10-CM | POA: Diagnosis not present

## 2020-05-23 DIAGNOSIS — R32 Unspecified urinary incontinence: Secondary | ICD-10-CM | POA: Diagnosis not present

## 2020-05-23 DIAGNOSIS — M5416 Radiculopathy, lumbar region: Secondary | ICD-10-CM | POA: Diagnosis not present

## 2020-05-23 DIAGNOSIS — M62838 Other muscle spasm: Secondary | ICD-10-CM | POA: Insufficient documentation

## 2020-05-23 DIAGNOSIS — R269 Unspecified abnormalities of gait and mobility: Secondary | ICD-10-CM | POA: Diagnosis not present

## 2020-05-23 DIAGNOSIS — G629 Polyneuropathy, unspecified: Secondary | ICD-10-CM | POA: Insufficient documentation

## 2020-05-23 DIAGNOSIS — Z79891 Long term (current) use of opiate analgesic: Secondary | ICD-10-CM

## 2020-05-23 DIAGNOSIS — M961 Postlaminectomy syndrome, not elsewhere classified: Secondary | ICD-10-CM | POA: Diagnosis not present

## 2020-05-23 DIAGNOSIS — E039 Hypothyroidism, unspecified: Secondary | ICD-10-CM | POA: Diagnosis not present

## 2020-05-23 DIAGNOSIS — M1990 Unspecified osteoarthritis, unspecified site: Secondary | ICD-10-CM | POA: Diagnosis not present

## 2020-05-23 DIAGNOSIS — M7062 Trochanteric bursitis, left hip: Secondary | ICD-10-CM

## 2020-05-23 DIAGNOSIS — F3289 Other specified depressive episodes: Secondary | ICD-10-CM

## 2020-05-23 DIAGNOSIS — G8929 Other chronic pain: Secondary | ICD-10-CM

## 2020-05-23 DIAGNOSIS — G4700 Insomnia, unspecified: Secondary | ICD-10-CM | POA: Insufficient documentation

## 2020-05-23 DIAGNOSIS — K5900 Constipation, unspecified: Secondary | ICD-10-CM | POA: Diagnosis not present

## 2020-05-23 DIAGNOSIS — Z5181 Encounter for therapeutic drug level monitoring: Secondary | ICD-10-CM

## 2020-05-23 DIAGNOSIS — Z9181 History of falling: Secondary | ICD-10-CM | POA: Insufficient documentation

## 2020-05-23 DIAGNOSIS — K219 Gastro-esophageal reflux disease without esophagitis: Secondary | ICD-10-CM | POA: Insufficient documentation

## 2020-05-23 DIAGNOSIS — Z86010 Personal history of colonic polyps: Secondary | ICD-10-CM | POA: Diagnosis not present

## 2020-05-23 DIAGNOSIS — Z79899 Other long term (current) drug therapy: Secondary | ICD-10-CM | POA: Insufficient documentation

## 2020-05-23 DIAGNOSIS — M545 Low back pain: Secondary | ICD-10-CM | POA: Insufficient documentation

## 2020-05-23 DIAGNOSIS — K589 Irritable bowel syndrome without diarrhea: Secondary | ICD-10-CM | POA: Insufficient documentation

## 2020-05-23 DIAGNOSIS — F419 Anxiety disorder, unspecified: Secondary | ICD-10-CM | POA: Diagnosis not present

## 2020-05-23 DIAGNOSIS — Z833 Family history of diabetes mellitus: Secondary | ICD-10-CM | POA: Diagnosis not present

## 2020-05-23 DIAGNOSIS — F411 Generalized anxiety disorder: Secondary | ICD-10-CM

## 2020-05-23 DIAGNOSIS — I10 Essential (primary) hypertension: Secondary | ICD-10-CM | POA: Insufficient documentation

## 2020-05-23 DIAGNOSIS — F329 Major depressive disorder, single episode, unspecified: Secondary | ICD-10-CM | POA: Diagnosis not present

## 2020-05-23 MED ORDER — OXYCODONE-ACETAMINOPHEN 10-325 MG PO TABS
ORAL_TABLET | ORAL | 0 refills | Status: DC
Start: 2020-05-23 — End: 2020-07-21

## 2020-05-23 MED ORDER — OXYCODONE-ACETAMINOPHEN 10-325 MG PO TABS: ORAL_TABLET | ORAL | 0 refills | Status: DC

## 2020-05-23 MED ORDER — MORPHINE SULFATE ER 30 MG PO TBCR: EXTENDED_RELEASE_TABLET | ORAL | 0 refills | Status: DC

## 2020-05-23 NOTE — Progress Notes (Signed)
Subjective:    Patient ID: Sherry Wong, female    DOB: 27-Jul-1956, 64 y.o.   MRN: [id number]  HPI: Sherry Wong is a 64 y.o. female who returns for follow up appointment for chronic pain and medication refill. She states her pain is located in her lower back radiating into her bilateral lower extremities R>L and bilateral hip pain. She rates her pain 6. Her current exercise regime is walking and performing stretching exercises.  Sherry Wong Morphine equivalent is 120.00 MME.  She is also prescribed Alprazolam. We have discussed the black box warning of using opioids and benzodiazepines. I highlighted the dangers of using these drugs together and discussed the adverse events including respiratory suppression, overdose, cognitive impairment and importance of compliance with current regimen. We will continue to monitor and adjust as indicated.   Last Oral Swab was Performed on 03/21/2020, it was consistent.   Pain Inventory Average Pain 7 Pain Right Now 6 My pain is stabbing, tingling and aching  In the last 24 hours, has pain interfered with the following? General activity 9 Relation with others 9 Enjoyment of life 9 What TIME of day is your pain at its worst? morning , daytime, evening and night Sleep (in general) Fair  Pain is worse with: walking, bending, standing and some activites Pain improves with: rest, heat/ice and medication Relief from Meds: 5  Family History  Problem Relation Age of Onset  . Anesthesia problems Mother   . COPD Mother   . Diabetes Mother   . Anesthesia problems Sister   . Hypotension Neg Hx   . Malignant hyperthermia Neg Hx   . Pseudochol deficiency Neg Hx   . Colon cancer Neg Hx   . Esophageal cancer Neg Hx   . Liver disease Neg Hx   . Pancreatic cancer Neg Hx    Social History   Socioeconomic History  . Marital status: Married    Spouse name: Not on file  . Number of children: 0  . Years of education: 71  . Highest education level:  Not on file  Occupational History  . Occupation: Disabled  Tobacco Use  . Smoking status: Never Smoker  . Smokeless tobacco: Never Used  Substance and Sexual Activity  . Alcohol use: Yes    Alcohol/week: 0.0 standard drinks    Comment: occ- 0.5 can beer every 2 months  . Drug use: No  . Sexual activity: Not Currently  Other Topics Concern  . Not on file  Social History Narrative   Lives at home with husband.   Caffeine use: 1 cup coffee 3 times per week.    Social Determinants of Health   Financial Resource Strain:   . Difficulty of Paying Living Expenses: Not on file  Food Insecurity:   . Worried About Charity fundraiser in the Last Year: Not on file  . Ran Out of Food in the Last Year: Not on file  Transportation Needs:   . Lack of Transportation (Medical): Not on file  . Lack of Transportation (Non-Medical): Not on file  Physical Activity:   . Days of Exercise per Week: Not on file  . Minutes of Exercise per Session: Not on file  Stress:   . Feeling of Stress : Not on file  Social Connections:   . Frequency of Communication with Friends and Family: Not on file  . Frequency of Social Gatherings with Friends and Family: Not on file  . Attends Religious Services: Not on file  .  Active Member of Clubs or Organizations: Not on file  . Attends Archivist Meetings: Not on file  . Marital Status: Not on file   Past Surgical History:  Procedure Laterality Date  . BREAST BIOPSY     right  . BREAST LUMPECTOMY Left 2014  . CHOLECYSTECTOMY     "gangrene in small intestines clipped by surgeon" per patient  . COLONOSCOPY  2009   Dr Rowe Pavy   . cysts removed from ovary    . DILATION AND CURETTAGE OF UTERUS    . ELBOW SURGERY Left   . ESOPHAGOGASTRODUODENOSCOPY    . HEMATOMA EVACUATION N/A 08/25/2013   Procedure: EVACUATION OF LUMBAR HEMATOMA;  Surgeon: Floyce Stakes, MD;  Location: [facility];  Service: Neurosurgery;  Laterality: N/A;  . LUMBAR  LAMINECTOMY/DECOMPRESSION MICRODISCECTOMY  12/03/2011   Procedure: LUMBAR LAMINECTOMY/DECOMPRESSION MICRODISCECTOMY 2 LEVELS;  Surgeon: Floyce Stakes, MD;  Location: [place] [facility];  Service: Neurosurgery;  Laterality: Right;  Right Lumbar four-five Lumbar five sacral one Foraminotomies  . LUMBAR WOUND DEBRIDEMENT N/A 09/01/2013   Procedure: LUMBAR WOUND DEBRIDEMENT;  Surgeon: Floyce Stakes, MD;  Location: [facility];  Service: Neurosurgery;  Laterality: N/A;  . PLACEMENT OF LUMBAR DRAIN N/A 09/01/2013   Procedure: PLACEMENT OF LUMBAR DRAIN;  Surgeon: Floyce Stakes, MD;  Location: [facility];  Service: Neurosurgery;  Laterality: N/A;  . TRANSFORAMINAL LUMBAR INTERBODY FUSION (TLIF) WITH PEDICLE SCREW FIXATION 2 LEVEL N/A 08/25/2013   Procedure:  Lumbar four-five, Lumbar five-Sacral one Transforaminal Lumbar Interbody Fusion ;  Surgeon: Floyce Stakes, MD;  Location: [place] [facility];  Service: Neurosurgery;  Laterality: N/A;   Past Surgical History:  Procedure Laterality Date  . BREAST BIOPSY     right  . BREAST LUMPECTOMY Left 2014  . CHOLECYSTECTOMY     "gangrene in small intestines clipped by surgeon" per patient  . COLONOSCOPY  2009   Dr Rowe Pavy   . cysts removed from ovary    . DILATION AND CURETTAGE OF UTERUS    . ELBOW SURGERY Left   . ESOPHAGOGASTRODUODENOSCOPY    . HEMATOMA EVACUATION N/A 08/25/2013   Procedure: EVACUATION OF LUMBAR HEMATOMA;  Surgeon: Floyce Stakes, MD;  Location: [facility];  Service: Neurosurgery;  Laterality: N/A;  . LUMBAR LAMINECTOMY/DECOMPRESSION MICRODISCECTOMY  12/03/2011   Procedure: LUMBAR LAMINECTOMY/DECOMPRESSION MICRODISCECTOMY 2 LEVELS;  Surgeon: Floyce Stakes, MD;  Location: [place] [facility];  Service: Neurosurgery;  Laterality: Right;  Right Lumbar four-five Lumbar five sacral one Foraminotomies  . LUMBAR WOUND DEBRIDEMENT N/A 09/01/2013   Procedure: LUMBAR WOUND DEBRIDEMENT;  Surgeon: Floyce Stakes, MD;  Location: [facility];  Service:  Neurosurgery;  Laterality: N/A;  . PLACEMENT OF LUMBAR DRAIN N/A 09/01/2013   Procedure: PLACEMENT OF LUMBAR DRAIN;  Surgeon: Floyce Stakes, MD;  Location: [facility];  Service: Neurosurgery;  Laterality: N/A;  . TRANSFORAMINAL LUMBAR INTERBODY FUSION (TLIF) WITH PEDICLE SCREW FIXATION 2 LEVEL N/A 08/25/2013   Procedure:  Lumbar four-five, Lumbar five-Sacral one Transforaminal Lumbar Interbody Fusion ;  Surgeon: Floyce Stakes, MD;  Location: [place] [facility];  Service: Neurosurgery;  Laterality: N/A;   Past Medical History:  Diagnosis Date  . Anxiety    takes celexa daily  . Arthritis   . Asthma   . Bronchitis   . Chronic back pain    ddd/lumbago,and radiculopathy  . Depression   . Gastric ulcer   . GERD (gastroesophageal reflux disease)    takes  Protonix daily  . Gout    hx of  . H/O hiatal hernia   . Hx of colonic polyps   . Hypertension    takes Losartan daily  . Hypothyroidism    takes Levothyroxine daily  . IBS (irritable bowel syndrome)   . Nocturia   . Pneumonia    hx of 15+yrs ago  . PONV (postoperative nausea and vomiting)   . Ulcerative colitis   . Urinary incontinence    BP 120/79   Pulse 78   Temp 98.5 F (36.9 C)   Ht 5' 3.75" (1.619 m)   Wt 232 lb (105.2 kg)   SpO2 96%   BMI 40.14 kg/m   Opioid Risk Score:   Fall Risk Score:  `1  Depression screen PHQ 2/9  Depression screen [facility] 2/9 05/23/2020 03/21/2020 03/24/2019 01/26/2019 07/20/2018 07/24/2017 01/22/2017  Decreased Interest 1 0 1 1 1 2 1   Down, Depressed, Hopeless 1 0 1 1 1 2 1   PHQ - 2 Score 2 0 2 2 2 4 2   Altered sleeping - 0 - - - - -  Tired, decreased energy - 0 - - - - -  Change in appetite - 0 - - - - -  Feeling bad or failure about yourself  - 0 - - - - -  Trouble concentrating - 0 - - - - -  Moving slowly or fidgety/restless - 0 - - - - -  Suicidal thoughts - 0 - - - - -  PHQ-9 Score - 0 - - - - -    Review of Systems  Musculoskeletal: Positive for back pain and gait problem.    Neurological: Positive for tremors, weakness and numbness.       Tingling  Psychiatric/Behavioral: Positive for dysphoric mood. The patient is nervous/anxious.   All other systems reviewed and are negative.      Objective:   Physical Exam Vitals and nursing note reviewed.  Constitutional:      Appearance: Normal appearance.  Cardiovascular:     Rate and Rhythm: Normal rate and regular rhythm.     Pulses: Normal pulses.     Heart sounds: Normal heart sounds.  Pulmonary:     Effort: Pulmonary effort is normal.     Breath sounds: Normal breath sounds.  Musculoskeletal:     Cervical back: Normal range of motion and neck supple.     Comments: Normal Muscle Bulk and Muscle Testing Reveals: Upper Extremities: Full ROM and Muscle Strength 5/5 Lumbar Hypersensitivity Bilateral Greater Trochanteric Tenderness Lower Extremities: Full ROM and Muscle Strength 5/5 Bilateral Lower Extremity Flexion Produces Pain into her Bilateral Lower Extremities Arises from Chair Slowly using cane for support Antalgic  Gait   Skin:    General: Skin is warm and dry.  Neurological:     Mental Status: She is alert and oriented to person, place, and time.  Psychiatric:        Mood and Affect: Mood normal.        Behavior: Behavior normal.           Assessment & Plan:  1. Cauda equina syndrome: Incontinence: Wearing Depends:Continue to Monitor.05/23/2020. 2. Insomnia: Continue with Nortriptyline.05/23/2020 3. DDD L4-S1 s/p diskectomy with decompression of thecal sac: She continues to have back pain.Continue current medication regimen.05/23/2020 Refilled: Percocet 10/325 mg #120--use 1 tablet every 6 hours as neededand MS Contin 30 mg one tablet every 12 hours as needed #60.  We will continue the opioid monitoring program, this  consists of regular clinic visits, examinations, urine drug screen, pill counts as well as use of [place] Controlled Substance Reporting system. A 12 month History  has been reviewed on the [facility] on 05/23/2020. 4. Depression: Continue withCymbalta. Continue to Monitor.05/23/2020 5. Anxiety: Continue withcurrent medication regimen withXanax. Continue to Monitor.05/23/2020 6.Neuropathy:Continue:with current medication nregimen with Pamelor.05/23/2020 7. Constipation: Continue Senokot.Continue to Monitor.05/23/2020 8.Muscle Spasm: Continue Baclofen.Continue to Monitor.05/23/2020 9. Lumbar Radiculitis: Continue Pamelor.Continue to Monitor.05/23/2020 10. Fall/ Unsteady Gait: No Falls since last visit: She has an appointment with Neurology on 06/15/2020. We reviewed Falls Prevention Again: Instructed to use her cane or walker at all times.  She verbalizes understanding.05/23/2020 11. Acute exacerbation of chronic low back since her  Fall  last month: Refuses X-rays, she will wait till she see the neurologist next month she states.   20 minutes of face to face patient care time was spent during this visit. All questions were encouraged and answered.  F/U in 2 months

## 2020-05-27 ENCOUNTER — Encounter: Payer: Self-pay | Admitting: Registered Nurse

## 2020-06-15 ENCOUNTER — Ambulatory Visit: Payer: Medicare Other | Admitting: Neurology

## 2020-06-21 ENCOUNTER — Ambulatory Visit: Payer: Medicare Other | Admitting: Gastroenterology

## 2020-06-22 DIAGNOSIS — K219 Gastro-esophageal reflux disease without esophagitis: Secondary | ICD-10-CM | POA: Diagnosis not present

## 2020-06-22 DIAGNOSIS — E782 Mixed hyperlipidemia: Secondary | ICD-10-CM | POA: Diagnosis not present

## 2020-06-22 DIAGNOSIS — E039 Hypothyroidism, unspecified: Secondary | ICD-10-CM | POA: Diagnosis not present

## 2020-06-22 DIAGNOSIS — I10 Essential (primary) hypertension: Secondary | ICD-10-CM | POA: Diagnosis not present

## 2020-06-22 DIAGNOSIS — R7989 Other specified abnormal findings of blood chemistry: Secondary | ICD-10-CM | POA: Diagnosis not present

## 2020-06-26 ENCOUNTER — Other Ambulatory Visit: Payer: Self-pay | Admitting: Registered Nurse

## 2020-06-27 DIAGNOSIS — E039 Hypothyroidism, unspecified: Secondary | ICD-10-CM | POA: Diagnosis not present

## 2020-06-27 DIAGNOSIS — K219 Gastro-esophageal reflux disease without esophagitis: Secondary | ICD-10-CM | POA: Diagnosis not present

## 2020-06-27 DIAGNOSIS — R609 Edema, unspecified: Secondary | ICD-10-CM | POA: Diagnosis not present

## 2020-06-27 DIAGNOSIS — E782 Mixed hyperlipidemia: Secondary | ICD-10-CM | POA: Diagnosis not present

## 2020-07-12 ENCOUNTER — Other Ambulatory Visit: Payer: Self-pay | Admitting: Registered Nurse

## 2020-07-21 ENCOUNTER — Other Ambulatory Visit: Payer: Self-pay

## 2020-07-21 ENCOUNTER — Encounter: Payer: Self-pay | Admitting: Registered Nurse

## 2020-07-21 ENCOUNTER — Encounter: Payer: Medicare Other | Attending: Physical Medicine and Rehabilitation | Admitting: Registered Nurse

## 2020-07-21 VITALS — BP 122/84 | HR 79 | Temp 98.2°F | Ht 63.75 in | Wt 232.6 lb

## 2020-07-21 DIAGNOSIS — M7062 Trochanteric bursitis, left hip: Secondary | ICD-10-CM | POA: Diagnosis not present

## 2020-07-21 DIAGNOSIS — Z79891 Long term (current) use of opiate analgesic: Secondary | ICD-10-CM | POA: Diagnosis not present

## 2020-07-21 DIAGNOSIS — Z5181 Encounter for therapeutic drug level monitoring: Secondary | ICD-10-CM

## 2020-07-21 DIAGNOSIS — M51369 Other intervertebral disc degeneration, lumbar region without mention of lumbar back pain or lower extremity pain: Secondary | ICD-10-CM

## 2020-07-21 DIAGNOSIS — G894 Chronic pain syndrome: Secondary | ICD-10-CM | POA: Diagnosis not present

## 2020-07-21 DIAGNOSIS — M7061 Trochanteric bursitis, right hip: Secondary | ICD-10-CM

## 2020-07-21 DIAGNOSIS — G834 Cauda equina syndrome: Secondary | ICD-10-CM

## 2020-07-21 DIAGNOSIS — F3289 Other specified depressive episodes: Secondary | ICD-10-CM | POA: Diagnosis present

## 2020-07-21 DIAGNOSIS — F411 Generalized anxiety disorder: Secondary | ICD-10-CM | POA: Diagnosis present

## 2020-07-21 DIAGNOSIS — M5416 Radiculopathy, lumbar region: Secondary | ICD-10-CM

## 2020-07-21 DIAGNOSIS — M961 Postlaminectomy syndrome, not elsewhere classified: Secondary | ICD-10-CM

## 2020-07-21 DIAGNOSIS — M5136 Other intervertebral disc degeneration, lumbar region: Secondary | ICD-10-CM | POA: Insufficient documentation

## 2020-07-21 MED ORDER — MORPHINE SULFATE ER 30 MG PO TBCR: EXTENDED_RELEASE_TABLET | ORAL | 0 refills | Status: DC

## 2020-07-21 MED ORDER — OXYCODONE-ACETAMINOPHEN 10-325 MG PO TABS: ORAL_TABLET | ORAL | 0 refills | Status: DC

## 2020-07-21 MED ORDER — MORPHINE SULFATE ER 30 MG PO TBCR
EXTENDED_RELEASE_TABLET | ORAL | 0 refills | Status: DC
Start: 2020-07-21 — End: 2020-07-21

## 2020-07-21 NOTE — Progress Notes (Signed)
Subjective:    Patient ID: Sherry Wong, female    DOB: Nov 05, 1955, 64 y.o.   MRN: [id number]  HPI: Sherry Wong is a 64 y.o. female who returns for follow up appointment for chronic pain and medication refill. She states her pain is located in her lower back radiating into her buttocks, bilateral lower extremities and bilateral feet. Also reports bilateral hip pain.She rates her pain 6. Her current exercise regime is walking and performing stretching exercises with bands.  Ms. Brazie Morphine equivalent is 120.00 MME. She  is also prescribed Alprazolam. We have discussed the black box warning of using opioids and benzodiazepines. I highlighted the dangers of using these drugs together and discussed the adverse events including respiratory suppression, overdose, cognitive impairment and importance of compliance with current regimen. We will continue to monitor and adjust as indicated.   Last Oral Swab was Performed on 03/21/2020, it was consistent.     Pain Inventory Average Pain 7 Pain Right Now 6 My pain is sharp, burning, dull, stabbing and aching  In the last 24 hours, has pain interfered with the following? General activity 9 Relation with others 9 Enjoyment of life 9 What TIME of day is your pain at its worst? morning , daytime, evening and night Sleep (in general) Fair  Pain is worse with: walking, bending, sitting, standing and some activites Pain improves with: rest, heat/ice and medication Relief from Meds: 4  Family History  Problem Relation Age of Onset  . Anesthesia problems Mother   . COPD Mother   . Diabetes Mother   . Anesthesia problems Sister   . Hypotension Neg Hx   . Malignant hyperthermia Neg Hx   . Pseudochol deficiency Neg Hx   . Colon cancer Neg Hx   . Esophageal cancer Neg Hx   . Liver disease Neg Hx   . Pancreatic cancer Neg Hx    Social History   Socioeconomic History  . Marital status: Married    Spouse name: Not on file  . Number of  children: 0  . Years of education: 64  . Highest education level: Not on file  Occupational History  . Occupation: Disabled  Tobacco Use  . Smoking status: Never Smoker  . Smokeless tobacco: Never Used  Substance and Sexual Activity  . Alcohol use: Yes    Alcohol/week: 0.0 standard drinks    Comment: occ- 0.5 can beer every 2 months  . Drug use: No  . Sexual activity: Not Currently  Other Topics Concern  . Not on file  Social History Narrative   Lives at home with husband.   Caffeine use: 1 cup coffee 3 times per week.    Social Determinants of Health   Financial Resource Strain:   . Difficulty of Paying Living Expenses: Not on file  Food Insecurity:   . Worried About Charity fundraiser in the Last Year: Not on file  . Ran Out of Food in the Last Year: Not on file  Transportation Needs:   . Lack of Transportation (Medical): Not on file  . Lack of Transportation (Non-Medical): Not on file  Physical Activity:   . Days of Exercise per Week: Not on file  . Minutes of Exercise per Session: Not on file  Stress:   . Feeling of Stress : Not on file  Social Connections:   . Frequency of Communication with Friends and Family: Not on file  . Frequency of Social Gatherings with Friends and Family: Not  on file  . Attends Religious Services: Not on file  . Active Member of Clubs or Organizations: Not on file  . Attends Archivist Meetings: Not on file  . Marital Status: Not on file   Past Surgical History:  Procedure Laterality Date  . BREAST BIOPSY     right  . BREAST LUMPECTOMY Left 2014  . CHOLECYSTECTOMY     "gangrene in small intestines clipped by surgeon" per patient  . COLONOSCOPY  2009   Dr Rowe Pavy   . cysts removed from ovary    . DILATION AND CURETTAGE OF UTERUS    . ELBOW SURGERY Left   . ESOPHAGOGASTRODUODENOSCOPY    . HEMATOMA EVACUATION N/A 08/25/2013   Procedure: EVACUATION OF LUMBAR HEMATOMA;  Surgeon: Floyce Stakes, MD;  Location: [facility];   Service: Neurosurgery;  Laterality: N/A;  . LUMBAR LAMINECTOMY/DECOMPRESSION MICRODISCECTOMY  12/03/2011   Procedure: LUMBAR LAMINECTOMY/DECOMPRESSION MICRODISCECTOMY 2 LEVELS;  Surgeon: Floyce Stakes, MD;  Location: [place] [facility];  Service: Neurosurgery;  Laterality: Right;  Right Lumbar four-five Lumbar five sacral one Foraminotomies  . LUMBAR WOUND DEBRIDEMENT N/A 09/01/2013   Procedure: LUMBAR WOUND DEBRIDEMENT;  Surgeon: Floyce Stakes, MD;  Location: [facility];  Service: Neurosurgery;  Laterality: N/A;  . PLACEMENT OF LUMBAR DRAIN N/A 09/01/2013   Procedure: PLACEMENT OF LUMBAR DRAIN;  Surgeon: Floyce Stakes, MD;  Location: [facility];  Service: Neurosurgery;  Laterality: N/A;  . TRANSFORAMINAL LUMBAR INTERBODY FUSION (TLIF) WITH PEDICLE SCREW FIXATION 2 LEVEL N/A 08/25/2013   Procedure:  Lumbar four-five, Lumbar five-Sacral one Transforaminal Lumbar Interbody Fusion ;  Surgeon: Floyce Stakes, MD;  Location: [place] [facility];  Service: Neurosurgery;  Laterality: N/A;   Past Surgical History:  Procedure Laterality Date  . BREAST BIOPSY     right  . BREAST LUMPECTOMY Left 2014  . CHOLECYSTECTOMY     "gangrene in small intestines clipped by surgeon" per patient  . COLONOSCOPY  2009   Dr Rowe Pavy   . cysts removed from ovary    . DILATION AND CURETTAGE OF UTERUS    . ELBOW SURGERY Left   . ESOPHAGOGASTRODUODENOSCOPY    . HEMATOMA EVACUATION N/A 08/25/2013   Procedure: EVACUATION OF LUMBAR HEMATOMA;  Surgeon: Floyce Stakes, MD;  Location: [facility];  Service: Neurosurgery;  Laterality: N/A;  . LUMBAR LAMINECTOMY/DECOMPRESSION MICRODISCECTOMY  12/03/2011   Procedure: LUMBAR LAMINECTOMY/DECOMPRESSION MICRODISCECTOMY 2 LEVELS;  Surgeon: Floyce Stakes, MD;  Location: [place] [facility];  Service: Neurosurgery;  Laterality: Right;  Right Lumbar four-five Lumbar five sacral one Foraminotomies  . LUMBAR WOUND DEBRIDEMENT N/A 09/01/2013   Procedure: LUMBAR WOUND DEBRIDEMENT;  Surgeon:  Floyce Stakes, MD;  Location: [facility];  Service: Neurosurgery;  Laterality: N/A;  . PLACEMENT OF LUMBAR DRAIN N/A 09/01/2013   Procedure: PLACEMENT OF LUMBAR DRAIN;  Surgeon: Floyce Stakes, MD;  Location: [facility];  Service: Neurosurgery;  Laterality: N/A;  . TRANSFORAMINAL LUMBAR INTERBODY FUSION (TLIF) WITH PEDICLE SCREW FIXATION 2 LEVEL N/A 08/25/2013   Procedure:  Lumbar four-five, Lumbar five-Sacral one Transforaminal Lumbar Interbody Fusion ;  Surgeon: Floyce Stakes, MD;  Location: [place] [facility];  Service: Neurosurgery;  Laterality: N/A;   Past Medical History:  Diagnosis Date  . Anxiety    takes celexa daily  . Arthritis   . Asthma   . Bronchitis   . Chronic back pain    ddd/lumbago,and radiculopathy  . Depression   . Gastric  ulcer   . GERD (gastroesophageal reflux disease)    takes Protonix daily  . Gout    hx of  . H/O hiatal hernia   . Hx of colonic polyps   . Hypertension    takes Losartan daily  . Hypothyroidism    takes Levothyroxine daily  . IBS (irritable bowel syndrome)   . Nocturia   . Pneumonia    hx of 15+yrs ago  . PONV (postoperative nausea and vomiting)   . Ulcerative colitis   . Urinary incontinence    BP 122/84   Pulse 79   Temp 98.2 F (36.8 C)   Ht 5' 3.75" (1.619 m)   Wt 232 lb 9.6 oz (105.5 kg)   SpO2 97%   BMI 40.24 kg/m   Opioid Risk Score:   Fall Risk Score:  `1  Depression screen PHQ 2/9  Depression screen [facility] 2/9 05/23/2020 03/21/2020 03/24/2019 01/26/2019 07/20/2018 07/24/2017 01/22/2017  Decreased Interest 1 0 1 1 1 2 1   Down, Depressed, Hopeless 1 0 1 1 1 2 1   PHQ - 2 Score 2 0 2 2 2 4 2   Altered sleeping - 0 - - - - -  Tired, decreased energy - 0 - - - - -  Change in appetite - 0 - - - - -  Feeling bad or failure about yourself  - 0 - - - - -  Trouble concentrating - 0 - - - - -  Moving slowly or fidgety/restless - 0 - - - - -  Suicidal thoughts - 0 - - - - -  PHQ-9 Score - 0 - - - - -    Review of Systems   Musculoskeletal: Positive for back pain and gait problem.       Leg and feet pain  Neurological: Positive for tremors, weakness and numbness.       Tingling   All other systems reviewed and are negative.      Objective:   Physical Exam Vitals and nursing note reviewed.  Constitutional:      Appearance: Normal appearance.  Cardiovascular:     Rate and Rhythm: Normal rate and regular rhythm.     Pulses: Normal pulses.     Heart sounds: Normal heart sounds.  Pulmonary:     Effort: Pulmonary effort is normal.     Breath sounds: Normal breath sounds.  Musculoskeletal:     Cervical back: Normal range of motion and neck supple.     Comments: Normal Muscle Bulk and Muscle Testing Reveals:  Upper Extremities: Full ROM and Muscle Strength 5/5 Lumbar Paraspinal Tenderness: L-3-L-5 Bilateral Greater Trochanter Tenderness Lower Extremities : Full ROM and Muscle Strength 5/5 Arises from chair slowly using cane for support Antalgic Gait   Skin:    General: Skin is warm and dry.  Neurological:     Mental Status: She is alert and oriented to person, place, and time.  Psychiatric:        Mood and Affect: Mood normal.        Behavior: Behavior normal.           Assessment & Plan:  1. Cauda equina syndrome: Incontinence: Wearing Depends:Continue to Monitor.07/21/2020. 2. Insomnia: Continue with Nortriptyline.07/21/2020 3. DDD L4-S1 s/p diskectomy with decompression of thecal sac: She continues to have back pain.Continue current medication regimen.07/21/2020 Refilled: Percocet 10/325 mg #120--use 1 tablet every 6 hours as neededand MS Contin 30 mg one tablet every 12 hours as needed #60.  We will continue  the opioid monitoring program, this consists of regular clinic visits, examinations, urine drug screen, pill counts as well as use of [place] Controlled Substance Reporting system. A 12 month History has been reviewed on the [place] on 07/21/2020. 4. Depression: Continue withCymbalta. Continue to Monitor.07/21/2020 5. Anxiety: Continue withcurrent medication regimen withXanax. Continue to Monitor.07/21/2020 6.Neuropathy:Continue:with current medication nregimen with Pamelor.07/21/2020 7. Constipation: Continue Senokot.Continue to Monitor.07/21/2020 8.Muscle Spasm: Continue Baclofen.Continue to Monitor.07/21/2020 9. Lumbar Radiculitis: Continue Pamelor.Continue to Monitor.010/22/2021 10. Fall/ Unsteady Gait: No Falls since last visit: She's using her  cane or walker at all times.Continue to Monitor.07/21/2020   52minutes of face to face patient care time was spent during this visit. All questions were encouraged and answered.  F/U in 2 months

## 2020-07-31 DIAGNOSIS — K5100 Ulcerative (chronic) pancolitis without complications: Secondary | ICD-10-CM

## 2020-07-31 HISTORY — DX: Ulcerative (chronic) pancolitis without complications: K51.00

## 2020-08-03 NOTE — Progress Notes (Deleted)
NEUROLOGY CONSULTATION NOTE  Sherry Wong MRN: [id number] DOB: [birth year]  Referring provider: Danella Sensing, NP Primary care provider: Clemmie Krill, PA-C  Reason for consult:  Frequent falls  HISTORY OF PRESENT ILLNESS: Sherry Wong is a 64 year old ***-handed female with chronic back pain, HTN, depression/anxiety, neuropathy, and hypothyroidism who presents for frequent falls.  History supplemented by referring provider's note.  Patient has history of cauda equina syndrome secondary to L4-S1 degenerative disc disease s/p 4 lumbar spine surgeries including diskectomy and decompression in 2014 with post-laminectomy syndrome with chronic low back pain radiating into her bilateral hips and legs.  She has neuropathy in the feet.  She typically ambulates with a cane.    She was evaluated for new onset tremor and jerking movements by neurologist, Dr. Jaynee Eagles, in 2016.  MRI of brain and EEG were unremarkable.  It may have been suspected that her symptoms were psychosomatic.    PAST MEDICAL HISTORY: Past Medical History:  Diagnosis Date  . Anxiety    takes celexa daily  . Arthritis   . Asthma   . Bronchitis   . Chronic back pain    ddd/lumbago,and radiculopathy  . Depression   . Gastric ulcer   . GERD (gastroesophageal reflux disease)    takes Protonix daily  . Gout    hx of  . H/O hiatal hernia   . Hx of colonic polyps   . Hypertension    takes Losartan daily  . Hypothyroidism    takes Levothyroxine daily  . IBS (irritable bowel syndrome)   . Nocturia   . Pneumonia    hx of 15+yrs ago  . PONV (postoperative nausea and vomiting)   . Ulcerative colitis   . Urinary incontinence     PAST SURGICAL HISTORY: Past Surgical History:  Procedure Laterality Date  . BREAST BIOPSY     right  . BREAST LUMPECTOMY Left 2014  . CHOLECYSTECTOMY     "gangrene in small intestines clipped by surgeon" per patient  . COLONOSCOPY  2009   Dr Rowe Pavy   . cysts removed from ovary     . DILATION AND CURETTAGE OF UTERUS    . ELBOW SURGERY Left   . ESOPHAGOGASTRODUODENOSCOPY    . HEMATOMA EVACUATION N/A 08/25/2013   Procedure: EVACUATION OF LUMBAR HEMATOMA;  Surgeon: Floyce Stakes, MD;  Location: [facility];  Service: Neurosurgery;  Laterality: N/A;  . LUMBAR LAMINECTOMY/DECOMPRESSION MICRODISCECTOMY  12/03/2011   Procedure: LUMBAR LAMINECTOMY/DECOMPRESSION MICRODISCECTOMY 2 LEVELS;  Surgeon: Floyce Stakes, MD;  Location: [place] [facility];  Service: Neurosurgery;  Laterality: Right;  Right Lumbar four-five Lumbar five sacral one Foraminotomies  . LUMBAR WOUND DEBRIDEMENT N/A 09/01/2013   Procedure: LUMBAR WOUND DEBRIDEMENT;  Surgeon: Floyce Stakes, MD;  Location: [facility];  Service: Neurosurgery;  Laterality: N/A;  . PLACEMENT OF LUMBAR DRAIN N/A 09/01/2013   Procedure: PLACEMENT OF LUMBAR DRAIN;  Surgeon: Floyce Stakes, MD;  Location: [facility];  Service: Neurosurgery;  Laterality: N/A;  . TRANSFORAMINAL LUMBAR INTERBODY FUSION (TLIF) WITH PEDICLE SCREW FIXATION 2 LEVEL N/A 08/25/2013   Procedure:  Lumbar four-five, Lumbar five-Sacral one Transforaminal Lumbar Interbody Fusion ;  Surgeon: Floyce Stakes, MD;  Location: [place] [facility];  Service: Neurosurgery;  Laterality: N/A;    MEDICATIONS: Current Outpatient Medications on File Prior to Visit  Medication Sig Dispense Refill  . albuterol (PROVENTIL HFA;VENTOLIN HFA) 108 (90 BASE) MCG/ACT inhaler Inhale 2 puffs into the lungs 2 (two) times  daily as needed.     . ALPRAZolam (XANAX) 0.25 MG tablet TAKE 1 TABLET BY MOUTH TWICE DAILY AS NEEDED FOR ANXIETY. 60 tablet 2  . baclofen (LIORESAL) 20 MG tablet TAKE 1 TABLET BY MOUTH 3 TIMES DAILY. (Patient taking differently: Prn) 90 tablet 2  . budesonide (ENTOCORT EC) 3 MG 24 hr capsule Take 3 capsules (9 mg total) by mouth daily. 90 capsule 2  . Cholecalciferol (VITAMIN D3) 125 MCG (5000 UT) CAPS Take 2 capsules by mouth daily.    . colestipol (COLESTID) 1 g tablet  Take 1 tablet (1 g total) by mouth daily. Take daily at lunch 30 tablet 3  . DULoxetine (CYMBALTA) 60 MG capsule Take 60 mg by mouth daily.    . hyoscyamine (LEVSIN) 0.125 MG tablet Take 0.125 mg by mouth every 4 (four) hours as needed.    [name] levothyroxine (SYNTHROID) 137 MCG tablet Take 137 mcg by mouth daily before breakfast.    . morphine (MS CONTIN) 30 MG 12 hr tablet TAKE (1) TABLET EVERY TWELVE HOURS. 60 tablet 0  . nortriptyline (PAMELOR) 50 MG capsule TAKE 1 CAPSULE BY MOUTH AT BEDTIME. 30 capsule 3  . omeprazole (PRILOSEC) 40 MG capsule Take 1 capsule (40 mg total) by mouth daily. 30 capsule 3  . oxyCODONE-acetaminophen (PERCOCET) 10-325 MG tablet TAKE 1 TABLET EVERY 6 HOURS AS NEEDED FORPAIN 120 tablet 0  . simvastatin (ZOCOR) 20 MG tablet Take 20 mg by mouth daily.    . vitamin B-12 (CYANOCOBALAMIN) 1000 MCG tablet Take 1,000 mcg by mouth daily.     No current facility-administered medications on file prior to visit.    ALLERGIES: Allergies  Allergen Reactions  . Codeine Nausea Only and Other (See Comments)    Other reaction(s): Abdominal Pain  . Tegretol [Carbamazepine] Other (See Comments)    "made her feel stupid"    FAMILY HISTORY: Family History  Problem Relation Age of Onset  . Anesthesia problems Mother   . COPD Mother   . Diabetes Mother   . Anesthesia problems Sister   . Hypotension Neg Hx   . Malignant hyperthermia Neg Hx   . Pseudochol deficiency Neg Hx   . Colon cancer Neg Hx   . Esophageal cancer Neg Hx   . Liver disease Neg Hx   . Pancreatic cancer Neg Hx    ***.  SOCIAL HISTORY: Social History   Socioeconomic History  . Marital status: Married    Spouse name: Not on file  . Number of children: 0  . Years of education: 32  . Highest education level: Not on file  Occupational History  . Occupation: Disabled  Tobacco Use  . Smoking status: Never Smoker  . Smokeless tobacco: Never Used  Substance and Sexual Activity  . Alcohol use: Yes     Alcohol/week: 0.0 standard drinks    Comment: occ- 0.5 can beer every 2 months  . Drug use: No  . Sexual activity: Not Currently  Other Topics Concern  . Not on file  Social History Narrative   Lives at home with husband.   Caffeine use: 1 cup coffee 3 times per week.    Social Determinants of Health   Financial Resource Strain:   . Difficulty of Paying Living Expenses: Not on file  Food Insecurity:   . Worried About Charity fundraiser in the Last Year: Not on file  . Ran Out of Food in the Last Year: Not on file  Transportation Needs:   .  Lack of Transportation (Medical): Not on file  . Lack of Transportation (Non-Medical): Not on file  Physical Activity:   . Days of Exercise per Week: Not on file  . Minutes of Exercise per Session: Not on file  Stress:   . Feeling of Stress : Not on file  Social Connections:   . Frequency of Communication with Friends and Family: Not on file  . Frequency of Social Gatherings with Friends and Family: Not on file  . Attends Religious Services: Not on file  . Active Member of Clubs or Organizations: Not on file  . Attends Archivist Meetings: Not on file  . Marital Status: Not on file  Intimate Partner Violence:   . Fear of Current or Ex-Partner: Not on file  . Emotionally Abused: Not on file  . Physically Abused: Not on file  . Sexually Abused: Not on file    REVIEW OF SYSTEMS: Constitutional: No fevers, chills, or sweats, no generalized fatigue, change in appetite Eyes: No visual changes, double vision, eye pain Ear, nose and throat: No hearing loss, ear pain, nasal congestion, sore throat Cardiovascular: No chest pain, palpitations Respiratory:  No shortness of breath at rest or with exertion, wheezes GastrointestinaI: No nausea, vomiting, diarrhea, abdominal pain, fecal incontinence Genitourinary:  No dysuria, urinary retention or frequency Musculoskeletal:  No neck pain, back pain Integumentary: No rash, pruritus, skin  lesions Neurological: as above Psychiatric: No depression, insomnia, anxiety Endocrine: No palpitations, fatigue, diaphoresis, mood swings, change in appetite, change in weight, increased thirst Hematologic/Lymphatic:  No purpura, petechiae. Allergic/Immunologic: no itchy/runny eyes, nasal congestion, recent allergic reactions, rashes  PHYSICAL EXAM: *** General: No acute distress.  Patient appears ***-groomed.  *** Head:  Normocephalic/atraumatic Eyes:  fundi examined but not visualized Neck: supple, no paraspinal tenderness, full range of motion Back: No paraspinal tenderness Heart: regular rate and rhythm Lungs: Clear to auscultation bilaterally. Vascular: No carotid bruits. Neurological Exam: Mental status: alert and oriented to person, place, and time, recent and remote memory intact, fund of knowledge intact, attention and concentration intact, speech fluent and not dysarthric, language intact. Cranial nerves: CN I: not tested CN II: pupils equal, round and reactive to light, visual fields intact CN III, IV, VI:  full range of motion, no nystagmus, no ptosis CN V: facial sensation intact CN VII: upper and lower face symmetric CN VIII: hearing intact CN IX, X: gag intact, uvula midline CN XI: sternocleidomastoid and trapezius muscles intact CN XII: tongue midline Bulk & Tone: normal, no fasciculations. Motor:  5/5 throughout *** Sensation:  Pinprick *** temperature *** and vibration sensation intact.  ***. Deep Tendon Reflexes:  2+ throughout, *** toes downgoing.  *** Finger to nose testing:  Without dysmetria.  *** Heel to shin:  Without dysmetria.  *** Gait:  Normal station and stride.  Able to turn and tandem walk. [place] ***.  IMPRESSION: ***  PLAN: ***  Thank you for allowing me to take part in the care of this patient.  Metta Clines, DO  CC: ***

## 2020-08-07 ENCOUNTER — Ambulatory Visit: Payer: Medicare Other | Admitting: Neurology

## 2020-08-22 ENCOUNTER — Other Ambulatory Visit: Payer: Medicare Other

## 2020-08-22 ENCOUNTER — Ambulatory Visit (INDEPENDENT_AMBULATORY_CARE_PROVIDER_SITE_OTHER): Payer: Medicare Other | Admitting: Gastroenterology

## 2020-08-22 ENCOUNTER — Encounter: Payer: Self-pay | Admitting: Gastroenterology

## 2020-08-22 VITALS — BP 124/78 | HR 83 | Ht 66.0 in | Wt 234.1 lb

## 2020-08-22 DIAGNOSIS — R197 Diarrhea, unspecified: Secondary | ICD-10-CM

## 2020-08-22 DIAGNOSIS — K52832 Lymphocytic colitis: Secondary | ICD-10-CM

## 2020-08-22 DIAGNOSIS — R1030 Lower abdominal pain, unspecified: Secondary | ICD-10-CM

## 2020-08-22 MED ORDER — BUDESONIDE 3 MG PO CPEP: 9.0000 mg | ORAL_CAPSULE | Freq: Every day | ORAL | 2 refills | Status: DC

## 2020-08-22 MED ORDER — DICYCLOMINE HCL 20 MG PO TABS: 20.0000 mg | ORAL_TABLET | Freq: Three times a day (TID) | ORAL | 3 refills | Status: DC | PRN

## 2020-08-22 NOTE — Progress Notes (Signed)
Sherry Wong    [id number]    1956/05/02  Primary Care Physician:Skillman, Aundra Millet, PA-C  Referring Physician: Rosalee Kaufman, PA-C [place],  [place] [id number]   Chief complaint: Nausea, Abdominal pain, diarrhea  HPI:  64 year old female with history of chronic diarrhea here for follow-up visit for chronic diarrhea  Diarrhea had improved with course of budesonide but her symptoms are recurring after she stopped budesonide.  No improvement with Colestid.  She is having 5-6 bowel movements daily on average associated with lower abdominal cramping and sharp pain.  She is also having nocturnal symptoms with fecal urgency. No blood in stool or melena. She has intermittent nausea but no vomiting or dysphagia.  Colonoscopy December 20, 2019 with biopsies suggestive of lymphocytic colitis.  She was started on budesonide 9 mg daily.     GI pathogen panel negative March 2021 Normal CRP Negative for celiac disease   Outpatient Encounter Medications as of 08/22/2020  Medication Sig  . albuterol (PROVENTIL HFA;VENTOLIN HFA) 108 (90 BASE) MCG/ACT inhaler Inhale 2 puffs into the lungs 2 (two) times daily as needed.   . ALPRAZolam (XANAX) 0.25 MG tablet TAKE 1 TABLET BY MOUTH TWICE DAILY AS NEEDED FOR ANXIETY.  . Cholecalciferol (VITAMIN D3) 125 MCG (5000 UT) CAPS Take 2 capsules by mouth daily.  . DULoxetine (CYMBALTA) 60 MG capsule Take 60 mg by mouth daily.  . hyoscyamine (LEVSIN) 0.125 MG tablet Take 0.125 mg by mouth every 4 (four) hours as needed.  [name] levothyroxine (SYNTHROID) 150 MCG tablet Take 150 mcg by mouth daily before breakfast.  . morphine (MS CONTIN) 30 MG 12 hr tablet TAKE (1) TABLET EVERY TWELVE HOURS.  [name] nortriptyline (PAMELOR) 50 MG capsule TAKE 1 CAPSULE BY MOUTH AT BEDTIME.  [name] omeprazole (PRILOSEC) 40 MG capsule Take 1 capsule (40 mg total) by mouth daily.  [name] oxyCODONE-acetaminophen (PERCOCET) 10-325 MG tablet TAKE 1 TABLET EVERY 6  HOURS AS NEEDED FORPAIN  . simvastatin (ZOCOR) 20 MG tablet Take 20 mg by mouth daily.  . vitamin B-12 (CYANOCOBALAMIN) 1000 MCG tablet Take 1,000 mcg by mouth daily.  . [DISCONTINUED] baclofen (LIORESAL) 20 MG tablet TAKE 1 TABLET BY MOUTH 3 TIMES DAILY. (Patient taking differently: Prn)  . [DISCONTINUED] budesonide (ENTOCORT EC) 3 MG 24 hr capsule Take 3 capsules (9 mg total) by mouth daily.  . [DISCONTINUED] colestipol (COLESTID) 1 g tablet Take 1 tablet (1 g total) by mouth daily. Take daily at lunch  . [DISCONTINUED] levothyroxine (SYNTHROID) 137 MCG tablet Take 137 mcg by mouth daily before breakfast.   No facility-administered encounter medications on file as of 08/22/2020.    Allergies as of 08/22/2020 - Review Complete 08/22/2020  Allergen Reaction Noted  . Codeine Nausea Only and Other (See Comments) 06/21/2011  . Tegretol [carbamazepine] Other (See Comments) 07/24/2017    Past Medical History:  Diagnosis Date  . Anxiety    takes celexa daily  . Arthritis   . Asthma   . Bronchitis   . Chronic back pain    ddd/lumbago,and radiculopathy  . Depression   . Gastric ulcer   . GERD (gastroesophageal reflux disease)    takes Protonix daily  . Gout    hx of  . H/O hiatal hernia   . Hx of colonic polyps   . Hypertension    takes Losartan daily  . Hypothyroidism    takes Levothyroxine daily  . IBS (irritable bowel syndrome)   .  Nocturia   . Pneumonia    hx of 15+yrs ago  . PONV (postoperative nausea and vomiting)   . Ulcerative colitis   . Urinary incontinence     Past Surgical History:  Procedure Laterality Date  . BREAST BIOPSY     right  . BREAST LUMPECTOMY Left 2014  . CHOLECYSTECTOMY     "gangrene in small intestines clipped by surgeon" per patient  . COLONOSCOPY  2009   Dr Rowe Pavy   . cysts removed from ovary    . DILATION AND CURETTAGE OF UTERUS    . ELBOW SURGERY Left   . ESOPHAGOGASTRODUODENOSCOPY    . HEMATOMA EVACUATION N/A 08/25/2013   Procedure:  EVACUATION OF LUMBAR HEMATOMA;  Surgeon: Floyce Stakes, MD;  Location: [facility];  Service: Neurosurgery;  Laterality: N/A;  . LUMBAR LAMINECTOMY/DECOMPRESSION MICRODISCECTOMY  12/03/2011   Procedure: LUMBAR LAMINECTOMY/DECOMPRESSION MICRODISCECTOMY 2 LEVELS;  Surgeon: Floyce Stakes, MD;  Location: [facility];  Service: Neurosurgery;  Laterality: Right;  Right Lumbar four-five Lumbar five sacral one Foraminotomies  . LUMBAR WOUND DEBRIDEMENT N/A 09/01/2013   Procedure: LUMBAR WOUND DEBRIDEMENT;  Surgeon: Floyce Stakes, MD;  Location: [facility];  Service: Neurosurgery;  Laterality: N/A;  . PLACEMENT OF LUMBAR DRAIN N/A 09/01/2013   Procedure: PLACEMENT OF LUMBAR DRAIN;  Surgeon: Floyce Stakes, MD;  Location: [facility];  Service: Neurosurgery;  Laterality: N/A;  . TRANSFORAMINAL LUMBAR INTERBODY FUSION (TLIF) WITH PEDICLE SCREW FIXATION 2 LEVEL N/A 08/25/2013   Procedure:  Lumbar four-five, Lumbar five-Sacral one Transforaminal Lumbar Interbody Fusion ;  Surgeon: Floyce Stakes, MD;  Location: [facility];  Service: Neurosurgery;  Laterality: N/A;    Family History  Problem Relation Age of Onset  . Anesthesia problems Mother   . COPD Mother   . Diabetes Mother   . Anesthesia problems Sister   . Hypotension Neg Hx   . Malignant hyperthermia Neg Hx   . Pseudochol deficiency Neg Hx   . Colon cancer Neg Hx   . Esophageal cancer Neg Hx   . Liver disease Neg Hx   . Pancreatic cancer Neg Hx     Social History   Socioeconomic History  . Marital status: Married    Spouse name: Not on file  . Number of children: 0  . Years of education: 36  . Highest education level: Not on file  Occupational History  . Occupation: Disabled  Tobacco Use  . Smoking status: Never Smoker  . Smokeless tobacco: Never Used  Vaping Use  . Vaping Use: Never used  Substance and Sexual Activity  . Alcohol use: Yes    Alcohol/week: 0.0 standard drinks    Comment: occ- 0.5 can beer every 2  months  . Drug use: No  . Sexual activity: Not Currently  Other Topics Concern  . Not on file  Social History Narrative   Lives at home with husband.   Caffeine use: 1 cup coffee 3 times per week.    Social Determinants of Health   Financial Resource Strain:   . Difficulty of Paying Living Expenses: Not on file  Food Insecurity:   . Worried About Charity fundraiser in the Last Year: Not on file  . Ran Out of Food in the Last Year: Not on file  Transportation Needs:   . Lack of Transportation (Medical): Not on file  . Lack of Transportation (Non-Medical): Not on file  Physical Activity:   . Days of Exercise  per Week: Not on file  . Minutes of Exercise per Session: Not on file  Stress:   . Feeling of Stress : Not on file  Social Connections:   . Frequency of Communication with Friends and Family: Not on file  . Frequency of Social Gatherings with Friends and Family: Not on file  . Attends Religious Services: Not on file  . Active Member of Clubs or Organizations: Not on file  . Attends Archivist Meetings: Not on file  . Marital Status: Not on file  Intimate Partner Violence:   . Fear of Current or Ex-Partner: Not on file  . Emotionally Abused: Not on file  . Physically Abused: Not on file  . Sexually Abused: Not on file      Review of systems: All other review of systems negative except as mentioned in the HPI.   Physical Exam: Vitals:   08/22/20 1418  BP: 124/78  Pulse: 83   Body mass index is 37.79 kg/m. Gen:      No acute distress HEENT:  sclera anicteric Abd:      soft, non-tender; no palpable masses, no distension Ext:    No edema Neuro: alert and oriented x 3 Psych: normal mood and affect  Data Reviewed:  Reviewed labs, radiology imaging, old records and pertinent past GI work up   Assessment and Plan/Recommendations:  64 year old very pleasant female with recurrent chronic diarrhea History of microscopic/lymphocytic colitis  Check  GI pathogen panel to exclude any infectious etiology We will plan for additional course of budesonide taper for recurrent symptoms if negative for any acute GI infection Start budesonide 9 mg daily X 3 months followed by 6 mg daily for a month and 3 mg daily for 1 month  Lower abdominal cramping: Use dicyclomine 20 mg 2-3 times daily as needed Discontinue hyoscyamine  Lactose-free diet  Return in 3 months or sooner if needed   This visit required 35 minutes of patient care (this includes precharting, chart review, review of results, face-to-face time used for counseling as well as treatment plan and follow-up. The patient was provided an opportunity to ask questions and all were answered. The patient agreed with the plan and demonstrated an understanding of the instructions.  Damaris Hippo , MD    CC: Rosalee Kaufman, *

## 2020-08-22 NOTE — Patient Instructions (Signed)
Your provider has requested that you go to the basement level for lab work before leaving today. Press "B" on the elevator. The lab is located at the first door on the left as you exit the elevator.  Gi Pathogen panel  Take Budesonide 9 mg daily for 3 months then taper down to 6 mg daily for 1 month then 3 mg daily for 1 month  We will send dicyclomine 20 mg to your pharmacy  Discontinue hyoscyamine  If you are age 65 or older, your body mass index should be between 23-30. Your Body mass index is 37.79 kg/m. If this is out of the aforementioned range listed, please consider follow up with your Primary Care Provider.  If you are age 71 or younger, your body mass index should be between 19-25. Your Body mass index is 37.79 kg/m. If this is out of the aformentioned range listed, please consider follow up with your Primary Care Provider.    Due to recent changes in healthcare laws, you may see the results of your imaging and laboratory studies on MyChart before your provider has had a chance to review them.  We understand that in some cases there may be results that are confusing or concerning to you. Not all laboratory results come back in the same time frame and the provider may be waiting for multiple results in order to interpret others.  Please give [place] 48 hours in order for your provider to thoroughly review all the results before contacting the office for clarification of your results.   I appreciate the  opportunity to care for you  Thank You   Harl Bowie , MD

## 2020-08-30 ENCOUNTER — Other Ambulatory Visit: Payer: Medicare Other

## 2020-08-30 DIAGNOSIS — R197 Diarrhea, unspecified: Secondary | ICD-10-CM | POA: Diagnosis not present

## 2020-08-30 DIAGNOSIS — R1030 Lower abdominal pain, unspecified: Secondary | ICD-10-CM

## 2020-08-30 DIAGNOSIS — K52832 Lymphocytic colitis: Secondary | ICD-10-CM | POA: Diagnosis not present

## 2020-08-31 ENCOUNTER — Encounter: Payer: Self-pay | Admitting: Gastroenterology

## 2020-09-01 ENCOUNTER — Telehealth: Payer: Self-pay | Admitting: Gastroenterology

## 2020-09-01 LAB — GI PROFILE, STOOL, PCR

## 2020-09-01 MED ORDER — VANCOMYCIN HCL 125 MG PO CAPS: 125.0000 mg | ORAL_CAPSULE | Freq: Four times a day (QID) | ORAL | 0 refills | Status: DC

## 2020-09-01 NOTE — Telephone Encounter (Signed)
Pt states she is unable to afford the medication VANCOCIN, pt would like a cheaper alternative.

## 2020-09-01 NOTE — Telephone Encounter (Signed)
Script sent to pharmacy.

## 2020-09-01 NOTE — Telephone Encounter (Signed)
Thanks [employer].  I called patient and informed her results. Can you send Rx for Vancomycin 125 mg four times daily X 14 days

## 2020-09-01 NOTE — Telephone Encounter (Signed)
Received call from [place] stating pt is positive for cdiff.

## 2020-09-04 NOTE — Telephone Encounter (Signed)
Patient called Friday at 4:57pm, Phone call not returned yet, What do you recommend other than Vanco

## 2020-09-04 NOTE — Telephone Encounter (Signed)
Please check if Dificid is covered, if not we will plan to treat with metronidazole 500 mg every 8 hours X 14 days though it is not first choice medication for C. difficile colitis.

## 2020-09-04 NOTE — Telephone Encounter (Signed)
Spoke to pharmacist and Deficid is more expensive than the Vancomycin, Called in, per Dr Silverio Decamp metronidazole 500 every 8 hours for 14 days   Vanco for 14 days was $100 for pt      Patient aware

## 2020-09-20 ENCOUNTER — Encounter: Payer: Medicare Other | Attending: Physical Medicine and Rehabilitation | Admitting: Registered Nurse

## 2020-09-20 ENCOUNTER — Other Ambulatory Visit: Payer: Self-pay

## 2020-09-20 VITALS — BP 139/85 | HR 80 | Temp 98.4°F | Ht 66.0 in | Wt 238.4 lb

## 2020-09-20 DIAGNOSIS — Z79891 Long term (current) use of opiate analgesic: Secondary | ICD-10-CM

## 2020-09-20 DIAGNOSIS — M961 Postlaminectomy syndrome, not elsewhere classified: Secondary | ICD-10-CM

## 2020-09-20 DIAGNOSIS — M5416 Radiculopathy, lumbar region: Secondary | ICD-10-CM | POA: Diagnosis not present

## 2020-09-20 DIAGNOSIS — M7061 Trochanteric bursitis, right hip: Secondary | ICD-10-CM | POA: Diagnosis not present

## 2020-09-20 DIAGNOSIS — M5136 Other intervertebral disc degeneration, lumbar region: Secondary | ICD-10-CM | POA: Diagnosis not present

## 2020-09-20 DIAGNOSIS — G834 Cauda equina syndrome: Secondary | ICD-10-CM | POA: Diagnosis not present

## 2020-09-20 DIAGNOSIS — F3289 Other specified depressive episodes: Secondary | ICD-10-CM | POA: Diagnosis present

## 2020-09-20 DIAGNOSIS — F411 Generalized anxiety disorder: Secondary | ICD-10-CM | POA: Diagnosis present

## 2020-09-20 DIAGNOSIS — G894 Chronic pain syndrome: Secondary | ICD-10-CM

## 2020-09-20 DIAGNOSIS — Z5181 Encounter for therapeutic drug level monitoring: Secondary | ICD-10-CM

## 2020-09-20 DIAGNOSIS — M7062 Trochanteric bursitis, left hip: Secondary | ICD-10-CM

## 2020-09-20 DIAGNOSIS — M51369 Other intervertebral disc degeneration, lumbar region without mention of lumbar back pain or lower extremity pain: Secondary | ICD-10-CM

## 2020-09-20 MED ORDER — ALPRAZOLAM 0.25 MG PO TABS: ORAL_TABLET | ORAL | 2 refills | Status: DC

## 2020-09-20 MED ORDER — OXYCODONE-ACETAMINOPHEN 10-325 MG PO TABS: ORAL_TABLET | ORAL | 0 refills | Status: DC

## 2020-09-20 MED ORDER — MORPHINE SULFATE ER 30 MG PO TBCR: EXTENDED_RELEASE_TABLET | ORAL | 0 refills | Status: DC

## 2020-09-20 NOTE — Progress Notes (Signed)
Subjective:    Patient ID: Sherry Wong, female    DOB: Apr 01, 1956, 64 y.o.   MRN: [id number]  HPI: Sherry Wong is a 64 y.o. female who returns for follow up appointment for chronic pain and medication refill. She states her pain is located in her lower back radiating into her bilateral hips and bilateral lower extremities. She rates her pain 5. Hercurrent exercise regime is walking and performing stretching exercises.  Sherry Wong Morphine equivalent is 120.00 MME.  She is also prescribed Alprazolam. We have discussed the black box warning of using opioids and benzodiazepines. I highlighted the dangers of using these drugs together and discussed the adverse events including respiratory suppression, overdose, cognitive impairment and importance of compliance with current regimen. We will continue to monitor and adjust as indicated.    Last Oral Swab was Performed on 03/21/2020, it was consistent for oxycodone. Sherry Wong reports she's compliant with her Morphine.   Pain Inventory Average Pain 8 Pain Right Now 5 My pain is sharp, burning, stabbing, tingling and aching  In the last 24 hours, has pain interfered with the following? General activity 8 Relation with others 8 Enjoyment of life 9 What TIME of day is your pain at its worst? morning , daytime, evening and night Sleep (in general) Fair  Pain is worse with: walking, bending, sitting, inactivity, standing and some activites Pain improves with: rest, heat/ice, pacing activities and medication Relief from Meds: 6  Family History  Problem Relation Age of Onset  . Anesthesia problems Mother   . COPD Mother   . Diabetes Mother   . Anesthesia problems Sister   . Hypotension Neg Hx   . Malignant hyperthermia Neg Hx   . Pseudochol deficiency Neg Hx   . Colon cancer Neg Hx   . Esophageal cancer Neg Hx   . Liver disease Neg Hx   . Pancreatic cancer Neg Hx    Social History   Socioeconomic History  . Marital status:  Married    Spouse name: Not on file  . Number of children: 0  . Years of education: 32  . Highest education level: Not on file  Occupational History  . Occupation: Disabled  Tobacco Use  . Smoking status: Never Smoker  . Smokeless tobacco: Never Used  Vaping Use  . Vaping Use: Never used  Substance and Sexual Activity  . Alcohol use: Yes    Alcohol/week: 0.0 standard drinks    Comment: occ- 0.5 can beer every 2 months  . Drug use: No  . Sexual activity: Not Currently  Other Topics Concern  . Not on file  Social History Narrative   Lives at home with husband.   Caffeine use: 1 cup coffee 3 times per week.    Social Determinants of Health   Financial Resource Strain: Not on file  Food Insecurity: Not on file  Transportation Needs: Not on file  Physical Activity: Not on file  Stress: Not on file  Social Connections: Not on file   Past Surgical History:  Procedure Laterality Date  . BREAST BIOPSY     right  . BREAST LUMPECTOMY Left 2014  . CHOLECYSTECTOMY     "gangrene in small intestines clipped by surgeon" per patient  . COLONOSCOPY  2009   Dr Rowe Pavy   . cysts removed from ovary    . DILATION AND CURETTAGE OF UTERUS    . ELBOW SURGERY Left   . ESOPHAGOGASTRODUODENOSCOPY    . HEMATOMA EVACUATION N/A  08/25/2013   Procedure: EVACUATION OF LUMBAR HEMATOMA;  Surgeon: Floyce Stakes, MD;  Location: [facility];  Service: Neurosurgery;  Laterality: N/A;  . LUMBAR LAMINECTOMY/DECOMPRESSION MICRODISCECTOMY  12/03/2011   Procedure: LUMBAR LAMINECTOMY/DECOMPRESSION MICRODISCECTOMY 2 LEVELS;  Surgeon: Floyce Stakes, MD;  Location: [facility];  Service: Neurosurgery;  Laterality: Right;  Right Lumbar four-five Lumbar five sacral one Foraminotomies  . LUMBAR WOUND DEBRIDEMENT N/A 09/01/2013   Procedure: LUMBAR WOUND DEBRIDEMENT;  Surgeon: Floyce Stakes, MD;  Location: [facility];  Service: Neurosurgery;  Laterality: N/A;  . PLACEMENT OF LUMBAR DRAIN N/A 09/01/2013    Procedure: PLACEMENT OF LUMBAR DRAIN;  Surgeon: Floyce Stakes, MD;  Location: [facility];  Service: Neurosurgery;  Laterality: N/A;  . TRANSFORAMINAL LUMBAR INTERBODY FUSION (TLIF) WITH PEDICLE SCREW FIXATION 2 LEVEL N/A 08/25/2013   Procedure:  Lumbar four-five, Lumbar five-Sacral one Transforaminal Lumbar Interbody Fusion ;  Surgeon: Floyce Stakes, MD;  Location: [facility];  Service: Neurosurgery;  Laterality: N/A;   Past Surgical History:  Procedure Laterality Date  . BREAST BIOPSY     right  . BREAST LUMPECTOMY Left 2014  . CHOLECYSTECTOMY     "gangrene in small intestines clipped by surgeon" per patient  . COLONOSCOPY  2009   Dr Rowe Pavy   . cysts removed from ovary    . DILATION AND CURETTAGE OF UTERUS    . ELBOW SURGERY Left   . ESOPHAGOGASTRODUODENOSCOPY    . HEMATOMA EVACUATION N/A 08/25/2013   Procedure: EVACUATION OF LUMBAR HEMATOMA;  Surgeon: Floyce Stakes, MD;  Location: [facility];  Service: Neurosurgery;  Laterality: N/A;  . LUMBAR LAMINECTOMY/DECOMPRESSION MICRODISCECTOMY  12/03/2011   Procedure: LUMBAR LAMINECTOMY/DECOMPRESSION MICRODISCECTOMY 2 LEVELS;  Surgeon: Floyce Stakes, MD;  Location: [facility];  Service: Neurosurgery;  Laterality: Right;  Right Lumbar four-five Lumbar five sacral one Foraminotomies  . LUMBAR WOUND DEBRIDEMENT N/A 09/01/2013   Procedure: LUMBAR WOUND DEBRIDEMENT;  Surgeon: Floyce Stakes, MD;  Location: [facility];  Service: Neurosurgery;  Laterality: N/A;  . PLACEMENT OF LUMBAR DRAIN N/A 09/01/2013   Procedure: PLACEMENT OF LUMBAR DRAIN;  Surgeon: Floyce Stakes, MD;  Location: [facility];  Service: Neurosurgery;  Laterality: N/A;  . TRANSFORAMINAL LUMBAR INTERBODY FUSION (TLIF) WITH PEDICLE SCREW FIXATION 2 LEVEL N/A 08/25/2013   Procedure:  Lumbar four-five, Lumbar five-Sacral one Transforaminal Lumbar Interbody Fusion ;  Surgeon: Floyce Stakes, MD;  Location: [facility];  Service: Neurosurgery;  Laterality: N/A;    Past Medical History:  Diagnosis Date  . Anxiety    takes celexa daily  . Arthritis   . Asthma   . Bronchitis   . Chronic back pain    ddd/lumbago,and radiculopathy  . Depression   . Gastric ulcer   . GERD (gastroesophageal reflux disease)    takes Protonix daily  . Gout    hx of  . H/O hiatal hernia   . Hx of colonic polyps   . Hypertension    takes Losartan daily  . Hypothyroidism    takes Levothyroxine daily  . IBS (irritable bowel syndrome)   . Nocturia   . Pneumonia    hx of 15+yrs ago  . PONV (postoperative nausea and vomiting)   . Ulcerative colitis   . Urinary incontinence    BP 139/85   Pulse 80   Temp 98.4 F (36.9 C)   Ht 5\' 6"  (1.676 m)   Wt 238 lb 6.4 oz (  108.1 kg)   SpO2 95%   BMI 38.48 kg/m   Opioid Risk Score:   Fall Risk Score:  `1  Depression screen PHQ 2/9  Depression screen [facility] 2/9 05/23/2020 03/21/2020 03/24/2019 01/26/2019 07/20/2018 07/24/2017 01/22/2017  Decreased Interest 1 0 1 1 1 2 1   Down, Depressed, Hopeless 1 0 1 1 1 2 1   PHQ - 2 Score 2 0 2 2 2 4 2   Altered sleeping - 0 - - - - -  Tired, decreased energy - 0 - - - - -  Change in appetite - 0 - - - - -  Feeling bad or failure about yourself  - 0 - - - - -  Trouble concentrating - 0 - - - - -  Moving slowly or fidgety/restless - 0 - - - - -  Suicidal thoughts - 0 - - - - -  PHQ-9 Score - 0 - - - - -   Review of Systems     Objective:   Physical Exam Vitals and nursing note reviewed.  Constitutional:      Appearance: Normal appearance.  Cardiovascular:     Rate and Rhythm: Normal rate and regular rhythm.     Pulses: Normal pulses.     Heart sounds: Normal heart sounds.  Pulmonary:     Effort: Pulmonary effort is normal.     Breath sounds: Normal breath sounds.  Musculoskeletal:     Cervical back: Normal range of motion and neck supple.     Comments: Normal Muscle Bulk and Muscle Testing Reveals:  Upper Extremities: Full ROM and Muscle Strength 5/5 Lumbar Paraspinal  Tenderness: L-3-L-5 Left Greater Trochanter Tenderness  Lower Extremities: Full ROM and Muscle Strength 5/5 Arises from chair slowly using cane for support Narrow Based  Gait   Skin:    General: Skin is warm and dry.  Neurological:     Mental Status: She is alert and oriented to person, place, and time.  Psychiatric:        Mood and Affect: Mood normal.        Behavior: Behavior normal.           Assessment & Plan:  1. Cauda equina syndrome: Incontinence: Wearing Depends:Continue to Monitor.09/20/2020. 2. Insomnia: Continue with Nortriptyline.09/20/2020 3. 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 Second scripts sent for the following month We will continue the opioid monitoring program, this consists of regular clinic visits, examinations, urine drug screen, pill counts as well as use of [place] Controlled Substance Reporting system. A 12 month History has been reviewed on the [place] Controlled Substance Reporting Systemon 09/20/2020. 4. Depression: Continue withCymbalta. Continue to Monitor.09/20/2020 5. Anxiety: Continue withcurrent medication regimen withXanax. Continue to Monitor.09/20/2020 6.Neuropathy:Continue:with current medication nregimen with Pamelor.09/20/2020 7. Constipation: Continue Senokot.Continue to Monitor.09/20/2020 8.Muscle Spasm: Continue Baclofen.Continue to Monitor.09/20/2020 9. Lumbar Radiculitis: Continue Pamelor.Continue to Monitor.09/20/2020  F/U in 2 months

## 2020-09-24 ENCOUNTER — Encounter: Payer: Self-pay | Admitting: Registered Nurse

## 2020-09-25 DIAGNOSIS — R059 Cough, unspecified: Secondary | ICD-10-CM | POA: Diagnosis not present

## 2020-09-25 DIAGNOSIS — R0602 Shortness of breath: Secondary | ICD-10-CM | POA: Diagnosis not present

## 2020-10-19 ENCOUNTER — Other Ambulatory Visit: Payer: Medicare Other

## 2020-10-19 ENCOUNTER — Encounter: Payer: Self-pay | Admitting: Gastroenterology

## 2020-10-19 ENCOUNTER — Ambulatory Visit: Payer: Medicare Other | Admitting: Gastroenterology

## 2020-10-19 VITALS — BP 144/78 | HR 84 | Ht 63.75 in | Wt 237.2 lb

## 2020-10-19 DIAGNOSIS — K580 Irritable bowel syndrome with diarrhea: Secondary | ICD-10-CM | POA: Diagnosis not present

## 2020-10-19 DIAGNOSIS — R197 Diarrhea, unspecified: Secondary | ICD-10-CM

## 2020-10-19 DIAGNOSIS — K52832 Lymphocytic colitis: Secondary | ICD-10-CM

## 2020-10-19 DIAGNOSIS — R1030 Lower abdominal pain, unspecified: Secondary | ICD-10-CM

## 2020-10-19 MED ORDER — HYOSCYAMINE SULFATE 0.125 MG SL SUBL: 0.1250 mg | SUBLINGUAL_TABLET | Freq: Three times a day (TID) | SUBLINGUAL | 3 refills | Status: AC | PRN

## 2020-10-19 NOTE — Patient Instructions (Addendum)
Your provider has requested that you go to the basement level for lab work before leaving today. Press "B" on the elevator. The lab is located at the first door on the left as you exit the elevator.  We will send Hyoscyamine to your pharmacy  STOP Dicyclomine   Due to recent changes in healthcare laws, you may see the results of your imaging and laboratory studies on MyChart before your provider has had a chance to review them.  We understand that in some cases there may be results that are confusing or concerning to you. Not all laboratory results come back in the same time frame and the provider may be waiting for multiple results in order to interpret others.  Please give [place] 48 hours in order for your provider to thoroughly review all the results before contacting the office for clarification of your results.   I appreciate the  opportunity to care for you  Thank You   Harl Bowie , MD

## 2020-10-19 NOTE — Progress Notes (Signed)
Sherry Wong    [id number]    [birth year]  Primary Care Physician:Skillman, Sherry Millet, PA-C  Referring Physician: Rosalee Kaufman, PA-C [address] [place],  [place] [address]   Chief complaint: Lymphocytic colitis  HPI:  [name] is a very pleasant 65 year old here for follow-up visit for chronic diarrhea  Colonoscopy December 20, 2019: Right and left colon biopsies consistent with lymphocytic colitis, she was treated with budesonide 9 mg daily for 21-month course.  Developed recurrent diarrhea in November 2021 and was restarted on budesonide 9 mg daily for 3 months, she is almost finishing the 49-month course of budesonide and will be starting 6 mg daily next month  C.diff colitis 08/30/20: Completed 2 weeks of vancomycin   Had covid 2 weeks, pain and diarrhea got worse, feels it slightly better this week.  She is having 2-3 bowel movements daily.  She is taking dicyclomine once daily, does not feel its helping much Her appetite is down. No rectal bleeding   Outpatient Encounter Medications as of 10/19/2020  Medication Sig  . albuterol (PROVENTIL HFA;VENTOLIN HFA) 108 (90 BASE) MCG/ACT inhaler Inhale 2 puffs into the lungs 2 (two) times daily as needed.   . ALPRAZolam (XANAX) 0.25 MG tablet TAKE 1 TABLET BY MOUTH TWICE DAILY AS NEEDED FOR ANXIETY.  . budesonide (ENTOCORT EC) 3 MG 24 hr capsule Take 3 capsules (9 mg total) by mouth daily.  . Cholecalciferol (VITAMIN D3) 125 MCG (5000 UT) CAPS Take 2 capsules by mouth daily.  [name] dicyclomine (BENTYL) 20 MG tablet Take 1 tablet (20 mg total) by mouth every 8 (eight) hours as needed for spasms.  . DULoxetine (CYMBALTA) 60 MG capsule Take 60 mg by mouth daily.  [name] levothyroxine (SYNTHROID) 175 MCG tablet Take 175 mcg by mouth daily.  [name] morphine (MS CONTIN) 30 MG 12 hr tablet TAKE (1) TABLET EVERY TWELVE HOURS.  [name] nortriptyline (PAMELOR) 50 MG capsule TAKE 1 CAPSULE BY MOUTH AT BEDTIME.  [name] omeprazole (PRILOSEC) 40 MG  capsule Take 1 capsule (40 mg total) by mouth daily.  [name] oxyCODONE-acetaminophen (PERCOCET) 10-325 MG tablet TAKE 1 TABLET EVERY 6 HOURS AS NEEDED FORPAIN  . simvastatin (ZOCOR) 20 MG tablet Take 20 mg by mouth daily.  . vancomycin (VANCOCIN) 125 MG capsule Take 1 capsule (125 mg total) by mouth 4 (four) times daily.  . vitamin B-12 (CYANOCOBALAMIN) 1000 MCG tablet Take 1,000 mcg by mouth daily.  . [DISCONTINUED] levothyroxine (SYNTHROID) 150 MCG tablet Take 150 mcg by mouth daily before breakfast.   No facility-administered encounter medications on file as of 10/19/2020.    Allergies as of 10/19/2020 - Review Complete 10/19/2020  Allergen Reaction Noted  . Codeine Nausea Only and Other (See Comments) 06/21/2011  . Tegretol [carbamazepine] Other (See Comments) 07/24/2017    Past Medical History:  Diagnosis Date  . Anxiety    takes celexa daily  . Arthritis   . Asthma   . Bronchitis   . Chronic back pain    ddd/lumbago,and radiculopathy  . COVID-17 Sep 2020  . Depression   . Gastric ulcer   . GERD (gastroesophageal reflux disease)    takes Protonix daily  . Gout    hx of  . H/O hiatal hernia   . Hx of colonic polyps   . Hypertension    takes Losartan daily  . Hypothyroidism    takes Levothyroxine daily  . IBS (irritable bowel syndrome)   .  Nocturia   . Pneumonia    hx of 15+yrs ago  . PONV (postoperative nausea and vomiting)   . Ulcerative colitis   . Urinary incontinence     Past Surgical History:  Procedure Laterality Date  . BREAST BIOPSY     right  . BREAST LUMPECTOMY Left 2014  . CHOLECYSTECTOMY     "gangrene in small intestines clipped by surgeon" per patient  . COLONOSCOPY  2009   Dr Rowe Pavy   . cysts removed from ovary    . DILATION AND CURETTAGE OF UTERUS    . ELBOW SURGERY Left   . ESOPHAGOGASTRODUODENOSCOPY    . HEMATOMA EVACUATION N/A 08/25/2013   Procedure: EVACUATION OF LUMBAR HEMATOMA;  Surgeon: Floyce Stakes, MD;  Location: [facility];   Service: Neurosurgery;  Laterality: N/A;  . LUMBAR LAMINECTOMY/DECOMPRESSION MICRODISCECTOMY  12/03/2011   Procedure: LUMBAR LAMINECTOMY/DECOMPRESSION MICRODISCECTOMY 2 LEVELS;  Surgeon: Floyce Stakes, MD;  Location: [place] [facility];  Service: Neurosurgery;  Laterality: Right;  Right Lumbar four-five Lumbar five sacral one Foraminotomies  . LUMBAR WOUND DEBRIDEMENT N/A 09/01/2013   Procedure: LUMBAR WOUND DEBRIDEMENT;  Surgeon: Floyce Stakes, MD;  Location: [facility];  Service: Neurosurgery;  Laterality: N/A;  . PLACEMENT OF LUMBAR DRAIN N/A 09/01/2013   Procedure: PLACEMENT OF LUMBAR DRAIN;  Surgeon: Floyce Stakes, MD;  Location: [facility];  Service: Neurosurgery;  Laterality: N/A;  . TRANSFORAMINAL LUMBAR INTERBODY FUSION (TLIF) WITH PEDICLE SCREW FIXATION 2 LEVEL N/A 08/25/2013   Procedure:  Lumbar four-five, Lumbar five-Sacral one Transforaminal Lumbar Interbody Fusion ;  Surgeon: Floyce Stakes, MD;  Location: [place] [facility];  Service: Neurosurgery;  Laterality: N/A;    Family History  Problem Relation Age of Onset  . Anesthesia problems Mother   . COPD Mother   . Diabetes Mother   . Anesthesia problems Sister   . Hypotension Neg Hx   . Malignant hyperthermia Neg Hx   . Pseudochol deficiency Neg Hx   . Colon cancer Neg Hx   . Esophageal cancer Neg Hx   . Liver disease Neg Hx   . Pancreatic cancer Neg Hx     Social History   Socioeconomic History  . Marital status: Married    Spouse name: Not on file  . Number of children: 0  . Years of education: 26  . Highest education level: Not on file  Occupational History  . Occupation: Disabled  Tobacco Use  . Smoking status: Never Smoker  . Smokeless tobacco: Never Used  Vaping Use  . Vaping Use: Never used  Substance and Sexual Activity  . Alcohol use: Yes    Alcohol/week: 0.0 standard drinks    Comment: occ- 0.5 can beer every 2 months  . Drug use: No  . Sexual activity: Not Currently  Other Topics Concern  . Not  on file  Social History Narrative   Lives at home with husband.   Caffeine use: 1 cup coffee 3 times per week.    Social Determinants of Health   Financial Resource Strain: Not on file  Food Insecurity: Not on file  Transportation Needs: Not on file  Physical Activity: Not on file  Stress: Not on file  Social Connections: Not on file  Intimate Partner Violence: Not on file      Review of systems: All other review of systems negative except as mentioned in the HPI.   Physical Exam: Vitals:   10/19/20 1031  BP: (!) 144/78  Pulse:  84   Body mass index is 41.04 kg/m. Gen:      No acute distress HEENT:  sclera anicteric Abd:      soft, non-tender; no palpable masses, no distension Ext:    No edema Neuro: alert and oriented x 3 Psych: normal mood and affect  Data Reviewed:  Reviewed labs, radiology imaging, old records and pertinent past GI work up   Assessment and Plan/Recommendations:  65 year old very pleasant female here for follow-up visit for chronic diarrhea, has history of lymphocytic colitis and C. difficile colitis  Persistent symptoms of diarrhea and abdominal cramping Check stool C. difficile PCR to exclude persistent infection  Continue budesonide taper for lymphocytic colitis  Will plan to switch to hyoscyamine 0.125 mg tablet up to 3 times daily as needed, stop dicyclomine given she has not had any significant improvement with it.  Return in 3 months or sooner if needed  This visit required 40 minutes of patient care (this includes precharting, chart review, review of results, face-to-face time used for counseling as well as treatment plan and follow-up. The patient was provided an opportunity to ask questions and all were answered. The patient agreed with the plan and demonstrated an understanding of the instructions.  Damaris Hippo , MD    CC: Sherry Wong, *

## 2020-10-25 ENCOUNTER — Other Ambulatory Visit: Payer: Medicare Other

## 2020-10-25 DIAGNOSIS — A048 Other specified bacterial intestinal infections: Secondary | ICD-10-CM | POA: Diagnosis not present

## 2020-10-25 DIAGNOSIS — K580 Irritable bowel syndrome with diarrhea: Secondary | ICD-10-CM

## 2020-10-25 DIAGNOSIS — R197 Diarrhea, unspecified: Secondary | ICD-10-CM

## 2020-10-25 DIAGNOSIS — K52832 Lymphocytic colitis: Secondary | ICD-10-CM

## 2020-10-25 DIAGNOSIS — R1030 Lower abdominal pain, unspecified: Secondary | ICD-10-CM | POA: Diagnosis not present

## 2020-10-26 LAB — CLOSTRIDIUM DIFFICILE BY PCR: Toxigenic C. Difficile by PCR: NEGATIVE

## 2020-10-30 LAB — GI PROFILE, STOOL, PCR

## 2020-11-21 ENCOUNTER — Encounter: Payer: Self-pay | Admitting: Registered Nurse

## 2020-11-21 ENCOUNTER — Encounter: Payer: Medicare Other | Attending: Physical Medicine and Rehabilitation | Admitting: Registered Nurse

## 2020-11-21 ENCOUNTER — Other Ambulatory Visit: Payer: Self-pay

## 2020-11-21 VITALS — BP 123/84 | HR 89 | Temp 98.6°F | Ht 63.7 in | Wt 234.0 lb

## 2020-11-21 DIAGNOSIS — Z79891 Long term (current) use of opiate analgesic: Secondary | ICD-10-CM | POA: Insufficient documentation

## 2020-11-21 DIAGNOSIS — M961 Postlaminectomy syndrome, not elsewhere classified: Secondary | ICD-10-CM | POA: Diagnosis not present

## 2020-11-21 DIAGNOSIS — M5136 Other intervertebral disc degeneration, lumbar region: Secondary | ICD-10-CM | POA: Insufficient documentation

## 2020-11-21 DIAGNOSIS — F411 Generalized anxiety disorder: Secondary | ICD-10-CM | POA: Insufficient documentation

## 2020-11-21 DIAGNOSIS — M5416 Radiculopathy, lumbar region: Secondary | ICD-10-CM | POA: Diagnosis not present

## 2020-11-21 DIAGNOSIS — F3289 Other specified depressive episodes: Secondary | ICD-10-CM | POA: Diagnosis present

## 2020-11-21 DIAGNOSIS — G894 Chronic pain syndrome: Secondary | ICD-10-CM | POA: Diagnosis not present

## 2020-11-21 DIAGNOSIS — M7062 Trochanteric bursitis, left hip: Secondary | ICD-10-CM | POA: Diagnosis not present

## 2020-11-21 DIAGNOSIS — M79671 Pain in right foot: Secondary | ICD-10-CM | POA: Diagnosis not present

## 2020-11-21 DIAGNOSIS — M7061 Trochanteric bursitis, right hip: Secondary | ICD-10-CM | POA: Diagnosis not present

## 2020-11-21 DIAGNOSIS — G834 Cauda equina syndrome: Secondary | ICD-10-CM | POA: Diagnosis not present

## 2020-11-21 DIAGNOSIS — Z5181 Encounter for therapeutic drug level monitoring: Secondary | ICD-10-CM | POA: Diagnosis not present

## 2020-11-21 DIAGNOSIS — M79672 Pain in left foot: Secondary | ICD-10-CM | POA: Diagnosis not present

## 2020-11-21 MED ORDER — ALPRAZOLAM 0.25 MG PO TABS: ORAL_TABLET | ORAL | 2 refills | Status: DC

## 2020-11-21 MED ORDER — MORPHINE SULFATE ER 30 MG PO TBCR
EXTENDED_RELEASE_TABLET | ORAL | 0 refills | Status: DC
Start: 2020-11-21 — End: 2020-12-21

## 2020-11-21 MED ORDER — OXYCODONE-ACETAMINOPHEN 10-325 MG PO TABS
ORAL_TABLET | ORAL | 0 refills | Status: DC
Start: 2020-11-21 — End: 2020-12-21

## 2020-11-21 MED ORDER — NORTRIPTYLINE HCL 50 MG PO CAPS
50.0000 mg | ORAL_CAPSULE | Freq: Every day | ORAL | 3 refills | Status: DC
Start: 2020-11-21 — End: 2021-01-19

## 2020-11-21 NOTE — Progress Notes (Signed)
Subjective:    Patient ID: Sherry Wong, female    DOB: 1956-08-21, 65 y.o.   MRN: [id number]  HPI: Sherry Wong is a 65 y.o. female who returns for follow up appointment for chronic pain and medication refill. She states her pain is located in her lower back pain radiating into her bilateral hips and bilateral lower extremities and bilateral feet pain. She rates her pain 5. Her current exercise regime is going to the [facility] three days a week and  Walking.  Ms. Ihrig Morphine equivalent is 120.00 MME.   She is also prescribed Alprazolam.We have discussed the black box warning of using opioids and benzodiazepines. I highlighted the dangers of using these drugs together and discussed the adverse events including respiratory suppression, overdose, cognitive impairment and importance of compliance with current regimen. We will continue to monitor and adjust as indicated.   UDS ordered today.    Pain Inventory Average Pain 7 Pain Right Now 5 My pain is constant, sharp, burning, tingling, aching and pumping pain.  In the last 24 hours, has pain interfered with the following? General activity 9 Relation with others 9 Enjoyment of life 9 What TIME of day is your pain at its worst? morning , daytime, evening and night Sleep (in general) Fair  Pain is worse with: walking, bending, standing and some activites Pain improves with: rest, heat/ice and medication Relief from Meds: 6  Family History  Problem Relation Age of Onset  . Anesthesia problems Mother   . COPD Mother   . Diabetes Mother   . Anesthesia problems Sister   . Hypotension Neg Hx   . Malignant hyperthermia Neg Hx   . Pseudochol deficiency Neg Hx   . Colon cancer Neg Hx   . Esophageal cancer Neg Hx   . Liver disease Neg Hx   . Pancreatic cancer Neg Hx    Social History   Socioeconomic History  . Marital status: Married    Spouse name: Not on file  . Number of children: 0  . Years of education: 17  . Highest  education level: Not on file  Occupational History  . Occupation: Disabled  Tobacco Use  . Smoking status: Never Smoker  . Smokeless tobacco: Never Used  Vaping Use  . Vaping Use: Never used  Substance and Sexual Activity  . Alcohol use: Yes    Alcohol/week: 0.0 standard drinks    Comment: occ- 0.5 can beer every 2 months  . Drug use: No  . Sexual activity: Not Currently  Other Topics Concern  . Not on file  Social History Narrative   Lives at home with husband.   Caffeine use: 1 cup coffee 3 times per week.    Social Determinants of Health   Financial Resource Strain: Not on file  Food Insecurity: Not on file  Transportation Needs: Not on file  Physical Activity: Not on file  Stress: Not on file  Social Connections: Not on file   Past Surgical History:  Procedure Laterality Date  . BREAST BIOPSY     right  . BREAST LUMPECTOMY Left 2014  . CHOLECYSTECTOMY     "gangrene in small intestines clipped by surgeon" per patient  . COLONOSCOPY  2009   Dr Rowe Pavy   . cysts removed from ovary    . DILATION AND CURETTAGE OF UTERUS    . ELBOW SURGERY Left   . ESOPHAGOGASTRODUODENOSCOPY    . HEMATOMA EVACUATION N/A 08/25/2013   Procedure: EVACUATION OF LUMBAR  HEMATOMA;  Surgeon: Floyce Stakes, MD;  Location: [facility];  Service: Neurosurgery;  Laterality: N/A;  . LUMBAR LAMINECTOMY/DECOMPRESSION MICRODISCECTOMY  12/03/2011   Procedure: LUMBAR LAMINECTOMY/DECOMPRESSION MICRODISCECTOMY 2 LEVELS;  Surgeon: Floyce Stakes, MD;  Location: [place] [facility];  Service: Neurosurgery;  Laterality: Right;  Right Lumbar four-five Lumbar five sacral one Foraminotomies  . LUMBAR WOUND DEBRIDEMENT N/A 09/01/2013   Procedure: LUMBAR WOUND DEBRIDEMENT;  Surgeon: Floyce Stakes, MD;  Location: [facility];  Service: Neurosurgery;  Laterality: N/A;  . PLACEMENT OF LUMBAR DRAIN N/A 09/01/2013   Procedure: PLACEMENT OF LUMBAR DRAIN;  Surgeon: Floyce Stakes, MD;  Location: [facility];  Service:  Neurosurgery;  Laterality: N/A;  . TRANSFORAMINAL LUMBAR INTERBODY FUSION (TLIF) WITH PEDICLE SCREW FIXATION 2 LEVEL N/A 08/25/2013   Procedure:  Lumbar four-five, Lumbar five-Sacral one Transforaminal Lumbar Interbody Fusion ;  Surgeon: Floyce Stakes, MD;  Location: [place] [facility];  Service: Neurosurgery;  Laterality: N/A;   Past Surgical History:  Procedure Laterality Date  . BREAST BIOPSY     right  . BREAST LUMPECTOMY Left 2014  . CHOLECYSTECTOMY     "gangrene in small intestines clipped by surgeon" per patient  . COLONOSCOPY  2009   Dr Rowe Pavy   . cysts removed from ovary    . DILATION AND CURETTAGE OF UTERUS    . ELBOW SURGERY Left   . ESOPHAGOGASTRODUODENOSCOPY    . HEMATOMA EVACUATION N/A 08/25/2013   Procedure: EVACUATION OF LUMBAR HEMATOMA;  Surgeon: Floyce Stakes, MD;  Location: [facility];  Service: Neurosurgery;  Laterality: N/A;  . LUMBAR LAMINECTOMY/DECOMPRESSION MICRODISCECTOMY  12/03/2011   Procedure: LUMBAR LAMINECTOMY/DECOMPRESSION MICRODISCECTOMY 2 LEVELS;  Surgeon: Floyce Stakes, MD;  Location: [place] [facility];  Service: Neurosurgery;  Laterality: Right;  Right Lumbar four-five Lumbar five sacral one Foraminotomies  . LUMBAR WOUND DEBRIDEMENT N/A 09/01/2013   Procedure: LUMBAR WOUND DEBRIDEMENT;  Surgeon: Floyce Stakes, MD;  Location: [facility];  Service: Neurosurgery;  Laterality: N/A;  . PLACEMENT OF LUMBAR DRAIN N/A 09/01/2013   Procedure: PLACEMENT OF LUMBAR DRAIN;  Surgeon: Floyce Stakes, MD;  Location: [facility];  Service: Neurosurgery;  Laterality: N/A;  . TRANSFORAMINAL LUMBAR INTERBODY FUSION (TLIF) WITH PEDICLE SCREW FIXATION 2 LEVEL N/A 08/25/2013   Procedure:  Lumbar four-five, Lumbar five-Sacral one Transforaminal Lumbar Interbody Fusion ;  Surgeon: Floyce Stakes, MD;  Location: [place] [facility];  Service: Neurosurgery;  Laterality: N/A;   Past Medical History:  Diagnosis Date  . Anxiety    takes celexa daily  . Arthritis   . Asthma   .  Bronchitis   . Chronic back pain    ddd/lumbago,and radiculopathy  . COVID-17 Sep 2020  . Depression   . Gastric ulcer   . GERD (gastroesophageal reflux disease)    takes Protonix daily  . Gout    hx of  . H/O hiatal hernia   . Hx of colonic polyps   . Hypertension    takes Losartan daily  . Hypothyroidism    takes Levothyroxine daily  . IBS (irritable bowel syndrome)   . Nocturia   . Pneumonia    hx of 15+yrs ago  . PONV (postoperative nausea and vomiting)   . Ulcerative colitis   . Urinary incontinence    There were no vitals taken for this visit.  Opioid Risk Score:   Fall Risk Score:  `1  Depression screen PHQ 2/9  Depression  screen [facility] 2/9 05/23/2020 03/21/2020 03/24/2019 01/26/2019 07/20/2018 07/24/2017 01/22/2017  Decreased Interest 1 0 1 1 1 2 1   Down, Depressed, Hopeless 1 0 1 1 1 2 1   PHQ - 2 Score 2 0 2 2 2 4 2   Altered sleeping - 0 - - - - -  Tired, decreased energy - 0 - - - - -  Change in appetite - 0 - - - - -  Feeling bad or failure about yourself  - 0 - - - - -  Trouble concentrating - 0 - - - - -  Moving slowly or fidgety/restless - 0 - - - - -  Suicidal thoughts - 0 - - - - -  PHQ-9 Score - 0 - - - - -   Review of Systems  Musculoskeletal: Positive for arthralgias, back pain and gait problem.       Pain in feet  All other systems reviewed and are negative.      Objective:   Physical Exam Vitals and nursing note reviewed.  Constitutional:      Appearance: Normal appearance. She is obese.  Cardiovascular:     Rate and Rhythm: Normal rate and regular rhythm.     Pulses: Normal pulses.     Heart sounds: Normal heart sounds.  Pulmonary:     Effort: Pulmonary effort is normal.     Breath sounds: Normal breath sounds.  Musculoskeletal:     Cervical back: Normal range of motion and neck supple.     Comments: Normal Muscle Bulk and Muscle Testing Reveals:  Upper Extremities: Full ROM and Muscle Strength 5/5  Lumbar Paraspinal Tenderness:  L-4-L-5 Bilateral Greater Trochanter tenderness Lower Extremities: Decreased ROM and Muscle Strength 5/5 Bilateral Lower Extremities Flexion Produces Pain into her Bilateral Lower Extremities Arises from Table Slowly using cane for support Antalgic Gait   Skin:    General: Skin is warm and dry.  Neurological:     Mental Status: She is alert and oriented to person, place, and time.  Psychiatric:        Mood and Affect: Mood normal.        Behavior: Behavior normal.           Assessment & Plan:  1. Cauda equina syndrome: Incontinence: Wearing Depends:Continue to Monitor.11/21/2020. 2. Insomnia: Continue with Nortriptyline.11/21/2020 3. 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 Second scripts sent for the following month We will continue the opioid monitoring program, this consists of regular clinic visits, examinations, urine drug screen, pill counts as well as use of [place] Controlled Substance Reporting system. A 12 month History has been reviewed on the [place] Controlled Substance Reporting Systemon02/22/2022. 4. Depression: Continue withCymbalta. Continue to Monitor.11/21/2020 5. Anxiety: Continue withcurrent medication regimen withXanax. Continue to Monitor.11/21/2020 6.Neuropathy:Continue:with current medication nregimen with Pamelor.11/21/2020 7. Constipation: Continue Senokot.Continue to Monitor.11/21/2020 8.Muscle Spasm: Continue Baclofen.Continue to Monitor.11/21/2020 9. Lumbar Radiculitis: Continue Pamelor.Continue to Monitor.11/21/2020 10. Bilateral Foot Pain: She denies falling. Ms. Gasner will F/U with her PCP/ Podiatrist. Continue to monitor.   F/U in 2 months

## 2020-11-24 LAB — TOXASSURE SELECT,+ANTIDEPR,UR

## 2020-11-28 ENCOUNTER — Telehealth: Payer: Self-pay | Admitting: *Deleted

## 2020-11-28 NOTE — Telephone Encounter (Signed)
Urine drug screen for this encounter is consistent for prescribed medication 

## 2020-12-01 DIAGNOSIS — K580 Irritable bowel syndrome with diarrhea: Secondary | ICD-10-CM | POA: Diagnosis not present

## 2020-12-01 DIAGNOSIS — R609 Edema, unspecified: Secondary | ICD-10-CM | POA: Diagnosis not present

## 2020-12-01 DIAGNOSIS — E039 Hypothyroidism, unspecified: Secondary | ICD-10-CM | POA: Diagnosis not present

## 2020-12-01 DIAGNOSIS — K219 Gastro-esophageal reflux disease without esophagitis: Secondary | ICD-10-CM | POA: Diagnosis not present

## 2020-12-01 DIAGNOSIS — E7849 Other hyperlipidemia: Secondary | ICD-10-CM | POA: Diagnosis not present

## 2020-12-01 DIAGNOSIS — E782 Mixed hyperlipidemia: Secondary | ICD-10-CM | POA: Diagnosis not present

## 2020-12-21 ENCOUNTER — Other Ambulatory Visit: Payer: Self-pay | Admitting: Registered Nurse

## 2020-12-21 DIAGNOSIS — M5136 Other intervertebral disc degeneration, lumbar region: Secondary | ICD-10-CM

## 2020-12-21 DIAGNOSIS — G834 Cauda equina syndrome: Secondary | ICD-10-CM

## 2020-12-21 NOTE — Telephone Encounter (Signed)
PMP was Reviewed. MS Contin and Oxycodone e- scribed today. Placed a call to Ms. Carbon, regarding the above, she verbalizes understanding.

## 2020-12-21 NOTE — Telephone Encounter (Signed)
Refill request from pharmacy for Morphine #60 LF 11/21/20 and Percocet #120 LF 11/21/20. Next appt 01/19/21.

## 2020-12-25 ENCOUNTER — Ambulatory Visit (INDEPENDENT_AMBULATORY_CARE_PROVIDER_SITE_OTHER): Payer: Medicare Other | Admitting: Gastroenterology

## 2020-12-25 ENCOUNTER — Encounter: Payer: Self-pay | Admitting: Gastroenterology

## 2020-12-25 ENCOUNTER — Other Ambulatory Visit: Payer: Self-pay

## 2020-12-25 VITALS — BP 122/72 | HR 88 | Ht 63.75 in | Wt 236.8 lb

## 2020-12-25 DIAGNOSIS — K52832 Lymphocytic colitis: Secondary | ICD-10-CM

## 2020-12-25 DIAGNOSIS — K581 Irritable bowel syndrome with constipation: Secondary | ICD-10-CM

## 2020-12-25 DIAGNOSIS — R109 Unspecified abdominal pain: Secondary | ICD-10-CM | POA: Diagnosis not present

## 2020-12-25 DIAGNOSIS — R1030 Lower abdominal pain, unspecified: Secondary | ICD-10-CM | POA: Diagnosis not present

## 2020-12-25 DIAGNOSIS — K5900 Constipation, unspecified: Secondary | ICD-10-CM | POA: Diagnosis not present

## 2020-12-25 NOTE — Progress Notes (Signed)
Sherry Wong    [id number]    11/30/1955  Primary Care Physician:Skillman, Aundra Millet, PA-C  Referring Physician: Rosalee Kaufman, PA-C [address] [place],  [place] [address]   Chief complaint:  Lymphocytic colitis, constipation  HPI:  Sherry Wong is a very pleasant 65 year old here for follow-up visit for lymphocytic colitis.  She completed taper off budesonide last week.  She is experiencing worsening constipation in the past 2 weeks, she is having a bowel movement only once every few days and has gone as long as a week with no bowel movement. She is currently not taking any laxatives.  She is experiencing lower abdominal discomfort and fullness She is using dicyclomine as needed with improvement of abdominal cramping  Denies any vomiting, melena or rectal bleeding.  Relevant GI history: Colonoscopy December 20, 2019: Right and left colon biopsies consistent with lymphocytic colitis, she was treated with budesonide 9 mg daily for 4-month course.  Developed recurrent diarrhea in November 2021 and was restarted on budesonide 9 mg daily for 3 months, with taper dose  C.diff colitis 08/30/20: Completed 2 weeks of vancomycin    Outpatient Encounter Medications as of 12/25/2020  Medication Sig  . albuterol (PROVENTIL HFA;VENTOLIN HFA) 108 (90 BASE) MCG/ACT inhaler Inhale 2 puffs into the lungs 2 (two) times daily as needed.   . ALPRAZolam (XANAX) 0.25 MG tablet TAKE 1 TABLET BY MOUTH TWICE DAILY AS NEEDED FOR ANXIETY.  . budesonide (ENTOCORT EC) 3 MG 24 hr capsule Take 3 capsules (9 mg total) by mouth daily.  [name] buPROPion (WELLBUTRIN XL) 150 MG 24 hr tablet Take 150 mg by mouth every morning.  . Cholecalciferol (VITAMIN D3) 125 MCG (5000 UT) CAPS Take 2 capsules by mouth daily.  [name] dicyclomine (BENTYL) 20 MG tablet Take 1 tablet (20 mg total) by mouth every 8 (eight) hours as needed for spasms.  . DULoxetine (CYMBALTA) 60 MG capsule Take 60 mg by mouth daily.  .  hyoscyamine (LEVSIN SL) 0.125 MG SL tablet Place 1 tablet (0.125 mg total) under the tongue every 8 (eight) hours as needed.  [name] levothyroxine (SYNTHROID) 175 MCG tablet Take 175 mcg by mouth daily.  [name] morphine (MS CONTIN) 30 MG 12 hr tablet TAKE 1 TABLET EVERY 12 HOURS.  [name] nortriptyline (PAMELOR) 50 MG capsule Take 1 capsule (50 mg total) by mouth at bedtime.  [name] omeprazole (PRILOSEC) 40 MG capsule Take 1 capsule (40 mg total) by mouth daily.  [name] oxyCODONE-acetaminophen (PERCOCET) 10-325 MG tablet TAKE (1) TABLET BY MOUTH EVERY 6 HOURS AS NEEDED FOR PAIN.  . simvastatin (ZOCOR) 20 MG tablet Take 20 mg by mouth daily.  . vitamin B-12 (CYANOCOBALAMIN) 1000 MCG tablet Take 1,000 mcg by mouth daily.   No facility-administered encounter medications on file as of 12/25/2020.    Allergies as of 12/25/2020 - Review Complete 11/21/2020  Allergen Reaction Noted  . Codeine Nausea Only and Other (See Comments) 06/21/2011  . Tegretol [carbamazepine] Other (See Comments) 07/24/2017    Past Medical History:  Diagnosis Date  . Anxiety    takes celexa daily  . Arthritis   . Asthma   . Bronchitis   . Chronic back pain    ddd/lumbago,and radiculopathy  . COVID-17 Sep 2020  . Depression   . Gastric ulcer   . GERD (gastroesophageal reflux disease)    takes Protonix daily  . Gout    hx of  .  H/O hiatal hernia   . Hx of colonic polyps   . Hypertension    takes Losartan daily  . Hypothyroidism    takes Levothyroxine daily  . IBS (irritable bowel syndrome)   . Nocturia   . Pneumonia    hx of 15+yrs ago  . PONV (postoperative nausea and vomiting)   . Ulcerative (chronic) enterocolitis ([place]) 07/2020   Dr. Juliann Mule  . Ulcerative colitis   . Urinary incontinence     Past Surgical History:  Procedure Laterality Date  . BREAST BIOPSY     right  . BREAST LUMPECTOMY Left 2014  . CHOLECYSTECTOMY     "gangrene in small intestines clipped by surgeon" per patient  . COLONOSCOPY  2009   Dr  Rowe Pavy   . cysts removed from ovary    . DILATION AND CURETTAGE OF UTERUS    . ELBOW SURGERY Left   . ESOPHAGOGASTRODUODENOSCOPY    . HEMATOMA EVACUATION N/A 08/25/2013   Procedure: EVACUATION OF LUMBAR HEMATOMA;  Surgeon: Floyce Stakes, MD;  Location: [facility];  Service: Neurosurgery;  Laterality: N/A;  . LUMBAR LAMINECTOMY/DECOMPRESSION MICRODISCECTOMY  12/03/2011   Procedure: LUMBAR LAMINECTOMY/DECOMPRESSION MICRODISCECTOMY 2 LEVELS;  Surgeon: Floyce Stakes, MD;  Location: [place] [facility];  Service: Neurosurgery;  Laterality: Right;  Right Lumbar four-five Lumbar five sacral one Foraminotomies  . LUMBAR WOUND DEBRIDEMENT N/A 09/01/2013   Procedure: LUMBAR WOUND DEBRIDEMENT;  Surgeon: Floyce Stakes, MD;  Location: [facility];  Service: Neurosurgery;  Laterality: N/A;  . PLACEMENT OF LUMBAR DRAIN N/A 09/01/2013   Procedure: PLACEMENT OF LUMBAR DRAIN;  Surgeon: Floyce Stakes, MD;  Location: [facility];  Service: Neurosurgery;  Laterality: N/A;  . TRANSFORAMINAL LUMBAR INTERBODY FUSION (TLIF) WITH PEDICLE SCREW FIXATION 2 LEVEL N/A 08/25/2013   Procedure:  Lumbar four-five, Lumbar five-Sacral one Transforaminal Lumbar Interbody Fusion ;  Surgeon: Floyce Stakes, MD;  Location: [place] [facility];  Service: Neurosurgery;  Laterality: N/A;    Family History  Problem Relation Age of Onset  . Anesthesia problems Mother   . COPD Mother   . Diabetes Mother   . Anesthesia problems Sister   . Hypotension Neg Hx   . Malignant hyperthermia Neg Hx   . Pseudochol deficiency Neg Hx   . Colon cancer Neg Hx   . Esophageal cancer Neg Hx   . Liver disease Neg Hx   . Pancreatic cancer Neg Hx     Social History   Socioeconomic History  . Marital status: Married    Spouse name: Not on file  . Number of children: 0  . Years of education: 48  . Highest education level: Not on file  Occupational History  . Occupation: Disabled  Tobacco Use  . Smoking status: Never Smoker  . Smokeless  tobacco: Never Used  Vaping Use  . Vaping Use: Never used  Substance and Sexual Activity  . Alcohol use: Yes    Alcohol/week: 0.0 standard drinks    Comment: occ- 0.5 can beer every 2 months  . Drug use: No  . Sexual activity: Not Currently  Other Topics Concern  . Not on file  Social History Narrative   Lives at home with husband.   Caffeine use: 1 cup coffee 3 times per week.    Social Determinants of Health   Financial Resource Strain: Not on file  Food Insecurity: Not on file  Transportation Needs: Not on file  Physical Activity: Not on file  Stress: Not on  file  Social Connections: Not on file  Intimate Partner Violence: Not on file      Review of systems: All other review of systems negative except as mentioned in the HPI.   Physical Exam: Vitals:   12/25/20 1403  BP: 122/72  Pulse: 88   Body mass index is 40.97 kg/m. Gen:      No acute distress HEENT:  sclera anicteric Abd:      soft, non-tender; no palpable masses, no distension, abdominal distention, tympanic Ext:    No edema Neuro: alert and oriented x 3 Psych: normal mood and affect  Data Reviewed:  Reviewed labs, radiology imaging, old records and pertinent past GI work up   Assessment and Plan/Recommendations:  65 year old very pleasant female here for follow-up visit for lymphocytic colitis.she no longer has diarrhea but is experiencing constipation in the past 2 weeks.  She is off budesonide taper  Advised patient to do MiraLAX purge and start daily 1 capful MiraLAX after bowel purge to prevent recurrent constipation Increase dietary fiber and water intake Titrate the dose of MiraLAX to have 1-2 soft bowel movements daily If continues to have persistent symptoms, will consider prescription laxative for further management of constipation  Use dicyclomine 20 mg every 8 hours as needed for abdominal cramping or discomfort  Return in 6 to 8 weeks or sooner if needed  The patient was provided  an opportunity to ask questions and all were answered. The patient agreed with the plan and demonstrated an understanding of the instructions.  Damaris Hippo , MD    CC: Rosalee Kaufman, *

## 2020-12-25 NOTE — Patient Instructions (Signed)
Dr Silverio Decamp recommends that you complete a bowel purge (to clean out your bowels). Please do the following: Purchase a bottle of Miralax over the counter as well as a box of 5 mg dulcolax tablets. Take 4 dulcolax tablets. Wait 1 hour. You will then drink 6-8 capfuls of Miralax mixed in an adequate amount of water/juice/gatorade (you may choose which of these liquids to drink) over the next 2-3 hours. You should expect results within 1 to 6 hours after completing the bowel purge.  Start Miralax 1 capful daily after completing  bowel purge   We will refill Dicyclomine to your pharmacy  Due to recent changes in healthcare laws, you may see the results of your imaging and laboratory studies on MyChart before your provider has had a chance to review them.  We understand that in some cases there may be results that are confusing or concerning to you. Not all laboratory results come back in the same time frame and the provider may be waiting for multiple results in order to interpret others.  Please give [place] 48 hours in order for your provider to thoroughly review all the results before contacting the office for clarification of your results.   I appreciate the  opportunity to care for you  Thank You   Harl Bowie , MD

## 2020-12-27 ENCOUNTER — Other Ambulatory Visit: Payer: Self-pay | Admitting: Gastroenterology

## 2021-01-15 DIAGNOSIS — E039 Hypothyroidism, unspecified: Secondary | ICD-10-CM | POA: Diagnosis not present

## 2021-01-19 ENCOUNTER — Other Ambulatory Visit: Payer: Self-pay

## 2021-01-19 ENCOUNTER — Encounter: Payer: Self-pay | Admitting: Registered Nurse

## 2021-01-19 ENCOUNTER — Encounter: Payer: Medicare Other | Attending: Physical Medicine and Rehabilitation | Admitting: Registered Nurse

## 2021-01-19 VITALS — BP 121/79 | HR 85 | Temp 98.6°F | Ht 63.75 in | Wt 229.4 lb

## 2021-01-19 DIAGNOSIS — Z79891 Long term (current) use of opiate analgesic: Secondary | ICD-10-CM | POA: Diagnosis not present

## 2021-01-19 DIAGNOSIS — M5416 Radiculopathy, lumbar region: Secondary | ICD-10-CM

## 2021-01-19 DIAGNOSIS — M7061 Trochanteric bursitis, right hip: Secondary | ICD-10-CM | POA: Diagnosis not present

## 2021-01-19 DIAGNOSIS — M5136 Other intervertebral disc degeneration, lumbar region: Secondary | ICD-10-CM | POA: Insufficient documentation

## 2021-01-19 DIAGNOSIS — G834 Cauda equina syndrome: Secondary | ICD-10-CM | POA: Diagnosis not present

## 2021-01-19 DIAGNOSIS — F3289 Other specified depressive episodes: Secondary | ICD-10-CM

## 2021-01-19 DIAGNOSIS — M7062 Trochanteric bursitis, left hip: Secondary | ICD-10-CM

## 2021-01-19 DIAGNOSIS — M961 Postlaminectomy syndrome, not elsewhere classified: Secondary | ICD-10-CM | POA: Diagnosis not present

## 2021-01-19 DIAGNOSIS — F411 Generalized anxiety disorder: Secondary | ICD-10-CM

## 2021-01-19 DIAGNOSIS — Z5181 Encounter for therapeutic drug level monitoring: Secondary | ICD-10-CM

## 2021-01-19 DIAGNOSIS — G894 Chronic pain syndrome: Secondary | ICD-10-CM | POA: Diagnosis not present

## 2021-01-19 DIAGNOSIS — M51369 Other intervertebral disc degeneration, lumbar region without mention of lumbar back pain or lower extremity pain: Secondary | ICD-10-CM

## 2021-01-19 MED ORDER — MORPHINE SULFATE ER 30 MG PO TBCR: EXTENDED_RELEASE_TABLET | ORAL | 0 refills | Status: DC

## 2021-01-19 MED ORDER — ALPRAZOLAM 0.25 MG PO TABS: ORAL_TABLET | ORAL | 2 refills | Status: DC

## 2021-01-19 MED ORDER — OXYCODONE-ACETAMINOPHEN 10-325 MG PO TABS: ORAL_TABLET | ORAL | 0 refills | Status: DC

## 2021-01-19 MED ORDER — NORTRIPTYLINE HCL 50 MG PO CAPS: 50.0000 mg | ORAL_CAPSULE | Freq: Every day | ORAL | 3 refills | Status: DC

## 2021-01-19 NOTE — Progress Notes (Signed)
Subjective:    Patient ID: Sherry Wong, female    DOB: Nov 12, 1955, 65 y.o.   MRN: [id number]  HPI: Sherry Wong is a 65 y.o. female who returns for follow up appointment for chronic pain and medication refill. She states her pain is located in her lower back pain radiating into her bilateral hips, bilateral lower extremities and bilateral feet with tingling and burning. She rates her pain 5. Her current exercise regime is walking, going to the [facility] three days a week and performing stretching exercises.  Sherry Wong Morphine equivalent is 120.00 MME. She is also prescribed Alprazolam .We have discussed the black box warning of using opioids and benzodiazepines. I highlighted the dangers of using these drugs together and discussed the adverse events including respiratory suppression, overdose, cognitive impairment and importance of compliance with current regimen. We will continue to monitor and adjust as indicated.     Last UDS was Performed on 11/21/2020, it was consistent.   Pain Inventory Average Pain 7 Pain Right Now 5 My pain is sharp, burning, dull and aching  In the last 24 hours, has pain interfered with the following? General activity 9 Relation with others 9 Enjoyment of life 9 What TIME of day is your pain at its worst? morning , daytime, evening and night Sleep (in general) Fair  Pain is worse with: walking, bending, standing and some activites Pain improves with: rest, heat/ice and medication Relief from Meds: 5  Family History  Problem Relation Age of Onset  . Anesthesia problems Mother   . COPD Mother   . Diabetes Mother   . Anesthesia problems Sister   . Hypotension Neg Hx   . Malignant hyperthermia Neg Hx   . Pseudochol deficiency Neg Hx   . Colon cancer Neg Hx   . Esophageal cancer Neg Hx   . Liver disease Neg Hx   . Pancreatic cancer Neg Hx    Social History   Socioeconomic History  . Marital status: Married    Spouse name: Not on file  . Number  of children: 0  . Years of education: 64  . Highest education level: Not on file  Occupational History  . Occupation: Disabled  Tobacco Use  . Smoking status: Never Smoker  . Smokeless tobacco: Never Used  Vaping Use  . Vaping Use: Never used  Substance and Sexual Activity  . Alcohol use: Yes    Alcohol/week: 0.0 standard drinks    Comment: occ- 0.5 can beer every 2 months  . Drug use: No  . Sexual activity: Not Currently  Other Topics Concern  . Not on file  Social History Narrative   Lives at home with husband.   Caffeine use: 1 cup coffee 3 times per week.    Social Determinants of Health   Financial Resource Strain: Not on file  Food Insecurity: Not on file  Transportation Needs: Not on file  Physical Activity: Not on file  Stress: Not on file  Social Connections: Not on file   Past Surgical History:  Procedure Laterality Date  . BREAST BIOPSY     right  . BREAST LUMPECTOMY Left 2014  . CHOLECYSTECTOMY     "gangrene in small intestines clipped by surgeon" per patient  . COLONOSCOPY  2009   Dr Rowe Pavy   . cysts removed from ovary    . DILATION AND CURETTAGE OF UTERUS    . ELBOW SURGERY Left   . ESOPHAGOGASTRODUODENOSCOPY    . HEMATOMA EVACUATION N/A  08/25/2013   Procedure: EVACUATION OF LUMBAR HEMATOMA;  Surgeon: Floyce Stakes, MD;  Location: [facility] [facility];  Service: Neurosurgery;  Laterality: N/A;  . LUMBAR LAMINECTOMY/DECOMPRESSION MICRODISCECTOMY  12/03/2011   Procedure: LUMBAR LAMINECTOMY/DECOMPRESSION MICRODISCECTOMY 2 LEVELS;  Surgeon: Floyce Stakes, MD;  Location: [place] [facility];  Service: Neurosurgery;  Laterality: Right;  Right Lumbar four-five Lumbar five sacral one Foraminotomies  . LUMBAR WOUND DEBRIDEMENT N/A 09/01/2013   Procedure: LUMBAR WOUND DEBRIDEMENT;  Surgeon: Floyce Stakes, MD;  Location: [facility];  Service: Neurosurgery;  Laterality: N/A;  . PLACEMENT OF LUMBAR DRAIN N/A 09/01/2013   Procedure: PLACEMENT OF LUMBAR DRAIN;  Surgeon:  Floyce Stakes, MD;  Location: [facility];  Service: Neurosurgery;  Laterality: N/A;  . TRANSFORAMINAL LUMBAR INTERBODY FUSION (TLIF) WITH PEDICLE SCREW FIXATION 2 LEVEL N/A 08/25/2013   Procedure:  Lumbar four-five, Lumbar five-Sacral one Transforaminal Lumbar Interbody Fusion ;  Surgeon: Floyce Stakes, MD;  Location: [place] [facility];  Service: Neurosurgery;  Laterality: N/A;   Past Surgical History:  Procedure Laterality Date  . BREAST BIOPSY     right  . BREAST LUMPECTOMY Left 2014  . CHOLECYSTECTOMY     "gangrene in small intestines clipped by surgeon" per patient  . COLONOSCOPY  2009   Dr Rowe Pavy   . cysts removed from ovary    . DILATION AND CURETTAGE OF UTERUS    . ELBOW SURGERY Left   . ESOPHAGOGASTRODUODENOSCOPY    . HEMATOMA EVACUATION N/A 08/25/2013   Procedure: EVACUATION OF LUMBAR HEMATOMA;  Surgeon: Floyce Stakes, MD;  Location: [facility];  Service: Neurosurgery;  Laterality: N/A;  . LUMBAR LAMINECTOMY/DECOMPRESSION MICRODISCECTOMY  12/03/2011   Procedure: LUMBAR LAMINECTOMY/DECOMPRESSION MICRODISCECTOMY 2 LEVELS;  Surgeon: Floyce Stakes, MD;  Location: [place] [facility];  Service: Neurosurgery;  Laterality: Right;  Right Lumbar four-five Lumbar five sacral one Foraminotomies  . LUMBAR WOUND DEBRIDEMENT N/A 09/01/2013   Procedure: LUMBAR WOUND DEBRIDEMENT;  Surgeon: Floyce Stakes, MD;  Location: [facility];  Service: Neurosurgery;  Laterality: N/A;  . PLACEMENT OF LUMBAR DRAIN N/A 09/01/2013   Procedure: PLACEMENT OF LUMBAR DRAIN;  Surgeon: Floyce Stakes, MD;  Location: [facility];  Service: Neurosurgery;  Laterality: N/A;  . TRANSFORAMINAL LUMBAR INTERBODY FUSION (TLIF) WITH PEDICLE SCREW FIXATION 2 LEVEL N/A 08/25/2013   Procedure:  Lumbar four-five, Lumbar five-Sacral one Transforaminal Lumbar Interbody Fusion ;  Surgeon: Floyce Stakes, MD;  Location: [place] [facility];  Service: Neurosurgery;  Laterality: N/A;   Past Medical History:  Diagnosis Date  .  Anxiety    takes celexa daily  . Arthritis   . Asthma   . Bronchitis   . Chronic back pain    ddd/lumbago,and radiculopathy  . COVID-17 Sep 2020  . Depression   . Gastric ulcer   . GERD (gastroesophageal reflux disease)    takes Protonix daily  . Gout    hx of  . H/O hiatal hernia   . Hx of colonic polyps   . Hypertension    takes Losartan daily  . Hypothyroidism    takes Levothyroxine daily  . IBS (irritable bowel syndrome)   . Nocturia   . Pneumonia    hx of 15+yrs ago  . PONV (postoperative nausea and vomiting)   . Ulcerative (chronic) enterocolitis ([place]) 07/2020   Dr. Juliann Mule  . Ulcerative colitis   . Urinary incontinence    BP 121/79   Pulse 85  Temp 98.6 F (37 C)   Ht 5' 3.75" (1.619 m)   Wt 229 lb 6.4 oz (104.1 kg)   SpO2 98%   BMI 39.69 kg/m   Opioid Risk Score:   Fall Risk Score:  `1  Depression screen PHQ 2/9  Depression screen [facility] 2/9 01/19/2021 11/21/2020 05/23/2020 03/21/2020 03/24/2019 01/26/2019 07/20/2018  Decreased Interest 0 0 1 0 1 1 1   Down, Depressed, Hopeless 0 0 1 0 1 1 1   PHQ - 2 Score 0 0 2 0 2 2 2   Altered sleeping - - - 0 - - -  Tired, decreased energy - - - 0 - - -  Change in appetite - - - 0 - - -  Feeling bad or failure about yourself  - - - 0 - - -  Trouble concentrating - - - 0 - - -  Moving slowly or fidgety/restless - - - 0 - - -  Suicidal thoughts - - - 0 - - -  PHQ-9 Score - - - 0 - - -   Review of Systems  Constitutional: Negative.   HENT: Negative.   Eyes: Negative.   Respiratory: Negative.   Cardiovascular: Negative.   Gastrointestinal: Negative.   Endocrine: Negative.   Genitourinary: Negative.   Musculoskeletal: Positive for arthralgias, back pain and gait problem.  Skin: Negative.   Allergic/Immunologic: Negative.   Hematological: Negative.   Psychiatric/Behavioral: Negative.   All other systems reviewed and are negative.      Objective:   Physical Exam Vitals and nursing note reviewed.   Constitutional:      Appearance: Normal appearance.  Cardiovascular:     Rate and Rhythm: Normal rate and regular rhythm.     Pulses: Normal pulses.     Heart sounds: Normal heart sounds.  Pulmonary:     Effort: Pulmonary effort is normal.     Breath sounds: Normal breath sounds.  Musculoskeletal:     Cervical back: Normal range of motion and neck supple.     Comments: Normal Muscle Bulk and Muscle Testing Reveals:  Upper Extremities:Full  ROM and Muscle Strength 5/5 Lumbar Hypersensitivity Bilateral Greater Trochanter Tenderness Lower Extremities: Full ROM and Muscle Strength 5/5 Arises from chair slowly using cane for support Narrow Based Gait   Skin:    General: Skin is warm and dry.  Neurological:     Mental Status: She is alert and oriented to person, place, and time.  Psychiatric:        Mood and Affect: Mood normal.        Behavior: Behavior normal.           Assessment & Plan:  1. Cauda equina syndrome: Incontinence: Wearing Depends:Continue to Monitor.01/19/2021. 2. Insomnia: Continue with Nortriptyline.01/19/2021 3. 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 Second scripts sent for the following month We will continue the opioid monitoring program, this consists of regular clinic visits, examinations, urine drug screen, pill counts as well as use of [place] Controlled Substance Reporting system. A 12 month History has been reviewed on the [place] Controlled Substance Reporting Systemon04/22/2022. 4. Depression: Continue withCymbalta. Continue to Monitor.01/19/2021 5. Anxiety: Continue withcurrent medication regimen withXanax. Continue to Monitor.01/19/2021 6.Neuropathy:Continue:with current medication nregimen with  Pamelor.01/19/2021 7. Constipation: Continue Senokot.Continue to Monitor.01/19/2021 8.Muscle Spasm: Continue Baclofen.Continue  to Monitor.01/19/2021 9. Lumbar Radiculitis: Continue Pamelor.Continue to Monitor.01/19/2021 10. Bilateral Feet Pain with tingling; . Continue current medication regimen. Continue to monitor.   F/U in 2 months

## 2021-01-27 DIAGNOSIS — E876 Hypokalemia: Secondary | ICD-10-CM | POA: Diagnosis not present

## 2021-01-27 DIAGNOSIS — E7849 Other hyperlipidemia: Secondary | ICD-10-CM | POA: Diagnosis not present

## 2021-01-27 DIAGNOSIS — I10 Essential (primary) hypertension: Secondary | ICD-10-CM | POA: Diagnosis not present

## 2021-01-27 DIAGNOSIS — K219 Gastro-esophageal reflux disease without esophagitis: Secondary | ICD-10-CM | POA: Diagnosis not present

## 2021-02-09 ENCOUNTER — Ambulatory Visit: Payer: Medicare Other | Admitting: Gastroenterology

## 2021-02-27 DIAGNOSIS — E039 Hypothyroidism, unspecified: Secondary | ICD-10-CM | POA: Diagnosis not present

## 2021-02-27 DIAGNOSIS — R5383 Other fatigue: Secondary | ICD-10-CM | POA: Diagnosis not present

## 2021-03-02 DIAGNOSIS — E7849 Other hyperlipidemia: Secondary | ICD-10-CM | POA: Diagnosis not present

## 2021-03-02 DIAGNOSIS — Z0001 Encounter for general adult medical examination with abnormal findings: Secondary | ICD-10-CM | POA: Diagnosis not present

## 2021-03-02 DIAGNOSIS — K580 Irritable bowel syndrome with diarrhea: Secondary | ICD-10-CM | POA: Diagnosis not present

## 2021-03-02 DIAGNOSIS — Z23 Encounter for immunization: Secondary | ICD-10-CM | POA: Diagnosis not present

## 2021-03-02 DIAGNOSIS — Z1389 Encounter for screening for other disorder: Secondary | ICD-10-CM | POA: Diagnosis not present

## 2021-03-02 DIAGNOSIS — R609 Edema, unspecified: Secondary | ICD-10-CM | POA: Diagnosis not present

## 2021-03-02 DIAGNOSIS — E039 Hypothyroidism, unspecified: Secondary | ICD-10-CM | POA: Diagnosis not present

## 2021-03-22 ENCOUNTER — Ambulatory Visit: Payer: Medicare Other | Admitting: Gastroenterology

## 2021-03-22 ENCOUNTER — Encounter: Payer: Medicare Other | Attending: Physical Medicine and Rehabilitation | Admitting: Registered Nurse

## 2021-03-22 ENCOUNTER — Other Ambulatory Visit: Payer: Self-pay

## 2021-03-22 ENCOUNTER — Encounter: Payer: Self-pay | Admitting: Registered Nurse

## 2021-03-22 VITALS — BP 137/85 | HR 79 | Temp 98.6°F | Ht 63.75 in | Wt 230.6 lb

## 2021-03-22 DIAGNOSIS — M7062 Trochanteric bursitis, left hip: Secondary | ICD-10-CM | POA: Insufficient documentation

## 2021-03-22 DIAGNOSIS — F411 Generalized anxiety disorder: Secondary | ICD-10-CM | POA: Insufficient documentation

## 2021-03-22 DIAGNOSIS — M5136 Other intervertebral disc degeneration, lumbar region: Secondary | ICD-10-CM | POA: Diagnosis not present

## 2021-03-22 DIAGNOSIS — Z5181 Encounter for therapeutic drug level monitoring: Secondary | ICD-10-CM | POA: Insufficient documentation

## 2021-03-22 DIAGNOSIS — G834 Cauda equina syndrome: Secondary | ICD-10-CM | POA: Insufficient documentation

## 2021-03-22 DIAGNOSIS — G894 Chronic pain syndrome: Secondary | ICD-10-CM | POA: Insufficient documentation

## 2021-03-22 DIAGNOSIS — F3289 Other specified depressive episodes: Secondary | ICD-10-CM | POA: Insufficient documentation

## 2021-03-22 DIAGNOSIS — M961 Postlaminectomy syndrome, not elsewhere classified: Secondary | ICD-10-CM | POA: Insufficient documentation

## 2021-03-22 DIAGNOSIS — M5416 Radiculopathy, lumbar region: Secondary | ICD-10-CM | POA: Diagnosis not present

## 2021-03-22 DIAGNOSIS — Z79891 Long term (current) use of opiate analgesic: Secondary | ICD-10-CM | POA: Diagnosis not present

## 2021-03-22 DIAGNOSIS — M7061 Trochanteric bursitis, right hip: Secondary | ICD-10-CM | POA: Diagnosis not present

## 2021-03-22 MED ORDER — OXYCODONE-ACETAMINOPHEN 10-325 MG PO TABS: ORAL_TABLET | ORAL | 0 refills | Status: DC

## 2021-03-22 MED ORDER — MORPHINE SULFATE ER 30 MG PO TBCR: EXTENDED_RELEASE_TABLET | ORAL | 0 refills | Status: DC

## 2021-03-22 MED ORDER — ALPRAZOLAM 0.25 MG PO TABS: ORAL_TABLET | ORAL | 2 refills | Status: DC

## 2021-03-22 NOTE — Progress Notes (Signed)
Subjective:    Patient ID: Sherry Wong, female    DOB: 12-03-1955, 65 y.o.   MRN: [id number]  HPI: Sherry Wong is a 65 y.o. female who returns for follow up appointment for chronic pain and medication refill. She states her pain is located in her lower back radiating into her bilateral hips, bilateral lower extremities and bilateral feet with tingling and numbness. She rates her pain 5. Her current exercise regime is walking, attending [employer] three days a week, she is using the elliptical and stationary bike for 25 minutes and performing stretching exercises.  Ms. Scobey Morphine equivalent is 120.00  MME.   UDS ordered today.     Pain Inventory Average Pain 7 Pain Right Now 5 My pain is constant, sharp, burning, dull, stabbing, tingling, and aching  In the last 24 hours, has pain interfered with the following? General activity 9 Relation with others 9 Enjoyment of life 9 What TIME of day is your pain at its worst? morning , daytime, evening, and night Sleep (in general) Fair  Pain is worse with: walking, bending, sitting, standing, and some activites Pain improves with: rest, heat/ice, pacing activities, and medication Relief from Meds: 5  Family History  Problem Relation Age of Onset   Anesthesia problems Mother    COPD Mother    Diabetes Mother    Anesthesia problems Sister    Hypotension Neg Hx    Malignant hyperthermia Neg Hx    Pseudochol deficiency Neg Hx    Colon cancer Neg Hx    Esophageal cancer Neg Hx    Liver disease Neg Hx    Pancreatic cancer Neg Hx    Social History   Socioeconomic History   Marital status: Married    Spouse name: Not on file   Number of children: 0   Years of education: 12   Highest education level: Not on file  Occupational History   Occupation: Disabled  Tobacco Use   Smoking status: Never   Smokeless tobacco: Never  Vaping Use   Vaping Use: Never used  Substance and Sexual Activity   Alcohol use: Yes    Alcohol/week:  0.0 standard drinks    Comment: occ- 0.5 can beer every 2 months   Drug use: No   Sexual activity: Not Currently  Other Topics Concern   Not on file  Social History Narrative   Lives at home with husband.   Caffeine use: 1 cup coffee 3 times per week.    Social Determinants of Health   Financial Resource Strain: Not on file  Food Insecurity: Not on file  Transportation Needs: Not on file  Physical Activity: Not on file  Stress: Not on file  Social Connections: Not on file   Past Surgical History:  Procedure Laterality Date   BREAST BIOPSY     right   BREAST LUMPECTOMY Left 2014   CHOLECYSTECTOMY     "gangrene in small intestines clipped by surgeon" per patient   COLONOSCOPY  2009   Dr Rowe Pavy    cysts removed from ovary     [place] Left    ESOPHAGOGASTRODUODENOSCOPY     HEMATOMA EVACUATION N/A 08/25/2013   Procedure: EVACUATION OF LUMBAR HEMATOMA;  Surgeon: Floyce Stakes, MD;  Location: [facility];  Service: Neurosurgery;  Laterality: N/A;   LUMBAR LAMINECTOMY/DECOMPRESSION MICRODISCECTOMY  12/03/2011   Procedure: LUMBAR LAMINECTOMY/DECOMPRESSION MICRODISCECTOMY 2 LEVELS;  Surgeon: Floyce Stakes, MD;  Location: [place] [facility];  Service: Neurosurgery;  Laterality: Right;  Right Lumbar four-five Lumbar five sacral one Foraminotomies   LUMBAR WOUND DEBRIDEMENT N/A 09/01/2013   Procedure: LUMBAR WOUND DEBRIDEMENT;  Surgeon: Floyce Stakes, MD;  Location: [facility];  Service: Neurosurgery;  Laterality: N/A;   PLACEMENT OF LUMBAR DRAIN N/A 09/01/2013   Procedure: PLACEMENT OF LUMBAR DRAIN;  Surgeon: Floyce Stakes, MD;  Location: [facility];  Service: Neurosurgery;  Laterality: N/A;   TRANSFORAMINAL LUMBAR INTERBODY FUSION (TLIF) WITH PEDICLE SCREW FIXATION 2 LEVEL N/A 08/25/2013   Procedure:  Lumbar four-five, Lumbar five-Sacral one Transforaminal Lumbar Interbody Fusion ;  Surgeon: Floyce Stakes, MD;  Location: [place] [facility];   Service: Neurosurgery;  Laterality: N/A;   Past Surgical History:  Procedure Laterality Date   BREAST BIOPSY     right   BREAST LUMPECTOMY Left 2014   CHOLECYSTECTOMY     "gangrene in small intestines clipped by surgeon" per patient   COLONOSCOPY  2009   Dr Rowe Pavy    cysts removed from ovary     DILATION AND CURETTAGE OF UTERUS     ELBOW SURGERY Left    ESOPHAGOGASTRODUODENOSCOPY     HEMATOMA EVACUATION N/A 08/25/2013   Procedure: EVACUATION OF LUMBAR HEMATOMA;  Surgeon: Floyce Stakes, MD;  Location: [facility];  Service: Neurosurgery;  Laterality: N/A;   LUMBAR LAMINECTOMY/DECOMPRESSION MICRODISCECTOMY  12/03/2011   Procedure: LUMBAR LAMINECTOMY/DECOMPRESSION MICRODISCECTOMY 2 LEVELS;  Surgeon: Floyce Stakes, MD;  Location: [place] [facility];  Service: Neurosurgery;  Laterality: Right;  Right Lumbar four-five Lumbar five sacral one Foraminotomies   LUMBAR WOUND DEBRIDEMENT N/A 09/01/2013   Procedure: LUMBAR WOUND DEBRIDEMENT;  Surgeon: Floyce Stakes, MD;  Location: [facility];  Service: Neurosurgery;  Laterality: N/A;   PLACEMENT OF LUMBAR DRAIN N/A 09/01/2013   Procedure: PLACEMENT OF LUMBAR DRAIN;  Surgeon: Floyce Stakes, MD;  Location: [facility];  Service: Neurosurgery;  Laterality: N/A;   TRANSFORAMINAL LUMBAR INTERBODY FUSION (TLIF) WITH PEDICLE SCREW FIXATION 2 LEVEL N/A 08/25/2013   Procedure:  Lumbar four-five, Lumbar five-Sacral one Transforaminal Lumbar Interbody Fusion ;  Surgeon: Floyce Stakes, MD;  Location: [place] [facility];  Service: Neurosurgery;  Laterality: N/A;   Past Medical History:  Diagnosis Date   Anxiety    takes celexa daily   Arthritis    Asthma    Bronchitis    Chronic back pain    ddd/lumbago,and radiculopathy   COVID-17 Sep 2020   Depression    Gastric ulcer    GERD (gastroesophageal reflux disease)    takes Protonix daily   Gout    hx of   H/O hiatal hernia    Hx of colonic polyps    Hypertension    takes Losartan daily    Hypothyroidism    takes Levothyroxine daily   IBS (irritable bowel syndrome)    Nocturia    Pneumonia    hx of 15+yrs ago   PONV (postoperative nausea and vomiting)    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 Juliann Mule   Ulcerative colitis    Urinary incontinence    BP 137/85   Pulse 79   Temp 98.6 F (37 C)   Ht 5' 3.75" (1.619 m)   Wt 230 lb 9.6 oz (104.6 kg)   SpO2 96%   BMI 39.89 kg/m   Opioid Risk Score:   Fall Risk Score:  `1  Depression screen [facility] 2/9  Depression screen [facility] 2/9 03/22/2021 01/19/2021 11/21/2020 05/23/2020 03/21/2020 03/24/2019 01/26/2019  Decreased Interest 1 0 0 1 0 1 1  Down, Depressed, Hopeless 1 0 0 1 0 1 1  PHQ - 2 Score 2 0 0 2 0 2 2  Altered sleeping - - - - 0 - -  Tired, decreased energy - - - - 0 - -  Change in appetite - - - - 0 - -  Feeling bad or failure about yourself  - - - - 0 - -  Trouble concentrating - - - - 0 - -  Moving slowly or fidgety/restless - - - - 0 - -  Suicidal thoughts - - - - 0 - -  PHQ-9 Score - - - - 0 - -     Review of Systems  Constitutional: Negative.   HENT: Negative.    Eyes: Negative.   Respiratory: Negative.    Cardiovascular: Negative.   Gastrointestinal: Negative.   Endocrine: Negative.   Genitourinary: Negative.   Musculoskeletal:  Positive for back pain and gait problem.  Skin: Negative.   Allergic/Immunologic: Negative.   Hematological: Negative.   Psychiatric/Behavioral: Negative.    All other systems reviewed and are negative.     Objective:   Physical Exam Vitals and nursing note reviewed.  Constitutional:      Appearance: Normal appearance.  Cardiovascular:     Rate and Rhythm: Normal rate and regular rhythm.     Pulses: Normal pulses.     Heart sounds: Normal heart sounds.  Pulmonary:     Effort: Pulmonary effort is normal.     Breath sounds: Normal breath sounds.  Musculoskeletal:     Cervical back: Normal range of motion and neck supple.     Comments: Normal Muscle  Bulk and Muscle Testing Reveals:  Upper Extremities: Full ROM and Muscle Strength 5/5  Lumbar Paraspinal Tenderness: L-3-L-5 Bilateral Greater Trochanter Tenderness Lower Extremities : Full ROM and Muscle Strength 5/5 Arises from Table slowly using cane for support Antalgic Gait     Skin:    General: Skin is warm and dry.  Neurological:     Mental Status: She is alert and oriented to person, place, and time.  Psychiatric:        Mood and Affect: Mood normal.        Behavior: Behavior normal.         Assessment & Plan:  1. Cauda equina syndrome: Incontinence: Wearing Depends:Continue to Monitor. 03/22/2021. 2. Insomnia: Continue with Nortriptyline. 03/22/2021 3. 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 Continue current medication regimen. 03/22/2021 Refilled:  Percocet 10/325 mg #120--use 1 tablet every 6 hours as needed and  MS Contin 30 mg one tablet every 12 hours as needed #60. Second scripts sent for the following month We will continue the opioid monitoring program, this consists of regular clinic visits, examinations, urine drug screen, pill counts as well as use of [place] Controlled Substance Reporting system. A 12 month History has been reviewed on the [facility] Controlled Substance Reporting System on 04623/2022. 4. Depression: Continue with Cymbalta. Continue to Monitor. 01/19/2021 5. Anxiety: Continue with current medication regimen with Xanax. Continue to Monitor. 03/22/2021 6.Neuropathy:Continue:with current medication nregimen with  Pamelor. 03/22/2021 7. Constipation: Continue Senokot. Continue to Monitor. 03/22/2021 8.Muscle Spasm: Continue Baclofen.Continue to Monitor. 03/22/2021  9. Lumbar Radiculitis: Continue Pamelor. Continue to Monitor. 03/22/2021 10. Bilateral Feet Pain with tingling; . Continue current medication regimen.  Continue to monitor. 03/22/2021    F/U in 2 months

## 2021-03-26 LAB — DRUG TOX MONITOR 1 W/CONF, ORAL FLD
Amphetamines: NEGATIVE ng/mL (ref <10)
Barbiturates: NEGATIVE ng/mL (ref <10)
Benzodiazepines: NEGATIVE ng/mL (ref <0.50)
Buprenorphine: NEGATIVE ng/mL (ref <0.10)
Cocaine: NEGATIVE ng/mL (ref <5.0)
Codeine: NEGATIVE ng/mL (ref <2.5)
Dihydrocodeine: NEGATIVE ng/mL (ref <2.5)
Fentanyl: NEGATIVE ng/mL (ref <0.10)
Heroin Metabolite: NEGATIVE ng/mL (ref <1.0)
Hydrocodone: NEGATIVE ng/mL (ref <2.5)
Hydromorphone: NEGATIVE ng/mL (ref <2.5)
MARIJUANA: NEGATIVE ng/mL (ref <2.5)
MDMA: NEGATIVE ng/mL (ref <10)
Meprobamate: NEGATIVE ng/mL (ref <2.5)
Methadone: NEGATIVE ng/mL (ref <5.0)
Morphine: 5 ng/mL — ABNORMAL HIGH (ref <2.5)
Nicotine Metabolite: NEGATIVE ng/mL (ref <5.0)
Norhydrocodone: NEGATIVE ng/mL (ref <2.5)
Noroxycodone: 11.5 ng/mL — ABNORMAL HIGH (ref <2.5)
Opiates: POSITIVE ng/mL — AB (ref <2.5)
Oxycodone: 33.1 ng/mL — ABNORMAL HIGH (ref <2.5)
Oxymorphone: NEGATIVE ng/mL (ref <2.5)
Phencyclidine: NEGATIVE ng/mL (ref <10)
Tapentadol: NEGATIVE ng/mL (ref <5.0)
Tramadol: NEGATIVE ng/mL (ref <5.0)
Zolpidem: NEGATIVE ng/mL (ref <5.0)

## 2021-03-26 LAB — DRUG TOX ALC METAB W/CON, ORAL FLD: Alcohol Metabolite: NEGATIVE ng/mL (ref <25)

## 2021-03-30 ENCOUNTER — Telehealth: Payer: Self-pay | Admitting: *Deleted

## 2021-03-30 NOTE — Telephone Encounter (Signed)
Oral swab drug screen was consistent for prescribed medications.  ?

## 2021-04-03 ENCOUNTER — Other Ambulatory Visit: Payer: Self-pay | Admitting: Registered Nurse

## 2021-04-29 DIAGNOSIS — E7849 Other hyperlipidemia: Secondary | ICD-10-CM | POA: Diagnosis not present

## 2021-04-29 DIAGNOSIS — E876 Hypokalemia: Secondary | ICD-10-CM | POA: Diagnosis not present

## 2021-04-29 DIAGNOSIS — I10 Essential (primary) hypertension: Secondary | ICD-10-CM | POA: Diagnosis not present

## 2021-04-29 DIAGNOSIS — K219 Gastro-esophageal reflux disease without esophagitis: Secondary | ICD-10-CM | POA: Diagnosis not present

## 2021-05-21 ENCOUNTER — Encounter: Payer: Medicare Other | Admitting: Registered Nurse

## 2021-05-24 ENCOUNTER — Other Ambulatory Visit: Payer: Self-pay

## 2021-05-24 ENCOUNTER — Encounter: Payer: Self-pay | Admitting: Registered Nurse

## 2021-05-24 ENCOUNTER — Encounter: Payer: Medicare Other | Attending: Physical Medicine and Rehabilitation | Admitting: Registered Nurse

## 2021-05-24 VITALS — BP 131/83 | HR 90 | Temp 98.3°F | Ht 63.75 in | Wt 221.0 lb

## 2021-05-24 DIAGNOSIS — M5416 Radiculopathy, lumbar region: Secondary | ICD-10-CM

## 2021-05-24 DIAGNOSIS — M961 Postlaminectomy syndrome, not elsewhere classified: Secondary | ICD-10-CM

## 2021-05-24 DIAGNOSIS — M5136 Other intervertebral disc degeneration, lumbar region: Secondary | ICD-10-CM

## 2021-05-24 DIAGNOSIS — Z5181 Encounter for therapeutic drug level monitoring: Secondary | ICD-10-CM | POA: Diagnosis not present

## 2021-05-24 DIAGNOSIS — M7062 Trochanteric bursitis, left hip: Secondary | ICD-10-CM

## 2021-05-24 DIAGNOSIS — M7061 Trochanteric bursitis, right hip: Secondary | ICD-10-CM | POA: Diagnosis not present

## 2021-05-24 DIAGNOSIS — G894 Chronic pain syndrome: Secondary | ICD-10-CM

## 2021-05-24 DIAGNOSIS — G834 Cauda equina syndrome: Secondary | ICD-10-CM | POA: Diagnosis not present

## 2021-05-24 DIAGNOSIS — Z79891 Long term (current) use of opiate analgesic: Secondary | ICD-10-CM

## 2021-05-24 MED ORDER — ALPRAZOLAM 0.25 MG PO TABS: ORAL_TABLET | ORAL | 2 refills | Status: DC

## 2021-05-24 MED ORDER — OXYCODONE-ACETAMINOPHEN 10-325 MG PO TABS: ORAL_TABLET | ORAL | 0 refills | Status: DC

## 2021-05-24 MED ORDER — MORPHINE SULFATE ER 30 MG PO TBCR: EXTENDED_RELEASE_TABLET | ORAL | 0 refills | Status: DC

## 2021-05-24 MED ORDER — NORTRIPTYLINE HCL 50 MG PO CAPS: 50.0000 mg | ORAL_CAPSULE | Freq: Every day | ORAL | 3 refills | Status: DC

## 2021-05-24 NOTE — Progress Notes (Signed)
Subjective:    Patient ID: Sherry Wong, female    DOB: 05-28-1956, 65 y.o.   MRN: [id number]  HPI: Sherry Wong is a 65 y.o. female who returns for follow up appointment for chronic pain and medication refill. She states her pain is located in her lower back radiating into her bilateral hips, bilateral lower extremities and bilateral feet with tingling and burning.  She rates her pain 4. Her current exercise regime is walking, going to the [facility] three days a week and performing stretching exercises.   Ms. [place]es[place] Morphine equivalent is 120.00 MME.   Last Oral Swab was Performed on 03/22/2021, it was consistent.    Pain Inventory Average Pain 7  Pain Right Now 4 My pain is constant, sharp, burning, dull, stabbing, tingling, and aching  In the last 24 hours, has pain interfered with the following? General activity 9 Relation with others 9 Enjoyment of life 10 What TIME of day is your pain at its worst? morning , daytime, evening, and night Sleep (in general) Fair  Pain is worse with: walking, bending, standing, and some activites Pain improves with: rest, heat/ice, pacing activities, and medication Relief from Meds: 5  Family History  Problem Relation Age of Onset   Anesthesia problems Mother    COPD Mother    Diabetes Mother    Anesthesia problems Sister    Hypotension Neg Hx    Malignant hyperthermia Neg Hx    Pseudochol deficiency Neg Hx    Colon cancer Neg Hx    Esophageal cancer Neg Hx    Liver disease Neg Hx    Pancreatic cancer Neg Hx    Social History   Socioeconomic History   Marital status: Married    Spouse name: Not on file   Number of children: 0   Years of education: 12   Highest education level: Not on file  Occupational History   Occupation: Disabled  Tobacco Use   Smoking status: Never   Smokeless tobacco: Never  Vaping Use   Vaping Use: Never used  Substance and Sexual Activity   Alcohol use: Yes    Alcohol/week: 0.0 standard drinks     Comment: occ- 0.5 can beer every 2 months   Drug use: No   Sexual activity: Not Currently  Other Topics Concern   Not on file  Social History Narrative   Lives at home with husband.   Caffeine use: 1 cup coffee 3 times per week.    Social Determinants of Health   Financial Resource Strain: Not on file  Food Insecurity: Not on file  Transportation Needs: Not on file  Physical Activity: Not on file  Stress: Not on file  Social Connections: Not on file   Past Surgical History:  Procedure Laterality Date   BREAST BIOPSY     right   BREAST LUMPECTOMY Left 2014   CHOLECYSTECTOMY     "gangrene in small intestines clipped by surgeon" per patient   COLONOSCOPY  2009   Dr Rowe Pavy    cysts removed from ovary     [place] Left    ESOPHAGOGASTRODUODENOSCOPY     HEMATOMA EVACUATION N/A 08/25/2013   Procedure: EVACUATION OF LUMBAR HEMATOMA;  Surgeon: Floyce Stakes, MD;  Location: [facility];  Service: Neurosurgery;  Laterality: N/A;   LUMBAR LAMINECTOMY/DECOMPRESSION MICRODISCECTOMY  12/03/2011   Procedure: LUMBAR LAMINECTOMY/DECOMPRESSION MICRODISCECTOMY 2 LEVELS;  Surgeon: Floyce Stakes, MD;  Location:  [place] [facility];  Service: Neurosurgery;  Laterality: Right;  Right Lumbar four-five Lumbar five sacral one Foraminotomies   LUMBAR WOUND DEBRIDEMENT N/A 09/01/2013   Procedure: LUMBAR WOUND DEBRIDEMENT;  Surgeon: Floyce Stakes, MD;  Location: [facility];  Service: Neurosurgery;  Laterality: N/A;   PLACEMENT OF LUMBAR DRAIN N/A 09/01/2013   Procedure: PLACEMENT OF LUMBAR DRAIN;  Surgeon: Floyce Stakes, MD;  Location: [facility];  Service: Neurosurgery;  Laterality: N/A;   TRANSFORAMINAL LUMBAR INTERBODY FUSION (TLIF) WITH PEDICLE SCREW FIXATION 2 LEVEL N/A 08/25/2013   Procedure:  Lumbar four-five, Lumbar five-Sacral one Transforaminal Lumbar Interbody Fusion ;  Surgeon: Floyce Stakes, MD;  Location: [place] [facility];  Service: Neurosurgery;   Laterality: N/A;   Past Surgical History:  Procedure Laterality Date   BREAST BIOPSY     right   BREAST LUMPECTOMY Left 2014   CHOLECYSTECTOMY     "gangrene in small intestines clipped by surgeon" per patient   COLONOSCOPY  2009   Dr Rowe Pavy    cysts removed from ovary     DILATION AND CURETTAGE OF UTERUS     ELBOW SURGERY Left    ESOPHAGOGASTRODUODENOSCOPY     HEMATOMA EVACUATION N/A 08/25/2013   Procedure: EVACUATION OF LUMBAR HEMATOMA;  Surgeon: Floyce Stakes, MD;  [facility]: [facility];  Service: Neurosurgery;  Laterality: N/A;   LUMBAR LAMINECTOMY/DECOMPRESSION MICRODISCECTOMY  12/03/2011   Procedure: LUMBAR LAMINECTOMY/DECOMPRESSION MICRODISCECTOMY 2 LEVELS;  Surgeon: Floyce Stakes, MD;  Location: [place] [facility];  Service: Neurosurgery;  Laterality: Right;  Right Lumbar four-five Lumbar five sacral one Foraminotomies   LUMBAR WOUND DEBRIDEMENT N/A 09/01/2013   Procedure: LUMBAR WOUND DEBRIDEMENT;  Surgeon: Floyce Stakes, MD;  Location: [facility];  Service: Neurosurgery;  Laterality: N/A;   PLACEMENT OF LUMBAR DRAIN N/A 09/01/2013   Procedure: PLACEMENT OF LUMBAR DRAIN;  Surgeon: Floyce Stakes, MD;  Location: [facility];  Service: Neurosurgery;  Laterality: N/A;   TRANSFORAMINAL LUMBAR INTERBODY FUSION (TLIF) WITH PEDICLE SCREW FIXATION 2 LEVEL N/A 08/25/2013   Procedure:  Lumbar four-five, Lumbar five-Sacral one Transforaminal Lumbar Interbody Fusion ;  Surgeon: Floyce Stakes, MD;  Location: [place] [facility];  Service: Neurosurgery;  Laterality: N/A;   Past Medical History:  Diagnosis Date   Anxiety    takes celexa daily   Arthritis    Asthma    Bronchitis    Chronic back pain    ddd/lumbago,and radiculopathy   COVID-17 Sep 2020   Depression    Gastric ulcer    GERD (gastroesophageal reflux disease)    takes Protonix daily   Gout    hx of   H/O hiatal hernia    Hx of colonic polyps    Hypertension    takes Losartan daily   Hypothyroidism    takes  Levothyroxine daily   IBS (irritable bowel syndrome)    Nocturia    Pneumonia    hx of 15+yrs ago   PONV (postoperative nausea and vomiting)    Ulcerative (chronic) enterocolitis ([place]) 07/2020   Dr. Juliann Mule   Ulcerative colitis    Urinary incontinence    BP 131/83   Pulse 90   Temp 98.3 F (36.8 C)   Ht 5' 3.75" (1.619 m)   Wt 221 lb (100.2 kg)   SpO2 98%   BMI 38.23 kg/m   Opioid Risk Score:   Fall Risk Score:  `1  Depression screen PHQ 2/9  Depression screen  [facility] 2/9 03/22/2021 01/19/2021 11/21/2020 05/23/2020 03/21/2020 03/24/2019 01/26/2019  Decreased Interest 1 0 0 1 0 1 1  Down, Depressed, Hopeless 1 0 0 1 0 1 1  PHQ - 2 Score 2 0 0 2 0 2 2  Altered sleeping - - - - 0 - -  Tired, decreased energy - - - - 0 - -  Change in appetite - - - - 0 - -  Feeling bad or failure about yourself  - - - - 0 - -  Trouble concentrating - - - - 0 - -  Moving slowly or fidgety/restless - - - - 0 - -  Suicidal thoughts - - - - 0 - -  PHQ-9 Score - - - - 0 - -    Review of Systems  Musculoskeletal:  Positive for back pain and gait problem.       Pain in buttock, feet & both legs  All other systems reviewed and are negative.     Objective:   Physical Exam Vitals and nursing note reviewed.  Constitutional:      Appearance: Normal appearance.  Cardiovascular:     Rate and Rhythm: Normal rate and regular rhythm.     Pulses: Normal pulses.     Heart sounds: Normal heart sounds.  Pulmonary:     Effort: Pulmonary effort is normal.     Breath sounds: Normal breath sounds.  Musculoskeletal:     Cervical back: Normal range of motion and neck supple.     Comments: Normal Muscle Bulk and Muscle Testing Reveals:  Upper Extremities: Full ROM and Muscle Strength  5/5  Lumbar Paraspinal Tenderness: L-3-L-5 Bilateral Greater Trochanter Tenderness Lower Extremities: Full ROM and Muscle Strength 5/5 Bilateral Lower Extremities Flexion Produces Pain into his Bilateral [place] Arises from  Table Slowly using cane for support Antalgic Gait     Skin:    General: Skin is warm and dry.  Neurological:     Mental Status: She is alert and oriented to person, place, and time.  Psychiatric:        Mood and Affect: Mood normal.        Behavior: Behavior normal.         Assessment & Plan:  1. Cauda equina syndrome: Incontinence: Wearing Depends:Continue to Monitor. 05/24/2021. 2. Insomnia: Continue with Nortriptyline. 05/24/2021 3. 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 Continue current medication regimen. 03/22/2021 Refilled:  Percocet 10/325 mg #120--use 1 tablet every 6 hours as needed and  MS Contin 30 mg one tablet every 12 hours as needed #60. Second scripts sent for the following month We will continue the opioid monitoring program, this consists of regular clinic visits, examinations, urine drug screen, pill counts as well as use of [place] Controlled Substance Reporting system. A 12 month History has been reviewed on the [place] Controlled Substance Reporting System on 05/24/2021. 4. Depression: Continue with Cymbalta. Continue to Monitor. 05/24/2021 5. Anxiety: Continue with current medication regimen with Xanax. Continue to Monitor. 05/24/2021 6.Neuropathy:Continue:with current medication nregimen with  Pamelor. 05/24/2021 7. Constipation: Continue Senokot. Continue to Monitor. 05/24/2021 8.Muscle Spasm: Continue Baclofen.Continue to Monitor. 05/24/2021 9. Lumbar Radiculitis: Continue Pamelor. Continue to Monitor. 05/24/2021 10. Bilateral Feet Pain with tingling; . Continue current medication regimen. Continue to monitor. 08/25/20221 11. Bilateral Greater Trochanter Bursitis: Continue to Alternate Ice and Heat Therapy . Continue HEP as Tolerated. Continue to Monitor.   F/U in 2 months

## 2021-06-19 DIAGNOSIS — K219 Gastro-esophageal reflux disease without esophagitis: Secondary | ICD-10-CM | POA: Diagnosis not present

## 2021-06-19 DIAGNOSIS — R5383 Other fatigue: Secondary | ICD-10-CM | POA: Diagnosis not present

## 2021-06-19 DIAGNOSIS — Z1159 Encounter for screening for other viral diseases: Secondary | ICD-10-CM | POA: Diagnosis not present

## 2021-06-19 DIAGNOSIS — E039 Hypothyroidism, unspecified: Secondary | ICD-10-CM | POA: Diagnosis not present

## 2021-06-19 DIAGNOSIS — I10 Essential (primary) hypertension: Secondary | ICD-10-CM | POA: Diagnosis not present

## 2021-06-19 DIAGNOSIS — E7849 Other hyperlipidemia: Secondary | ICD-10-CM | POA: Diagnosis not present

## 2021-06-19 DIAGNOSIS — E782 Mixed hyperlipidemia: Secondary | ICD-10-CM | POA: Diagnosis not present

## 2021-06-21 DIAGNOSIS — J019 Acute sinusitis, unspecified: Secondary | ICD-10-CM | POA: Diagnosis not present

## 2021-06-21 DIAGNOSIS — E039 Hypothyroidism, unspecified: Secondary | ICD-10-CM | POA: Diagnosis not present

## 2021-06-21 DIAGNOSIS — K219 Gastro-esophageal reflux disease without esophagitis: Secondary | ICD-10-CM | POA: Diagnosis not present

## 2021-06-21 DIAGNOSIS — E7849 Other hyperlipidemia: Secondary | ICD-10-CM | POA: Diagnosis not present

## 2021-06-21 DIAGNOSIS — Z23 Encounter for immunization: Secondary | ICD-10-CM | POA: Diagnosis not present

## 2021-06-21 DIAGNOSIS — R609 Edema, unspecified: Secondary | ICD-10-CM | POA: Diagnosis not present

## 2021-06-21 DIAGNOSIS — K580 Irritable bowel syndrome with diarrhea: Secondary | ICD-10-CM | POA: Diagnosis not present

## 2021-06-22 ENCOUNTER — Ambulatory Visit: Payer: Medicare Other | Admitting: Gastroenterology

## 2021-07-19 ENCOUNTER — Encounter: Payer: Medicare Other | Attending: Physical Medicine and Rehabilitation | Admitting: Registered Nurse

## 2021-07-19 ENCOUNTER — Encounter: Payer: Self-pay | Admitting: Registered Nurse

## 2021-07-19 ENCOUNTER — Other Ambulatory Visit: Payer: Self-pay

## 2021-07-19 VITALS — BP 126/84 | HR 92 | Ht 63.75 in | Wt 231.0 lb

## 2021-07-19 DIAGNOSIS — M5416 Radiculopathy, lumbar region: Secondary | ICD-10-CM | POA: Insufficient documentation

## 2021-07-19 DIAGNOSIS — G894 Chronic pain syndrome: Secondary | ICD-10-CM | POA: Diagnosis not present

## 2021-07-19 DIAGNOSIS — M7062 Trochanteric bursitis, left hip: Secondary | ICD-10-CM | POA: Diagnosis not present

## 2021-07-19 DIAGNOSIS — Z79891 Long term (current) use of opiate analgesic: Secondary | ICD-10-CM | POA: Insufficient documentation

## 2021-07-19 DIAGNOSIS — F411 Generalized anxiety disorder: Secondary | ICD-10-CM | POA: Diagnosis present

## 2021-07-19 DIAGNOSIS — M961 Postlaminectomy syndrome, not elsewhere classified: Secondary | ICD-10-CM | POA: Insufficient documentation

## 2021-07-19 DIAGNOSIS — Z5181 Encounter for therapeutic drug level monitoring: Secondary | ICD-10-CM | POA: Insufficient documentation

## 2021-07-19 DIAGNOSIS — G834 Cauda equina syndrome: Secondary | ICD-10-CM | POA: Insufficient documentation

## 2021-07-19 DIAGNOSIS — F3289 Other specified depressive episodes: Secondary | ICD-10-CM | POA: Insufficient documentation

## 2021-07-19 DIAGNOSIS — M5136 Other intervertebral disc degeneration, lumbar region: Secondary | ICD-10-CM | POA: Insufficient documentation

## 2021-07-19 DIAGNOSIS — M7061 Trochanteric bursitis, right hip: Secondary | ICD-10-CM | POA: Insufficient documentation

## 2021-07-19 MED ORDER — ALPRAZOLAM 0.25 MG PO TABS: ORAL_TABLET | ORAL | 2 refills | Status: DC

## 2021-07-19 MED ORDER — MORPHINE SULFATE ER 30 MG PO TBCR: EXTENDED_RELEASE_TABLET | ORAL | 0 refills | Status: DC

## 2021-07-19 MED ORDER — OXYCODONE-ACETAMINOPHEN 10-325 MG PO TABS: ORAL_TABLET | ORAL | 0 refills | Status: DC

## 2021-07-19 MED ORDER — NORTRIPTYLINE HCL 50 MG PO CAPS: 50.0000 mg | ORAL_CAPSULE | Freq: Every day | ORAL | 3 refills | Status: DC

## 2021-07-19 NOTE — Progress Notes (Signed)
Subjective:    Patient ID: Sherry Wong, female    DOB: 1956/06/02, 65 y.o.   MRN: [id number]  HPI: Sherry Wong is a 65 y.o. female who returns for follow up appointment for chronic pain and medication refill. She states her pain is located in her lower back radiating into her bilateral lower extremities and bilateral feet. She rates her pain 6. Her current exercise regime is walking and performing stretching exercises.  Sherry Wong Morphine equivalent is 120.00 MME.She  is also prescribed Alprazolam. We have discussed the black box warning of using opioids and benzodiazepines. I highlighted the dangers of using these drugs together and discussed the adverse events including respiratory suppression, overdose, cognitive impairment and importance of compliance with current regimen. We will continue to monitor and adjust as indicated.   Oral Swab was Performed today.      Pain Inventory Average Pain 6 Pain Right Now 6 My pain is constant, sharp, burning, dull, stabbing, tingling, and aching  In the last 24 hours, has pain interfered with the following? General activity 9 Relation with others 9 Enjoyment of life 10 What TIME of day is your pain at its worst? morning , daytime, evening, and night Sleep (in general) Fair  Pain is worse with: walking, bending, sitting, standing, and some activites Pain improves with: rest, heat/ice, and medication Relief from Meds: 5  Family History  Problem Relation Age of Onset   Anesthesia problems Mother    COPD Mother    Diabetes Mother    Anesthesia problems Sister    Hypotension Neg Hx    Malignant hyperthermia Neg Hx    Pseudochol deficiency Neg Hx    Colon cancer Neg Hx    Esophageal cancer Neg Hx    Liver disease Neg Hx    Pancreatic cancer Neg Hx    Social History   Socioeconomic History   Marital status: Married    Spouse name: Not on file   Number of children: 0   Years of education: 12   Highest education level: Not on  file  Occupational History   Occupation: Disabled  Tobacco Use   Smoking status: Never   Smokeless tobacco: Never  Vaping Use   Vaping Use: Never used  Substance and Sexual Activity   Alcohol use: Yes    Alcohol/week: 0.0 standard drinks    Comment: occ- 0.5 can beer every 2 months   Drug use: No   Sexual activity: Not Currently  Other Topics Concern   Not on file  Social History Narrative   Lives at home with husband.   Caffeine use: 1 cup coffee 3 times per week.    Social Determinants of Health   Financial Resource Strain: Not on file  Food Insecurity: Not on file  Transportation Needs: Not on file  Physical Activity: Not on file  Stress: Not on file  Social Connections: Not on file   Past Surgical History:  Procedure Laterality Date   BREAST BIOPSY     right   BREAST LUMPECTOMY Wong 2014   CHOLECYSTECTOMY     "gangrene in small intestines clipped by surgeon" per patient   COLONOSCOPY  2009   Dr Rowe Pavy    cysts removed from ovary     Sherry Wong    ESOPHAGOGASTRODUODENOSCOPY     HEMATOMA EVACUATION N/A 08/25/2013   Procedure: EVACUATION OF LUMBAR HEMATOMA;  Surgeon: Floyce Stakes, MD;  Location:  [place] [facility];  Service: Neurosurgery;  Laterality: N/A;   LUMBAR LAMINECTOMY/DECOMPRESSION MICRODISCECTOMY  12/03/2011   Procedure: LUMBAR LAMINECTOMY/DECOMPRESSION MICRODISCECTOMY 2 LEVELS;  Surgeon: Floyce Stakes, MD;  Location: [place] [facility];  Service: Neurosurgery;  Laterality: Right;  Right Lumbar four-five Lumbar five sacral one Foraminotomies   LUMBAR WOUND DEBRIDEMENT N/A 09/01/2013   Procedure: LUMBAR WOUND DEBRIDEMENT;  Surgeon: Floyce Stakes, MD;  Location: [facility];  Service: Neurosurgery;  Laterality: N/A;   PLACEMENT OF LUMBAR DRAIN N/A 09/01/2013   Procedure: PLACEMENT OF LUMBAR DRAIN;  Surgeon: Floyce Stakes, MD;  Location: [facility];  Service: Neurosurgery;  Laterality: N/A;   TRANSFORAMINAL LUMBAR  INTERBODY FUSION (TLIF) WITH PEDICLE SCREW FIXATION 2 LEVEL N/A 08/25/2013   Procedure:  Lumbar four-five, Lumbar five-Sacral one Transforaminal Lumbar Interbody Fusion ;  Surgeon: Floyce Stakes, MD;  Location: [place] [facility];  Service: Neurosurgery;  Laterality: N/A;   Past Surgical History:  Procedure Laterality Date   BREAST BIOPSY     right   BREAST LUMPECTOMY Wong 2014   CHOLECYSTECTOMY     "gangrene in small intestines clipped by surgeon" per patient   COLONOSCOPY  2009   Dr Rowe Pavy    cysts removed from ovary     DILATION AND CURETTAGE OF UTERUS     ELBOW SURGERY Wong    ESOPHAGOGASTRODUODENOSCOPY     HEMATOMA EVACUATION N/A 08/25/2013   Procedure: EVACUATION OF LUMBAR HEMATOMA;  Surgeon: Floyce Stakes, MD;  Location: [facility];  Service: Neurosurgery;  Laterality: N/A;   LUMBAR LAMINECTOMY/DECOMPRESSION MICRODISCECTOMY  12/03/2011   Procedure: LUMBAR LAMINECTOMY/DECOMPRESSION MICRODISCECTOMY 2 LEVELS;  Surgeon: Floyce Stakes, MD;  Location: [place] [facility];  Service: Neurosurgery;  Laterality: Right;  Right Lumbar four-five Lumbar five sacral one Foraminotomies   LUMBAR WOUND DEBRIDEMENT N/A 09/01/2013   Procedure: LUMBAR WOUND DEBRIDEMENT;  Surgeon: Floyce Stakes, MD;  Location: [facility];  Service: Neurosurgery;  Laterality: N/A;   PLACEMENT OF LUMBAR DRAIN N/A 09/01/2013   Procedure: PLACEMENT OF LUMBAR DRAIN;  Surgeon: Floyce Stakes, MD;  Location: [facility];  Service: Neurosurgery;  Laterality: N/A;   TRANSFORAMINAL LUMBAR INTERBODY FUSION (TLIF) WITH PEDICLE SCREW FIXATION 2 LEVEL N/A 08/25/2013   Procedure:  Lumbar four-five, Lumbar five-Sacral one Transforaminal Lumbar Interbody Fusion ;  Surgeon: Floyce Stakes, MD;  Location: [place] [facility];  Service: Neurosurgery;  Laterality: N/A;   Past Medical History:  Diagnosis Date   Anxiety    takes celexa daily   Arthritis    Asthma    Bronchitis    Chronic back pain    ddd/lumbago,and radiculopathy    COVID-17 Sep 2020   Depression    Gastric ulcer    GERD (gastroesophageal reflux disease)    takes Protonix daily   Gout    hx of   H/O hiatal hernia    Hx of colonic polyps    Hypertension    takes Losartan daily   Hypothyroidism    takes Levothyroxine daily   IBS (irritable bowel syndrome)    Nocturia    Pneumonia    hx of 15+yrs ago   PONV (postoperative nausea and vomiting)    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 Juliann Mule   Ulcerative colitis    Urinary incontinence    Wt 231 lb (104.8 kg)   BMI 39.96 kg/m   Opioid Risk Score:   Fall Risk Score:  `1  Depression  screen PHQ 2/9  Depression screen [facility] 2/9 07/19/2021 05/24/2021 03/22/2021 01/19/2021 11/21/2020 05/23/2020 03/21/2020  Decreased Interest 0 0 1 0 0 1 0  Down, Depressed, Hopeless 0 0 1 0 0 1 0  PHQ - 2 Score 0 0 2 0 0 2 0  Altered sleeping - - - - - - 0  Tired, decreased energy - - - - - - 0  Change in appetite - - - - - - 0  Feeling bad or failure about yourself  - - - - - - 0  Trouble concentrating - - - - - - 0  Moving slowly or fidgety/restless - - - - - - 0  Suicidal thoughts - - - - - - 0  PHQ-9 Score - - - - - - 0     Review of Systems  Constitutional: Negative.   HENT: Negative.    Eyes: Negative.   Respiratory: Negative.    Cardiovascular: Negative.   Gastrointestinal: Negative.   Endocrine: Negative.   Genitourinary: Negative.   Musculoskeletal:  Positive for back pain.  Skin: Negative.   Allergic/Immunologic: Negative.   Neurological: Negative.   Hematological: Negative.   Psychiatric/Behavioral: Negative.        Objective:   Physical Exam Vitals and nursing note reviewed.  Constitutional:      Appearance: Normal appearance.  Cardiovascular:     Rate and Rhythm: Normal rate and regular rhythm.     Pulses: Normal pulses.     Heart sounds: Normal heart sounds.  Pulmonary:     Effort: Pulmonary effort is normal.     Breath sounds: Normal breath sounds.   Musculoskeletal:     Cervical back: Normal range of motion and neck supple.     Comments: Normal Muscle Bulk and Muscle Testing Reveals:  Upper Extremities: Full ROM and Muscle Strength 5/5  Lumbar Hypersensitivity Bilateral Greater Trochanter Tenderness Lower Extremities: Full ROM and Muscle Strength 5/5 Arises from Table slowly using cane for support Antalgic  Gait     Skin:    General: Skin is warm and dry.  Neurological:     Mental Status: She is alert and oriented to person, place, and time.  Psychiatric:        Mood and Affect: Mood normal.        Behavior: Behavior normal.         Assessment & Plan:  1. Cauda equina syndrome: Incontinence: Wearing Depends:Continue to Monitor. 07/19/2021. 2. Insomnia: Continue with Nortriptyline. 07/19/2021 3. 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 Continue current medication regimen. 07/19/2021 Refilled:  Percocet 10/325 mg #120--use 1 tablet every 6 hours as needed and  MS Contin 30 mg one tablet every 12 hours as needed #60. Second scripts sent for the following month We will continue the opioid monitoring program, this consists of regular clinic visits, examinations, urine drug screen, pill counts as well as use of [place] Controlled Substance Reporting system. A 12 month History has been reviewed on the [place] Controlled Substance Reporting System on 07/19/2021. 4. Depression: Continue with Cymbalta. Continue to Monitor. 07/19/2021 5. Anxiety: Continue with current medication regimen with Xanax. Continue to Monitor. 07/19/2021 6.Neuropathy:Continue:with current medication nregimen with  Pamelor. 07/19/2021 7. Constipation: Continue Senokot. Continue to Monitor. 07/19/2021 8.Muscle Spasm: Continue Baclofen.Continue to Monitor. 07/19/2021 9. Lumbar Radiculitis: Continue Pamelor. Continue to Monitor. 07/19/2021 10. Bilateral Feet Neuropathic Pain with tingling; . Continue  current medication regimen. Continue to monitor.  10/20/20221 11. Bilateral Greater Trochanter Bursitis: Continue to Alternate Ice and Heat Therapy . Continue HEP as Tolerated. Continue to Monitor. 07/19/2021   F/U in 2 months

## 2021-07-24 ENCOUNTER — Other Ambulatory Visit: Payer: Self-pay | Admitting: Registered Nurse

## 2021-07-26 ENCOUNTER — Telehealth: Payer: Self-pay | Admitting: *Deleted

## 2021-07-26 LAB — DRUG TOX MONITOR 1 W/CONF, ORAL FLD
Amphetamines: NEGATIVE ng/mL (ref <10)
Barbiturates: NEGATIVE ng/mL (ref <10)
Benzodiazepines: NEGATIVE ng/mL (ref <0.50)
Buprenorphine: NEGATIVE ng/mL (ref <0.10)
Cocaine: NEGATIVE ng/mL (ref <5.0)
Codeine: NEGATIVE ng/mL (ref <2.5)
Dihydrocodeine: NEGATIVE ng/mL (ref <2.5)
Fentanyl: NEGATIVE ng/mL (ref <0.10)
Heroin Metabolite: NEGATIVE ng/mL (ref <1.0)
Hydrocodone: NEGATIVE ng/mL (ref <2.5)
Hydromorphone: NEGATIVE ng/mL (ref <2.5)
MARIJUANA: NEGATIVE ng/mL (ref <2.5)
MDMA: NEGATIVE ng/mL (ref <10)
Meprobamate: NEGATIVE ng/mL (ref <2.5)
Methadone: NEGATIVE ng/mL (ref <5.0)
Morphine: 6.3 ng/mL — ABNORMAL HIGH (ref <2.5)
Nicotine Metabolite: NEGATIVE ng/mL (ref <5.0)
Norhydrocodone: NEGATIVE ng/mL (ref <2.5)
Noroxycodone: 24.6 ng/mL — ABNORMAL HIGH (ref <2.5)
Opiates: POSITIVE ng/mL — AB (ref <2.5)
Oxycodone: 223.4 ng/mL — ABNORMAL HIGH (ref <2.5)
Oxymorphone: NEGATIVE ng/mL (ref <2.5)
Phencyclidine: NEGATIVE ng/mL (ref <10)
Tapentadol: NEGATIVE ng/mL (ref <5.0)
Tramadol: NEGATIVE ng/mL (ref <5.0)
Zolpidem: NEGATIVE ng/mL (ref <5.0)

## 2021-07-26 LAB — DRUG TOX ALC METAB W/CON, ORAL FLD: Alcohol Metabolite: NEGATIVE ng/mL (ref <25)

## 2021-07-26 NOTE — Telephone Encounter (Signed)
Oral swab drug screen was consistent for prescribed medications.  ?

## 2021-08-21 ENCOUNTER — Other Ambulatory Visit: Payer: Self-pay | Admitting: Registered Nurse

## 2021-08-21 DIAGNOSIS — M5136 Other intervertebral disc degeneration, lumbar region: Secondary | ICD-10-CM

## 2021-08-21 DIAGNOSIS — G834 Cauda equina syndrome: Secondary | ICD-10-CM

## 2021-08-21 DIAGNOSIS — M51369 Other intervertebral disc degeneration, lumbar region without mention of lumbar back pain or lower extremity pain: Secondary | ICD-10-CM

## 2021-08-21 NOTE — Telephone Encounter (Signed)
Please review and or  prescribe. [name] NP is out of the office.  Patient need refill of Oxycodone & Morphine.Thank you.  Filled  Written  ID  Drug  QTY  Days  Prescriber  RX #  Dispenser  Refill  Daily Dose*  Pymt Type  PMP  07/19/2021 07/19/2021 1  Oxycodone-Acetaminophen 10-325 120.00 30 Eu Tho [id number] Lay ([id number]) 0/0 60.00 MME Comm Ins [place] 07/19/2021 07/19/2021 1  Morphine Sulf Er 30 Mg Tablet 60.00 30 Eu Tho [id number] Lay ([id number]) 0/0 60.00 MME Comm Ins [place] 07/19/2021 07/19/2021 1  Alprazolam 0.25 Mg Tablet 60.00 30 Eu Tho [id number] Lay (5785) 0/2 1.00 LME Comm Ins [place]

## 2021-09-18 ENCOUNTER — Other Ambulatory Visit: Payer: Self-pay | Admitting: Registered Nurse

## 2021-09-18 ENCOUNTER — Encounter: Payer: Self-pay | Admitting: Registered Nurse

## 2021-09-18 ENCOUNTER — Encounter: Payer: Medicare Other | Attending: Physical Medicine and Rehabilitation | Admitting: Registered Nurse

## 2021-09-18 ENCOUNTER — Other Ambulatory Visit: Payer: Self-pay

## 2021-09-18 VITALS — BP 131/82 | HR 96 | Temp 98.6°F | Ht 63.7 in | Wt 238.0 lb

## 2021-09-18 DIAGNOSIS — Z79891 Long term (current) use of opiate analgesic: Secondary | ICD-10-CM | POA: Insufficient documentation

## 2021-09-18 DIAGNOSIS — G834 Cauda equina syndrome: Secondary | ICD-10-CM | POA: Insufficient documentation

## 2021-09-18 DIAGNOSIS — Z5181 Encounter for therapeutic drug level monitoring: Secondary | ICD-10-CM | POA: Diagnosis not present

## 2021-09-18 DIAGNOSIS — M5136 Other intervertebral disc degeneration, lumbar region: Secondary | ICD-10-CM | POA: Insufficient documentation

## 2021-09-18 DIAGNOSIS — M961 Postlaminectomy syndrome, not elsewhere classified: Secondary | ICD-10-CM | POA: Diagnosis not present

## 2021-09-18 DIAGNOSIS — M7062 Trochanteric bursitis, left hip: Secondary | ICD-10-CM | POA: Diagnosis not present

## 2021-09-18 DIAGNOSIS — M7061 Trochanteric bursitis, right hip: Secondary | ICD-10-CM | POA: Diagnosis not present

## 2021-09-18 DIAGNOSIS — M5416 Radiculopathy, lumbar region: Secondary | ICD-10-CM | POA: Diagnosis not present

## 2021-09-18 DIAGNOSIS — G894 Chronic pain syndrome: Secondary | ICD-10-CM | POA: Diagnosis not present

## 2021-09-18 DIAGNOSIS — F411 Generalized anxiety disorder: Secondary | ICD-10-CM | POA: Insufficient documentation

## 2021-09-18 DIAGNOSIS — F3289 Other specified depressive episodes: Secondary | ICD-10-CM | POA: Diagnosis present

## 2021-09-18 MED ORDER — OXYCODONE-ACETAMINOPHEN 10-325 MG PO TABS: 1.0000 | ORAL_TABLET | Freq: Four times a day (QID) | ORAL | 0 refills | Status: DC | PRN

## 2021-09-18 MED ORDER — ALPRAZOLAM 0.25 MG PO TABS: ORAL_TABLET | ORAL | 2 refills | Status: DC

## 2021-09-18 MED ORDER — MORPHINE SULFATE ER 30 MG PO TBCR: EXTENDED_RELEASE_TABLET | ORAL | 0 refills | Status: DC

## 2021-09-18 MED ORDER — OXYCODONE-ACETAMINOPHEN 10-325 MG PO TABS
1.0000 | ORAL_TABLET | Freq: Four times a day (QID) | ORAL | 0 refills | Status: DC | PRN
Start: 2021-09-18 — End: 2021-11-19

## 2021-09-18 MED ORDER — MORPHINE SULFATE ER 30 MG PO TBCR
EXTENDED_RELEASE_TABLET | ORAL | 0 refills | Status: DC
Start: 2021-09-18 — End: 2021-09-18

## 2021-09-18 NOTE — Progress Notes (Signed)
Subjective:    Patient ID: Sherry Wong, female    DOB: [birth year], 65 y.o.   MRN: [id number]  HPI: Sherry Wong is a 65 y.o. female who returns for follow up appointment for chronic pain and medication refill. She states her pain is located in her lower back radiating into her bilateral lower extremities and bilateral feet with tingling and numbness. He rates his pain 4. His current exercise regime is walking and performing stretching exercises.  Sherry Wong states her sister in law was diagnosed with Breast Cancer, emotional support given.   Sherry Wong Morphine equivalent is 120.00  MME.   Last UDS was Performed on 07/19/2021, it was consistent.      Pain Inventory Average Pain 6 Pain Right Now 4 My pain is constant, sharp, burning, dull, stabbing, tingling, and aching  In the last 24 hours, has pain interfered with the following? General activity 9 Relation with others 9 Enjoyment of life 10 What TIME of day is your pain at its worst? morning , daytime, evening, and night Sleep (in general) Fair  Pain is worse with: walking, bending, sitting, standing, and some activites Pain improves with: heat/ice, pacing activities, and medication Relief from Meds: 4  Family History  Problem Relation Age of Onset   Anesthesia problems Mother    COPD Mother    Diabetes Mother    Anesthesia problems Sister    Hypotension Neg Hx    Malignant hyperthermia Neg Hx    Pseudochol deficiency Neg Hx    Colon cancer Neg Hx    Esophageal cancer Neg Hx    Liver disease Neg Hx    Pancreatic cancer Neg Hx    Social History   Socioeconomic History   Marital status: Married    Spouse name: Not on file   Number of children: 0   Years of education: 12   Highest education level: Not on file  Occupational History   Occupation: Disabled  Tobacco Use   Smoking status: Never   Smokeless tobacco: Never  Vaping Use   Vaping Use: Never used  Substance and Sexual Activity   Alcohol use:  Yes    Alcohol/week: 0.0 standard drinks    Comment: occ- 0.5 can beer every 2 months   Drug use: No   Sexual activity: Not Currently  Other Topics Concern   Not on file  Social History Narrative   Lives at home with husband.   Caffeine use: 1 cup coffee 3 times per week.    Social Determinants of Health   Financial Resource Strain: Not on file  Food Insecurity: Not on file  Transportation Needs: Not on file  Physical Activity: Not on file  Stress: Not on file  Social Connections: Not on file   Past Surgical History:  Procedure Laterality Date   BREAST BIOPSY     right   BREAST LUMPECTOMY Left 2014   CHOLECYSTECTOMY     "gangrene in small intestines clipped by surgeon" per patient   COLONOSCOPY  2009   Dr Rowe Pavy    cysts removed from ovary     [place] Left    ESOPHAGOGASTRODUODENOSCOPY     HEMATOMA EVACUATION N/A 08/25/2013   Procedure: EVACUATION OF LUMBAR HEMATOMA;  Surgeon: Floyce Stakes, MD;  Location: [facility];  Service: Neurosurgery;  Laterality: N/A;   LUMBAR LAMINECTOMY/DECOMPRESSION MICRODISCECTOMY  12/03/2011   Procedure: LUMBAR LAMINECTOMY/DECOMPRESSION MICRODISCECTOMY 2 LEVELS;  Surgeon:  Floyce Stakes, MD;  Location: [place] [facility];  Service: Neurosurgery;  Laterality: Right;  Right Lumbar four-five Lumbar five sacral one Foraminotomies   LUMBAR WOUND DEBRIDEMENT N/A 09/01/2013   Procedure: LUMBAR WOUND DEBRIDEMENT;  Surgeon: Floyce Stakes, MD;  Location: [facility];  Service: Neurosurgery;  Laterality: N/A;   PLACEMENT OF LUMBAR DRAIN N/A 09/01/2013   Procedure: PLACEMENT OF LUMBAR DRAIN;  Surgeon: Floyce Stakes, MD;  Location: [facility];  Service: Neurosurgery;  Laterality: N/A;   TRANSFORAMINAL LUMBAR INTERBODY FUSION (TLIF) WITH PEDICLE SCREW FIXATION 2 LEVEL N/A 08/25/2013   Procedure:  Lumbar four-five, Lumbar five-Sacral one Transforaminal Lumbar Interbody Fusion ;  Surgeon: Floyce Stakes, MD;   Location: [place] [facility];  Service: Neurosurgery;  Laterality: N/A;   Past Surgical History:  Procedure Laterality Date   BREAST BIOPSY     right   BREAST LUMPECTOMY Left 2014   CHOLECYSTECTOMY     "gangrene in small intestines clipped by surgeon" per patient   COLONOSCOPY  2009   Dr Rowe Pavy    cysts removed from ovary     DILATION AND CURETTAGE OF UTERUS     ELBOW SURGERY Left    ESOPHAGOGASTRODUODENOSCOPY     HEMATOMA EVACUATION N/A 08/25/2013   Procedure: EVACUATION OF LUMBAR HEMATOMA;  Surgeon: Floyce Stakes, MD;  Location: [facility];  Service: Neurosurgery;  Laterality: N/A;   LUMBAR LAMINECTOMY/DECOMPRESSION MICRODISCECTOMY  12/03/2011   Procedure: LUMBAR LAMINECTOMY/DECOMPRESSION MICRODISCECTOMY 2 LEVELS;  Surgeon: Floyce Stakes, MD;  Location: [place] [facility];  Service: Neurosurgery;  Laterality: Right;  Right Lumbar four-five Lumbar five sacral one Foraminotomies   LUMBAR WOUND DEBRIDEMENT N/A 09/01/2013   Procedure: LUMBAR WOUND DEBRIDEMENT;  Surgeon: Floyce Stakes, MD;  Location: [facility];  Service: Neurosurgery;  Laterality: N/A;   PLACEMENT OF LUMBAR DRAIN N/A 09/01/2013   Procedure: PLACEMENT OF LUMBAR DRAIN;  Surgeon: Floyce Stakes, MD;  Location: [facility];  Service: Neurosurgery;  Laterality: N/A;   TRANSFORAMINAL LUMBAR INTERBODY FUSION (TLIF) WITH PEDICLE SCREW FIXATION 2 LEVEL N/A 08/25/2013   Procedure:  Lumbar four-five, Lumbar five-Sacral one Transforaminal Lumbar Interbody Fusion ;  Surgeon: Floyce Stakes, MD;  Location: [place] [facility];  Service: Neurosurgery;  Laterality: N/A;   Past Medical History:  Diagnosis Date   Anxiety    takes celexa daily   Arthritis    Asthma    Bronchitis    Chronic back pain    ddd/lumbago,and radiculopathy   COVID-17 Sep 2020   Depression    Gastric ulcer    GERD (gastroesophageal reflux disease)    takes Protonix daily   Gout    hx of   H/O hiatal hernia    Hx of colonic polyps    Hypertension     takes Losartan daily   Hypothyroidism    takes Levothyroxine daily   IBS (irritable bowel syndrome)    Nocturia    Pneumonia    hx of 15+yrs ago   PONV (postoperative nausea and vomiting)    Ulcerative (chronic) enterocolitis ([place]) 07/2020   Dr. Juliann Mule   Ulcerative colitis    Urinary incontinence    There were no vitals taken for this visit.  Opioid Risk Score:   Fall Risk Score:  `1  Depression screen PHQ 2/9  Depression screen [facility] 2/9 07/19/2021 05/24/2021 03/22/2021 01/19/2021 11/21/2020 05/23/2020 03/21/2020  Decreased Interest 0 0 1 0 0 1 0  Down, Depressed, Hopeless  0 0 1 0 0 1 0  PHQ - 2 Score 0 0 2 0 0 2 0  Altered sleeping - - - - - - 0  Tired, decreased energy - - - - - - 0  Change in appetite - - - - - - 0  Feeling bad or failure about yourself  - - - - - - 0  Trouble concentrating - - - - - - 0  Moving slowly or fidgety/restless - - - - - - 0  Suicidal thoughts - - - - - - 0  PHQ-9 Score - - - - - - 0    Review of Systems  Musculoskeletal:  Positive for back pain and gait problem.       Pain in both legs  All other systems reviewed and are negative.     Objective:   Physical Exam Vitals and nursing note reviewed.  Constitutional:      Appearance: Normal appearance.  Cardiovascular:     Rate and Rhythm: Normal rate and regular rhythm.     Pulses: Normal pulses.     Heart sounds: Normal heart sounds.  Pulmonary:     Effort: Pulmonary effort is normal.     Breath sounds: Normal breath sounds.  Musculoskeletal:     Cervical back: Normal range of motion and neck supple.     Comments: Normal Muscle Bulk and Muscle Testing Reveals:  Upper Extremities: Full ROM and Muscle Strength 5/5 Lumbar Hypersensitivity Lower Extremities: Full ROM and Muscle Strength 5/5 Arises from chair slowly using cane for support Antalgic Gait     Skin:    General: Skin is warm and dry.  Neurological:     Mental Status: She is alert and oriented to person, place, and time.   Psychiatric:        Mood and Affect: Mood normal.        Behavior: Behavior normal.         Assessment & Plan:  1. Cauda equina syndrome: Incontinence: Wearing Depends:Continue to Monitor. 09/18/2021. 2. Insomnia: Continue with Nortriptyline. 09/18/2021 3. 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 Continue current medication regimen. 09/18/2021 Refilled:  Percocet 10/325 mg #120--use 1 tablet every 6 hours as needed and  MS Contin 30 mg one tablet every 12 hours as needed #60. Second scripts sent for the following month We will continue the opioid monitoring program, this consists of regular clinic visits, examinations, urine drug screen, pill counts as well as use of [place] Controlled Substance Reporting system. A 12 month History has been reviewed on the [place] Controlled Substance Reporting System on 09/18/2021. 4. Depression: Continue with Cymbalta. Continue to Monitor. 09/18/2021 5. Anxiety: Continue with current medication regimen with Xanax. Continue to Monitor. 09/18/2021 6.Neuropathy:Continue:with current medication nregimen with  Pamelor. 09/18/2021 7. Constipation: Continue Senokot. Continue to Monitor. 09/18/2021 8.Muscle Spasm: Continue Baclofen.Continue to Monitor. 09/18/2021 9. Lumbar Radiculitis: Continue Pamelor. Continue to Monitor. 09/18/2021 10. Bilateral Feet Neuropathic Pain with tingling; . Continue current medication regimen. Continue to monitor. 12/20/20221 11. Bilateral Greater Trochanter Bursitis: Continue to Alternate Ice and Heat Therapy . Continue HEP as Tolerated. Continue to Monitor. 09/18/2021   F/U in 2 months

## 2021-09-26 ENCOUNTER — Encounter: Payer: Self-pay | Admitting: Registered Nurse

## 2021-10-15 DIAGNOSIS — R5383 Other fatigue: Secondary | ICD-10-CM | POA: Diagnosis not present

## 2021-10-15 DIAGNOSIS — E782 Mixed hyperlipidemia: Secondary | ICD-10-CM | POA: Diagnosis not present

## 2021-10-15 DIAGNOSIS — E876 Hypokalemia: Secondary | ICD-10-CM | POA: Diagnosis not present

## 2021-10-15 DIAGNOSIS — K219 Gastro-esophageal reflux disease without esophagitis: Secondary | ICD-10-CM | POA: Diagnosis not present

## 2021-10-15 DIAGNOSIS — Z1159 Encounter for screening for other viral diseases: Secondary | ICD-10-CM | POA: Diagnosis not present

## 2021-10-15 DIAGNOSIS — I10 Essential (primary) hypertension: Secondary | ICD-10-CM | POA: Diagnosis not present

## 2021-10-22 DIAGNOSIS — R110 Nausea: Secondary | ICD-10-CM | POA: Diagnosis not present

## 2021-10-22 DIAGNOSIS — R197 Diarrhea, unspecified: Secondary | ICD-10-CM | POA: Diagnosis not present

## 2021-11-19 ENCOUNTER — Other Ambulatory Visit: Payer: Self-pay

## 2021-11-19 ENCOUNTER — Encounter: Payer: Medicare Other | Attending: Physical Medicine and Rehabilitation | Admitting: Registered Nurse

## 2021-11-19 ENCOUNTER — Encounter: Payer: Self-pay | Admitting: Registered Nurse

## 2021-11-19 VITALS — BP 133/87 | HR 92 | Temp 99.4°F | Ht 63.7 in | Wt 235.8 lb

## 2021-11-19 DIAGNOSIS — G894 Chronic pain syndrome: Secondary | ICD-10-CM | POA: Diagnosis not present

## 2021-11-19 DIAGNOSIS — M5416 Radiculopathy, lumbar region: Secondary | ICD-10-CM | POA: Diagnosis not present

## 2021-11-19 DIAGNOSIS — M961 Postlaminectomy syndrome, not elsewhere classified: Secondary | ICD-10-CM | POA: Insufficient documentation

## 2021-11-19 DIAGNOSIS — Z5181 Encounter for therapeutic drug level monitoring: Secondary | ICD-10-CM | POA: Diagnosis not present

## 2021-11-19 DIAGNOSIS — F411 Generalized anxiety disorder: Secondary | ICD-10-CM | POA: Diagnosis present

## 2021-11-19 DIAGNOSIS — M5136 Other intervertebral disc degeneration, lumbar region: Secondary | ICD-10-CM | POA: Diagnosis not present

## 2021-11-19 DIAGNOSIS — F3289 Other specified depressive episodes: Secondary | ICD-10-CM | POA: Diagnosis present

## 2021-11-19 DIAGNOSIS — G834 Cauda equina syndrome: Secondary | ICD-10-CM | POA: Insufficient documentation

## 2021-11-19 DIAGNOSIS — M7061 Trochanteric bursitis, right hip: Secondary | ICD-10-CM | POA: Diagnosis not present

## 2021-11-19 DIAGNOSIS — M7062 Trochanteric bursitis, left hip: Secondary | ICD-10-CM | POA: Diagnosis not present

## 2021-11-19 DIAGNOSIS — Z79891 Long term (current) use of opiate analgesic: Secondary | ICD-10-CM | POA: Insufficient documentation

## 2021-11-19 MED ORDER — MORPHINE SULFATE ER 30 MG PO TBCR: EXTENDED_RELEASE_TABLET | ORAL | 0 refills | Status: DC

## 2021-11-19 MED ORDER — ALPRAZOLAM 0.25 MG PO TABS: ORAL_TABLET | ORAL | 2 refills | Status: DC

## 2021-11-19 MED ORDER — OXYCODONE-ACETAMINOPHEN 10-325 MG PO TABS: 1.0000 | ORAL_TABLET | Freq: Four times a day (QID) | ORAL | 0 refills | Status: DC | PRN

## 2021-11-19 MED ORDER — NORTRIPTYLINE HCL 50 MG PO CAPS: 50.0000 mg | ORAL_CAPSULE | Freq: Every day | ORAL | 3 refills | Status: DC

## 2021-11-19 NOTE — Progress Notes (Signed)
Subjective:    Patient ID: Sherry Wong, female    DOB: 1956/02/19, 66 y.o.   MRN: [id number]  HPI: Sherry Wong is a 66 y.o. female who returns for follow up appointment for chronic pain and medication refill. She states her pain is located in her lower back radiating into her bilateral hips, bilateral lower extremities and bilateral feet.She also reports increase intensity of Lumbar radicular pain over the last few weeks, she denies falling. She will send a My-Chart message in a week with update, she verbalizes understanding.  She rates her pain 4. Her current exercise regime is  attending [employer] three days a week, walking and performing stretching exercises.  [name] Morphine equivalent is  116.00 MME.   Oral Swab was Performed today.     Pain Inventory Average Pain 7 Pain Right Now 4 My pain is sharp, burning, dull, stabbing, tingling, and aching  In the last 24 hours, has pain interfered with the following? General activity 9 Relation with others 9 Enjoyment of life 10 What TIME of day is your pain at its worst? morning , daytime, evening, and night Sleep (in general) Fair  Pain is worse with: walking, bending, sitting, standing, and some activites Pain improves with: rest, heat/ice, pacing activities, and medication Relief from Meds: 4  Family History  Problem Relation Age of Onset   Anesthesia problems Mother    COPD Mother    Diabetes Mother    Anesthesia problems Sister    Hypotension Neg Hx    Malignant hyperthermia Neg Hx    Pseudochol deficiency Neg Hx    Colon cancer Neg Hx    Esophageal cancer Neg Hx    Liver disease Neg Hx    Pancreatic cancer Neg Hx    Social History   Socioeconomic History   Marital status: Married    Spouse name: Not on file   Number of children: 0   Years of education: 12   Highest education level: Not on file  Occupational History   Occupation: Disabled  Tobacco Use   Smoking status: Never   Smokeless tobacco: Never   Vaping Use   Vaping Use: Never used  Substance and Sexual Activity   Alcohol use: Yes    Alcohol/week: 0.0 standard drinks    Comment: occ- 0.5 can beer every 2 months   Drug use: No   Sexual activity: Not Currently  Other Topics Concern   Not on file  Social History Narrative   Lives at home with husband.   Caffeine use: 1 cup coffee 3 times per week.    Social Determinants of Health   Financial Resource Strain: Not on file  Food Insecurity: Not on file  Transportation Needs: Not on file  Physical Activity: Not on file  Stress: Not on file  Social Connections: Not on file   Past Surgical History:  Procedure Laterality Date   BREAST BIOPSY     right   BREAST LUMPECTOMY Left 2014   CHOLECYSTECTOMY     "gangrene in small intestines clipped by surgeon" per patient   COLONOSCOPY  2009   Dr Rowe Pavy    cysts removed from ovary     [place] Left    ESOPHAGOGASTRODUODENOSCOPY     HEMATOMA EVACUATION N/A 08/25/2013   Procedure: EVACUATION OF LUMBAR HEMATOMA;  Surgeon: Floyce Stakes, MD;  Location: [facility];  Service: Neurosurgery;  Laterality: N/A;   LUMBAR  LAMINECTOMY/DECOMPRESSION MICRODISCECTOMY  12/03/2011   Procedure: LUMBAR LAMINECTOMY/DECOMPRESSION MICRODISCECTOMY 2 LEVELS;  Surgeon: Floyce Stakes, MD;  Location: [place] [facility];  Service: Neurosurgery;  Laterality: Right;  Right Lumbar four-five Lumbar five sacral one Foraminotomies   LUMBAR WOUND DEBRIDEMENT N/A 09/01/2013   Procedure: LUMBAR WOUND DEBRIDEMENT;  Surgeon: Floyce Stakes, MD;  Location: [facility];  Service: Neurosurgery;  Laterality: N/A;   PLACEMENT OF LUMBAR DRAIN N/A 09/01/2013   Procedure: PLACEMENT OF LUMBAR DRAIN;  Surgeon: Floyce Stakes, MD;  Location: [facility];  Service: Neurosurgery;  Laterality: N/A;   TRANSFORAMINAL LUMBAR INTERBODY FUSION (TLIF) WITH PEDICLE SCREW FIXATION 2 LEVEL N/A 08/25/2013   Procedure:  Lumbar four-five, Lumbar  five-Sacral one Transforaminal Lumbar Interbody Fusion ;  Surgeon: Floyce Stakes, MD;  Location: [place] [facility];  Service: Neurosurgery;  Laterality: N/A;   Past Surgical History:  Procedure Laterality Date   BREAST BIOPSY     right   BREAST LUMPECTOMY Left 2014   CHOLECYSTECTOMY     "gangrene in small intestines clipped by surgeon" per patient   COLONOSCOPY  2009   Dr Rowe Pavy    cysts removed from ovary     DILATION AND CURETTAGE OF UTERUS     ELBOW SURGERY Left    ESOPHAGOGASTRODUODENOSCOPY     HEMATOMA EVACUATION N/A 08/25/2013   Procedure: EVACUATION OF LUMBAR HEMATOMA;  Surgeon: Floyce Stakes, MD;  Location: [facility];  Service: Neurosurgery;  Laterality: N/A;   LUMBAR LAMINECTOMY/DECOMPRESSION MICRODISCECTOMY  12/03/2011   Procedure: LUMBAR LAMINECTOMY/DECOMPRESSION MICRODISCECTOMY 2 LEVELS;  Surgeon: Floyce Stakes, MD;  Location: [place] [facility];  Service: Neurosurgery;  Laterality: Right;  Right Lumbar four-five Lumbar five sacral one Foraminotomies   LUMBAR WOUND DEBRIDEMENT N/A 09/01/2013   Procedure: LUMBAR WOUND DEBRIDEMENT;  Surgeon: Floyce Stakes, MD;  Location: [facility];  Service: Neurosurgery;  Laterality: N/A;   PLACEMENT OF LUMBAR DRAIN N/A 09/01/2013   Procedure: PLACEMENT OF LUMBAR DRAIN;  Surgeon: Floyce Stakes, MD;  Location: [facility];  Service: Neurosurgery;  Laterality: N/A;   TRANSFORAMINAL LUMBAR INTERBODY FUSION (TLIF) WITH PEDICLE SCREW FIXATION 2 LEVEL N/A 08/25/2013   Procedure:  Lumbar four-five, Lumbar five-Sacral one Transforaminal Lumbar Interbody Fusion ;  Surgeon: Floyce Stakes, MD;  Location: [place] [facility];  Service: Neurosurgery;  Laterality: N/A;   Past Medical History:  Diagnosis Date   Anxiety    takes celexa daily   Arthritis    Asthma    Bronchitis    Chronic back pain    ddd/lumbago,and radiculopathy   COVID-17 Sep 2020   Depression    Gastric ulcer    GERD (gastroesophageal reflux disease)    takes Protonix daily    Gout    hx of   H/O hiatal hernia    Hx of colonic polyps    Hypertension    takes Losartan daily   Hypothyroidism    takes Levothyroxine daily   IBS (irritable bowel syndrome)    Nocturia    Pneumonia    hx of 15+yrs ago   PONV (postoperative nausea and vomiting)    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 Juliann Mule   Ulcerative colitis    Urinary incontinence    BP 133/87    Pulse 92    Temp 99.4 F (37.4 C)    Ht 5' 3.7" (1.618 m)    Wt 235 lb 12.8 oz (107 kg)  SpO2 95%    BMI 40.86 kg/m   Opioid Risk Score:   Fall Risk Score:  `1  Depression screen PHQ 2/9  Depression screen [facility] 2/9 11/19/2021 09/18/2021 07/19/2021 05/24/2021 03/22/2021 01/19/2021 11/21/2020  Decreased Interest 3 1 0 0 1 0 0  Down, Depressed, Hopeless 3 1 0 0 1 0 0  PHQ - 2 Score 6 2 0 0 2 0 0  Altered sleeping - - - - - - -  Tired, decreased energy - - - - - - -  Change in appetite - - - - - - -  Feeling bad or failure about yourself  - - - - - - -  Trouble concentrating - - - - - - -  Moving slowly or fidgety/restless - - - - - - -  Suicidal thoughts - - - - - - -  PHQ-9 Score - - - - - - -     Review of Systems  Constitutional: Negative.   HENT: Negative.    Eyes: Negative.   Respiratory: Negative.    Cardiovascular: Negative.   Gastrointestinal: Negative.   Endocrine: Negative.   Genitourinary: Negative.   Musculoskeletal:  Positive for back pain.  Skin: Negative.   Allergic/Immunologic: Negative.   Neurological: Negative.   Hematological: Negative.   Psychiatric/Behavioral:  Positive for dysphoric mood.       Objective:   Physical Exam Vitals and nursing note reviewed.  Constitutional:      Appearance: Normal appearance.  Cardiovascular:     Rate and Rhythm: Normal rate and regular rhythm.     Pulses: Normal pulses.     Heart sounds: Normal heart sounds.  Pulmonary:     Effort: Pulmonary effort is normal.     Breath sounds: Normal breath sounds.   Musculoskeletal:     Cervical back: Normal range of motion and neck supple.     Comments: Normal Muscle Bulk and Muscle Testing Reveals:  Upper Extremities: Full ROM and Muscle Strength  5/5 Lumbar Hypersensitivity   Bilateral Greater Trochanter Tenderness Lower Extremities: Decreased ROM and Muscle Strength 5/5 Bilateral Lower Extremities Flexion Produces Pain into her Lumbar and Bilateral Lower Extremities Arises from Table Slowly using cane for support Antalgic Gait     Skin:    General: Skin is warm and dry.  Neurological:     Mental Status: She is alert and oriented to person, place, and time.  Psychiatric:        Mood and Affect: Mood normal.        Behavior: Behavior normal.         Assessment & Plan:  1. Cauda equina syndrome: Incontinence: Wearing Depends:Continue to Monitor. 11/19/2021. 2. Insomnia: Continue with Nortriptyline. 11/19/2021 3. 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 Continue current medication regimen. 11/19/2021 Refilled:  Percocet 10/325 mg #120--use 1 tablet every 6 hours as needed and  MS Contin 30 mg one tablet every 12 hours as needed #60. Second scripts sent for the following month We will continue the opioid monitoring program, this consists of regular clinic visits, examinations, urine drug screen, pill counts as well as use of [place] Controlled Substance Reporting system. A 12 month History has been reviewed on the [place] Controlled Substance Reporting System on 11/19/2021. 4. Depression: Continue with Cymbalta. Continue to Monitor. 11/19/2021 5. Anxiety: Continue with current medication regimen with Xanax. Continue to Monitor. 11/19/2021 6.Neuropathy:Continue:with current medication nregimen with  Pamelor. 11/19/2021 7.  Constipation: Continue Senokot. Continue to Monitor. 11/19/2021 8.Muscle Spasm: Continue Baclofen.Continue to Monitor. 11/19/2021 9. Lumbar Radiculitis: Continue  Pamelor. Continue to Monitor. 11/19/2021 10. Bilateral Feet Neuropathic Pain with tingling; . Continue current medication regimen. Continue to monitor. 02/20/20223 11. Bilateral Greater Trochanter Bursitis: Continue to Alternate Ice and Heat Therapy . Continue HEP as Tolerated. Continue to Monitor. 11/19/2021   F/U in 2 months

## 2021-11-20 ENCOUNTER — Other Ambulatory Visit: Payer: Self-pay | Admitting: Registered Nurse

## 2021-11-22 NOTE — Progress Notes (Signed)
11/23/2021 Sherry Wong [id number] Mar 11, 1956   ASSESSMENT AND PLAN:   Diarrhea, unspecified type -     DG Abd 1 View; Future -     Pancreatic elastase, fecal; Future -     Clostridium difficile Toxin B, Qualitative, Real-Time PCR; Future Will get KUB to evaluate for constipation with overflow diarrhea, if this is negative will proceed with stools studies Has been going on since Dec, no fever, chills, unlikely infectious but with history of Cdiff will get stool study.  Some AB bloaing, will get pancreatic elastase.   Nausea without vomiting -     ondansetron (ZOFRAN) 4 MG tablet; Take 1 tablet (4 mg total) by mouth every 8 (eight) hours as needed for nausea or vomiting. Add on PPI over the counter  Lower abdominal pain -     DG Abd 1 View; Future NO FEVER, NO CHILLS Recent labs with PCP in Jan unremarkable ER precautions discussed    Future Appointments  Date Time Provider [place]  01/14/2022 11:00 AM Bayard Hugger, NP [facility]    History of Present Illness:  66 y.o. female  with a past medical history of C. difficile colitis 2021, lymphocytic colitis biopsies March 2021 and others listed below, known to Dr. Silverio Decamp returns to clinic today for evaluation of chronic diarrhea. Patient last seen by Dr. Silverio Decamp 12/25/2020 for chronic diarrhea, that visit patient had tapered off budesonide and experiencing constipation.  Advised MiraLAX purge and then start daily 1 capful MiraLAX, increase fiber and water. Patient states since the week before christmas, every week she will have 2-3 days of not having a BM, then will have 3-4 days of dirrhea. She will have nausea without vomiting at that time, reflux bitter taste. Has lower AB soreness/cramping during this time.  This past Wednesday was having fecal incontinence, large volume with nocturnal symptoms as well. Was having pale stools.  She has been taking imodium.  Patient denies blood in stool, fever, chills,  unintentional weight loss.  Denies close contacts ill, recent camping, recent travel. Patient denies recent antibiotic use.  Previous GI history: 10/25/2020 negative C. difficile and GI stool profile 11/29/2019 negative celiac Colonoscopy December 20, 2019: Right and left colon biopsies consistent with lymphocytic colitis, she was treated with budesonide 9 mg daily for 41-month course.   Developed recurrent diarrhea in November 2021 and was restarted on budesonide 9 mg daily for 3 months, with taper dose   C.diff colitis 08/30/20: Completed 2 weeks of vancomycin    Current Medications:   Current Outpatient Medications (Endocrine & Metabolic):    levothyroxine (SYNTHROID) 137 MCG tablet, Take 137 mcg by mouth daily.  Current Outpatient Medications (Cardiovascular):    simvastatin (ZOCOR) 20 MG tablet, Take 20 mg by mouth daily.  Current Outpatient Medications (Respiratory):    albuterol (PROVENTIL HFA;VENTOLIN HFA) 108 (90 BASE) MCG/ACT inhaler, Inhale 2 puffs into the lungs 2 (two) times daily as needed.   Current Outpatient Medications (Analgesics):    morphine (MS CONTIN) 30 MG 12 hr tablet, TAKE (1) TABLET EVERY TWELVE HOURS. Do Not Fill Before 12/14/2021   oxyCODONE-acetaminophen (PERCOCET) 10-325 MG tablet, Take 1 tablet by mouth every 6 (six) hours as needed for pain. Do Not Fill Before 12/14/2021  Current Outpatient Medications (Hematological):    vitamin B-12 (CYANOCOBALAMIN) 1000 MCG tablet, Take 1,000 mcg by mouth daily.  Current Outpatient Medications (Other):    ALPRAZolam (XANAX) 0.25 MG tablet, TAKE 1 TABLET BY MOUTH TWICE DAILY AS  NEEDED FOR ANXIETY.   Cholecalciferol (VITAMIN D3) 125 MCG (5000 UT) CAPS, Take 2 capsules by mouth daily.   DULoxetine (CYMBALTA) 30 MG capsule, Take by mouth daily. 2 capsules in the morning and 1 in the evenings   hyoscyamine (LEVSIN SL) 0.125 MG SL tablet, Place 1 tablet (0.125 mg total) under the tongue every 8 (eight) hours as needed.    nortriptyline (PAMELOR) 50 MG capsule, TAKE 1 TABLET BY MOUTH AT BEDTIME.   omeprazole (PRILOSEC) 40 MG capsule, Take 1 capsule (40 mg total) by mouth daily.   ondansetron (ZOFRAN) 4 MG tablet, Take 1 tablet (4 mg total) by mouth every 8 (eight) hours as needed for nausea or vomiting.  Surgical History:  She  has a past surgical history that includes cysts removed from ovary; Cholecystectomy; Dilation and curettage of uterus; Breast biopsy; Colonoscopy (2009); Esophagogastroduodenoscopy; Lumbar laminectomy/decompression microdiscectomy (12/03/2011); Transforaminal lumbar interbody fusion (tlif) with pedicle screw fixation 2 level (N/A, 08/25/2013); Hematoma evacuation (N/A, 08/25/2013); Placement of lumbar drain (N/A, 09/01/2013); Lumbar wound debridement (N/A, 09/01/2013); Elbow surgery (Left); and Breast lumpectomy (Left, 2014). Family History:  Her family history includes Anesthesia problems in her mother and sister; COPD in her mother; Diabetes in her mother. Social History:   reports that she has never smoked. She has never used smokeless tobacco. She reports current alcohol use. She reports that she does not use drugs.  Current Medications, Allergies, Past Medical History, Past Surgical History, Family History and Social History were reviewed in [employer] record.  Physical Exam: BP 130/86 (BP Location: Left Arm, Patient Position: Sitting, Cuff Size: Normal)    Pulse 84    Ht 5' 3.75" (1.619 m)    Wt 229 lb 8 oz (104.1 kg)    BMI 39.70 kg/m  General:   Pleasant, well developed female in no acute distress Eyes: sclerae anicteric,conjunctive pink  Heart:  regular rate and rhythm Pulm: Clear anteriorly; no wheezing Abdomen:  Soft, Obese AB, skin exam normal, Sluggish bowel sounds. mild tenderness in the LUQ and in the LLQ. Without guarding and Without rebound, without hepatomegaly. Extremities:  Without edema. Peripheral pulses intact.  Neurologic:  Alert and  oriented  x4;  grossly normal neurologically. Walks with a cane.  Skin:   Dry and intact without significant lesions or rashes. Psychiatric: Demonstrates good judgement and reason without abnormal affect or behaviors.  Vladimir Crofts, PA-C 11/23/21

## 2021-11-23 ENCOUNTER — Other Ambulatory Visit: Payer: Medicare Other

## 2021-11-23 ENCOUNTER — Ambulatory Visit: Payer: Medicare Other | Admitting: Physician Assistant

## 2021-11-23 ENCOUNTER — Other Ambulatory Visit: Payer: Self-pay

## 2021-11-23 ENCOUNTER — Ambulatory Visit (INDEPENDENT_AMBULATORY_CARE_PROVIDER_SITE_OTHER)
Admission: RE | Admit: 2021-11-23 | Discharge: 2021-11-23 | Disposition: A | Discharge status: Home / Self Care | Payer: Medicare Other | Source: Ambulatory Visit | Attending: Physician Assistant | Admitting: Physician Assistant

## 2021-11-23 ENCOUNTER — Encounter: Payer: Self-pay | Admitting: Physician Assistant

## 2021-11-23 VITALS — BP 130/86 | HR 84 | Ht 63.75 in | Wt 229.5 lb

## 2021-11-23 DIAGNOSIS — R1030 Lower abdominal pain, unspecified: Secondary | ICD-10-CM

## 2021-11-23 DIAGNOSIS — R110 Nausea: Secondary | ICD-10-CM | POA: Diagnosis not present

## 2021-11-23 DIAGNOSIS — R112 Nausea with vomiting, unspecified: Secondary | ICD-10-CM | POA: Diagnosis not present

## 2021-11-23 DIAGNOSIS — R197 Diarrhea, unspecified: Secondary | ICD-10-CM

## 2021-11-23 MED ORDER — ONDANSETRON HCL 4 MG PO TABS: 4.0000 mg | ORAL_TABLET | Freq: Three times a day (TID) | ORAL | 0 refills | Status: DC | PRN

## 2021-11-23 NOTE — Patient Instructions (Addendum)
If you are age 67 or older, your body mass index should be between 23-30. Your Body mass index is 39.7 kg/m. If this is out of the aforementioned range listed, please consider follow up with your Primary Care Provider.  If you are age 58 or younger, your body mass index should be between 19-25. Your Body mass index is 39.7 kg/m. If this is out of the aformentioned range listed, please consider follow up with your Primary Care Provider.   Your provider has requested that you have an abdominal x ray before leaving today. Please go to the basement floor to our Radiology department for the test.   Your provider has requested that you go to the basement level for lab work before leaving today. Press "B" on the elevator. The lab is located at the first door on the left as you exit the elevator.   The [place] [facility] providers would like to encourage you to use [facility] to communicate with providers for non-urgent requests or questions.  Due to long hold times on the telephone, sending your provider a message by [facility] may be a faster and more efficient way to get a response.  Please allow 48 business hours for a response.  Please remember that this is for non-urgent requests.   Can try enema this weekend Take zofran as needed  DO NOT DO STOOL SAMPLES UNTIL AFTER XRAY RESULTS  Please go to the ER if you have any severe AB pain, unable to hold down food/water, blood in stool or vomit, chest pain, shortness of breath, or any worsening symptoms.    I believe your loose stools may be due to something called overflow diarrhea. If you predominantly have constipation, straining or incomplete bowel movements at times the only thing to get through the large amounts of stool is liquid. This can cause someone to feel like they are having diarrhea yet actually they have too much backup of stool. We can evaluate this with a rectal exam, and x-ray of your abdomen, or CT scan. If this is the case usually doing a  combination of Benefiber 1-2 times daily with MiraLAX 17 g daily can be helpful. At times there are medications that can also be helpful for this but generally I start with over-the-counter I also like somebody to get a squatty potty to help straighten out the colon whether having a bowel movement. Sometimes we will schedule for pelvic floor physical therapy if you are having incomplete bowel movements and failure pelvic floor is weak and contributing to the constipation.   Benefiber or Citracel is good for constipation/diarrhea/irritable bowel syndrome, it helps with weight loss and can help lower your bad cholesterol. Please do 1 TBSP in the morning in water, coffee, or tea up to twice a day. It can take up to a month before you can see a difference with your bowel movements. It is cheapest from [employer], [employer], [employer].   Miralax is an osmotic laxative.  It only brings more water into the stool.  This is safe to take daily.  Can take up to 17 gram of miralax twice a day.  Mix with juice or coffee.  Start 1 capful at night for 3-4 days and reassess your response in 3-4 days.  You can increase and decrease the dose based on your response.  Remember, it can take up to 3-4 days to take effect OR for the effects to wear off.   I often pair this with benefiber in the morning  to help assure the stool is not too loose.   Due to recent changes in healthcare laws, you may see the results of your imaging and laboratory studies on MyChart before your provider has had a chance to review them.  We understand that in some cases there may be results that are confusing or concerning to you. Not all laboratory results come back in the same time frame and the provider may be waiting for multiple results in order to interpret others.  Please give [place] 48 hours in order for your provider to thoroughly review all the results before contacting the office for clarification of your results.   It was a pleasure to see you  today!  Thank you for trusting me with your gastrointestinal care!    Vicie Mutters , PA-C

## 2021-12-10 ENCOUNTER — Telehealth: Payer: Self-pay

## 2021-12-10 NOTE — Telephone Encounter (Signed)
Spoke with patient regarding results & recommendations. Patient verbalized understanding, no further questions. ?

## 2021-12-20 ENCOUNTER — Telehealth: Payer: Self-pay

## 2021-12-20 DIAGNOSIS — E039 Hypothyroidism, unspecified: Secondary | ICD-10-CM | POA: Diagnosis not present

## 2021-12-20 DIAGNOSIS — M5136 Other intervertebral disc degeneration, lumbar region: Secondary | ICD-10-CM

## 2021-12-20 DIAGNOSIS — R609 Edema, unspecified: Secondary | ICD-10-CM | POA: Diagnosis not present

## 2021-12-20 DIAGNOSIS — Z1231 Encounter for screening mammogram for malignant neoplasm of breast: Secondary | ICD-10-CM | POA: Diagnosis not present

## 2021-12-20 DIAGNOSIS — R300 Dysuria: Secondary | ICD-10-CM | POA: Diagnosis not present

## 2021-12-20 DIAGNOSIS — E7849 Other hyperlipidemia: Secondary | ICD-10-CM | POA: Diagnosis not present

## 2021-12-20 DIAGNOSIS — K580 Irritable bowel syndrome with diarrhea: Secondary | ICD-10-CM | POA: Diagnosis not present

## 2021-12-20 DIAGNOSIS — G834 Cauda equina syndrome: Secondary | ICD-10-CM

## 2021-12-20 DIAGNOSIS — I10 Essential (primary) hypertension: Secondary | ICD-10-CM | POA: Diagnosis not present

## 2021-12-20 MED ORDER — MORPHINE SULFATE ER 30 MG PO TBCR: EXTENDED_RELEASE_TABLET | ORAL | 0 refills | Status: DC

## 2021-12-20 NOTE — Telephone Encounter (Signed)
Patient called and stated Sherry Wong does not have Morphine 30 mg. [facility], per patient's request, and they have it in stock. Morphine cancelled at [facility]. [facility] added to [employer]. Patient notified ?

## 2021-12-20 NOTE — Telephone Encounter (Signed)
PMP was Reviewed.  ?Morphine e-scribed to [facility].  ?Ms. Gemme is aware. ?

## 2022-01-14 ENCOUNTER — Encounter: Payer: Self-pay | Admitting: Registered Nurse

## 2022-01-14 ENCOUNTER — Encounter: Payer: Medicare Other | Attending: Physical Medicine and Rehabilitation | Admitting: Registered Nurse

## 2022-01-14 VITALS — BP 147/89 | HR 85 | Ht 63.75 in | Wt 236.4 lb

## 2022-01-14 DIAGNOSIS — F3289 Other specified depressive episodes: Secondary | ICD-10-CM | POA: Diagnosis present

## 2022-01-14 DIAGNOSIS — F411 Generalized anxiety disorder: Secondary | ICD-10-CM | POA: Insufficient documentation

## 2022-01-14 DIAGNOSIS — M5136 Other intervertebral disc degeneration, lumbar region: Secondary | ICD-10-CM | POA: Insufficient documentation

## 2022-01-14 DIAGNOSIS — M7061 Trochanteric bursitis, right hip: Secondary | ICD-10-CM | POA: Insufficient documentation

## 2022-01-14 DIAGNOSIS — M5416 Radiculopathy, lumbar region: Secondary | ICD-10-CM | POA: Diagnosis not present

## 2022-01-14 DIAGNOSIS — Z5181 Encounter for therapeutic drug level monitoring: Secondary | ICD-10-CM | POA: Diagnosis not present

## 2022-01-14 DIAGNOSIS — G834 Cauda equina syndrome: Secondary | ICD-10-CM | POA: Insufficient documentation

## 2022-01-14 DIAGNOSIS — G894 Chronic pain syndrome: Secondary | ICD-10-CM | POA: Insufficient documentation

## 2022-01-14 DIAGNOSIS — M961 Postlaminectomy syndrome, not elsewhere classified: Secondary | ICD-10-CM | POA: Insufficient documentation

## 2022-01-14 DIAGNOSIS — M7062 Trochanteric bursitis, left hip: Secondary | ICD-10-CM | POA: Diagnosis not present

## 2022-01-14 DIAGNOSIS — Z79891 Long term (current) use of opiate analgesic: Secondary | ICD-10-CM | POA: Diagnosis not present

## 2022-01-14 MED ORDER — MORPHINE SULFATE ER 30 MG PO TBCR: EXTENDED_RELEASE_TABLET | ORAL | 0 refills | Status: DC

## 2022-01-14 MED ORDER — OXYCODONE-ACETAMINOPHEN 10-325 MG PO TABS: 1.0000 | ORAL_TABLET | Freq: Four times a day (QID) | ORAL | 0 refills | Status: DC | PRN

## 2022-01-14 MED ORDER — ALPRAZOLAM 0.25 MG PO TABS: ORAL_TABLET | ORAL | 2 refills | Status: DC

## 2022-01-14 MED ORDER — NORTRIPTYLINE HCL 50 MG PO CAPS: ORAL_CAPSULE | ORAL | 5 refills | Status: DC

## 2022-01-14 NOTE — Progress Notes (Signed)
? ?Subjective:  ? ? Patient ID: Sherry Wong, female    DOB: 03-05-1956, 66 y.o.   MRN: [id number] ? ?HPI: Sherry Wong is a 66 y.o. female who returns for follow up appointment for chronic pain and medication refill. She states her pain is located in  her lower back radiating into her bilateral lower extremities and bilateral feet. She rates her pain 5. Her current exercise regime is walking,performing stretching exercises and attending the [employer] two days a week.  ? ?Ms. Eisenberg Morphine equivalent is 120.00 MME. She  is also prescribed Alprazolam.We have discussed the black box warning of using opioids and benzodiazepines. I highlighted the dangers of using these drugs together and discussed the adverse events including respiratory suppression, overdose, cognitive impairment and importance of compliance with current regimen. We will continue to monitor and adjust as indicated.  ? ?  Last Oral Swab was Performed on 07/19/2021, it was consistent.  ?  ? ?Pain Inventory ?Average Pain 6 ?Pain Right Now 5 ?My pain is sharp, burning, dull, stabbing, tingling, and aching ? ?In the last 24 hours, has pain interfered with the following? ?General activity 9 ?Relation with others 10 ?Enjoyment of life 10 ?What TIME of day is your pain at its worst? morning , daytime, evening, and night ?Sleep (in general) Fair ? ?Pain is worse with: walking, bending, sitting, standing, and some activites ?Pain improves with: rest, heat/ice, and medication ?Relief from Meds: 4 ? ?Family History  ?Problem Relation Age of Onset  ? Anesthesia problems Mother   ? COPD Mother   ? Diabetes Mother   ? Anesthesia problems Sister   ? Hypotension Neg Hx   ? Malignant hyperthermia Neg Hx   ? Pseudochol deficiency Neg Hx   ? Colon cancer Neg Hx   ? Esophageal cancer Neg Hx   ? Liver disease Neg Hx   ? Pancreatic cancer Neg Hx   ? ?Social History  ? ?Socioeconomic History  ? Marital status: Married  ?  Spouse name: Not on file  ? Number of children: 0   ? Years of education: 52  ? Highest education level: Not on file  ?Occupational History  ? Occupation: Disabled  ?Tobacco Use  ? Smoking status: Never  ? Smokeless tobacco: Never  ?Vaping Use  ? Vaping Use: Never used  ?Substance and Sexual Activity  ? Alcohol use: Yes  ?  Alcohol/week: 0.0 standard drinks  ?  Comment: occ- 0.5 can beer every 2 months  ? Drug use: No  ? Sexual activity: Not Currently  ?Other Topics Concern  ? Not on file  ?Social History Narrative  ? Lives at home with husband.  ? Caffeine use: 1 cup coffee 3 times per week.   ? ?Social Determinants of Health  ? ?Financial Resource Strain: Not on file  ?Food Insecurity: Not on file  ?Transportation Needs: Not on file  ?Physical Activity: Not on file  ?Stress: Not on file  ?Social Connections: Not on file  ? ?Past Surgical History:  ?Procedure Laterality Date  ? BREAST BIOPSY    ? right  ? BREAST LUMPECTOMY Left 2014  ? CHOLECYSTECTOMY    ? "gangrene in small intestines clipped by surgeon" per patient  ? COLONOSCOPY  2009  ? Dr Rowe Pavy   ? cysts removed from ovary    ? DILATION AND CURETTAGE OF UTERUS    ? ELBOW SURGERY Left   ? ESOPHAGOGASTRODUODENOSCOPY    ? HEMATOMA EVACUATION N/A 08/25/2013  ?  Procedure: EVACUATION OF LUMBAR HEMATOMA;  Surgeon: Floyce Stakes, MD;  Location: [facility];  Service: Neurosurgery;  Laterality: N/A;  ? LUMBAR LAMINECTOMY/DECOMPRESSION MICRODISCECTOMY  12/03/2011  ? Procedure: LUMBAR LAMINECTOMY/DECOMPRESSION MICRODISCECTOMY 2 LEVELS;  Surgeon: Floyce Stakes, MD;  Location: [place] [facility];  Service: Neurosurgery;  Laterality: Right;  Right Lumbar four-five Lumbar five sacral one Foraminotomies  ? LUMBAR WOUND DEBRIDEMENT N/A 09/01/2013  ? Procedure: LUMBAR WOUND DEBRIDEMENT;  Surgeon: Floyce Stakes, MD;  Location: [place] [facility];  Service: Neurosurgery;  Laterality: N/A;  ? PLACEMENT OF LUMBAR DRAIN N/A 09/01/2013  ? Procedure: PLACEMENT OF LUMBAR DRAIN;  Surgeon: Floyce Stakes, MD;  Location: [place] [facility];   Service: Neurosurgery;  Laterality: N/A;  ? TRANSFORAMINAL LUMBAR INTERBODY FUSION (TLIF) WITH PEDICLE SCREW FIXATION 2 LEVEL N/A 08/25/2013  ? Procedure:  Lumbar four-five, Lumbar five-Sacral one Transforaminal Lumbar Interbody Fusion ;  Surgeon: Floyce Stakes, MD;  Location: [facility];  Service: Neurosurgery;  Laterality: N/A;  ? ?Past Surgical History:  ?Procedure Laterality Date  ? BREAST BIOPSY    ? right  ? BREAST LUMPECTOMY Left 2014  ? CHOLECYSTECTOMY    ? "gangrene in small intestines clipped by surgeon" per patient  ? COLONOSCOPY  2009  ? Dr Rowe Pavy   ? cysts removed from ovary    ? DILATION AND CURETTAGE OF UTERUS    ? ELBOW SURGERY Left   ? ESOPHAGOGASTRODUODENOSCOPY    ? HEMATOMA EVACUATION N/A 08/25/2013  ? Procedure: EVACUATION OF LUMBAR HEMATOMA;  Surgeon: Floyce Stakes, MD;  Location: [facility];  Service: Neurosurgery;  Laterality: N/A;  ? LUMBAR LAMINECTOMY/DECOMPRESSION MICRODISCECTOMY  12/03/2011  ? Procedure: LUMBAR LAMINECTOMY/DECOMPRESSION MICRODISCECTOMY 2 LEVELS;  Surgeon: Floyce Stakes, MD;  Location: [place] [facility];  Service: Neurosurgery;  Laterality: Right;  Right Lumbar four-five Lumbar five sacral one Foraminotomies  ? LUMBAR WOUND DEBRIDEMENT N/A 09/01/2013  ? Procedure: LUMBAR WOUND DEBRIDEMENT;  Surgeon: Floyce Stakes, MD;  Location: [place] [facility];  Service: Neurosurgery;  Laterality: N/A;  ? PLACEMENT OF LUMBAR DRAIN N/A 09/01/2013  ? Procedure: PLACEMENT OF LUMBAR DRAIN;  Surgeon: Floyce Stakes, MD;  Location: [place] [facility];  Service: Neurosurgery;  Laterality: N/A;  ? TRANSFORAMINAL LUMBAR INTERBODY FUSION (TLIF) WITH PEDICLE SCREW FIXATION 2 LEVEL N/A 08/25/2013  ? Procedure:  Lumbar four-five, Lumbar five-Sacral one Transforaminal Lumbar Interbody Fusion ;  Surgeon: Floyce Stakes, MD;  Location: [facility];  Service: Neurosurgery;  Laterality: N/A;  ? ?Past Medical History:  ?Diagnosis Date  ? Anxiety   ? takes celexa daily  ? Arthritis   ? Asthma   ? Bronchitis    ? Chronic back pain   ? ddd/lumbago,and radiculopathy  ? COVID-19   ? Dec 2021  ? Depression   ? Gastric ulcer   ? GERD (gastroesophageal reflux disease)   ? takes Protonix daily  ? Gout   ? hx of  ? H/O hiatal hernia   ? Hx of colonic polyps   ? Hypertension   ? takes Losartan daily  ? Hypothyroidism   ? takes Levothyroxine daily  ? IBS (irritable bowel syndrome)   ? Nocturia   ? Pneumonia   ? hx of 15+yrs ago  ? PONV (postoperative nausea and vomiting)   ? 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 Dr. Juliann Mule  ? Ulcerative colitis   ? Urinary incontinence   ? ?BP (!) 147/89   Pulse 85   Ht 5'  3.75" (1.619 m)   Wt 236 lb 6.4 oz (107.2 kg)   SpO2 97%   BMI 40.90 kg/m?  ? ?Opioid Risk Score:   ?Fall Risk Score:  `1 ? ?Depression screen PHQ 2/9 ? ? ?  01/14/2022  ? 11:08 AM 11/19/2021  ? 12:50 PM 09/18/2021  ?  2:58 PM 07/19/2021  ?  2:15 PM 05/24/2021  ?  1:08 PM 03/22/2021  ? 10:12 AM 01/19/2021  ?  1:16 PM  ?Depression screen PHQ 2/9  ?Decreased Interest '3 3 1 '$ 0 0 1 0  ?Down, Depressed, Hopeless '3 3 1 '$ 0 0 1 0  ?PHQ - 2 Score '6 6 2 '$ 0 0 2 0  ?  ? ?Review of Systems  ?Constitutional: Negative.   ?HENT: Negative.    ?Eyes: Negative.   ?Respiratory: Negative.    ?Cardiovascular: Negative.   ?Gastrointestinal: Negative.   ?Endocrine: Negative.   ?Genitourinary: Negative.   ?Musculoskeletal:  Positive for back pain.  ?Skin: Negative.   ?Allergic/Immunologic: Negative.   ?Neurological: Negative.   ?Hematological: Negative.   ?Psychiatric/Behavioral: Negative.    ? ?   ?Objective:  ? Physical Exam ?Vitals and nursing note reviewed.  ?Constitutional:   ?   Appearance: Normal appearance.  ?Cardiovascular:  ?   Rate and Rhythm: Normal rate and regular rhythm.  ?   Pulses: Normal pulses.  ?   Heart sounds: Normal heart sounds.  ?Pulmonary:  ?   Effort: Pulmonary effort is normal.  ?   Breath sounds: Normal breath sounds.  ?Musculoskeletal:  ?   Cervical back: Normal range of motion and neck supple.  ?   Comments:  Normal Muscle Bulk and Muscle Testing Reveals:  ?Upper Extremities: Full ROM and Muscle Strength 5/5 ?Bilateral AC Joint Tenderness ?Lumbar Paraspinal Tenderness: L-3-L-5 ?Bilateral Greater Trochanter Tenderness

## 2022-01-30 ENCOUNTER — Ambulatory Visit: Payer: Medicare Other | Admitting: Gastroenterology

## 2022-01-30 ENCOUNTER — Encounter: Payer: Self-pay | Admitting: Gastroenterology

## 2022-01-30 VITALS — BP 120/80 | HR 80 | Ht 64.5 in | Wt 229.0 lb

## 2022-01-30 DIAGNOSIS — R1084 Generalized abdominal pain: Secondary | ICD-10-CM | POA: Diagnosis not present

## 2022-01-30 DIAGNOSIS — R198 Other specified symptoms and signs involving the digestive system and abdomen: Secondary | ICD-10-CM | POA: Diagnosis not present

## 2022-01-30 DIAGNOSIS — K52839 Microscopic colitis, unspecified: Secondary | ICD-10-CM | POA: Diagnosis not present

## 2022-01-30 NOTE — Progress Notes (Signed)
? ?       ? ?Sherry Wong    [id number]    08-31-1956 ? ?Primary Care Physician:Skillman, Aundra Millet, PA-C ? ?Referring Physician: Rosalee Kaufman, PA-C ?[address] ?[place],  [place] [id number] ? ? ?Chief complaint:  Constipation alternating with diarrhea ? ?HPI: ? ?Sherry Wong is a very pleasant 66 year old here for follow-up visit for lymphocytic colitis. ? ?She completed budesonide taper dose.  She is experiencing alternating constipation and diarrhea.  She feels every time she eats she has to use the bathroom, has fecal urgency soon after a meal.  She is afraid to go out to restaurants. ? ?She is currently not taking any laxatives.  She is experiencing lower abdominal discomfort and fullness ?She is using dicyclomine as needed with improvement of abdominal cramping ?  ?Denies any vomiting, melena or rectal bleeding. ?  ?Relevant GI history: ?Colonoscopy December 20, 2019: Right and left colon biopsies consistent with lymphocytic colitis, she was treated with budesonide 9 mg daily for 22-monthcourse. ?  ?Developed recurrent diarrhea in November 2021 and was restarted on budesonide 9 mg daily for 3 months, with taper dose ?  ?C.diff colitis 08/30/20: Completed 2 weeks of vancomycin ? ? ?Outpatient Encounter Medications as of 01/30/2022  ?Medication Sig  ? albuterol (PROVENTIL HFA;VENTOLIN HFA) 108 (90 BASE) MCG/ACT inhaler Inhale 2 puffs into the lungs 2 (two) times daily as needed.   ? ALPRAZolam (XANAX) 0.25 MG tablet TAKE 1 TABLET BY MOUTH TWICE DAILY AS NEEDED FOR ANXIETY.  ? Cholecalciferol (VITAMIN D3) 125 MCG (5000 UT) CAPS Take 2 capsules by mouth daily.  ? DULoxetine (CYMBALTA) 30 MG capsule Take by mouth daily. 2 capsules in the morning and 1 in the evenings  ? hyoscyamine (LEVSIN SL) 0.125 MG SL tablet Place 1 tablet (0.125 mg total) under the tongue every 8 (eight) hours as needed.  ? levothyroxine (SYNTHROID) 137 MCG tablet Take 137 mcg by mouth daily.  ? morphine (MS CONTIN) 30 MG 12 hr tablet  TAKE (1) TABLET EVERY TWELVE HOURS. Do Not Fill Before 02/16/2022  ? nortriptyline (PAMELOR) 50 MG capsule TAKE 1 TABLET BY MOUTH AT BEDTIME.  ? omeprazole (PRILOSEC) 40 MG capsule Take 1 capsule (40 mg total) by mouth daily.  ? ondansetron (ZOFRAN) 4 MG tablet Take 1 tablet (4 mg total) by mouth every 8 (eight) hours as needed for nausea or vomiting.  ? oxyCODONE-acetaminophen (PERCOCET) 10-325 MG tablet Take 1 tablet by mouth every 6 (six) hours as needed for pain. Do Not Fill Before 02/13/2022  ? simvastatin (ZOCOR) 20 MG tablet Take 20 mg by mouth daily.  ? vitamin B-12 (CYANOCOBALAMIN) 1000 MCG tablet Take 1,000 mcg by mouth daily.  ? ?No facility-administered encounter medications on file as of 01/30/2022.  ? ? ?Allergies as of 01/30/2022 - Review Complete 01/30/2022  ?Allergen Reaction Noted  ? Codeine Nausea Only and Other (See Comments) 06/21/2011  ? Tegretol [carbamazepine] Other (See Comments) 07/24/2017  ? ? ?Past Medical History:  ?Diagnosis Date  ? Anxiety   ? takes celexa daily  ? Arthritis   ? Asthma   ? Bronchitis   ? Chronic back pain   ? ddd/lumbago,and radiculopathy  ? COVID-19   ? Dec 2021  ? Depression   ? Gastric ulcer   ? GERD (gastroesophageal reflux disease)   ? takes Protonix daily  ? Gout   ? hx of  ? H/O hiatal hernia   ? Hx of colonic polyps   ?  Hypertension   ? takes Losartan daily  ? Hypothyroidism   ? takes Levothyroxine daily  ? IBS (irritable bowel syndrome)   ? Nocturia   ? Pneumonia   ? hx of 15+yrs ago  ? PONV (postoperative nausea and vomiting)   ? 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 Dr. Juliann Mule  ? Ulcerative colitis   ? Urinary incontinence   ? ? ?Past Surgical History:  ?Procedure Laterality Date  ? BREAST BIOPSY    ? right  ? BREAST LUMPECTOMY Left 2014  ? CHOLECYSTECTOMY    ? "gangrene in small intestines clipped by surgeon" per patient  ? COLONOSCOPY  2009  ? Dr Rowe Pavy   ? cysts removed from ovary    ? DILATION AND CURETTAGE OF UTERUS    ? ELBOW SURGERY Left    ? ESOPHAGOGASTRODUODENOSCOPY    ? HEMATOMA EVACUATION N/A 08/25/2013  ? Procedure: EVACUATION OF LUMBAR HEMATOMA;  Surgeon: Floyce Stakes, MD;  Location: [facility];  Service: Neurosurgery;  Laterality: N/A;  ? LUMBAR LAMINECTOMY/DECOMPRESSION MICRODISCECTOMY  12/03/2011  ? Procedure: LUMBAR LAMINECTOMY/DECOMPRESSION MICRODISCECTOMY 2 LEVELS;  Surgeon: Floyce Stakes, MD;  Location: [place] [facility];  Service: Neurosurgery;  Laterality: Right;  Right Lumbar four-five Lumbar five sacral one Foraminotomies  ? LUMBAR WOUND DEBRIDEMENT N/A 09/01/2013  ? Procedure: LUMBAR WOUND DEBRIDEMENT;  Surgeon: Floyce Stakes, MD;  Location: [place] [facility];  Service: Neurosurgery;  Laterality: N/A;  ? PLACEMENT OF LUMBAR DRAIN N/A 09/01/2013  ? Procedure: PLACEMENT OF LUMBAR DRAIN;  Surgeon: Floyce Stakes, MD;  Location: [place] [facility];  Service: Neurosurgery;  Laterality: N/A;  ? TRANSFORAMINAL LUMBAR INTERBODY FUSION (TLIF) WITH PEDICLE SCREW FIXATION 2 LEVEL N/A 08/25/2013  ? Procedure:  Lumbar four-five, Lumbar five-Sacral one Transforaminal Lumbar Interbody Fusion ;  Surgeon: Floyce Stakes, MD;  Location: [facility];  Service: Neurosurgery;  Laterality: N/A;  ? ? ?Family History  ?Problem Relation Age of Onset  ? Anesthesia problems Mother   ? COPD Mother   ? Diabetes Mother   ? Anesthesia problems Sister   ? Hypotension Neg Hx   ? Malignant hyperthermia Neg Hx   ? Pseudochol deficiency Neg Hx   ? Colon cancer Neg Hx   ? Esophageal cancer Neg Hx   ? Liver disease Neg Hx   ? Pancreatic cancer Neg Hx   ? ? ?Social History  ? ?Socioeconomic History  ? Marital status: Married  ?  Spouse name: Not on file  ? Number of children: 0  ? Years of education: 61  ? Highest education level: Not on file  ?Occupational History  ? Occupation: Disabled  ?Tobacco Use  ? Smoking status: Never  ? Smokeless tobacco: Never  ?Vaping Use  ? Vaping Use: Never used  ?Substance and Sexual Activity  ? Alcohol use: Yes  ?  Alcohol/week: 0.0 standard  drinks  ?  Comment: occ- 0.5 can beer every 2 months  ? Drug use: No  ? Sexual activity: Not Currently  ?Other Topics Concern  ? Not on file  ?Social History Narrative  ? Lives at home with husband.  ? Caffeine use: 1 cup coffee 3 times per week.   ? ?Social Determinants of Health  ? ?Financial Resource Strain: Not on file  ?Food Insecurity: Not on file  ?Transportation Needs: Not on file  ?Physical Activity: Not on file  ?Stress: Not on file  ?Social Connections: Not on file  ?Intimate Partner Violence: Not on file  ? ? ? ? ?  Review of systems: ?All other review of systems negative except as mentioned in the HPI. ? ? ?Physical Exam: ?Vitals:  ? 01/30/22 0936  ?BP: 120/80  ?Pulse: 80  ? ?Body mass index is 38.7 kg/m?. ?Gen:      No acute distress ?HEENT:  sclera anicteric ?Abd:      soft, non-tender; no palpable masses, no distension ?Ext:    No edema ?Neuro: alert and oriented x 3 ?Psych: normal mood and affect ? ?Data Reviewed: ? ?Reviewed labs, radiology imaging, old records and pertinent past GI work up ? ? ?Assessment and Plan/Recommendations: ? ?66 year old very pleasant female here for follow-up visit for persistent chronic diarrhea.  She has history of  lymphocytic colitis. ? ?Last colonoscopy in March 2021 was inadequate prep.  Plan for repeat colonoscopy with biopsies to exclude recurrent lymphocytic colitis versus overflow diarrhea exacerbated by underlying irritable bowel syndrome with alternating constipation and diarrhea  ?The risks and benefits as well as alternatives of endoscopic procedure(s) have been discussed and reviewed. All questions answered. The patient agrees to proceed. ? ? ?Use MiraLAX 1 capful twice daily  ?Continue Benefiber 1 teaspoon 3 times daily with meals  ?8 to 10 cups of water daily  ?Patient will need extended bowel prep for 2 days prior to the procedure  ? ?Further recommendations based on colonoscopy findings  ? ?Return after colonoscopy  ? ? The patient was provided an  opportunity to ask questions and all were answered. The patient agreed with the plan and demonstrated an understanding of the instructions. ? ?K. Denzil Magnuson , MD ?  ? ?CC: Rosalee Kaufman, * ? ? ?

## 2022-01-30 NOTE — Patient Instructions (Signed)
You have been scheduled for a colonoscopy. Please follow written instructions given to you at your visit today.  ?Please pick up your prep supplies at the pharmacy within the next 1-3 days. ?If you use inhalers (even only as needed), please bring them with you on the day of your procedure.  2 day prep  ? ?Start Miralax 1 capful twice a day in juice or water  ? ? ?Due to recent changes in healthcare laws, you may see the results of your imaging and laboratory studies on MyChart before your provider has had a chance to review them.  We understand that in some cases there may be results that are confusing or concerning to you. Not all laboratory results come back in the same time frame and the provider may be waiting for multiple results in order to interpret others.  Please give [place] 48 hours in order for your provider to thoroughly review all the results before contacting the office for clarification of your results.   ? ?If you are age 26 or older, your body mass index should be between 23-30. Your Body mass index is 38.7 kg/m?[name] If this is out of the aforementioned range listed, please consider follow up with your Primary Care Provider. ? ?If you are age 65 or younger, your body mass index should be between 19-25. Your Body mass index is 38.7 kg/m?[name] If this is out of the aformentioned range listed, please consider follow up with your Primary Care Provider.  ? ?________________________________________________________ ? ?The [facility] providers would like to encourage you to use [facility] to communicate with providers for non-urgent requests or questions.  Due to long hold times on the telephone, sending your provider a message by [facility] may be a faster and more efficient way to get a response.  Please allow 48 business hours for a response.  Please remember that this is for non-urgent requests.  ?_______________________________________________________  ? ?I appreciate the  opportunity to care for you ? ?Thank You   ? ?Harl Bowie , MD  ? ?

## 2022-02-05 ENCOUNTER — Encounter: Payer: Self-pay | Admitting: Gastroenterology

## 2022-02-06 ENCOUNTER — Ambulatory Visit (AMBULATORY_SURGERY_CENTER): Payer: Medicare Other | Admitting: Gastroenterology

## 2022-02-06 ENCOUNTER — Encounter: Payer: Self-pay | Admitting: Gastroenterology

## 2022-02-06 VITALS — BP 131/82 | HR 70 | Temp 98.0°F | Resp 9 | Ht 64.0 in | Wt 229.0 lb

## 2022-02-06 DIAGNOSIS — K52832 Lymphocytic colitis: Secondary | ICD-10-CM | POA: Diagnosis not present

## 2022-02-06 DIAGNOSIS — K529 Noninfective gastroenteritis and colitis, unspecified: Secondary | ICD-10-CM

## 2022-02-06 DIAGNOSIS — R197 Diarrhea, unspecified: Secondary | ICD-10-CM | POA: Diagnosis not present

## 2022-02-06 MED ORDER — SODIUM CHLORIDE 0.9 % IV SOLN: 500.0000 mL | Freq: Once | INTRAVENOUS | Status: DC

## 2022-02-06 NOTE — Patient Instructions (Signed)
YOU HAD AN ENDOSCOPIC PROCEDURE TODAY AT THE [facility]:   Refer to the procedure report that was given to you for any specific questions about what was found during the examination.  If the procedure report does not answer your questions, please call your gastroenterologist to clarify.  If you requested that your care partner not be given the details of your procedure findings, then the procedure report has been included in a sealed envelope for you to review at your convenience later.  YOU SHOULD EXPECT: Some feelings of bloating in the abdomen. Passage of more gas than usual.  Walking can help get rid of the air that was put into your GI tract during the procedure and reduce the bloating. If you had a lower endoscopy (such as a colonoscopy or flexible sigmoidoscopy) you may notice spotting of blood in your stool or on the toilet paper. If you underwent a bowel prep for your procedure, you may not have a normal bowel movement for a few days.  Please Note:  You might notice some irritation and congestion in your nose or some drainage.  This is from the oxygen used during your procedure.  There is no need for concern and it should clear up in a day or so.  SYMPTOMS TO REPORT IMMEDIATELY:   Following lower endoscopy (colonoscopy or flexible sigmoidoscopy):  Excessive amounts of blood in the stool  Significant tenderness or worsening of abdominal pains  Swelling of the abdomen that is new, acute  Fever of 100F or higher   Following upper endoscopy (EGD)  Vomiting of blood or coffee ground material  New chest pain or pain under the shoulder blades  Painful or persistently difficult swallowing  New shortness of breath  Fever of 100F or higher  Black, tarry-looking stools  For urgent or emergent issues, a gastroenterologist can be reached at any hour by calling [phone]. Do not use MyChart messaging for urgent concerns.    DIET:  We do recommend a small meal at first, but  then you may proceed to your regular diet.  Drink plenty of fluids but you should avoid alcoholic beverages for 24 hours.  ACTIVITY:  You should plan to take it easy for the rest of today and you should NOT DRIVE or use heavy machinery until tomorrow (because of the sedation medicines used during the test).    FOLLOW UP: Our staff will call the number listed on your records 48-72 hours following your procedure to check on you and address any questions or concerns that you may have regarding the information given to you following your procedure. If we do not reach you, we will leave a message.  We will attempt to reach you two times.  During this call, we will ask if you have developed any symptoms of COVID 19. If you develop any symptoms (ie: fever, flu-like symptoms, shortness of breath, cough etc.) before then, please call [phone].  If you test positive for Covid 19 in the 2 weeks post procedure, please call and report this information to us.    If any biopsies were taken you will be contacted by phone or by letter within the next 1-3 weeks.  Please call us at [phone] if you have not heard about the biopsies in 3 weeks.    SIGNATURES/CONFIDENTIALITY: You and/or your care partner have signed paperwork which will be entered into your electronic medical record.  These signatures attest to the fact that that the information above on   your After Visit Summary has been reviewed and is understood.  Full responsibility of the confidentiality of this discharge information lies with you and/or your care-partner. 

## 2022-02-06 NOTE — Op Note (Signed)
[place] ?Patient Name: Sherry Wong ?Procedure Date: 02/06/2022 11:22 AM ?MRN: [id number] ?Endoscopist: Mauri Pole , MD ?Age: 66 ?Referring MD:  ?Date of Birth: 1956/06/06 ?Gender: Female ?Account #: [id number] ?Procedure:                Colonoscopy ?Indications:              Obtain more precise diagnosis of inflammatory bowel  ?                          disease, Exclude progression to ulcerative colitis.  ?                          Clinically significant diarrhea of unexplained  ?                          origin, h/o lymphocytic colitis ?Medicines:                Monitored Anesthesia Care ?Procedure:                Pre-Anesthesia Assessment: ?                          - Prior to the procedure, a History and Physical  ?                          was performed, and patient medications and  ?                          allergies were reviewed. The patient's tolerance of  ?                          previous anesthesia was also reviewed. The risks  ?                          and benefits of the procedure and the sedation  ?                          options and risks were discussed with the patient.  ?                          All questions were answered, and informed consent  ?                          was obtained. Prior Anticoagulants: The patient has  ?                          taken no previous anticoagulant or antiplatelet  ?                          agents. ASA Grade Assessment: II - A patient with  ?                          mild systemic disease. After reviewing the risks  ?  and benefits, the patient was deemed in  ?                          satisfactory condition to undergo the procedure. ?                          After obtaining informed consent, the colonoscope  ?                          was passed under direct vision. Throughout the  ?                          procedure, the patient's blood pressure, pulse, and  ?                          oxygen saturations were  monitored continuously. The  ?                          PCF-HQ190L Colonoscope was introduced through the  ?                          anus and advanced to the the cecum, identified by  ?                          appendiceal orifice and ileocecal valve. The  ?                          colonoscopy was performed without difficulty. The  ?                          patient tolerated the procedure well. The quality  ?                          of the bowel preparation was adequate. The  ?                          ileocecal valve, appendiceal orifice, and rectum  ?                          were photographed. ?Scope In: 11:29:45 AM ?Scope Out: 11:48:17 AM ?Scope Withdrawal Time: 0 hours 12 minutes 9 seconds  ?Total Procedure Duration: 0 hours 18 minutes 32 seconds  ?Findings:                 The perianal and digital rectal examinations were  ?                          normal. ?                          Normal mucosa was found in the entire colon.  ?                          Biopsies for histology were taken with a cold  ?  forceps from the right colon and left colon for  ?                          evaluation of microscopic colitis. ?                          Scattered small-mouthed diverticula were found in  ?                          the sigmoid colon. ?                          Non-bleeding external and internal hemorrhoids were  ?                          found during retroflexion. The hemorrhoids were  ?                          medium-sized. ?                          The exam was otherwise without abnormality. ?Complications:            No immediate complications. ?Estimated Blood Loss:     Estimated blood loss was minimal. ?Impression:               - Normal mucosa in the entire examined colon.  ?                          Biopsied. ?                          - Diverticulosis in the sigmoid colon. ?                          - Non-bleeding external and internal hemorrhoids. ?                           - The examination was otherwise normal. ?Recommendation:           - Patient has a contact number available for  ?                          emergencies. The signs and symptoms of potential  ?                          delayed complications were discussed with the  ?                          patient. Return to normal activities tomorrow.  ?                          Written discharge instructions were provided to the  ?                          patient. ?                          -  Resume previous diet. ?                          - Continue present medications. ?                          - Await pathology results. ?                          - Repeat colonoscopy in 10 years for surveillance. ?                          - Return to GI clinic at the next available  ?                          appointment in 2-3 months. ?Mauri Pole, MD ?02/06/2022 11:52:53 AM ?This report has been signed electronically. ?

## 2022-02-06 NOTE — Progress Notes (Signed)
Please refer to office visit note 01/30/22. No additional changes in H&P ?Patient is appropriate for planned procedure(s) and anesthesia in an ambulatory setting ? ?K. Denzil Magnuson , MD ?[phone]   ?

## 2022-02-06 NOTE — Progress Notes (Signed)
VS completed by AS.  Pt's states no medical or surgical changes since previsit or office visit.  

## 2022-02-06 NOTE — Progress Notes (Signed)
Report to PACU, RN, vss, BBS= Clear.  

## 2022-02-08 ENCOUNTER — Telehealth: Payer: Self-pay | Admitting: *Deleted

## 2022-02-08 ENCOUNTER — Telehealth: Payer: Self-pay

## 2022-02-08 NOTE — Telephone Encounter (Signed)
First attempt, left VM.  

## 2022-02-08 NOTE — Telephone Encounter (Signed)
?  Follow up Call- ? ? ?  02/06/2022  ? 10:35 AM 12/20/2019  ?  2:30 PM  ?Call back number  ?Post procedure Call Back phone  # [phone]  ?Permission to leave phone message Yes Yes  ?  ? ?Patient questions: ? ?Do you have a fever, pain , or abdominal swelling? No. ?Pain Score  0 * ? ?Have you tolerated food without any problems? Yes.   ? ?Have you been able to return to your normal activities? Yes.   ? ?Do you have any questions about your discharge instructions: ?Diet   No. ?Medications  No. ?Follow up visit  No. ? ?Do you have questions or concerns about your Care? No. ? ?Actions: ?* If pain score is 4 or above: ?No action needed, pain <4. ? ? ?

## 2022-02-11 ENCOUNTER — Other Ambulatory Visit: Payer: Self-pay

## 2022-02-11 MED ORDER — BUDESONIDE 3 MG PO CPEP: ORAL_CAPSULE | ORAL | 2 refills | Status: DC

## 2022-03-12 ENCOUNTER — Encounter: Payer: Medicare Other | Attending: Physical Medicine and Rehabilitation | Admitting: Registered Nurse

## 2022-03-12 ENCOUNTER — Encounter: Payer: Self-pay | Admitting: Registered Nurse

## 2022-03-12 VITALS — BP 125/85 | HR 78 | Ht 64.0 in | Wt 231.0 lb

## 2022-03-12 DIAGNOSIS — F3289 Other specified depressive episodes: Secondary | ICD-10-CM | POA: Insufficient documentation

## 2022-03-12 DIAGNOSIS — G894 Chronic pain syndrome: Secondary | ICD-10-CM | POA: Diagnosis not present

## 2022-03-12 DIAGNOSIS — M7061 Trochanteric bursitis, right hip: Secondary | ICD-10-CM | POA: Diagnosis not present

## 2022-03-12 DIAGNOSIS — M5136 Other intervertebral disc degeneration, lumbar region: Secondary | ICD-10-CM | POA: Insufficient documentation

## 2022-03-12 DIAGNOSIS — M5416 Radiculopathy, lumbar region: Secondary | ICD-10-CM | POA: Diagnosis not present

## 2022-03-12 DIAGNOSIS — Z5181 Encounter for therapeutic drug level monitoring: Secondary | ICD-10-CM | POA: Insufficient documentation

## 2022-03-12 DIAGNOSIS — F411 Generalized anxiety disorder: Secondary | ICD-10-CM | POA: Diagnosis present

## 2022-03-12 DIAGNOSIS — M7062 Trochanteric bursitis, left hip: Secondary | ICD-10-CM | POA: Insufficient documentation

## 2022-03-12 DIAGNOSIS — Z79891 Long term (current) use of opiate analgesic: Secondary | ICD-10-CM | POA: Diagnosis not present

## 2022-03-12 DIAGNOSIS — G834 Cauda equina syndrome: Secondary | ICD-10-CM | POA: Insufficient documentation

## 2022-03-12 MED ORDER — OXYCODONE-ACETAMINOPHEN 10-325 MG PO TABS: 1.0000 | ORAL_TABLET | Freq: Four times a day (QID) | ORAL | 0 refills | Status: DC | PRN

## 2022-03-12 MED ORDER — MORPHINE SULFATE ER 30 MG PO TBCR: EXTENDED_RELEASE_TABLET | ORAL | 0 refills | Status: DC

## 2022-03-12 MED ORDER — ALPRAZOLAM 0.25 MG PO TABS: ORAL_TABLET | ORAL | 2 refills | Status: DC

## 2022-03-12 NOTE — Progress Notes (Signed)
Subjective:    Patient ID: Sherry Wong, female    DOB: 27-Jan-1956, 66 y.o.   MRN: [id number]  HPI: Sherry Wong is a 66 y.o. female who returns for follow up appointment for chronic pain and medication refill. She states her pain is located in  her lower back radiating into her bilateral hips and bilateral lower extremities. She also reports pain into her bilateral feet. She rates her pain 5. Her current exercise regime is walking and performing stretching exercises.  Sherry Wong Morphine equivalent is 120.00 MME.  She is also prescribed Alprazolam .We have discussed the black box warning of using opioids and benzodiazepines. I highlighted the dangers of using these drugs together and discussed the adverse events including respiratory suppression, overdose, cognitive impairment and importance of compliance with current regimen. We will continue to monitor and adjust as indicated.   UDS  ordered today.     Pain Inventory Average Pain 7 Pain Right Now 5 My pain is sharp, burning, dull, stabbing, tingling, and aching  In the last 24 hours, has pain interfered with the following? General activity 9 Relation with others 9 Enjoyment of life 10 What TIME of day is your pain at its worst? morning , daytime, evening, and night Sleep (in general) Fair  Pain is worse with: walking, bending, sitting, standing, and some activites Pain improves with: rest, heat/ice, therapy/exercise, pacing activities, and medication Relief from Meds: 5  Family History  Problem Relation Age of Onset   Anesthesia problems Mother    COPD Mother    Diabetes Mother    Anesthesia problems Sister    Hypotension Neg Hx    Malignant hyperthermia Neg Hx    Pseudochol deficiency Neg Hx    Colon cancer Neg Hx    Esophageal cancer Neg Hx    Liver disease Neg Hx    Pancreatic cancer Neg Hx    Social History   Socioeconomic History   Marital status: Married    Spouse name: Not on file   Number of children:  0   Years of education: 12   Highest education level: Not on file  Occupational History   Occupation: Disabled  Tobacco Use   Smoking status: Never   Smokeless tobacco: Never  Vaping Use   Vaping Use: Never used  Substance and Sexual Activity   Alcohol use: Yes    Alcohol/week: 0.0 standard drinks of alcohol    Comment: occ- 0.5 can beer every 2 months   Drug use: No   Sexual activity: Not Currently  Other Topics Concern   Not on file  Social History Narrative   Lives at home with husband.   Caffeine use: 1 cup coffee 3 times per week.    Social Determinants of Health   Financial Resource Strain: Not on file  Food Insecurity: Not on file  Transportation Needs: Not on file  Physical Activity: Not on file  Stress: Not on file  Social Connections: Not on file   Past Surgical History:  Procedure Laterality Date   BREAST BIOPSY     right   BREAST LUMPECTOMY Left 2014   CHOLECYSTECTOMY     "gangrene in small intestines clipped by surgeon" per patient   COLONOSCOPY  2009   Dr Rowe Pavy    cysts removed from ovary     [place] Left    ESOPHAGOGASTRODUODENOSCOPY     HEMATOMA EVACUATION N/A 08/25/2013   Procedure:  EVACUATION OF LUMBAR HEMATOMA;  Surgeon: Floyce Stakes, MD;  Location: [facility];  Service: Neurosurgery;  Laterality: N/A;   LUMBAR LAMINECTOMY/DECOMPRESSION MICRODISCECTOMY  12/03/2011   Procedure: LUMBAR LAMINECTOMY/DECOMPRESSION MICRODISCECTOMY 2 LEVELS;  Surgeon: Floyce Stakes, MD;  Location: [place] [facility];  Service: Neurosurgery;  Laterality: Right;  Right Lumbar four-five Lumbar five sacral one Foraminotomies   LUMBAR WOUND DEBRIDEMENT N/A 09/01/2013   Procedure: LUMBAR WOUND DEBRIDEMENT;  Surgeon: Floyce Stakes, MD;  Location: [facility];  Service: Neurosurgery;  Laterality: N/A;   PLACEMENT OF LUMBAR DRAIN N/A 09/01/2013   Procedure: PLACEMENT OF LUMBAR DRAIN;  Surgeon: Floyce Stakes, MD;  Location: [facility];  Service: Neurosurgery;  Laterality: N/A;   TRANSFORAMINAL LUMBAR INTERBODY FUSION (TLIF) WITH PEDICLE SCREW FIXATION 2 LEVEL N/A 08/25/2013   Procedure:  Lumbar four-five, Lumbar five-Sacral one Transforaminal Lumbar Interbody Fusion ;  Surgeon: Floyce Stakes, MD;  Location: [place] [facility];  Service: Neurosurgery;  Laterality: N/A;   Past Surgical History:  Procedure Laterality Date   BREAST BIOPSY     right   BREAST LUMPECTOMY Left 2014   CHOLECYSTECTOMY     "gangrene in small intestines clipped by surgeon" per patient   COLONOSCOPY  2009   Dr Rowe Pavy    cysts removed from ovary     DILATION AND CURETTAGE OF UTERUS     ELBOW SURGERY Left    ESOPHAGOGASTRODUODENOSCOPY     HEMATOMA EVACUATION N/A 08/25/2013   Procedure: EVACUATION OF LUMBAR HEMATOMA;  Surgeon: Floyce Stakes, MD;  Location: [facility];  Service: Neurosurgery;  Laterality: N/A;   LUMBAR LAMINECTOMY/DECOMPRESSION MICRODISCECTOMY  12/03/2011   Procedure: LUMBAR LAMINECTOMY/DECOMPRESSION MICRODISCECTOMY 2 LEVELS;  Surgeon: Floyce Stakes, MD;  Location: [place] [facility];  Service: Neurosurgery;  Laterality: Right;  Right Lumbar four-five Lumbar five sacral one Foraminotomies   LUMBAR WOUND DEBRIDEMENT N/A 09/01/2013   Procedure: LUMBAR WOUND DEBRIDEMENT;  Surgeon: Floyce Stakes, MD;  Location: [facility];  Service: Neurosurgery;  Laterality: N/A;   PLACEMENT OF LUMBAR DRAIN N/A 09/01/2013   Procedure: PLACEMENT OF LUMBAR DRAIN;  Surgeon: Floyce Stakes, MD;  Location: [facility];  Service: Neurosurgery;  Laterality: N/A;   TRANSFORAMINAL LUMBAR INTERBODY FUSION (TLIF) WITH PEDICLE SCREW FIXATION 2 LEVEL N/A 08/25/2013   Procedure:  Lumbar four-five, Lumbar five-Sacral one Transforaminal Lumbar Interbody Fusion ;  Surgeon: Floyce Stakes, MD;  Location: [place] [facility];  Service: Neurosurgery;  Laterality: N/A;   Past Medical History:  Diagnosis Date   Anxiety    takes celexa daily   Arthritis    Asthma     Bronchitis    Chronic back pain    ddd/lumbago,and radiculopathy   COVID-17 Sep 2020   Depression    Gastric ulcer    GERD (gastroesophageal reflux disease)    takes Protonix daily   Gout    hx of   H/O hiatal hernia    Hx of colonic polyps    Hypertension    takes Losartan daily   Hypothyroidism    takes Levothyroxine daily   IBS (irritable bowel syndrome)    Nocturia    Pneumonia    hx of 15+yrs ago   PONV (postoperative nausea and vomiting)    Ulcerative (chronic) enterocolitis ([place]) 07/2020   Dr. Juliann Mule   Ulcerative colitis    Urinary incontinence    BP 125/85   Pulse 78   Ht '5\' 4"'$  (1.626  m)   Wt 231 lb (104.8 kg)   SpO2 98%   BMI 39.65 kg/m   Opioid Risk Score:   Fall Risk Score:  `1  Depression screen [facility] 2/9     03/12/2022   11:29 AM 01/14/2022   11:08 AM 11/19/2021   12:50 PM 09/18/2021    2:58 PM 07/19/2021    2:15 PM 05/24/2021    1:08 PM 03/22/2021   10:12 AM  Depression screen PHQ 2/9  Decreased Interest '2 3 3 1 '$ 0 0 1  Down, Depressed, Hopeless '2 3 3 1 '$ 0 0 1  PHQ - 2 Score '4 6 6 2 '$ 0 0 2     Review of Systems  Musculoskeletal:  Positive for back pain.       Bilateral leg pain  All other systems reviewed and are negative.     Objective:   Physical Exam Vitals and nursing note reviewed.  Constitutional:      Appearance: Normal appearance.  Cardiovascular:     Rate and Rhythm: Normal rate and regular rhythm.     Pulses: Normal pulses.     Heart sounds: Normal heart sounds.  Pulmonary:     Effort: Pulmonary effort is normal. No respiratory distress.     Breath sounds: Normal breath sounds.  Musculoskeletal:     Cervical back: Normal range of motion and neck supple.     Comments: Normal Muscle Bulk and Muscle Testing Reveals:  Upper Extremities: Full ROM and Muscle Strength 5/5 Lumbar Paraspinal Tenderness: L-3-L-5 Bilateral Greater Trochanter Tenderness Lower Extremities:Full ROM and Muscle  Strength 5/5 Arises from Table Slowly  using cane for support Antalgic Gait     Skin:    General: Skin is warm and dry.  Neurological:     Mental Status: She is alert and oriented to person, place, and time.  Psychiatric:        Mood and Affect: Mood normal.        Behavior: Behavior normal.         Assessment & Plan:  1. Cauda equina syndrome: Incontinence: Wearing Depends:Continue to Monitor. 03/12/2022. 2. Insomnia: Continue with Nortriptyline. 03/12/2022 3. 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 Continue current medication regimen. 03/12/2022 Refilled:  Percocet 10/325 mg #120--use 1 tablet every 6 hours as needed and  MS Contin 30 mg one tablet every 12 hours as needed #60. Second scripts sent for the following month We will continue the opioid monitoring program, this consists of regular clinic visits, examinations, urine drug screen, pill counts as well as use of [place] Controlled Substance Reporting system. A 12 month History has been reviewed on the [place] Controlled Substance Reporting System on 03/12/2022. 4. Depression: Continue with Cymbalta. Continue to Monitor. 03/12/2022 5. Anxiety: Continue with current medication regimen with Xanax. Continue to Monitor. 03/12/2022 6.Neuropathy:Continue:with current medication nregimen with  Pamelor. 03/12/2022 7. Constipation: Continue Senokot. Continue to Monitor. 03/12/2022 8.Muscle Spasm: Continue Baclofen.Continue to Monitor. 03/12/2022 9. Lumbar Radiculitis: Continue Pamelor. Continue to Monitor. 03/12/2022 10. Bilateral Feet Neuropathic Pain with tingling; . Continue current medication regimen. Continue to monitor. 06/13/20223 11. Bilateral Greater Trochanter Bursitis: Continue to Alternate Ice and Heat Therapy . Continue HEP as Tolerated. Continue to Monitor. 03/12/2022   F/U in 2 months

## 2022-03-16 LAB — TOXASSURE SELECT,+ANTIDEPR,UR

## 2022-03-18 ENCOUNTER — Telehealth: Payer: Self-pay | Admitting: *Deleted

## 2022-03-18 NOTE — Telephone Encounter (Signed)
Urine drug screen for this encounter is consistent for prescribed medication 

## 2022-04-17 ENCOUNTER — Ambulatory Visit: Payer: Medicare Other | Admitting: Gastroenterology

## 2022-04-18 DIAGNOSIS — R5383 Other fatigue: Secondary | ICD-10-CM | POA: Diagnosis not present

## 2022-04-18 DIAGNOSIS — E039 Hypothyroidism, unspecified: Secondary | ICD-10-CM | POA: Diagnosis not present

## 2022-04-18 DIAGNOSIS — K219 Gastro-esophageal reflux disease without esophagitis: Secondary | ICD-10-CM | POA: Diagnosis not present

## 2022-04-18 DIAGNOSIS — I10 Essential (primary) hypertension: Secondary | ICD-10-CM | POA: Diagnosis not present

## 2022-04-18 DIAGNOSIS — E876 Hypokalemia: Secondary | ICD-10-CM | POA: Diagnosis not present

## 2022-04-18 DIAGNOSIS — E7849 Other hyperlipidemia: Secondary | ICD-10-CM | POA: Diagnosis not present

## 2022-04-22 DIAGNOSIS — R030 Elevated blood-pressure reading, without diagnosis of hypertension: Secondary | ICD-10-CM | POA: Diagnosis not present

## 2022-04-22 DIAGNOSIS — E039 Hypothyroidism, unspecified: Secondary | ICD-10-CM | POA: Diagnosis not present

## 2022-04-22 DIAGNOSIS — E7849 Other hyperlipidemia: Secondary | ICD-10-CM | POA: Diagnosis not present

## 2022-04-22 DIAGNOSIS — K219 Gastro-esophageal reflux disease without esophagitis: Secondary | ICD-10-CM | POA: Diagnosis not present

## 2022-04-22 DIAGNOSIS — R609 Edema, unspecified: Secondary | ICD-10-CM | POA: Diagnosis not present

## 2022-04-22 DIAGNOSIS — K580 Irritable bowel syndrome with diarrhea: Secondary | ICD-10-CM | POA: Diagnosis not present

## 2022-05-13 ENCOUNTER — Encounter: Payer: Medicare Other | Attending: Physical Medicine and Rehabilitation | Admitting: Registered Nurse

## 2022-05-13 ENCOUNTER — Encounter: Payer: Self-pay | Admitting: Registered Nurse

## 2022-05-13 VITALS — BP 143/85 | HR 77 | Ht 64.0 in | Wt 231.8 lb

## 2022-05-13 DIAGNOSIS — F411 Generalized anxiety disorder: Secondary | ICD-10-CM | POA: Diagnosis present

## 2022-05-13 DIAGNOSIS — G834 Cauda equina syndrome: Secondary | ICD-10-CM | POA: Diagnosis not present

## 2022-05-13 DIAGNOSIS — F3289 Other specified depressive episodes: Secondary | ICD-10-CM | POA: Diagnosis present

## 2022-05-13 DIAGNOSIS — M5136 Other intervertebral disc degeneration, lumbar region: Secondary | ICD-10-CM | POA: Insufficient documentation

## 2022-05-13 DIAGNOSIS — M7062 Trochanteric bursitis, left hip: Secondary | ICD-10-CM | POA: Diagnosis not present

## 2022-05-13 DIAGNOSIS — M7061 Trochanteric bursitis, right hip: Secondary | ICD-10-CM | POA: Diagnosis not present

## 2022-05-13 DIAGNOSIS — Z79891 Long term (current) use of opiate analgesic: Secondary | ICD-10-CM | POA: Insufficient documentation

## 2022-05-13 DIAGNOSIS — G894 Chronic pain syndrome: Secondary | ICD-10-CM | POA: Diagnosis not present

## 2022-05-13 DIAGNOSIS — Z5181 Encounter for therapeutic drug level monitoring: Secondary | ICD-10-CM | POA: Diagnosis not present

## 2022-05-13 DIAGNOSIS — M5416 Radiculopathy, lumbar region: Secondary | ICD-10-CM | POA: Insufficient documentation

## 2022-05-13 IMAGING — DX DG ABDOMEN 1V
2 series · 2 of 2 positions shown · non-contrast
Comparison: None

CLINICAL DATA: Diarrhea, nausea, vomiting

EXAM:
ABDOMEN - 1 VIEW

[abdomen kub (1 of 2)]
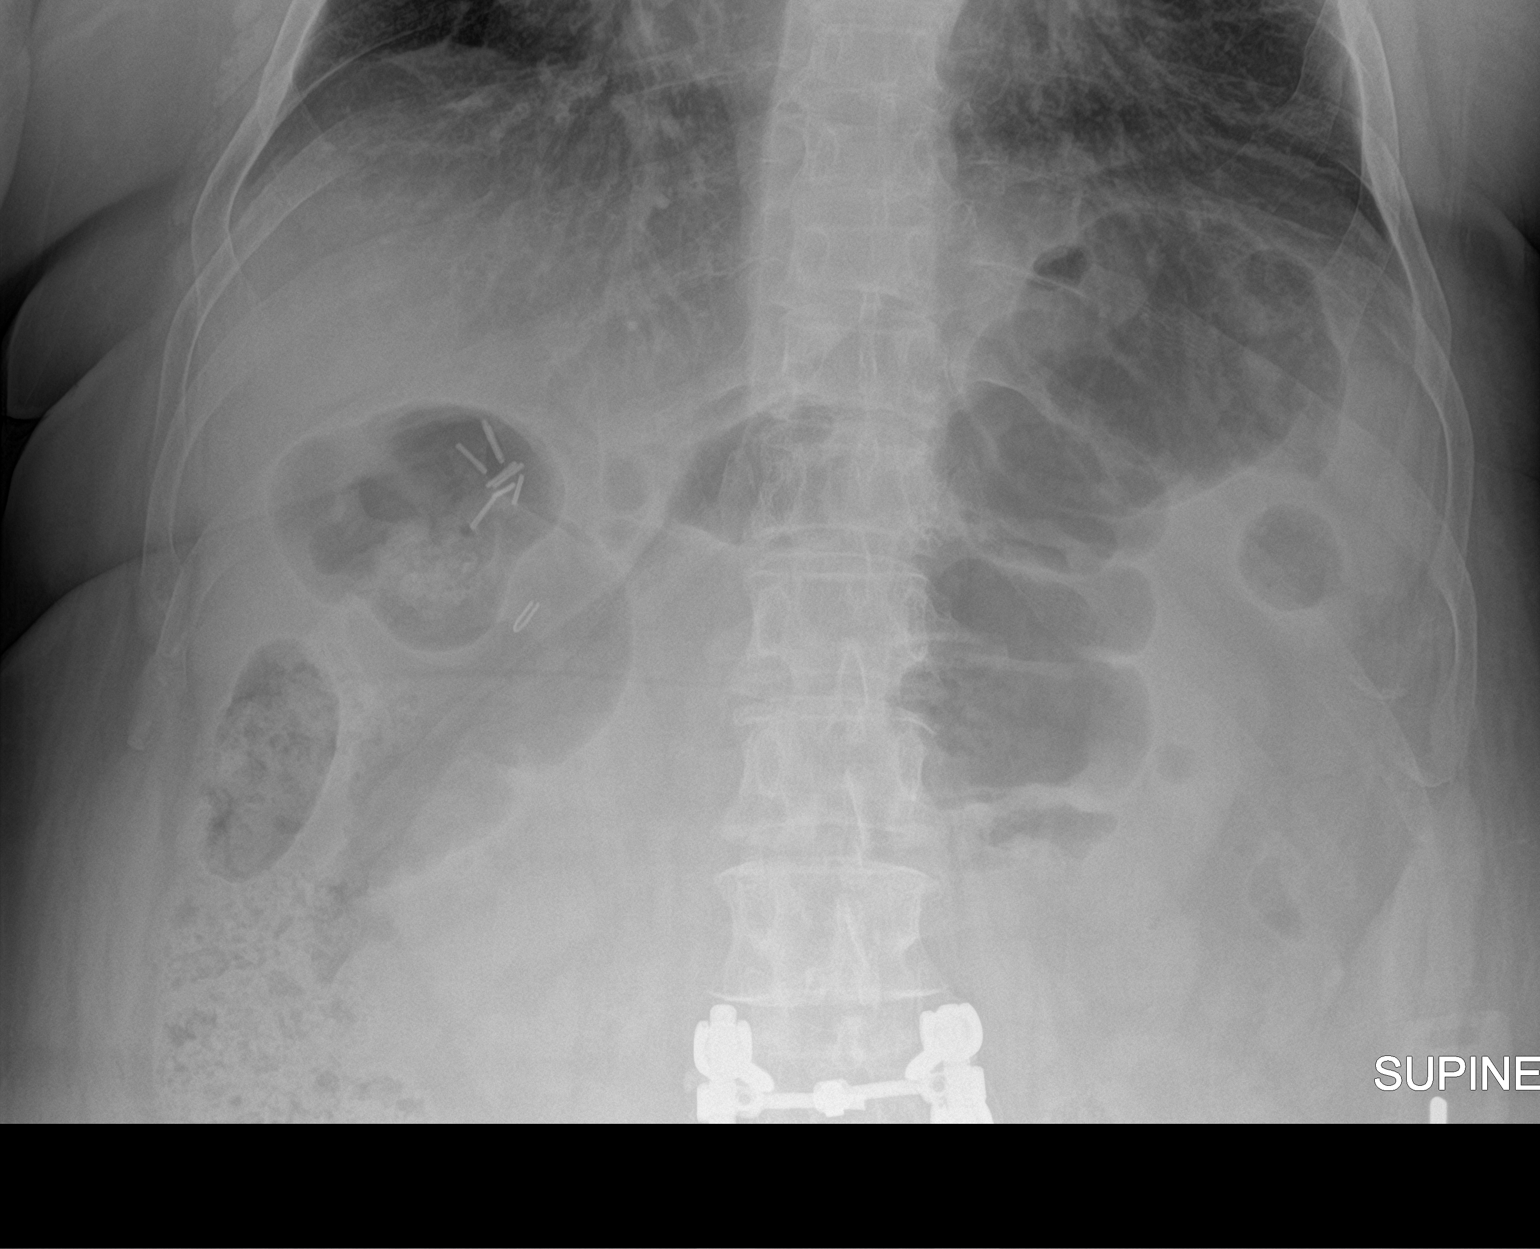

[abdomen kub (2 of 2)]
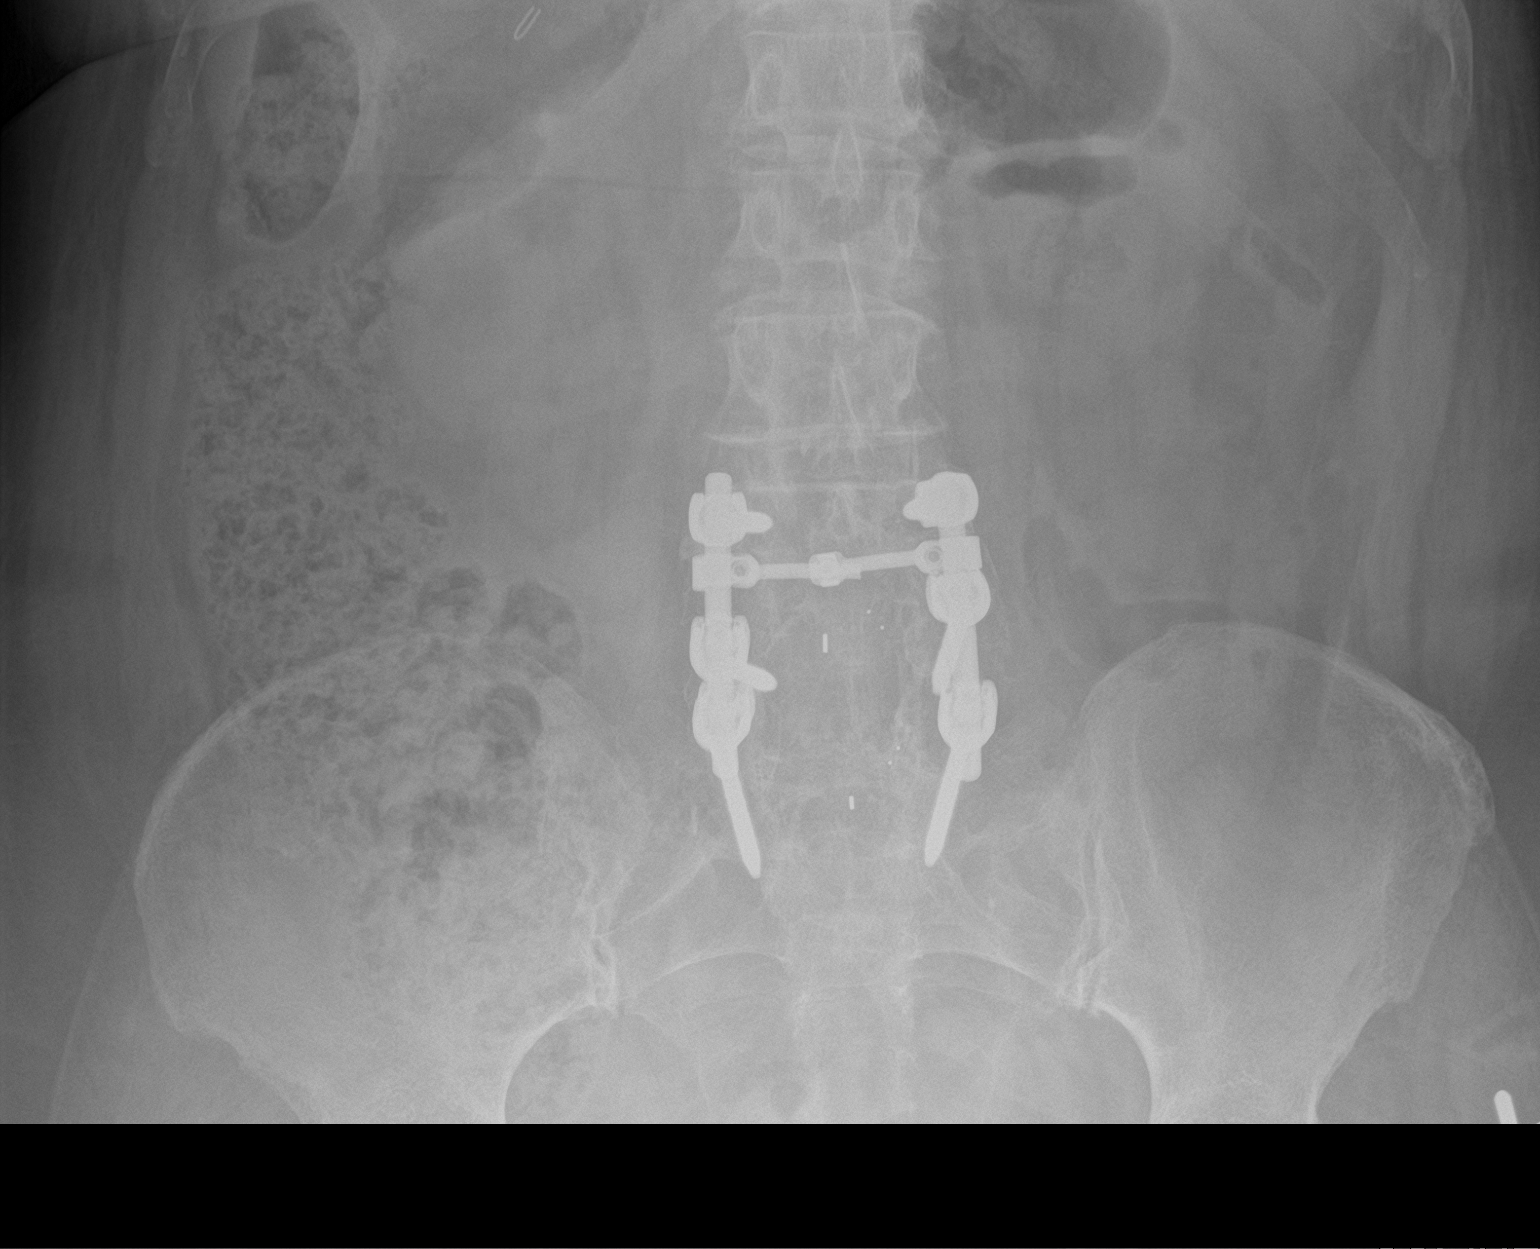

[2 of 2 positions shown; findings below may reference images not displayed]

FINDINGS: Nonobstructive bowel gas pattern. No free intraperitoneal gas. Small
to moderate stool within the ascending colon. The distal colon is
relatively devoid stool. Inferior pelvis excluded from view. No
organomegaly. Surgical clips noted within the right upper quadrant
likely related to prior cholecystectomy. L4-S1 lumbar fusion and
posterior decompression has been performed.
IMPRESSION: Nonobstructive bowel gas pattern. Small to moderate stool burden
within the proximal colon.

## 2022-05-13 MED ORDER — NORTRIPTYLINE HCL 50 MG PO CAPS: ORAL_CAPSULE | ORAL | 5 refills | Status: DC

## 2022-05-13 MED ORDER — OXYCODONE-ACETAMINOPHEN 10-325 MG PO TABS: 1.0000 | ORAL_TABLET | Freq: Four times a day (QID) | ORAL | 0 refills | Status: DC | PRN

## 2022-05-13 MED ORDER — MORPHINE SULFATE ER 30 MG PO TBCR: EXTENDED_RELEASE_TABLET | ORAL | 0 refills | Status: DC

## 2022-05-13 MED ORDER — ALPRAZOLAM 0.25 MG PO TABS: ORAL_TABLET | ORAL | 2 refills | Status: DC

## 2022-05-13 NOTE — Progress Notes (Signed)
Subjective:    Patient ID: Sherry Wong, female    DOB: [birth year], 66 y.o.   MRN: [id number]  HPI: Sherry Wong is a 66 y.o. female who returns for follow up appointment for chronic pain and medication refill. She states her  pain is located in her lower back radiating into her bilateral hips and bilateral lower extremities. She also reports bilateral feet pain with tingling and burning. She rates her pain 5. Her current exercise regime is walking and performing stretching exercises.  Ms. Vales Morphine equivalent is 120.00 MME. She is also prescribed Alprazolam.  .We have discussed the black box warning of using opioids and benzodiazepines. I highlighted the dangers of using these drugs together and discussed the adverse events including respiratory suppression, overdose, cognitive impairment and importance of compliance with current regimen. We will continue to monitor and adjust as indicated.    Last UDS was Performed on 03/12/2022, it was consistent.    Pain Inventory Average Pain 7 Pain Right Now 5 My pain is sharp, burning, dull, stabbing, tingling, and aching  In the last 24 hours, has pain interfered with the following? General activity 9 Relation with others 9 Enjoyment of life 10 What TIME of day is your pain at its worst? morning , daytime, evening, and night Sleep (in general) Fair  Pain is worse with: walking, bending, sitting, standing, and some activites Pain improves with: rest, heat/ice, pacing activities, and medication Relief from Meds: 4  Family History  Problem Relation Age of Onset   Anesthesia problems Mother    COPD Mother    Diabetes Mother    Anesthesia problems Sister    Hypotension Neg Hx    Malignant hyperthermia Neg Hx    Pseudochol deficiency Neg Hx    Colon cancer Neg Hx    Esophageal cancer Neg Hx    Liver disease Neg Hx    Pancreatic cancer Neg Hx    Social History   Socioeconomic History   Marital status: Married    Spouse  name: Not on file   Number of children: 0   Years of education: 12   Highest education level: Not on file  Occupational History   Occupation: Disabled  Tobacco Use   Smoking status: Never   Smokeless tobacco: Never  Vaping Use   Vaping Use: Never used  Substance and Sexual Activity   Alcohol use: Yes    Alcohol/week: 0.0 standard drinks of alcohol    Comment: occ- 0.5 can beer every 2 months   Drug use: No   Sexual activity: Not Currently  Other Topics Concern   Not on file  Social History Narrative   Lives at home with husband.   Caffeine use: 1 cup coffee 3 times per week.    Social Determinants of Health   Financial Resource Strain: Not on file  Food Insecurity: Not on file  Transportation Needs: Not on file  Physical Activity: Not on file  Stress: Not on file  Social Connections: Not on file   Past Surgical History:  Procedure Laterality Date   BREAST BIOPSY     right   BREAST LUMPECTOMY Left 2014   CHOLECYSTECTOMY     "gangrene in small intestines clipped by surgeon" per patient   COLONOSCOPY  2009   Dr Rowe Pavy    cysts removed from ovary     [place] Left    ESOPHAGOGASTRODUODENOSCOPY     HEMATOMA  EVACUATION N/A 08/25/2013   Procedure: EVACUATION OF LUMBAR HEMATOMA;  Surgeon: Floyce Stakes, MD;  Location: [facility];  Service: Neurosurgery;  Laterality: N/A;   LUMBAR LAMINECTOMY/DECOMPRESSION MICRODISCECTOMY  12/03/2011   Procedure: LUMBAR LAMINECTOMY/DECOMPRESSION MICRODISCECTOMY 2 LEVELS;  Surgeon: Floyce Stakes, MD;  Location: [place] [facility];  Service: Neurosurgery;  Laterality: Right;  Right Lumbar four-five Lumbar five sacral one Foraminotomies   LUMBAR WOUND DEBRIDEMENT N/A 09/01/2013   Procedure: LUMBAR WOUND DEBRIDEMENT;  Surgeon: Floyce Stakes, MD;  Location: [facility];  Service: Neurosurgery;  Laterality: N/A;   PLACEMENT OF LUMBAR DRAIN N/A 09/01/2013   Procedure: PLACEMENT OF LUMBAR DRAIN;  Surgeon:  Floyce Stakes, MD;  Location: [facility];  Service: Neurosurgery;  Laterality: N/A;   TRANSFORAMINAL LUMBAR INTERBODY FUSION (TLIF) WITH PEDICLE SCREW FIXATION 2 LEVEL N/A 08/25/2013   Procedure:  Lumbar four-five, Lumbar five-Sacral one Transforaminal Lumbar Interbody Fusion ;  Surgeon: Floyce Stakes, MD;  Location: [place] [facility];  Service: Neurosurgery;  Laterality: N/A;   Past Surgical History:  Procedure Laterality Date   BREAST BIOPSY     right   BREAST LUMPECTOMY Left 2014   CHOLECYSTECTOMY     "gangrene in small intestines clipped by surgeon" per patient   COLONOSCOPY  2009   Dr Rowe Pavy    cysts removed from ovary     DILATION AND CURETTAGE OF UTERUS     ELBOW SURGERY Left    ESOPHAGOGASTRODUODENOSCOPY     HEMATOMA EVACUATION N/A 08/25/2013   Procedure: EVACUATION OF LUMBAR HEMATOMA;  Surgeon: Floyce Stakes, MD;  Location: [facility];  Service: Neurosurgery;  Laterality: N/A;   LUMBAR LAMINECTOMY/DECOMPRESSION MICRODISCECTOMY  12/03/2011   Procedure: LUMBAR LAMINECTOMY/DECOMPRESSION MICRODISCECTOMY 2 LEVELS;  Surgeon: Floyce Stakes, MD;  Location: [place] [facility];  Service: Neurosurgery;  Laterality: Right;  Right Lumbar four-five Lumbar five sacral one Foraminotomies   LUMBAR WOUND DEBRIDEMENT N/A 09/01/2013   Procedure: LUMBAR WOUND DEBRIDEMENT;  Surgeon: Floyce Stakes, MD;  Location: [facility];  Service: Neurosurgery;  Laterality: N/A;   PLACEMENT OF LUMBAR DRAIN N/A 09/01/2013   Procedure: PLACEMENT OF LUMBAR DRAIN;  Surgeon: Floyce Stakes, MD;  Location: [facility];  Service: Neurosurgery;  Laterality: N/A;   TRANSFORAMINAL LUMBAR INTERBODY FUSION (TLIF) WITH PEDICLE SCREW FIXATION 2 LEVEL N/A 08/25/2013   Procedure:  Lumbar four-five, Lumbar five-Sacral one Transforaminal Lumbar Interbody Fusion ;  Surgeon: Floyce Stakes, MD;  Location: [place] [facility];  Service: Neurosurgery;  Laterality: N/A;   Past Medical History:  Diagnosis Date   Anxiety    takes  celexa daily   Arthritis    Asthma    Bronchitis    Chronic back pain    ddd/lumbago,and radiculopathy   COVID-17 Sep 2020   Depression    Gastric ulcer    GERD (gastroesophageal reflux disease)    takes Protonix daily   Gout    hx of   H/O hiatal hernia    Hx of colonic polyps    Hypertension    takes Losartan daily   Hypothyroidism    takes Levothyroxine daily   IBS (irritable bowel syndrome)    Nocturia    Pneumonia    hx of 15+yrs ago   PONV (postoperative nausea and vomiting)    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 Juliann Mule   Ulcerative colitis    Urinary incontinence    BP (!) 143/85   Pulse  77   Ht 5' 4"  (1.626 m)   Wt 231 lb 12.8 oz (105.1 kg)   SpO2 96%   BMI 39.79 kg/m   Opioid Risk Score:   Fall Risk Score:  `1  Depression screen [place] 2/9     05/13/2022   10:56 AM 03/12/2022   11:29 AM 01/14/2022   11:08 AM 11/19/2021   12:50 PM 09/18/2021    2:58 PM 07/19/2021    2:15 PM 05/24/2021    1:08 PM  Depression screen PHQ 2/9  Decreased Interest 0 2 3 3 1  0 0  Down, Depressed, Hopeless 0 2 3 3 1  0 0  PHQ - 2 Score 0 4 6 6 2  0 0      Review of Systems  Musculoskeletal:  Positive for back pain.       Bilateral leg pain      Objective:   Physical Exam Vitals and nursing note reviewed.  Constitutional:      Appearance: Normal appearance.  Cardiovascular:     Rate and Rhythm: Normal rate and regular rhythm.     Pulses: Normal pulses.     Heart sounds: Normal heart sounds.  Pulmonary:     Effort: Pulmonary effort is normal.     Breath sounds: Normal breath sounds.  Musculoskeletal:     Cervical back: Normal range of motion and neck supple.     Comments: Normal Muscle Bulk and Muscle Testing Reveals: Upper Extremities: Full ROM and Muscle Strength 5/5 Lumbar Hypersensitivity Bilateral Greater Trochanter Tenderness Lower Extremities: Full ROM and Muscle Strength 5/5 Arises from Chair slowly using cane for support Narrow  Based  Gait     Skin:    General: Skin is warm and dry.  Neurological:     Mental Status: She is alert and oriented to person, place, and time.  Psychiatric:        Mood and Affect: Mood normal.        Behavior: Behavior normal.         Assessment & Plan:  1. Cauda equina syndrome: Incontinence: Wearing Depends:Continue to Monitor. 05/13/2022. 2. Insomnia: Continue with Nortriptyline. 05/13/2022 3. 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 Continue current medication regimen. 05/13/2022 Refilled:  Percocet 10/325 mg #120--use 1 tablet every 6 hours as needed and  MS Contin 30 mg one tablet every 12 hours as needed #60. Second scripts sent for the following month We will continue the opioid monitoring program, this consists of regular clinic visits, examinations, urine drug screen, pill counts as well as use of [place] Controlled Substance Reporting system. A 12 month History has been reviewed on the [place] Controlled Substance Reporting System on 05/13/2022. 4. Depression: Continue with Cymbalta. Continue to Monitor. 05/13/2022 5. Anxiety: Continue with current medication regimen with Xanax. Continue to Monitor. 05/13/2022 6.Neuropathy:Continue:with current medication nregimen with  Pamelor. 05/13/2022 7. Constipation: Continue Senokot. Continue to Monitor. 05/13/2022 8.Muscle Spasm: Continue Baclofen.Continue to Monitor. 05/13/2022 9. Lumbar Radiculitis: Continue Pamelor. Continue to Monitor. 05/13/2022 10. Bilateral Feet Neuropathic Pain with tingling; . Continue current medication regimen. Continue to monitor. 08/14/20223 11. Bilateral Greater Trochanter Bursitis: Continue to Alternate Ice and Heat Therapy . Continue HEP as Tolerated. Continue to Monitor. 05/13/2022   F/U in 2 months

## 2022-06-17 ENCOUNTER — Other Ambulatory Visit: Payer: Self-pay | Admitting: Registered Nurse

## 2022-06-17 DIAGNOSIS — G834 Cauda equina syndrome: Secondary | ICD-10-CM

## 2022-06-17 DIAGNOSIS — M5136 Other intervertebral disc degeneration, lumbar region: Secondary | ICD-10-CM

## 2022-06-17 NOTE — Telephone Encounter (Signed)
Can you please send in the absence of Dr. Naaman Plummer and [name]?

## 2022-06-18 ENCOUNTER — Encounter: Payer: Self-pay | Admitting: Gastroenterology

## 2022-06-18 ENCOUNTER — Ambulatory Visit: Payer: Medicare Other | Admitting: Gastroenterology

## 2022-06-18 VITALS — BP 130/80 | HR 85 | Ht 64.0 in | Wt 233.0 lb

## 2022-06-18 DIAGNOSIS — T402X5A Adverse effect of other opioids, initial encounter: Secondary | ICD-10-CM

## 2022-06-18 DIAGNOSIS — K52832 Lymphocytic colitis: Secondary | ICD-10-CM

## 2022-06-18 DIAGNOSIS — K5903 Drug induced constipation: Secondary | ICD-10-CM | POA: Diagnosis not present

## 2022-06-18 DIAGNOSIS — K581 Irritable bowel syndrome with constipation: Secondary | ICD-10-CM

## 2022-06-18 MED ORDER — NALOXEGOL OXALATE 25 MG PO TABS: 25.0000 mg | ORAL_TABLET | Freq: Every day | ORAL | 3 refills | Status: DC

## 2022-06-18 NOTE — Progress Notes (Signed)
Sherry Wong    [id number]    Nov 25, 1955  Primary Care Physician:Skillman, Aundra Millet, PA-C  Referring Physician: Rosalee Kaufman, PA-C [address] [place],  [place] [address]   Chief complaint:  Lymphocytic colitis  HPI:  Sherry Wong is a very pleasant 66 year old here for follow-up visit for lymphocytic colitis.   She completed budesonide taper dose 3 weeks ago.  She she no longer has diarrhea, is actually experiencing worsening constipation with bowel movement on average once a week.  She is currently not taking any laxatives.  She is experiencing lower abdominal discomfort and fullness with excess flatulence She is using dicyclomine as needed with improvement of abdominal cramping   Denies any vomiting, melena or rectal bleeding.  Colonoscopy 02/06/22 - Normal mucosa in the entire examined colon. Biopsied.  Consistent with lymphocytic colitis - Diverticulosis in the sigmoid colon. - Non-bleeding external and internal hemorrhoids. - The examination was otherwise normal.   Relevant GI history: Colonoscopy December 20, 2019: Right and left colon biopsies consistent with lymphocytic colitis, she was treated with budesonide 9 mg daily for 37-monthcourse.   Developed recurrent diarrhea in November 2021 and was restarted on budesonide 9 mg daily for 3 months, with taper dose   C.diff colitis 08/30/20: Completed 2 weeks of vancomycin     Outpatient Encounter Medications as of 06/18/2022  Medication Sig   albuterol (PROVENTIL HFA;VENTOLIN HFA) 108 (90 BASE) MCG/ACT inhaler Inhale 2 puffs into the lungs 2 (two) times daily as needed.    ALPRAZolam (XANAX) 0.25 MG tablet TAKE 1 TABLET BY MOUTH TWICE DAILY AS NEEDED FOR ANXIETY.   Cholecalciferol (VITAMIN D3) 125 MCG (5000 UT) CAPS Take 2 capsules by mouth daily.   DULoxetine (CYMBALTA) 30 MG capsule Take by mouth daily. 2 capsules in the morning and 1 in the evenings   hyoscyamine (LEVSIN SL) 0.125 MG SL tablet  Place 1 tablet (0.125 mg total) under the tongue every 8 (eight) hours as needed.   levothyroxine (SYNTHROID) 137 MCG tablet Take 137 mcg by mouth daily.   morphine (MS CONTIN) 30 MG 12 hr tablet TAKE (1) TABLET EVERY TWELVE HOURS. Do Not Fill Before 06/18/2022   nortriptyline (PAMELOR) 50 MG capsule TAKE 1 TABLET BY MOUTH AT BEDTIME.   omeprazole (PRILOSEC) 40 MG capsule Take 1 capsule (40 mg total) by mouth daily.   ondansetron (ZOFRAN) 4 MG tablet Take 1 tablet (4 mg total) by mouth every 8 (eight) hours as needed for nausea or vomiting.   oxyCODONE-acetaminophen (PERCOCET) 10-325 MG tablet Take 1 tablet by mouth every 6 (six) hours as needed for pain. Do Not Fill Before 06/15/2022   simvastatin (ZOCOR) 20 MG tablet Take 20 mg by mouth daily.   vitamin B-12 (CYANOCOBALAMIN) 1000 MCG tablet Take 1,000 mcg by mouth daily.   budesonide (ENTOCORT EC) 3 MG 24 hr capsule 3 capsules daily x 60 days then 6 mg daily  x 30 days then 3 mg daily x 30 days (Patient not taking: Reported on 06/18/2022)   No facility-administered encounter medications on file as of 06/18/2022.    Allergies as of 06/18/2022 - Review Complete 06/18/2022  Allergen Reaction Noted   Codeine Nausea Only and Other (See Comments) 06/21/2011   Tegretol [carbamazepine] Other (See Comments) 07/24/2017    Past Medical History:  Diagnosis Date   Anxiety    takes celexa daily   Arthritis    Asthma    Bronchitis  Chronic back pain    ddd/lumbago,and radiculopathy   COVID-17 Sep 2020   Depression    Gastric ulcer    GERD (gastroesophageal reflux disease)    takes Protonix daily   Gout    hx of   H/O hiatal hernia    Hx of colonic polyps    Hypertension    takes Losartan daily   Hypothyroidism    takes Levothyroxine daily   IBS (irritable bowel syndrome)    Nocturia    Pneumonia    hx of 15+yrs ago   PONV (postoperative nausea and vomiting)    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 Juliann Mule    Ulcerative colitis    Urinary incontinence     Past Surgical History:  Procedure Laterality Date   BREAST BIOPSY     right   BREAST LUMPECTOMY Left 2014   CHOLECYSTECTOMY     "gangrene in small intestines clipped by surgeon" per patient   COLONOSCOPY  2009   Dr Rowe Pavy    cysts removed from ovary     DILATION AND CURETTAGE OF UTERUS     ELBOW SURGERY Left    ESOPHAGOGASTRODUODENOSCOPY     HEMATOMA EVACUATION N/A 08/25/2013   Procedure: EVACUATION OF LUMBAR HEMATOMA;  Surgeon: Floyce Stakes, MD;  Location: [facility];  Service: Neurosurgery;  Laterality: N/A;   LUMBAR LAMINECTOMY/DECOMPRESSION MICRODISCECTOMY  12/03/2011   Procedure: LUMBAR LAMINECTOMY/DECOMPRESSION MICRODISCECTOMY 2 LEVELS;  Surgeon: Floyce Stakes, MD;  Location: [place] [facility];  Service: Neurosurgery;  Laterality: Right;  Right Lumbar four-five Lumbar five sacral one Foraminotomies   LUMBAR WOUND DEBRIDEMENT N/A 09/01/2013   Procedure: LUMBAR WOUND DEBRIDEMENT;  Surgeon: Floyce Stakes, MD;  Location: [facility];  Service: Neurosurgery;  Laterality: N/A;   PLACEMENT OF LUMBAR DRAIN N/A 09/01/2013   Procedure: PLACEMENT OF LUMBAR DRAIN;  Surgeon: Floyce Stakes, MD;  Location: [facility];  Service: Neurosurgery;  Laterality: N/A;   TRANSFORAMINAL LUMBAR INTERBODY FUSION (TLIF) WITH PEDICLE SCREW FIXATION 2 LEVEL N/A 08/25/2013   Procedure:  Lumbar four-five, Lumbar five-Sacral one Transforaminal Lumbar Interbody Fusion ;  Surgeon: Floyce Stakes, MD;  Location: [place] [facility];  Service: Neurosurgery;  Laterality: N/A;    Family History  Problem Relation Age of Onset   Anesthesia problems Mother    COPD Mother    Diabetes Mother    Anesthesia problems Sister    Hypotension Neg Hx    Malignant hyperthermia Neg Hx    Pseudochol deficiency Neg Hx    Colon cancer Neg Hx    Esophageal cancer Neg Hx    Liver disease Neg Hx    Pancreatic cancer Neg Hx     Social History   Socioeconomic History   Marital  status: Married    Spouse name: Not on file   Number of children: 0   Years of education: 12   Highest education level: Not on file  Occupational History   Occupation: Disabled  Tobacco Use   Smoking status: Never   Smokeless tobacco: Never  Vaping Use   Vaping Use: Never used  Substance and Sexual Activity   Alcohol use: Yes    Alcohol/week: 0.0 standard drinks of alcohol    Comment: occ- 0.5 can beer every 2 months   Drug use: No   Sexual activity: Not Currently  Other Topics Concern   Not on file  Social History Narrative   Lives at home with husband.  Caffeine use: 1 cup coffee 3 times per week.    Social Determinants of Health   Financial Resource Strain: Not on file  Food Insecurity: Not on file  Transportation Needs: Not on file  Physical Activity: Not on file  Stress: Not on file  Social Connections: Not on file  Intimate Partner Violence: Not on file      Review of systems: All other review of systems negative except as mentioned in the HPI.   Physical Exam: Vitals:   06/18/22 1445  BP: 130/80  Pulse: 85   Body mass index is 39.99 kg/m. Gen:      No acute distress HEENT:  sclera anicteric Abd:      soft, non-tender; no palpable masses, no distension Ext:    No edema Neuro: alert and oriented x 3 Psych: normal mood and affect  Data Reviewed:  Reviewed labs, radiology imaging, old records and pertinent past GI work up   Assessment and Plan/Recommendations:  66 year old very pleasant female here for follow-up visit for lymphocytic colitis. Currently she is in clinical remission s/p budesonide extended taper.  She is experiencing worsening constipation combination of irritable bowel syndrome constipation and opiate-induced constipation Advised patient to do bowel purge with MiraLAX Start Movantik 12.5 mg daily and if able to tolerate will, will plan to increase it to 25 mg daily  Increase dietary fiber and water intake  Okay to use Levsin  every 8 hours as needed for abdominal cramping and worsening IBS symptoms  Return in 3 months   The patient was provided an opportunity to ask questions and all were answered. The patient agreed with the plan and demonstrated an understanding of the instructions.  Damaris Hippo , MD    CC: Rosalee Kaufman, *

## 2022-06-18 NOTE — Patient Instructions (Addendum)
Dr Silverio Decamp recommends that you complete a bowel purge (to clean out your bowels). Please do the following: Purchase a bottle of Miralax over the counter as well as a box of 5 mg dulcolax tablets. Take 4 dulcolax tablets. Wait 1 hour. You will then drink 6-8 capfuls of Miralax mixed in an adequate amount of water/juice/gatorade (you may choose which of these liquids to drink) over the next 2-3 hours. You should expect results within 1 to 6 hours after completing the bowel purge.  We have sent the following medications to your pharmacy for you to pick up at your convenience: Moventik 12.5 mg daily for 2 weeks then may increase to 25 mg daily.   _______________________________________________________  If you are age 55 or older, your body mass index should be between 23-30. Your Body mass index is 39.99 kg/m. If this is out of the aforementioned range listed, please consider follow up with your Primary Care Provider.  If you are age 25 or younger, your body mass index should be between 19-25. Your Body mass index is 39.99 kg/m. If this is out of the aformentioned range listed, please consider follow up with your Primary Care Provider.   ________________________________________________________  The [facility] providers would like to encourage you to use [facility] to communicate with providers for non-urgent requests or questions.  Due to long hold times on the telephone, sending your provider a message by [facility] may be a faster and more efficient way to get a response.  Please allow 48 business hours for a response.  Please remember that this is for non-urgent requests.  _______________________________________________________

## 2022-06-20 ENCOUNTER — Other Ambulatory Visit: Payer: Self-pay | Admitting: Registered Nurse

## 2022-06-20 DIAGNOSIS — G834 Cauda equina syndrome: Secondary | ICD-10-CM

## 2022-06-20 DIAGNOSIS — M5136 Other intervertebral disc degeneration, lumbar region: Secondary | ICD-10-CM

## 2022-06-24 NOTE — Telephone Encounter (Signed)
Medication List was Reviewed.  PMP was Reviewed,  Ms. Morace picked up her MS Contin on 06/20/2022.  [place] Prescription Refill denied.

## 2022-07-09 ENCOUNTER — Telehealth: Payer: Self-pay | Admitting: Registered Nurse

## 2022-07-09 NOTE — Telephone Encounter (Signed)
Patient is out of town due to family emergency in [place] and she has an appt with you on Monday 10/16 and won't be able to make it.  She said she will not be back until sometime mid November.  She wants to know what to do about her appointment and her medication.  Please advise.

## 2022-07-09 NOTE — Telephone Encounter (Signed)
Patient will be scheduled for My- Chart Visit

## 2022-07-15 ENCOUNTER — Encounter: Payer: Self-pay | Admitting: Registered Nurse

## 2022-07-15 ENCOUNTER — Encounter: Payer: Medicare Other | Attending: Physical Medicine and Rehabilitation | Admitting: Registered Nurse

## 2022-07-15 ENCOUNTER — Other Ambulatory Visit: Payer: Self-pay | Admitting: Registered Nurse

## 2022-07-15 DIAGNOSIS — G834 Cauda equina syndrome: Secondary | ICD-10-CM | POA: Insufficient documentation

## 2022-07-15 DIAGNOSIS — M5136 Other intervertebral disc degeneration, lumbar region: Secondary | ICD-10-CM | POA: Insufficient documentation

## 2022-07-15 MED ORDER — MORPHINE SULFATE ER 30 MG PO TBCR: EXTENDED_RELEASE_TABLET | ORAL | 0 refills | Status: DC

## 2022-07-15 MED ORDER — OXYCODONE-ACETAMINOPHEN 10-325 MG PO TABS: 1.0000 | ORAL_TABLET | Freq: Four times a day (QID) | ORAL | 0 refills | Status: DC | PRN

## 2022-07-15 MED ORDER — ALPRAZOLAM 0.25 MG PO TABS: ORAL_TABLET | ORAL | 2 refills | Status: DC

## 2022-07-15 NOTE — Progress Notes (Signed)
Subjective:    Patient ID: Sherry Wong, female    DOB: [birth year], 66 y.o.   MRN: [id number]  HPI: Sherry Wong is a 66 y.o. 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 We have  discussed the limitations of evaluation and management by telemedicine and the availability of in person appointments. The patient expressed understanding and agreed to proceed. She states her pain is located in her lower back radiating into her bilateral lower extremities and bilateral feet with tingling and numbness. She rates her pain 6. Her current exercise regime is walking and performing stretching exercises.  Ms. Wahlquist Morphine equivalent is 120.00 MME. She  is also prescribed Alprazolam .We have discussed the black box warning of using opioids and benzodiazepines. I highlighted the dangers of using these drugs together and discussed the adverse events including respiratory suppression, overdose, cognitive impairment and importance of compliance with current regimen. We will continue to monitor and adjust as indicated.   Ms. Battey Reports she has 11 tablets of her Morphine Sulfate 30 mg and 22 tablets of her Oxycodone 10/325.   Last UDS was Performed on 03/12/2022, it was consistent.     Pain Inventory Average Pain 6 Pain Right Now 6 My pain is constant, sharp, burning, dull, stabbing, tingling, and aching  In the last 24 hours, has pain interfered with the following? General activity 8 Relation with others 8 Enjoyment of life 9 What TIME of day is your pain at its worst? varies Sleep (in general) Fair  Pain is worse with: walking, bending, standing, and some activites Pain improves with: rest, heat/ice, and medication Relief from Meds: 8  Family History  Problem Relation Age of Onset   Anesthesia problems Mother    COPD Mother    Diabetes Mother    Anesthesia problems Sister    Hypotension Neg Hx    Malignant  hyperthermia Neg Hx    Pseudochol deficiency Neg Hx    Colon cancer Neg Hx    Esophageal cancer Neg Hx    Liver disease Neg Hx    Pancreatic cancer Neg Hx    Social History   Socioeconomic History   Marital status: Married    Spouse name: Not on file   Number of children: 0   Years of education: 12   Highest education level: Not on file  Occupational History   Occupation: Disabled  Tobacco Use   Smoking status: Never   Smokeless tobacco: Never  Vaping Use   Vaping Use: Never used  Substance and Sexual Activity   Alcohol use: Yes    Alcohol/week: 0.0 standard drinks of alcohol    Comment: occ- 0.5 can beer every 2 months   Drug use: No   Sexual activity: Not Currently  Other Topics Concern   Not on file  Social History Narrative   Lives at home with husband.   Caffeine use: 1 cup coffee 3 times per week.    Social Determinants of Health   Financial Resource Strain: Not on file  Food Insecurity: Not on file  Transportation Needs: Not on file  Physical Activity: Not on file  Stress: Not on file  Social Connections: Not on file   Past Surgical History:  Procedure Laterality Date   BREAST BIOPSY     right   BREAST LUMPECTOMY Left 2014   CHOLECYSTECTOMY     "gangrene in small intestines clipped by surgeon"  per patient   COLONOSCOPY  2009   Dr Rowe Pavy    cysts removed from ovary     DILATION AND CURETTAGE OF UTERUS     ELBOW SURGERY Left    ESOPHAGOGASTRODUODENOSCOPY     HEMATOMA EVACUATION N/A 08/25/2013   Procedure: EVACUATION OF LUMBAR HEMATOMA;  Surgeon: Floyce Stakes, MD;  Location: [facility];  Service: Neurosurgery;  Laterality: N/A;   LUMBAR LAMINECTOMY/DECOMPRESSION MICRODISCECTOMY  12/03/2011   Procedure: LUMBAR LAMINECTOMY/DECOMPRESSION MICRODISCECTOMY 2 LEVELS;  Surgeon: Floyce Stakes, MD;  Location: [facility];  Service: Neurosurgery;  Laterality: Right;  Right Lumbar four-five Lumbar five sacral one Foraminotomies   LUMBAR WOUND DEBRIDEMENT N/A  09/01/2013   Procedure: LUMBAR WOUND DEBRIDEMENT;  Surgeon: Floyce Stakes, MD;  Location: [facility];  Service: Neurosurgery;  Laterality: N/A;   PLACEMENT OF LUMBAR DRAIN N/A 09/01/2013   Procedure: PLACEMENT OF LUMBAR DRAIN;  Surgeon: Floyce Stakes, MD;  Location: [facility];  Service: Neurosurgery;  Laterality: N/A;   TRANSFORAMINAL LUMBAR INTERBODY FUSION (TLIF) WITH PEDICLE SCREW FIXATION 2 LEVEL N/A 08/25/2013   Procedure:  Lumbar four-five, Lumbar five-Sacral one Transforaminal Lumbar Interbody Fusion ;  Surgeon: Floyce Stakes, MD;  Location: [facility];  Service: Neurosurgery;  Laterality: N/A;   Past Surgical History:  Procedure Laterality Date   BREAST BIOPSY     right   BREAST LUMPECTOMY Left 2014   CHOLECYSTECTOMY     "gangrene in small intestines clipped by surgeon" per patient   COLONOSCOPY  2009   Dr Rowe Pavy    cysts removed from ovary     DILATION AND CURETTAGE OF UTERUS     ELBOW SURGERY Left    ESOPHAGOGASTRODUODENOSCOPY     HEMATOMA EVACUATION N/A 08/25/2013   Procedure: EVACUATION OF LUMBAR HEMATOMA;  Surgeon: Floyce Stakes, MD;  [facility];  Service: Neurosurgery;  Laterality: N/A;   LUMBAR LAMINECTOMY/DECOMPRESSION MICRODISCECTOMY  12/03/2011   Procedure: LUMBAR LAMINECTOMY/DECOMPRESSION MICRODISCECTOMY 2 LEVELS;  Surgeon: Floyce Stakes, MD;  Location: [facility];  Service: Neurosurgery;  Laterality: Right;  Right Lumbar four-five Lumbar five sacral one Foraminotomies   LUMBAR WOUND DEBRIDEMENT N/A 09/01/2013   Procedure: LUMBAR WOUND DEBRIDEMENT;  Surgeon: Floyce Stakes, MD;  [facility];  Service: Neurosurgery;  Laterality: N/A;   PLACEMENT OF LUMBAR DRAIN N/A 09/01/2013   Procedure: PLACEMENT OF LUMBAR DRAIN;  Surgeon: Floyce Stakes, MD;  [facility]: [facility];  Service: Neurosurgery;  Laterality: N/A;   TRANSFORAMINAL LUMBAR INTERBODY FUSION (TLIF) WITH PEDICLE SCREW FIXATION 2 LEVEL N/A 08/25/2013   Procedure:   Lumbar four-five, Lumbar five-Sacral one Transforaminal Lumbar Interbody Fusion ;  Surgeon: Floyce Stakes, MD;  Location: [facility];  Service: Neurosurgery;  Laterality: N/A;   Past Medical History:  Diagnosis Date   Anxiety    takes celexa daily   Arthritis    Asthma    Bronchitis    Chronic back pain    ddd/lumbago,and radiculopathy   COVID-17 Sep 2020   Depression    Gastric ulcer    GERD (gastroesophageal reflux disease)    takes Protonix daily   Gout    hx of   H/O hiatal hernia    Hx of colonic polyps    Hypertension    takes Losartan daily   Hypothyroidism    takes Levothyroxine daily   IBS (irritable bowel syndrome)    Nocturia    Pneumonia  hx of 15+yrs ago   PONV (postoperative nausea and vomiting)    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 Juliann Mule   Ulcerative colitis    Urinary incontinence    Ht 5' 4"  (1.626 m)   BMI 39.99 kg/m   Opioid Risk Score:   Fall Risk Score:  `1  Depression screen [facility] 2/9     05/13/2022   10:56 AM 03/12/2022   11:29 AM 01/14/2022   11:08 AM 11/19/2021   12:50 PM 09/18/2021    2:58 PM 07/19/2021    2:15 PM 05/24/2021    1:08 PM  Depression screen PHQ 2/9  Decreased Interest 0 2 3 3 1  0 0  Down, Depressed, Hopeless 0 2 3 3 1  0 0  PHQ - 2 Score 0 4 6 6 2  0 0     Review of Systems  Constitutional: Negative.   HENT: Negative.    Eyes: Negative.   Respiratory: Negative.    Cardiovascular: Negative.   Gastrointestinal: Negative.   Endocrine: Negative.   Genitourinary: Negative.   Musculoskeletal:  Positive for back pain.  Skin: Negative.   Allergic/Immunologic: Negative.   Neurological: Negative.   Hematological: Negative.   Psychiatric/Behavioral: Negative.        Objective:   Physical Exam Vitals and nursing note reviewed.  Musculoskeletal:     Comments: No Physical Exam: My: Chart Visit         Assessment & Plan:  1. Cauda equina syndrome: Incontinence: Wearing  Depends:Continue to Monitor. 07/15/2022. 2. Insomnia: Continue with Nortriptyline. 07/15/2022 3. 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 Continue current medication regimen. 07/15/2022 Refilled:  Percocet 10/325 mg #120--use 1 tablet every 6 hours as needed and  MS Contin 30 mg one tablet every 12 hours as needed #60. Second scripts sent for the following month We will continue the opioid monitoring program, this consists of regular clinic visits, examinations, urine drug screen, pill counts as well as use of [place] Controlled Substance Reporting system. A 12 month History has been reviewed on the [place] Controlled Substance Reporting System on 07/15/2022. 4. Depression: Continue with Cymbalta. Continue to Monitor. 07/15/2022 5. Anxiety: Continue with current medication regimen with Xanax. Continue to Monitor. 07/15/2022 6.Neuropathy:Continue:with current medication nregimen with  Pamelor. 07/15/2022 7. Constipation: Continue Senokot. Continue to Monitor. 07/15/2022 8.Muscle Spasm: Continue Baclofen.Continue to Monitor. 07/15/2022 9. Lumbar Radiculitis: Continue Pamelor. Continue to Monitor. 07/15/2022 10. Bilateral Feet Neuropathic Pain with tingling; . Continue current medication regimen. Continue to monitor. 10/16/20223 11. Bilateral Greater Trochanter Bursitis: Continue to Alternate Ice and Heat Therapy . Continue HEP as Tolerated. Continue to Monitor. 07/15/2022   F/U in 2 months  My Chart Visit Established Patient Location of Patient at her Niece Home Location of Provider: In the Office

## 2022-07-24 DIAGNOSIS — K219 Gastro-esophageal reflux disease without esophagitis: Secondary | ICD-10-CM | POA: Diagnosis not present

## 2022-07-24 DIAGNOSIS — E7849 Other hyperlipidemia: Secondary | ICD-10-CM | POA: Diagnosis not present

## 2022-07-24 DIAGNOSIS — R5381 Other malaise: Secondary | ICD-10-CM | POA: Diagnosis not present

## 2022-07-24 DIAGNOSIS — E039 Hypothyroidism, unspecified: Secondary | ICD-10-CM | POA: Diagnosis not present

## 2022-07-24 DIAGNOSIS — I10 Essential (primary) hypertension: Secondary | ICD-10-CM | POA: Diagnosis not present

## 2022-08-15 ENCOUNTER — Telehealth: Payer: Self-pay | Admitting: Registered Nurse

## 2022-08-15 NOTE — Telephone Encounter (Signed)
Patient is needing prescription for Oxyocodone 10, the script sent the pharmacy currently does not have in stock

## 2022-08-16 ENCOUNTER — Telehealth: Payer: Self-pay | Admitting: Registered Nurse

## 2022-08-16 MED ORDER — OXYCODONE HCL 10 MG PO TABS: 10.0000 mg | ORAL_TABLET | Freq: Four times a day (QID) | ORAL | 0 refills | Status: DC | PRN

## 2022-08-16 NOTE — Telephone Encounter (Signed)
Patient requesting oxycodone 10 be sent to [facility] given they do not have Percocet on stock. Please call pharmacy for questions [phone]

## 2022-08-16 NOTE — Telephone Encounter (Signed)
PMP was Reviewed.  [facility] was called,  Oxycodone 10-325 mg prescription canceled, out of stock. E-scribed Oxycodone 10 mg one tablet 4 times a day as needed for pain.  Call placed to Ms. Freda Munro, no answer left voicemail.

## 2022-09-10 DIAGNOSIS — E876 Hypokalemia: Secondary | ICD-10-CM | POA: Diagnosis not present

## 2022-09-10 DIAGNOSIS — E782 Mixed hyperlipidemia: Secondary | ICD-10-CM | POA: Diagnosis not present

## 2022-09-10 DIAGNOSIS — E039 Hypothyroidism, unspecified: Secondary | ICD-10-CM | POA: Diagnosis not present

## 2022-09-10 DIAGNOSIS — I10 Essential (primary) hypertension: Secondary | ICD-10-CM | POA: Diagnosis not present

## 2022-09-10 DIAGNOSIS — Z131 Encounter for screening for diabetes mellitus: Secondary | ICD-10-CM | POA: Diagnosis not present

## 2022-09-11 ENCOUNTER — Encounter: Payer: Medicare Other | Attending: Physical Medicine and Rehabilitation | Admitting: Registered Nurse

## 2022-09-11 VITALS — BP 143/86 | HR 81 | Ht 64.0 in | Wt 228.0 lb

## 2022-09-11 DIAGNOSIS — M62838 Other muscle spasm: Secondary | ICD-10-CM | POA: Diagnosis not present

## 2022-09-11 DIAGNOSIS — M5136 Other intervertebral disc degeneration, lumbar region: Secondary | ICD-10-CM | POA: Insufficient documentation

## 2022-09-11 DIAGNOSIS — M7062 Trochanteric bursitis, left hip: Secondary | ICD-10-CM | POA: Diagnosis not present

## 2022-09-11 DIAGNOSIS — Z79891 Long term (current) use of opiate analgesic: Secondary | ICD-10-CM | POA: Insufficient documentation

## 2022-09-11 DIAGNOSIS — M545 Low back pain, unspecified: Secondary | ICD-10-CM | POA: Diagnosis not present

## 2022-09-11 DIAGNOSIS — M7061 Trochanteric bursitis, right hip: Secondary | ICD-10-CM | POA: Insufficient documentation

## 2022-09-11 DIAGNOSIS — F3289 Other specified depressive episodes: Secondary | ICD-10-CM | POA: Insufficient documentation

## 2022-09-11 DIAGNOSIS — F411 Generalized anxiety disorder: Secondary | ICD-10-CM | POA: Insufficient documentation

## 2022-09-11 DIAGNOSIS — Z5181 Encounter for therapeutic drug level monitoring: Secondary | ICD-10-CM | POA: Insufficient documentation

## 2022-09-11 DIAGNOSIS — G894 Chronic pain syndrome: Secondary | ICD-10-CM | POA: Insufficient documentation

## 2022-09-11 DIAGNOSIS — G834 Cauda equina syndrome: Secondary | ICD-10-CM | POA: Insufficient documentation

## 2022-09-11 DIAGNOSIS — G8929 Other chronic pain: Secondary | ICD-10-CM | POA: Diagnosis not present

## 2022-09-11 DIAGNOSIS — M5416 Radiculopathy, lumbar region: Secondary | ICD-10-CM | POA: Insufficient documentation

## 2022-09-11 MED ORDER — MORPHINE SULFATE ER 30 MG PO TBCR: EXTENDED_RELEASE_TABLET | ORAL | 0 refills | Status: DC

## 2022-09-11 MED ORDER — OXYCODONE-ACETAMINOPHEN 10-325 MG PO TABS: 1.0000 | ORAL_TABLET | Freq: Four times a day (QID) | ORAL | 0 refills | Status: DC | PRN

## 2022-09-11 MED ORDER — NORTRIPTYLINE HCL 50 MG PO CAPS: ORAL_CAPSULE | ORAL | 2 refills | Status: DC

## 2022-09-11 MED ORDER — METHOCARBAMOL 500 MG PO TABS: 500.0000 mg | ORAL_TABLET | Freq: Two times a day (BID) | ORAL | 1 refills | Status: DC | PRN

## 2022-09-11 MED ORDER — ALPRAZOLAM 0.25 MG PO TABS: ORAL_TABLET | ORAL | 2 refills | Status: DC

## 2022-09-11 NOTE — Progress Notes (Signed)
Subjective:    Patient ID: Sherry Wong, female    DOB: 16-Jan-1956, 66 y.o.   MRN: [id number]  HPI: Sherry Wong is a 66 y.o. female who returns for follow up appointment for chronic pain and medication refill. She states her pain is located in her mid- lower back radiating into her bilateral hips and bilateral lower extremities. She also reports increase frequency of muscle spasm in her lower extremities. She rates her pain 5.Her  current exercise regime is walking and performing stretching exercises.  [name] Morphine equivalent is 120.0 MME.   UDS ordered today.    Pain Inventory Average Pain 6 Pain Right Now 5 My pain is sharp, burning, dull, stabbing, tingling, and aching  In the last 24 hours, has pain interfered with the following? General activity 8 Relation with others 8 Enjoyment of life 9 What TIME of day is your pain at its worst? morning , daytime, evening, and night Sleep (in general) Fair  Pain is worse with: walking, bending, standing, and some activites Pain improves with: rest and medication Relief from Meds: 5  Family History  Problem Relation Age of Onset   Anesthesia problems Mother    COPD Mother    Diabetes Mother    Anesthesia problems Sister    Hypotension Neg Hx    Malignant hyperthermia Neg Hx    Pseudochol deficiency Neg Hx    Colon cancer Neg Hx    Esophageal cancer Neg Hx    Liver disease Neg Hx    Pancreatic cancer Neg Hx    Social History   Socioeconomic History   Marital status: Married    Spouse name: Not on file   Number of children: 0   Years of education: 12   Highest education level: Not on file  Occupational History   Occupation: Disabled  Tobacco Use   Smoking status: Never   Smokeless tobacco: Never  Vaping Use   Vaping Use: Never used  Substance and Sexual Activity   Alcohol use: Yes    Alcohol/week: 0.0 standard drinks of alcohol    Comment: occ- 0.5 can beer every 2 months   Drug use: No   Sexual  activity: Not Currently  Other Topics Concern   Not on file  Social History Narrative   Lives at home with husband.   Caffeine use: 1 cup coffee 3 times per week.    Social Determinants of Health   Financial Resource Strain: Not on file  Food Insecurity: Not on file  Transportation Needs: Not on file  Physical Activity: Not on file  Stress: Not on file  Social Connections: Not on file   Past Surgical History:  Procedure Laterality Date   BREAST BIOPSY     right   BREAST LUMPECTOMY Left 2014   CHOLECYSTECTOMY     "gangrene in small intestines clipped by surgeon" per patient   COLONOSCOPY  2009   Dr Rowe Pavy    cysts removed from ovary     [place] Left    ESOPHAGOGASTRODUODENOSCOPY     HEMATOMA EVACUATION N/A 08/25/2013   Procedure: EVACUATION OF LUMBAR HEMATOMA;  Surgeon: Floyce Stakes, MD;  Location: [facility];  Service: Neurosurgery;  Laterality: N/A;   LUMBAR LAMINECTOMY/DECOMPRESSION MICRODISCECTOMY  12/03/2011   Procedure: LUMBAR LAMINECTOMY/DECOMPRESSION MICRODISCECTOMY 2 LEVELS;  Surgeon: Floyce Stakes, MD;  Location: [facility];  Service: Neurosurgery;  Laterality: Right;  Right Lumbar four-five  Lumbar five sacral one Foraminotomies   LUMBAR WOUND DEBRIDEMENT N/A 09/01/2013   Procedure: LUMBAR WOUND DEBRIDEMENT;  Surgeon: Floyce Stakes, MD;  [facility]: [facility];  Service: Neurosurgery;  Laterality: N/A;   PLACEMENT OF LUMBAR DRAIN N/A 09/01/2013   Procedure: PLACEMENT OF LUMBAR DRAIN;  Surgeon: Floyce Stakes, MD;  Location: [facility];  Service: Neurosurgery;  Laterality: N/A;   TRANSFORAMINAL LUMBAR INTERBODY FUSION (TLIF) WITH PEDICLE SCREW FIXATION 2 LEVEL N/A 08/25/2013   Procedure:  Lumbar four-five, Lumbar five-Sacral one Transforaminal Lumbar Interbody Fusion ;  Surgeon: Floyce Stakes, MD;  Location: [facility];  Service: Neurosurgery;  Laterality: N/A;   Past Surgical History:  Procedure Laterality  Date   BREAST BIOPSY     right   BREAST LUMPECTOMY Left 2014   CHOLECYSTECTOMY     "gangrene in small intestines clipped by surgeon" per patient   COLONOSCOPY  2009   Dr Rowe Pavy    cysts removed from ovary     DILATION AND CURETTAGE OF UTERUS     ELBOW SURGERY Left    ESOPHAGOGASTRODUODENOSCOPY     HEMATOMA EVACUATION N/A 08/25/2013   Procedure: EVACUATION OF LUMBAR HEMATOMA;  Surgeon: Floyce Stakes, MD;  [facility];  Service: Neurosurgery;  Laterality: N/A;   LUMBAR LAMINECTOMY/DECOMPRESSION MICRODISCECTOMY  12/03/2011   Procedure: LUMBAR LAMINECTOMY/DECOMPRESSION MICRODISCECTOMY 2 LEVELS;  Surgeon: Floyce Stakes, MD;  Location: [facility] [facility];  Service: Neurosurgery;  Laterality: Right;  Right Lumbar four-five Lumbar five sacral one Foraminotomies   LUMBAR WOUND DEBRIDEMENT N/A 09/01/2013   Procedure: LUMBAR WOUND DEBRIDEMENT;  Surgeon: Floyce Stakes, MD;  [facility]: [facility];  Service: Neurosurgery;  Laterality: N/A;   PLACEMENT OF LUMBAR DRAIN N/A 09/01/2013   Procedure: PLACEMENT OF LUMBAR DRAIN;  Surgeon: Floyce Stakes, MD;  Location: [facility];  Service: Neurosurgery;  Laterality: N/A;   TRANSFORAMINAL LUMBAR INTERBODY FUSION (TLIF) WITH PEDICLE SCREW FIXATION 2 LEVEL N/A 08/25/2013   Procedure:  Lumbar four-five, Lumbar five-Sacral one Transforaminal Lumbar Interbody Fusion ;  Surgeon: Floyce Stakes, MD;  Location: [facility];  Service: Neurosurgery;  Laterality: N/A;   Past Medical History:  Diagnosis Date   Anxiety    takes celexa daily   Arthritis    Asthma    Bronchitis    Chronic back pain    ddd/lumbago,and radiculopathy   COVID-17 Sep 2020   Depression    Gastric ulcer    GERD (gastroesophageal reflux disease)    takes Protonix daily   Gout    hx of   H/O hiatal hernia    Hx of colonic polyps    Hypertension    takes Losartan daily   Hypothyroidism    takes Levothyroxine daily   IBS (irritable bowel syndrome)    Nocturia     Pneumonia    hx of 15+yrs ago   PONV (postoperative nausea and vomiting)    Ulcerative (chronic) enterocolitis ([place]) 07/2020   Dr. Juliann Mule   Ulcerative colitis    Urinary incontinence    BP (!) 143/86   Pulse 81   Ht 5' 4"  (1.626 m)   Wt 228 lb (103.4 kg)   SpO2 98%   BMI 39.14 kg/m   Opioid Risk Score:   Fall Risk Score:  `1  Depression screen [facility] 2/9     05/13/2022   10:56 AM 03/12/2022   11:29 AM 01/14/2022   11:08 AM 11/19/2021  12:50 PM 09/18/2021    2:58 PM 07/19/2021    2:15 PM 05/24/2021    1:08 PM  Depression screen PHQ 2/9  Decreased Interest 0 2 3 3 1  0 0  Down, Depressed, Hopeless 0 2 3 3 1  0 0  PHQ - 2 Score 0 4 6 6 2  0 0      Review of Systems  Musculoskeletal:  Positive for back pain.       Bilateral leg pain  All other systems reviewed and are negative.     Objective:   Physical Exam Vitals and nursing note reviewed.  Constitutional:      Appearance: Normal appearance.  Cardiovascular:     Rate and Rhythm: Normal rate and regular rhythm.     Pulses: Normal pulses.     Heart sounds: Normal heart sounds.  Pulmonary:     Effort: Pulmonary effort is normal.     Breath sounds: Normal breath sounds.  Musculoskeletal:     Cervical back: Normal range of motion and neck supple.     Comments: Normal Muscle Bulk and Muscle Testing Reveals:  Upper Extremities: Full ROM and Muscle Strength 5/5 Lumbar Hypersensitivity Bilateral Greater Trochanter Tenderness. Lower Extremities : Decreased ROM and Muscle Strength 5/5 Bilateral Lower Extremities Flexion Produces Pain into her Bilateral Lower Extremities Arises from Table Slowly using cane for support Antalgic Gait     Skin:    General: Skin is warm and dry.  Neurological:     Mental Status: She is alert and oriented to person, place, and time.  Psychiatric:        Mood and Affect: Mood normal.        Behavior: Behavior normal.         Assessment & Plan:  1. Cauda equina syndrome:  Incontinence: Wearing Depends:Continue to Monitor. 09/11/2022. 2. Insomnia: Continue with Nortriptyline. 09/11/2022 3. 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 Continue current medication regimen. 09/11/2022 Refilled:  Percocet 10/325 mg #120--use 1 tablet every 6 hours as needed and  MS Contin 30 mg one tablet every 12 hours as needed #60. Second scripts sent for the following month We will continue the opioid monitoring program, this consists of regular clinic visits, examinations, urine drug screen, pill counts as well as use of [place] Controlled Substance Reporting system. A 12 month History has been reviewed on the [place] Controlled Substance Reporting System on 09/11/2022. 4. Depression: Continue with Cymbalta. Continue to Monitor. 09/11/2022 5. Anxiety: Continue with current medication regimen with Xanax. Continue to Monitor. 09/11/2022 6.Neuropathy:Continue:with current medication nregimen with  Pamelor. 09/11/2022 7. Constipation: Continue Senokot. Continue to Monitor. 09/11/2022 8.Muscle Spasm: RX: Methocarbamol. .Continue to Monitor. 09/11/2022 9. Lumbar Radiculitis: Continue Pamelor. Continue to Monitor. 09/11/2022 10. Bilateral Feet Neuropathic Pain with tingling; . Continue current medication regimen. Continue to monitor. 12/13/20223 11. Bilateral Greater Trochanter Bursitis: Continue to Alternate Ice and Heat Therapy . Continue HEP as Tolerated. Continue to Monitor. 09/11/2022   F/U in 2 months

## 2022-09-15 LAB — TOXASSURE SELECT,+ANTIDEPR,UR

## 2022-09-18 ENCOUNTER — Encounter: Payer: Self-pay | Admitting: Registered Nurse

## 2022-09-18 DIAGNOSIS — E7849 Other hyperlipidemia: Secondary | ICD-10-CM | POA: Diagnosis not present

## 2022-09-18 DIAGNOSIS — R030 Elevated blood-pressure reading, without diagnosis of hypertension: Secondary | ICD-10-CM | POA: Diagnosis not present

## 2022-09-18 DIAGNOSIS — Z23 Encounter for immunization: Secondary | ICD-10-CM | POA: Diagnosis not present

## 2022-09-18 DIAGNOSIS — E039 Hypothyroidism, unspecified: Secondary | ICD-10-CM | POA: Diagnosis not present

## 2022-09-18 DIAGNOSIS — K580 Irritable bowel syndrome with diarrhea: Secondary | ICD-10-CM | POA: Diagnosis not present

## 2022-09-18 DIAGNOSIS — R609 Edema, unspecified: Secondary | ICD-10-CM | POA: Diagnosis not present

## 2022-09-18 DIAGNOSIS — K219 Gastro-esophageal reflux disease without esophagitis: Secondary | ICD-10-CM | POA: Diagnosis not present

## 2022-09-18 DIAGNOSIS — Z1389 Encounter for screening for other disorder: Secondary | ICD-10-CM | POA: Diagnosis not present

## 2022-09-19 ENCOUNTER — Telehealth: Payer: Self-pay | Admitting: *Deleted

## 2022-09-19 NOTE — Telephone Encounter (Signed)
Urine drug screen for this encounter is consistent for prescribed medication 

## 2022-11-12 ENCOUNTER — Encounter: Payer: Self-pay | Admitting: Registered Nurse

## 2022-11-12 ENCOUNTER — Encounter: Payer: Medicare Other | Attending: Physical Medicine and Rehabilitation | Admitting: Registered Nurse

## 2022-11-12 VITALS — BP 118/80 | HR 89 | Ht 64.0 in | Wt 232.0 lb

## 2022-11-12 DIAGNOSIS — Z5181 Encounter for therapeutic drug level monitoring: Secondary | ICD-10-CM | POA: Insufficient documentation

## 2022-11-12 DIAGNOSIS — E7849 Other hyperlipidemia: Secondary | ICD-10-CM | POA: Diagnosis not present

## 2022-11-12 DIAGNOSIS — K219 Gastro-esophageal reflux disease without esophagitis: Secondary | ICD-10-CM | POA: Diagnosis not present

## 2022-11-12 DIAGNOSIS — M7061 Trochanteric bursitis, right hip: Secondary | ICD-10-CM | POA: Insufficient documentation

## 2022-11-12 DIAGNOSIS — G834 Cauda equina syndrome: Secondary | ICD-10-CM | POA: Diagnosis not present

## 2022-11-12 DIAGNOSIS — G894 Chronic pain syndrome: Secondary | ICD-10-CM | POA: Diagnosis not present

## 2022-11-12 DIAGNOSIS — F411 Generalized anxiety disorder: Secondary | ICD-10-CM | POA: Diagnosis present

## 2022-11-12 DIAGNOSIS — Z79891 Long term (current) use of opiate analgesic: Secondary | ICD-10-CM | POA: Diagnosis not present

## 2022-11-12 DIAGNOSIS — M5136 Other intervertebral disc degeneration, lumbar region: Secondary | ICD-10-CM | POA: Diagnosis not present

## 2022-11-12 DIAGNOSIS — I10 Essential (primary) hypertension: Secondary | ICD-10-CM | POA: Diagnosis not present

## 2022-11-12 DIAGNOSIS — M5416 Radiculopathy, lumbar region: Secondary | ICD-10-CM | POA: Diagnosis not present

## 2022-11-12 DIAGNOSIS — R5381 Other malaise: Secondary | ICD-10-CM | POA: Diagnosis not present

## 2022-11-12 DIAGNOSIS — E876 Hypokalemia: Secondary | ICD-10-CM | POA: Diagnosis not present

## 2022-11-12 DIAGNOSIS — E039 Hypothyroidism, unspecified: Secondary | ICD-10-CM | POA: Diagnosis not present

## 2022-11-12 MED ORDER — OXYCODONE-ACETAMINOPHEN 10-325 MG PO TABS: 1.0000 | ORAL_TABLET | Freq: Four times a day (QID) | ORAL | 0 refills | Status: DC | PRN

## 2022-11-12 MED ORDER — ALPRAZOLAM 0.25 MG PO TABS: ORAL_TABLET | ORAL | 2 refills | Status: DC

## 2022-11-12 MED ORDER — MORPHINE SULFATE ER 30 MG PO TBCR: EXTENDED_RELEASE_TABLET | ORAL | 0 refills | Status: DC

## 2022-11-12 NOTE — Progress Notes (Signed)
Subjective:    Patient ID: Sherry Wong, female    DOB: 29-Sep-1956, 67 y.o.   MRN: [id number]  HPI: Sherry Wong is a 67 y.o. female who returns for follow up appointment for chronic pain and medication refill. She states her pain is located in her lower back radiating into her bilateral lower extremities and bilateral feet. She also reports bilateral hip pain. She rates her pain 4. Her current exercise regime is walking and performing stretching exercises.  Ms. Staiano Morphine equivalent is 120.00 MME. She is also prescribed Alprazolam  .We have discussed the black box warning of using opioids and benzodiazepines. I highlighted the dangers of using these drugs together and discussed the adverse events including respiratory suppression, overdose, cognitive impairment and importance of compliance with current regimen. We will continue to monitor and adjust as indicated.     Last UDS was Performed on 09/11/2022, it was consistent.     Pain Inventory Average Pain 7 Pain Right Now 4 My pain is sharp, burning, dull, stabbing, tingling, and aching  In the last 24 hours, has pain interfered with the following? General activity 9 Relation with others 9 Enjoyment of life 10 What TIME of day is your pain at its worst? morning , daytime, evening, and night Sleep (in general) Fair  Pain is worse with: walking, bending, standing, and some activites Pain improves with: rest, heat/ice, pacing activities, and medication Relief from Meds: 4  Family History  Problem Relation Age of Onset   Anesthesia problems Mother    COPD Mother    Diabetes Mother    Anesthesia problems Sister    Hypotension Neg Hx    Malignant hyperthermia Neg Hx    Pseudochol deficiency Neg Hx    Colon cancer Neg Hx    Esophageal cancer Neg Hx    Liver disease Neg Hx    Pancreatic cancer Neg Hx    Social History   Socioeconomic History   Marital status: Married    Spouse name: Not on file   Number of  children: 0   Years of education: 12   Highest education level: Not on file  Occupational History   Occupation: Disabled  Tobacco Use   Smoking status: Never   Smokeless tobacco: Never  Vaping Use   Vaping Use: Never used  Substance and Sexual Activity   Alcohol use: Yes    Alcohol/week: 0.0 standard drinks of alcohol    Comment: occ- 0.5 can beer every 2 months   Drug use: No   Sexual activity: Not Currently  Other Topics Concern   Not on file  Social History Narrative   Lives at home with husband.   Caffeine use: 1 cup coffee 3 times per week.    Social Determinants of Health   Financial Resource Strain: Not on file  Food Insecurity: Not on file  Transportation Needs: Not on file  Physical Activity: Not on file  Stress: Not on file  Social Connections: Not on file   Past Surgical History:  Procedure Laterality Date   BREAST BIOPSY     right   BREAST LUMPECTOMY Left 2014   CHOLECYSTECTOMY     "gangrene in small intestines clipped by surgeon" per patient   COLONOSCOPY  2009   Dr Rowe Pavy    cysts removed from ovary     [place] Left    ESOPHAGOGASTRODUODENOSCOPY     HEMATOMA EVACUATION N/A 08/25/2013  Procedure: EVACUATION OF LUMBAR HEMATOMA;  Surgeon: Floyce Stakes, MD;  Location: [facility];  Service: Neurosurgery;  Laterality: N/A;   LUMBAR LAMINECTOMY/DECOMPRESSION MICRODISCECTOMY  12/03/2011   Procedure: LUMBAR LAMINECTOMY/DECOMPRESSION MICRODISCECTOMY 2 LEVELS;  Surgeon: Floyce Stakes, MD;  Location: [place] [facility];  Service: Neurosurgery;  Laterality: Right;  Right Lumbar four-five Lumbar five sacral one Foraminotomies   LUMBAR WOUND DEBRIDEMENT N/A 09/01/2013   Procedure: LUMBAR WOUND DEBRIDEMENT;  Surgeon: Floyce Stakes, MD;  Location: [facility];  Service: Neurosurgery;  Laterality: N/A;   PLACEMENT OF LUMBAR DRAIN N/A 09/01/2013   Procedure: PLACEMENT OF LUMBAR DRAIN;  Surgeon: Floyce Stakes, MD;   Location: [facility];  Service: Neurosurgery;  Laterality: N/A;   TRANSFORAMINAL LUMBAR INTERBODY FUSION (TLIF) WITH PEDICLE SCREW FIXATION 2 LEVEL N/A 08/25/2013   Procedure:  Lumbar four-five, Lumbar five-Sacral one Transforaminal Lumbar Interbody Fusion ;  Surgeon: Floyce Stakes, MD;  Location: [place] [facility];  Service: Neurosurgery;  Laterality: N/A;   Past Surgical History:  Procedure Laterality Date   BREAST BIOPSY     right   BREAST LUMPECTOMY Left 2014   CHOLECYSTECTOMY     "gangrene in small intestines clipped by surgeon" per patient   COLONOSCOPY  2009   Dr Rowe Pavy    cysts removed from ovary     DILATION AND CURETTAGE OF UTERUS     ELBOW SURGERY Left    ESOPHAGOGASTRODUODENOSCOPY     HEMATOMA EVACUATION N/A 08/25/2013   Procedure: EVACUATION OF LUMBAR HEMATOMA;  Surgeon: Floyce Stakes, MD;  Location: [facility];  Service: Neurosurgery;  Laterality: N/A;   LUMBAR LAMINECTOMY/DECOMPRESSION MICRODISCECTOMY  12/03/2011   Procedure: LUMBAR LAMINECTOMY/DECOMPRESSION MICRODISCECTOMY 2 LEVELS;  Surgeon: Floyce Stakes, MD;  Location: [place] [facility];  Service: Neurosurgery;  Laterality: Right;  Right Lumbar four-five Lumbar five sacral one Foraminotomies   LUMBAR WOUND DEBRIDEMENT N/A 09/01/2013   Procedure: LUMBAR WOUND DEBRIDEMENT;  Surgeon: Floyce Stakes, MD;  Location: [facility];  Service: Neurosurgery;  Laterality: N/A;   PLACEMENT OF LUMBAR DRAIN N/A 09/01/2013   Procedure: PLACEMENT OF LUMBAR DRAIN;  Surgeon: Floyce Stakes, MD;  Location: [facility];  Service: Neurosurgery;  Laterality: N/A;   TRANSFORAMINAL LUMBAR INTERBODY FUSION (TLIF) WITH PEDICLE SCREW FIXATION 2 LEVEL N/A 08/25/2013   Procedure:  Lumbar four-five, Lumbar five-Sacral one Transforaminal Lumbar Interbody Fusion ;  Surgeon: Floyce Stakes, MD;  Location: [place] [facility];  Service: Neurosurgery;  Laterality: N/A;   Past Medical History:  Diagnosis Date   Anxiety    takes celexa daily   Arthritis     Asthma    Bronchitis    Chronic back pain    ddd/lumbago,and radiculopathy   COVID-17 Sep 2020   Depression    Gastric ulcer    GERD (gastroesophageal reflux disease)    takes Protonix daily   Gout    hx of   H/O hiatal hernia    Hx of colonic polyps    Hypertension    takes Losartan daily   Hypothyroidism    takes Levothyroxine daily   IBS (irritable bowel syndrome)    Nocturia    Pneumonia    hx of 15+yrs ago   PONV (postoperative nausea and vomiting)    Ulcerative (chronic) enterocolitis ([place]) 07/2020   Dr. Juliann Mule   Ulcerative colitis    Urinary incontinence    BP 118/80   Pulse 89   Ht 5' 4"$  (  1.626 m)   Wt 232 lb (105.2 kg)   SpO2 97%   BMI 39.82 kg/m   Opioid Risk Score:   Fall Risk Score:  `1  Depression screen [facility] 2/9     05/13/2022   10:56 AM 03/12/2022   11:29 AM 01/14/2022   11:08 AM 11/19/2021   12:50 PM 09/18/2021    2:58 PM 07/19/2021    2:15 PM 05/24/2021    1:08 PM  Depression screen PHQ 2/9  Decreased Interest 0 2 3 3 1 $ 0 0  Down, Depressed, Hopeless 0 2 3 3 1 $ 0 0  PHQ - 2 Score 0 4 6 6 2 $ 0 0     Review of Systems  Musculoskeletal:        Buttock B/L leg pain  All other systems reviewed and are negative.      Objective:   Physical Exam        Assessment & Plan:  1. Cauda equina syndrome: Incontinence: Wearing Depends:Continue to Monitor. 11/12/2022. 2. Insomnia: Continue with Nortriptyline. 11/12/2022 3. 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 Continue current medication regimen. 11/12/2022 Refilled:  Percocet 10/325 mg #120--use 1 tablet every 6 hours as needed and  MS Contin 30 mg one tablet every 12 hours as needed #60. Second scripts sent for the following month We will continue the opioid monitoring program, this consists of regular clinic visits, examinations, urine drug screen, pill counts as well as use of [place] Controlled Substance Reporting system. A 12  month History has been reviewed on the [place] on 11/12/2022. 4. Depression: Continue with Cymbalta. Continue to Monitor. 11/12/2022 5. Anxiety: Continue with current medication regimen with Xanax. Educated on the [employer]. Continue to Monitor. 11/12/2022 6.Neuropathy:Continue:with current medication nregimen with  Pamelor. 11/12/2022 7. Constipation: Continue Senokot. Continue to Monitor. 11/12/2022 8.Muscle Spasm: Continue  Methocarbamol. .Continue to Monitor. 11/12/2022 9. Lumbar Radiculitis: Continue Pamelor. Continue to Monitor. 11/12/2022 10. Bilateral Feet Neuropathic Pain with tingling; . Continue current medication regimen. Continue to monitor. 02/13/20224 11. Bilateral Greater Trochanter Bursitis: Continue to Alternate Ice and Heat Therapy . Continue HEP as Tolerated. Continue to Monitor. 11/12/2022   F/U in 2 months

## 2022-11-14 ENCOUNTER — Other Ambulatory Visit: Payer: Self-pay | Admitting: Registered Nurse

## 2022-12-16 ENCOUNTER — Telehealth: Payer: Self-pay

## 2022-12-16 MED ORDER — OXYCODONE HCL 10 MG PO TABS: 10.0000 mg | ORAL_TABLET | Freq: Four times a day (QID) | ORAL | 0 refills | Status: DC | PRN

## 2022-12-16 NOTE — Telephone Encounter (Signed)
PMP was Reviewed.  Oxycodone 10/325 on backorder. E-scribed Oxycodone 10 mg Sherry Wong is aware of the above.

## 2022-12-16 NOTE — Telephone Encounter (Signed)
Patient called in and stated that the pharmacy does not have the 10-325 however they do have the oxycodone w/out tylenol and they also have 5-325 for oxycodone as well

## 2022-12-24 DIAGNOSIS — I10 Essential (primary) hypertension: Secondary | ICD-10-CM | POA: Diagnosis not present

## 2022-12-24 DIAGNOSIS — E7849 Other hyperlipidemia: Secondary | ICD-10-CM | POA: Diagnosis not present

## 2022-12-24 DIAGNOSIS — E039 Hypothyroidism, unspecified: Secondary | ICD-10-CM | POA: Diagnosis not present

## 2022-12-24 DIAGNOSIS — Z1231 Encounter for screening mammogram for malignant neoplasm of breast: Secondary | ICD-10-CM | POA: Diagnosis not present

## 2022-12-30 DIAGNOSIS — K580 Irritable bowel syndrome with diarrhea: Secondary | ICD-10-CM | POA: Diagnosis not present

## 2022-12-30 DIAGNOSIS — E7849 Other hyperlipidemia: Secondary | ICD-10-CM | POA: Diagnosis not present

## 2022-12-30 DIAGNOSIS — E039 Hypothyroidism, unspecified: Secondary | ICD-10-CM | POA: Diagnosis not present

## 2022-12-30 DIAGNOSIS — K219 Gastro-esophageal reflux disease without esophagitis: Secondary | ICD-10-CM | POA: Diagnosis not present

## 2022-12-30 DIAGNOSIS — R609 Edema, unspecified: Secondary | ICD-10-CM | POA: Diagnosis not present

## 2022-12-30 DIAGNOSIS — R030 Elevated blood-pressure reading, without diagnosis of hypertension: Secondary | ICD-10-CM | POA: Diagnosis not present

## 2022-12-30 DIAGNOSIS — H6990 Unspecified Eustachian tube disorder, unspecified ear: Secondary | ICD-10-CM | POA: Diagnosis not present

## 2022-12-30 DIAGNOSIS — J309 Allergic rhinitis, unspecified: Secondary | ICD-10-CM | POA: Diagnosis not present

## 2023-01-09 ENCOUNTER — Encounter: Payer: Medicare Other | Attending: Physical Medicine and Rehabilitation | Admitting: Registered Nurse

## 2023-01-09 ENCOUNTER — Encounter: Payer: Self-pay | Admitting: Registered Nurse

## 2023-01-09 VITALS — BP 139/84 | HR 91 | Ht 64.0 in | Wt 235.0 lb

## 2023-01-09 DIAGNOSIS — Z79891 Long term (current) use of opiate analgesic: Secondary | ICD-10-CM | POA: Diagnosis not present

## 2023-01-09 DIAGNOSIS — Z5181 Encounter for therapeutic drug level monitoring: Secondary | ICD-10-CM | POA: Diagnosis not present

## 2023-01-09 DIAGNOSIS — M5416 Radiculopathy, lumbar region: Secondary | ICD-10-CM | POA: Diagnosis not present

## 2023-01-09 DIAGNOSIS — G894 Chronic pain syndrome: Secondary | ICD-10-CM

## 2023-01-09 DIAGNOSIS — M51369 Other intervertebral disc degeneration, lumbar region without mention of lumbar back pain or lower extremity pain: Secondary | ICD-10-CM

## 2023-01-09 DIAGNOSIS — F411 Generalized anxiety disorder: Secondary | ICD-10-CM | POA: Diagnosis present

## 2023-01-09 DIAGNOSIS — M7061 Trochanteric bursitis, right hip: Secondary | ICD-10-CM | POA: Diagnosis not present

## 2023-01-09 DIAGNOSIS — M7062 Trochanteric bursitis, left hip: Secondary | ICD-10-CM | POA: Diagnosis not present

## 2023-01-09 DIAGNOSIS — M5136 Other intervertebral disc degeneration, lumbar region: Secondary | ICD-10-CM

## 2023-01-09 DIAGNOSIS — G834 Cauda equina syndrome: Secondary | ICD-10-CM

## 2023-01-09 MED ORDER — OXYCODONE-ACETAMINOPHEN 10-325 MG PO TABS: 1.0000 | ORAL_TABLET | Freq: Four times a day (QID) | ORAL | 0 refills | Status: DC | PRN

## 2023-01-09 MED ORDER — MORPHINE SULFATE ER 30 MG PO TBCR
EXTENDED_RELEASE_TABLET | ORAL | 0 refills | Status: DC
Start: 2023-01-09 — End: 2023-02-06

## 2023-01-09 NOTE — Patient Instructions (Signed)
Call or send a My- Chart Message on Monday 01/13/2023: Regarding your medication.

## 2023-01-09 NOTE — Progress Notes (Signed)
Subjective:    Patient ID: Sherry Wong, female    DOB: 10-Dec-1955, 67 y.o.   MRN: [id number]  HPI: Sherry Wong is a 67 y.o. female who returns for follow up appointment for chronic pain and medication refill. She states her pain is located in her lower back pain radiating into her bilateral hips and bilateral lower extremities. She also reports bilateral feet pain with tingling and burning.  She rates her  pain 7. Her current exercise regime is walking and performing stretching exercises.  Sherry Wong Morphine equivalent is 120.00 MME.   UDS ordered today.      Pain Inventory Average Pain 5 Pain Right Now 7 My pain is sharp, burning, stabbing, tingling, and aching  In the last 24 hours, has pain interfered with the following? General activity 9 Relation with others 9 Enjoyment of life 10 What TIME of day is your pain at its worst? morning , daytime, evening, and night Sleep (in general) NA  Pain is worse with: walking, bending, sitting, standing, and some activites Pain improves with: rest, heat/ice, pacing activities, and medication Relief from Meds: 4  Family History  Problem Relation Age of Onset   Anesthesia problems Mother    COPD Mother    Diabetes Mother    Anesthesia problems Sister    Hypotension Neg Hx    Malignant hyperthermia Neg Hx    Pseudochol deficiency Neg Hx    Colon cancer Neg Hx    Esophageal cancer Neg Hx    Liver disease Neg Hx    Pancreatic cancer Neg Hx    Social History   Socioeconomic History   Marital status: Married    Spouse name: Not on file   Number of children: 0   Years of education: 12   Highest education level: Not on file  Occupational History   Occupation: Disabled  Tobacco Use   Smoking status: Never   Smokeless tobacco: Never  Vaping Use   Vaping Use: Never used  Substance and Sexual Activity   Alcohol use: Yes    Alcohol/week: 0.0 standard drinks of alcohol    Comment: occ- 0.5 can beer every 2 months   Drug  use: No   Sexual activity: Not Currently  Other Topics Concern   Not on file  Social History Narrative   Lives at home with husband.   Caffeine use: 1 cup coffee 3 times per week.    Social Determinants of Health   Financial Resource Strain: Not on file  Food Insecurity: Not on file  Transportation Needs: Not on file  Physical Activity: Not on file  Stress: Not on file  Social Connections: Not on file   Past Surgical History:  Procedure Laterality Date   BREAST BIOPSY     right   BREAST LUMPECTOMY Left 2014   CHOLECYSTECTOMY     "gangrene in small intestines clipped by surgeon" per patient   COLONOSCOPY  2009   Dr Linna DarnerAnwar    cysts removed from ovary     DILATION AND CURETTAGE OF UTERUS     ELBOW SURGERY Left    ESOPHAGOGASTRODUODENOSCOPY     HEMATOMA EVACUATION N/A 08/25/2013   Procedure: EVACUATION OF LUMBAR HEMATOMA;  Surgeon: Karn CassisErnesto M Botero, MD;  Location: [facility];  Service: Neurosurgery;  Laterality: N/A;   LUMBAR LAMINECTOMY/DECOMPRESSION MICRODISCECTOMY  12/03/2011   Procedure: LUMBAR LAMINECTOMY/DECOMPRESSION MICRODISCECTOMY 2 LEVELS;  Surgeon: Karn CassisErnesto M Botero, MD;  Location: [facility];  Service: Neurosurgery;  Laterality:  Right;  Right Lumbar four-five Lumbar five sacral one Foraminotomies   LUMBAR WOUND DEBRIDEMENT N/A 09/01/2013   Procedure: LUMBAR WOUND DEBRIDEMENT;  Surgeon: Karn Cassis, MD;  [facility]: [facility];  Service: Neurosurgery;  Laterality: N/A;   PLACEMENT OF LUMBAR DRAIN N/A 09/01/2013   Procedure: PLACEMENT OF LUMBAR DRAIN;  Surgeon: Karn Cassis, MD;  Location: [facility];  Service: Neurosurgery;  Laterality: N/A;   TRANSFORAMINAL LUMBAR INTERBODY FUSION (TLIF) WITH PEDICLE SCREW FIXATION 2 LEVEL N/A 08/25/2013   Procedure:  Lumbar four-five, Lumbar five-Sacral one Transforaminal Lumbar Interbody Fusion ;  Surgeon: Karn Cassis, MD;  Location: [facility];  Service: Neurosurgery;  Laterality: N/A;   Past Surgical History:   Procedure Laterality Date   BREAST BIOPSY     right   BREAST LUMPECTOMY Left 2014   CHOLECYSTECTOMY     "gangrene in small intestines clipped by surgeon" per patient   COLONOSCOPY  2009   Dr Linna Darner    cysts removed from ovary     DILATION AND CURETTAGE OF UTERUS     ELBOW SURGERY Left    ESOPHAGOGASTRODUODENOSCOPY     HEMATOMA EVACUATION N/A 08/25/2013   Procedure: EVACUATION OF LUMBAR HEMATOMA;  Surgeon: Karn Cassis, MD;  [facility];  Service: Neurosurgery;  Laterality: N/A;   LUMBAR LAMINECTOMY/DECOMPRESSION MICRODISCECTOMY  12/03/2011   Procedure: LUMBAR LAMINECTOMY/DECOMPRESSION MICRODISCECTOMY 2 LEVELS;  Surgeon: Karn Cassis, MD;  [facility];  Service: Neurosurgery;  Laterality: Right;  Right Lumbar four-five Lumbar five sacral one Foraminotomies   LUMBAR WOUND DEBRIDEMENT N/A 09/01/2013   Procedure: LUMBAR WOUND DEBRIDEMENT;  Surgeon: Karn Cassis, MD;  [facility];  Service: Neurosurgery;  Laterality: N/A;   PLACEMENT OF LUMBAR DRAIN N/A 09/01/2013   Procedure: PLACEMENT OF LUMBAR DRAIN;  Surgeon: Karn Cassis, MD;  [facility];  Service: Neurosurgery;  Laterality: N/A;   TRANSFORAMINAL LUMBAR INTERBODY FUSION (TLIF) WITH PEDICLE SCREW FIXATION 2 LEVEL N/A 08/25/2013   Procedure:  Lumbar four-five, Lumbar five-Sacral one Transforaminal Lumbar Interbody Fusion ;  Surgeon: Karn Cassis, MD;  Location: [facility];  Service: Neurosurgery;  Laterality: N/A;   Past Medical History:  Diagnosis Date   Anxiety    takes celexa daily   Arthritis    Asthma    Bronchitis    Chronic back pain    ddd/lumbago,and radiculopathy   COVID-17 Sep 2020   Depression    Gastric ulcer    GERD (gastroesophageal reflux disease)    takes Protonix daily   Gout    hx of   H/O hiatal hernia    Hx of colonic polyps    Hypertension    takes Losartan daily   Hypothyroidism    takes Levothyroxine daily   IBS (irritable bowel  syndrome)    Nocturia    Pneumonia    hx of 15+yrs ago   PONV (postoperative nausea and vomiting)    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 Arta Silence   Ulcerative colitis    Urinary incontinence    BP 139/84   Pulse 91   Ht 5\' 4"  (1.626 m)   Wt 235 lb (106.6 kg)   SpO2 95%   BMI 40.34 kg/m   Opioid Risk Score:   Fall Risk Score:  `1  Depression screen [facility] 2/9     01/09/2023    1:47 PM 11/12/2022    1:19 PM 05/13/2022  10:56 AM 03/12/2022   11:29 AM 01/14/2022   11:08 AM 11/19/2021   12:50 PM 09/18/2021    2:58 PM  Depression screen PHQ 2/9  Decreased Interest 3 3 0 2 3 3 1   Down, Depressed, Hopeless 3 3 0 2 3 3 1   PHQ - 2 Score 6 6 0 4 6 6 2     Review of Systems  Constitutional: Negative.   HENT: Negative.    Eyes: Negative.   Respiratory: Negative.    Cardiovascular: Negative.   Gastrointestinal: Negative.   Endocrine: Negative.   Genitourinary: Negative.   Musculoskeletal:  Positive for back pain and gait problem.  Allergic/Immunologic: Negative.   Hematological: Negative.   Psychiatric/Behavioral: Negative.    All other systems reviewed and are negative.      Objective:   Physical Exam Vitals and nursing note reviewed.  Constitutional:      Appearance: Normal appearance.  Cardiovascular:     Rate and Rhythm: Normal rate and regular rhythm.     Pulses: Normal pulses.     Heart sounds: Normal heart sounds.  Pulmonary:     Effort: Pulmonary effort is normal.     Breath sounds: Normal breath sounds.  Musculoskeletal:     Cervical back: Normal range of motion and neck supple.     Comments: Normal Muscle Bulk and Muscle Testing Reveals:  Upper Extremities: Full ROM and Muscle Strength 5/5  Lumbar Paraspinal Tenderness: L-3-L-5 Bilateral Greater Trochanter Tenderness Lower Extremities: Full ROM and Muscle Strength 5/5 Arises from Table slowly using cane for support Narrow Based  Gait     Skin:    General: Skin is warm and dry.   Neurological:     Mental Status: She is alert and oriented to person, place, and time.  Psychiatric:        Mood and Affect: Mood normal.        Behavior: Behavior normal.         Assessment & Plan:  1. Cauda equina syndrome: Incontinence: Wearing Depends:Continue to Monitor. 01/09/2023. 2. Insomnia: Continue with Nortriptyline. 01/09/2023 3. 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 Continue current medication regimen. 01/09/2023 Refilled:  Percocet 10/325 mg #120--use 1 tablet every 6 hours as needed and  MS Contin 30 mg one tablet every 12 hours as needed #60. Second scripts sent for the following month We will continue the opioid monitoring program, this consists of regular clinic visits, examinations, urine drug screen, pill counts as well as use of [place] Controlled Substance Reporting system. A 12 month History has been reviewed on the [place] Controlled Substance Reporting System on 01/09/2023. 4. Depression: Continue with Cymbalta. Continue to Monitor. 01/09/2023 5. Anxiety: Continue with current medication regimen with Xanax. Educated on the [employer]. Continue to Monitor. 01/09/2023 6.Neuropathy:Continue:with current medication nregimen with  Pamelor. 01/09/2023 7. Constipation: Continue Senokot. Continue to Monitor. 01/09/2023 8.Muscle Spasm: Continue  Methocarbamol. .Continue to Monitor. 01/09/2023 9. Lumbar Radiculitis: Continue Pamelor. Continue to Monitor. 01/09/2023 10. Bilateral Feet Neuropathic Pain with tingling; . Continue current medication regimen. Continue to monitor. 04/11/20224 11. Bilateral Greater Trochanter Bursitis: Continue to Alternate Ice and Heat Therapy . Continue HEP as Tolerated. Continue to Monitor. 01/09/2023   F/U in 2 months

## 2023-01-15 LAB — TOXASSURE SELECT,+ANTIDEPR,UR

## 2023-01-28 ENCOUNTER — Telehealth: Payer: Self-pay | Admitting: *Deleted

## 2023-01-28 NOTE — Telephone Encounter (Signed)
Urine drug screen for this encounter is consistent for prescribed medication 

## 2023-02-06 ENCOUNTER — Telehealth: Payer: Self-pay | Admitting: Registered Nurse

## 2023-02-06 DIAGNOSIS — G834 Cauda equina syndrome: Secondary | ICD-10-CM

## 2023-02-06 DIAGNOSIS — M5136 Other intervertebral disc degeneration, lumbar region: Secondary | ICD-10-CM

## 2023-02-06 MED ORDER — OXYCODONE-ACETAMINOPHEN 10-325 MG PO TABS: 1.0000 | ORAL_TABLET | Freq: Four times a day (QID) | ORAL | 0 refills | Status: DC | PRN

## 2023-02-06 MED ORDER — MORPHINE SULFATE ER 30 MG PO TBCR
EXTENDED_RELEASE_TABLET | ORAL | 0 refills | Status: DC
Start: 2023-02-06 — End: 2023-03-12

## 2023-02-06 NOTE — Telephone Encounter (Signed)
PMP was Reviewed.  Morphine and Oxycodone e-scribed to pharmacy.  Sherry Wong is aware via My-Chart

## 2023-02-11 ENCOUNTER — Other Ambulatory Visit: Payer: Self-pay | Admitting: *Deleted

## 2023-02-12 ENCOUNTER — Telehealth: Payer: Self-pay | Admitting: Registered Nurse

## 2023-02-12 MED ORDER — OXYCODONE HCL 10 MG PO TABS: 10.0000 mg | ORAL_TABLET | Freq: Four times a day (QID) | ORAL | 0 refills | Status: DC | PRN

## 2023-02-12 NOTE — Telephone Encounter (Signed)
PMP was Reviewed. Sherry Wong out of stock Sherry Wong ; Prescription sent for Sherry 10 mg.  Sherry Wong was removed from her profile.  Sherry Wong is aware via My- Chart message.

## 2023-02-13 NOTE — Telephone Encounter (Signed)
Refill previously sent.

## 2023-03-12 ENCOUNTER — Encounter: Payer: Medicare Other | Attending: Physical Medicine and Rehabilitation | Admitting: Registered Nurse

## 2023-03-12 VITALS — BP 129/75 | HR 80 | Ht 64.0 in | Wt 234.2 lb

## 2023-03-12 DIAGNOSIS — M7061 Trochanteric bursitis, right hip: Secondary | ICD-10-CM | POA: Diagnosis not present

## 2023-03-12 DIAGNOSIS — M7062 Trochanteric bursitis, left hip: Secondary | ICD-10-CM | POA: Insufficient documentation

## 2023-03-12 DIAGNOSIS — G834 Cauda equina syndrome: Secondary | ICD-10-CM | POA: Insufficient documentation

## 2023-03-12 DIAGNOSIS — Z79891 Long term (current) use of opiate analgesic: Secondary | ICD-10-CM | POA: Diagnosis not present

## 2023-03-12 DIAGNOSIS — F411 Generalized anxiety disorder: Secondary | ICD-10-CM | POA: Diagnosis present

## 2023-03-12 DIAGNOSIS — G894 Chronic pain syndrome: Secondary | ICD-10-CM | POA: Insufficient documentation

## 2023-03-12 DIAGNOSIS — M5416 Radiculopathy, lumbar region: Secondary | ICD-10-CM | POA: Insufficient documentation

## 2023-03-12 DIAGNOSIS — Z5181 Encounter for therapeutic drug level monitoring: Secondary | ICD-10-CM | POA: Insufficient documentation

## 2023-03-12 DIAGNOSIS — M5136 Other intervertebral disc degeneration, lumbar region: Secondary | ICD-10-CM | POA: Insufficient documentation

## 2023-03-12 MED ORDER — MORPHINE SULFATE ER 30 MG PO TBCR
EXTENDED_RELEASE_TABLET | ORAL | 0 refills | Status: DC
Start: 2023-03-12 — End: 2023-05-12

## 2023-03-12 MED ORDER — OXYCODONE HCL 10 MG PO TABS: 10.0000 mg | ORAL_TABLET | Freq: Four times a day (QID) | ORAL | 0 refills | Status: DC | PRN

## 2023-03-12 MED ORDER — MORPHINE SULFATE ER 30 MG PO TBCR
EXTENDED_RELEASE_TABLET | ORAL | 0 refills | Status: DC
Start: 2023-03-12 — End: 2023-03-12

## 2023-03-12 NOTE — Progress Notes (Signed)
Subjective:    Patient ID: Sherry Wong, female    DOB: 07-27-1956, 67 y.o.   MRN: [id number]  HPI: Sherry Wong is a 67 y.o. female who returns for follow up appointment for chronic pain and medication refill. She states her pain is located in her lower back radiating into her bilateral hips and bilateral lower extremities. She rates her pain 6. Her current exercise regime is walking and performing stretching exercises.  [name] Morphine equivalent is 116.00 MME. She is also prescribed alprazolam. .We have discussed the black box warning of using opioids and benzodiazepines. I highlighted the dangers of using these drugs together and discussed the adverse events including respiratory suppression, overdose, cognitive impairment and importance of compliance with current regimen. We will continue to monitor and adjust as indicated.   Last UDS was Performed on 01/09/2023, it was consistent.     Pain Inventory Average Pain 8 Pain Right Now 6 My pain is sharp, burning, stabbing, tingling, and aching  In the last 24 hours, has pain interfered with the following? General activity 9 Relation with others 9 Enjoyment of life 9 What TIME of day is your pain at its worst? morning , daytime, evening, and night Sleep (in general) Fair  Pain is worse with: walking, bending, standing, and some activites Pain improves with: rest, heat/ice, pacing activities, and medication Relief from Meds: 6  Family History  Problem Relation Age of Onset   Anesthesia problems Mother    COPD Mother    Diabetes Mother    Anesthesia problems Sister    Hypotension Neg Hx    Malignant hyperthermia Neg Hx    Pseudochol deficiency Neg Hx    Colon cancer Neg Hx    Esophageal cancer Neg Hx    Liver disease Neg Hx    Pancreatic cancer Neg Hx    Social History   Socioeconomic History   Marital status: Widowed    Spouse name: Not on file   Number of children: 0   Years of education: 20   Highest  education level: Not on file  Occupational History   Occupation: Disabled  Tobacco Use   Smoking status: Never   Smokeless tobacco: Never  Vaping Use   Vaping Use: Never used  Substance and Sexual Activity   Alcohol use: Yes    Alcohol/week: 0.0 standard drinks of alcohol    Comment: occ- 0.5 can beer every 2 months   Drug use: No   Sexual activity: Not Currently  Other Topics Concern   Not on file  Social History Narrative   Lives at home with husband.   Caffeine use: 1 cup coffee 3 times per week.    Social Determinants of Health   Financial Resource Strain: Not on file  Food Insecurity: Not on file  Transportation Needs: Not on file  Physical Activity: Not on file  Stress: Not on file  Social Connections: Not on file   Past Surgical History:  Procedure Laterality Date   BREAST BIOPSY     right   BREAST LUMPECTOMY Left 2014   CHOLECYSTECTOMY     "gangrene in small intestines clipped by surgeon" per patient   COLONOSCOPY  2009   Dr Linna Darner    cysts removed from ovary     DILATION AND CURETTAGE OF UTERUS     ELBOW SURGERY Left    ESOPHAGOGASTRODUODENOSCOPY     HEMATOMA EVACUATION N/A 08/25/2013   Procedure: EVACUATION OF LUMBAR HEMATOMA;  Surgeon: Tanya Nones  Jeral Fruit, MD;  Location: [facility];  Service: Neurosurgery;  Laterality: N/A;   LUMBAR LAMINECTOMY/DECOMPRESSION MICRODISCECTOMY  12/03/2011   Procedure: LUMBAR LAMINECTOMY/DECOMPRESSION MICRODISCECTOMY 2 LEVELS;  Surgeon: Karn Cassis, MD;  Location: [facility];  Service: Neurosurgery;  Laterality: Right;  Right Lumbar four-five Lumbar five sacral one Foraminotomies   LUMBAR WOUND DEBRIDEMENT N/A 09/01/2013   Procedure: LUMBAR WOUND DEBRIDEMENT;  Surgeon: Karn Cassis, MD;  Location: [facility];  Service: Neurosurgery;  Laterality: N/A;   PLACEMENT OF LUMBAR DRAIN N/A 09/01/2013   Procedure: PLACEMENT OF LUMBAR DRAIN;  Surgeon: Karn Cassis, MD;  Location: [facility];  Service: Neurosurgery;   Laterality: N/A;   TRANSFORAMINAL LUMBAR INTERBODY FUSION (TLIF) WITH PEDICLE SCREW FIXATION 2 LEVEL N/A 08/25/2013   Procedure:  Lumbar four-five, Lumbar five-Sacral one Transforaminal Lumbar Interbody Fusion ;  Surgeon: Karn Cassis, MD;  Location: [facility];  Service: Neurosurgery;  Laterality: N/A;   Past Surgical History:  Procedure Laterality Date   BREAST BIOPSY     right   BREAST LUMPECTOMY Left 2014   CHOLECYSTECTOMY     "gangrene in small intestines clipped by surgeon" per patient   COLONOSCOPY  2009   Dr Linna Darner    cysts removed from ovary     DILATION AND CURETTAGE OF UTERUS     ELBOW SURGERY Left    ESOPHAGOGASTRODUODENOSCOPY     HEMATOMA EVACUATION N/A 08/25/2013   Procedure: EVACUATION OF LUMBAR HEMATOMA;  Surgeon: Karn Cassis, MD;  Location: [facility];  Service: Neurosurgery;  Laterality: N/A;   LUMBAR LAMINECTOMY/DECOMPRESSION MICRODISCECTOMY  12/03/2011   Procedure: LUMBAR LAMINECTOMY/DECOMPRESSION MICRODISCECTOMY 2 LEVELS;  Surgeon: Karn Cassis, MD;  Location: [facility];  Service: Neurosurgery;  Laterality: Right;  Right Lumbar four-five Lumbar five sacral one Foraminotomies   LUMBAR WOUND DEBRIDEMENT N/A 09/01/2013   Procedure: LUMBAR WOUND DEBRIDEMENT;  Surgeon: Karn Cassis, MD;  Location: [facility];  Service: Neurosurgery;  Laterality: N/A;   PLACEMENT OF LUMBAR DRAIN N/A 09/01/2013   Procedure: PLACEMENT OF LUMBAR DRAIN;  Surgeon: Karn Cassis, MD;  Location: [facility];  Service: Neurosurgery;  Laterality: N/A;   TRANSFORAMINAL LUMBAR INTERBODY FUSION (TLIF) WITH PEDICLE SCREW FIXATION 2 LEVEL N/A 08/25/2013   Procedure:  Lumbar four-five, Lumbar five-Sacral one Transforaminal Lumbar Interbody Fusion ;  Surgeon: Karn Cassis, MD;  Location: [facility];  Service: Neurosurgery;  Laterality: N/A;   Past Medical History:  Diagnosis Date   Anxiety    takes celexa daily   Arthritis    Asthma    Bronchitis    Chronic back pain     ddd/lumbago,and radiculopathy   COVID-17 Sep 2020   Depression    Gastric ulcer    GERD (gastroesophageal reflux disease)    takes Protonix daily   Gout    hx of   H/O hiatal hernia    Hx of colonic polyps    Hypertension    takes Losartan daily   Hypothyroidism    takes Levothyroxine daily   IBS (irritable bowel syndrome)    Nocturia    Pneumonia    hx of 15+yrs ago   PONV (postoperative nausea and vomiting)    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 Arta Silence   Ulcerative colitis    Urinary incontinence    BP 129/75   Pulse 80   Ht 5\' 4"  (1.626 m)   Wt 234 lb 3.2 oz (  106.2 kg)   SpO2 98%   BMI 40.20 kg/m   Opioid Risk Score:   Fall Risk Score:  `1  Depression screen PHQ 2/9     01/09/2023    1:47 PM 11/12/2022    1:19 PM 05/13/2022   10:56 AM 03/12/2022   11:29 AM 01/14/2022   11:08 AM 11/19/2021   12:50 PM 09/18/2021    2:58 PM  Depression screen PHQ 2/9  Decreased Interest 3 3 0 2 3 3 1   Down, Depressed, Hopeless 3 3 0 2 3 3 1   PHQ - 2 Score 6 6 0 4 6 6 2      Review of Systems  Musculoskeletal:        Bilateral leg pain  All other systems reviewed and are negative.     Objective:   Physical Exam Vitals and nursing note reviewed.  Constitutional:      Appearance: Normal appearance.  Cardiovascular:     Rate and Rhythm: Normal rate and regular rhythm.     Pulses: Normal pulses.     Heart sounds: Normal heart sounds.  Pulmonary:     Effort: Pulmonary effort is normal.     Breath sounds: Normal breath sounds.  Musculoskeletal:     Cervical back: Normal range of motion and neck supple.     Comments: Normal Muscle Bulk and Muscle Testing Reveals:  Upper Extremities: Full ROM and Muscle Strength 5/5 Lumbar Hypersensitivity Bilateral Greater Trochanter Tenderness Lower Extremities: Full ROM and Muscle Strength 5/5 Arises from Table slowly using cane for support' Antalgic Gait     Skin:    General: Skin is warm and dry.   Neurological:     Mental Status: She is alert and oriented to person, place, and time.  Psychiatric:        Mood and Affect: Mood normal.        Behavior: Behavior normal.         Assessment & Plan:  1. Cauda equina syndrome: Incontinence: Wearing Depends:Continue to Monitor. 03/12/2023. 2. Insomnia: Continue with Nortriptyline. 03/12/2023 3. 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 Continue current medication regimen. 03/12/2023 Refilled:  Percocet 10 mg #120--use 1 tablet every 6 hours as needed and  MS Contin 30 mg one tablet every 12 hours as needed #60. Second scripts sent for the following month We will continue the opioid monitoring program, this consists of regular clinic visits, examinations, urine drug screen, pill counts as well as use of [place] Controlled Substance Reporting system. A 12 month History has been reviewed on the [place] Controlled Substance Reporting System on 03/12/2023. 4. Depression: Continue with Cymbalta. Continue to Monitor. 03/12/2023 5. Anxiety: Continue with current medication regimen with Xanax. Educated on the [employer]. Continue to Monitor. 03/12/2023 6.Neuropathy:Continue:with current medication nregimen with  Pamelor. 03/12/2023 7. Constipation: Continue Senokot. Continue to Monitor. 03/12/2023 8.Muscle Spasm: Continue  Methocarbamol. .Continue to Monitor. 03/12/2023 9. Lumbar Radiculitis: Continue Pamelor. Continue to Monitor. 03/12/2023 10. Bilateral Feet Neuropathic Pain with tingling; . Continue current medication regimen. Continue to monitor. 06/12/20224 11. Bilateral Greater Trochanter Bursitis: Continue to Alternate Ice and Heat Therapy . Continue HEP as Tolerated. Continue to Monitor. 03/12/2023   F/U in 2 months

## 2023-03-16 ENCOUNTER — Encounter: Payer: Self-pay | Admitting: Registered Nurse

## 2023-03-21 DIAGNOSIS — M5442 Lumbago with sciatica, left side: Secondary | ICD-10-CM | POA: Diagnosis not present

## 2023-03-21 DIAGNOSIS — J019 Acute sinusitis, unspecified: Secondary | ICD-10-CM | POA: Diagnosis not present

## 2023-04-14 ENCOUNTER — Other Ambulatory Visit: Payer: Self-pay | Admitting: Registered Nurse

## 2023-04-15 DIAGNOSIS — E039 Hypothyroidism, unspecified: Secondary | ICD-10-CM | POA: Diagnosis not present

## 2023-04-15 DIAGNOSIS — E876 Hypokalemia: Secondary | ICD-10-CM | POA: Diagnosis not present

## 2023-04-15 DIAGNOSIS — R5383 Other fatigue: Secondary | ICD-10-CM | POA: Diagnosis not present

## 2023-04-15 DIAGNOSIS — I10 Essential (primary) hypertension: Secondary | ICD-10-CM | POA: Diagnosis not present

## 2023-04-15 DIAGNOSIS — E7849 Other hyperlipidemia: Secondary | ICD-10-CM | POA: Diagnosis not present

## 2023-04-22 DIAGNOSIS — K580 Irritable bowel syndrome with diarrhea: Secondary | ICD-10-CM | POA: Diagnosis not present

## 2023-04-22 DIAGNOSIS — J309 Allergic rhinitis, unspecified: Secondary | ICD-10-CM | POA: Diagnosis not present

## 2023-04-22 DIAGNOSIS — E876 Hypokalemia: Secondary | ICD-10-CM | POA: Diagnosis not present

## 2023-04-22 DIAGNOSIS — K219 Gastro-esophageal reflux disease without esophagitis: Secondary | ICD-10-CM | POA: Diagnosis not present

## 2023-04-22 DIAGNOSIS — E039 Hypothyroidism, unspecified: Secondary | ICD-10-CM | POA: Diagnosis not present

## 2023-04-22 DIAGNOSIS — I10 Essential (primary) hypertension: Secondary | ICD-10-CM | POA: Diagnosis not present

## 2023-04-22 DIAGNOSIS — E785 Hyperlipidemia, unspecified: Secondary | ICD-10-CM | POA: Diagnosis not present

## 2023-04-22 DIAGNOSIS — R609 Edema, unspecified: Secondary | ICD-10-CM | POA: Diagnosis not present

## 2023-05-12 ENCOUNTER — Encounter: Payer: Self-pay | Admitting: Registered Nurse

## 2023-05-12 ENCOUNTER — Encounter: Payer: Medicare Other | Attending: Registered Nurse | Admitting: Registered Nurse

## 2023-05-12 VITALS — BP 131/74 | HR 80 | Ht 64.0 in | Wt 234.8 lb

## 2023-05-12 DIAGNOSIS — M5136 Other intervertebral disc degeneration, lumbar region: Secondary | ICD-10-CM

## 2023-05-12 DIAGNOSIS — G629 Polyneuropathy, unspecified: Secondary | ICD-10-CM | POA: Insufficient documentation

## 2023-05-12 DIAGNOSIS — K5900 Constipation, unspecified: Secondary | ICD-10-CM | POA: Insufficient documentation

## 2023-05-12 DIAGNOSIS — Z5181 Encounter for therapeutic drug level monitoring: Secondary | ICD-10-CM | POA: Diagnosis not present

## 2023-05-12 DIAGNOSIS — R2689 Other abnormalities of gait and mobility: Secondary | ICD-10-CM

## 2023-05-12 DIAGNOSIS — F411 Generalized anxiety disorder: Secondary | ICD-10-CM | POA: Diagnosis not present

## 2023-05-12 DIAGNOSIS — M5116 Intervertebral disc disorders with radiculopathy, lumbar region: Secondary | ICD-10-CM | POA: Insufficient documentation

## 2023-05-12 DIAGNOSIS — F32A Depression, unspecified: Secondary | ICD-10-CM | POA: Diagnosis not present

## 2023-05-12 DIAGNOSIS — Z79891 Long term (current) use of opiate analgesic: Secondary | ICD-10-CM

## 2023-05-12 DIAGNOSIS — M7061 Trochanteric bursitis, right hip: Secondary | ICD-10-CM | POA: Diagnosis not present

## 2023-05-12 DIAGNOSIS — M7062 Trochanteric bursitis, left hip: Secondary | ICD-10-CM

## 2023-05-12 DIAGNOSIS — M5416 Radiculopathy, lumbar region: Secondary | ICD-10-CM | POA: Diagnosis not present

## 2023-05-12 DIAGNOSIS — G894 Chronic pain syndrome: Secondary | ICD-10-CM

## 2023-05-12 DIAGNOSIS — Z9181 History of falling: Secondary | ICD-10-CM | POA: Insufficient documentation

## 2023-05-12 DIAGNOSIS — M51369 Other intervertebral disc degeneration, lumbar region without mention of lumbar back pain or lower extremity pain: Secondary | ICD-10-CM

## 2023-05-12 DIAGNOSIS — M62838 Other muscle spasm: Secondary | ICD-10-CM | POA: Diagnosis not present

## 2023-05-12 DIAGNOSIS — Y92009 Unspecified place in unspecified non-institutional (private) residence as the place of occurrence of the external cause: Secondary | ICD-10-CM | POA: Diagnosis not present

## 2023-05-12 DIAGNOSIS — G834 Cauda equina syndrome: Secondary | ICD-10-CM

## 2023-05-12 DIAGNOSIS — W19XXXD Unspecified fall, subsequent encounter: Secondary | ICD-10-CM | POA: Diagnosis not present

## 2023-05-12 MED ORDER — MORPHINE SULFATE ER 30 MG PO TBCR
EXTENDED_RELEASE_TABLET | ORAL | 0 refills | Status: DC
Start: 2023-05-12 — End: 2023-07-10

## 2023-05-12 MED ORDER — MORPHINE SULFATE ER 30 MG PO TBCR
EXTENDED_RELEASE_TABLET | ORAL | 0 refills | Status: DC
Start: 2023-05-12 — End: 2023-05-12

## 2023-05-12 MED ORDER — OXYCODONE-ACETAMINOPHEN 10-325 MG PO TABS: 1.0000 | ORAL_TABLET | Freq: Four times a day (QID) | ORAL | 0 refills | Status: DC | PRN

## 2023-05-12 NOTE — Patient Instructions (Signed)
Oxycodone Extended-Release Capsules What is this medication? OXYCODONE (ox i KOE done) treats severe, chronic pain. It is prescribed when other pain medications do not work well enough or cannot be tolerated. It works by blocking pain signals in the brain. It belongs to a group of medications called opioids. It is a long-acting medication. Do not use it to treat sudden pain. This medicine may be used for other purposes; ask your health care provider or pharmacist if you have questions. COMMON BRAND NAME(S): XTAMPZA What should I tell my care team before I take this medication? They need to know if you have any of these conditions: Addison disease Brain tumor Frequently drink alcohol Gallbladder disease Head injury Heart disease History of pancreatitis Kidney disease Liver disease Low adrenal gland function Lung or breathing disease, such as asthma Mental health conditions Seizures Taken an MAOI like Marplan, Nardil, or Parnate in the last 14 days Thyroid disease Substance use disorder An unusual or allergic reaction to oxycodone, other medications, foods, dyes, or preservatives Pregnant or trying to get pregnant Breastfeeding How should I use this medication? Take this medication by mouth with water. Take it as directed on the prescription label at the same time every day. Do not cut, crush, or chew this medication. Swallow the capsules whole. You can take this medication with or without food. If it upsets your stomach, take it with food. Do not use it more often than directed. There may be unused or extra doses in the bottle after you finish your treatment. Talk to your care team if you have questions about your dose. A special MedGuide will be given to you by the pharmacist with each prescription and refill. Be sure to read this information carefully each time. Talk to your care team about the use of this medication in children. Special care may be needed. People 65 years and older  may have a stronger reaction and need a smaller dose. Overdosage: If you think you have taken too much of this medicine contact a poison control center or emergency room at once. NOTE: This medicine is only for you. Do not share this medicine with others. What if I miss a dose? If you miss a dose, take it as soon as you can. If it is almost time for your next dose, take only that dose. Do not take double or extra doses. What may interact with this medication? Alcohol Antihistamines for allergy, cough, and cold Antivirals for HIV or AIDS Atropine Certain antibiotics, such as clarithromycin, erythromycin, linezolid, rifampin Certain medications for anxiety or sleep Certain medications for bladder problems, such as oxybutynin, tolterodine Certain medications for depression, such as amitriptyline, fluoxetine, sertraline Certain medications for fungal infections, such as ketoconazole, itraconazole, voriconazole Certain medications for migraine headache, such as almotriptan, eletriptan, frovatriptan, naratriptan, rizatriptan, sumatriptan, zolmitriptan Certain medications for nausea or vomiting, such as dolasetron, ondansetron, palonosetron Certain medications for Parkinson disease, such as benztropine, trihexyphenidyl Certain medications for seizures, such as phenobarbital, phenytoin, primidone Certain medications for stomach problems, such as dicyclomine, hyoscyamine Certain medications for travel sickness, such as scopolamine Diuretics General anesthetics, such as halothane, isoflurane, methoxyflurane, propofol Ipratropium Local anesthetics, such as lidocaine, pramoxine, tetracaine MAOIs, such as Carbex, Eldepryl, Marplan, Nardil, and Parnate Medications that relax muscles for surgery Methylene blue Nilotinib Other opioid medications for pain or cough Phenothiazines, such as chlorpromazine, mesoridazine, prochlorperazine, thioridazine This list may not describe all possible interactions.  Give your health care provider a list of all the medicines, herbs, non-prescription drugs,  or dietary supplements you use. Also tell them if you smoke, drink alcohol, or use illegal drugs. Some items may interact with your medicine. What should I watch for while using this medication? Tell your care team if your pain does not go away, if it gets worse, or if you have new or a different type of pain. You may develop tolerance to this medication. Tolerance means that you will need a higher dose of the medication for pain relief. Tolerance is normal and is expected if you take this medication for a long time. Taking this medication with other substances that cause drowsiness, such as alcohol, benzodiazepines, or other opioids can cause serious side effects. Give your care team a list of all medications you use. They will tell you how much medication to take. Do not take more medication than directed. Call emergency services if you have problems breathing or staying awake. Long term use of this medication may cause your brain and body to depend on it. This can happen even when used as directed by your care team. You and your care team will work together to determine how long you will need to take this medication. If your care team wants you to stop this medication, the dose will be slowly lowered over time to reduce the risk of side effects. Naloxone is an emergency medication used for an opioid overdose. An overdose can happen if you take too much of an opioid. It can also happen if an opioid is taken with some other medications or substances such as alcohol. Know the symptoms of an overdose, such as trouble breathing, unusually tired or sleepy, or not being able to respond or wake up. Make sure to tell caregivers and close contacts where your naloxone is stored. Make sure they know how to use it. After naloxone is given, the person giving it must call emergency services. Naloxone is a temporary treatment. Repeat  doses may be needed. This medication may affect your coordination, reaction time, or judgment. Do not drive or operate machinery until you know how this medication affects you. Sit up or stand slowly to reduce the risk of dizzy or fainting spells. Drinking alcohol with this medication can increase the risk of these side effects. This medication will cause constipation. If you do not have a bowel movement for 3 days, call your care team. Your mouth may get dry. Chewing sugarless gum or sucking hard candy and drinking plenty of water may help. Contact your care team if the problem does not go away or is severe. Talk to your care team if you may be pregnant. Prolonged use of this medication during pregnancy can cause temporary withdrawal in a newborn. Talk to your care team before breastfeeding. Changes to your treatment plan may be needed. If you breastfeed while taking this medication, seek medical care right away if you notice the child has slow or noisy breathing, is unusually sleepy or not able to wake up, or is limp. Long-term use of this medication may cause infertility. Talk to your care team if you are concerned about your fertility. What side effects may I notice from receiving this medication? Side effects that you should report to your care team as soon as possible: Allergic reactions--skin rash, itching, hives, swelling of the face, lips, tongue, or throat CNS depression--slow or shallow breathing, shortness of breath, feeling faint, dizziness, confusion, difficulty staying awake Low adrenal gland function--nausea, vomiting, loss of appetite, unusual weakness or fatigue, dizziness Low blood pressure--dizziness, feeling  faint or lightheaded, blurry vision Side effects that usually do not require medical attention (report to your care team if they continue or are bothersome): Constipation Dizziness Drowsiness Dry mouth Headache Nausea Vomiting This list may not describe all possible side  effects. Call your doctor for medical advice about side effects. You may report side effects to FDA at 1-800-FDA-1088. Where should I keep my medication? Keep out of the reach of children and pets. This medication can be abused. Keep it in a safe place to protect it from theft. Do not share it with anyone. It is only for you. Selling or giving away this medication is dangerous and against the law. Store at room temperature between 15 and 30 degrees C (59 and 86 degrees F). Protect from light. Keep the container tightly closed. This medication may cause harm and death if it is taken by other adults, children, or pets. Return medication that has not been used to an official disposal site. Contact the DEA at (774)861-4060 or your city/county government to find a site. If you cannot return the medication, flush it down the toilet. Do not use the medication after the expiration date. NOTE: This sheet is a summary. It may not cover all possible information. If you have questions about this medicine, talk to your doctor, pharmacist, or health care provider.  2024 Elsevier/Gold Standard (2022-10-17 00:00:00)

## 2023-05-12 NOTE — Progress Notes (Unsigned)
Subjective:    Patient ID: Sherry Wong, female    DOB: [birth year], 67 y.o.   MRN: [id number]  HPI: Sherry Wong is a 67 y.o. female who returns for follow up appointment for chronic pain and medication refill. states *** pain is located in  ***. rates pain ***. current exercise regime is walking and performing stretching exercises.  [name] Morphine equivalent is *** MME.   she is also prescribed *** by Dr. [name] .We have discussed the black box warning of using opioids and benzodiazepines. I highlighted the dangers of using these drugs together and discussed the adverse events including respiratory suppression, overdose, cognitive impairment and importance of compliance with current regimen. We will continue to monitor and adjust as indicated.  she is being closely monitored and under the care of her psychiatrist________. she verbalizes understanding.    Last UDS was Performed on 01/09/2023, it was consistent.      Pain Inventory Average Pain 7 Pain Right Now 5 My pain is sharp, burning, dull, stabbing, tingling, and aching  In the last 24 hours, has pain interfered with the following? General activity 9 Relation with others 8 Enjoyment of life 10 What TIME of day is your pain at its worst? morning , daytime, evening, and night Sleep (in general) Fair  Pain is worse with: walking, bending, sitting, standing, and some activites Pain improves with: rest, pacing activities, and medication Relief from Meds: 4  Family History  Problem Relation Age of Onset   Anesthesia problems Mother    COPD Mother    Diabetes Mother    Anesthesia problems Sister    Hypotension Neg Hx    Malignant hyperthermia Neg Hx    Pseudochol deficiency Neg Hx    Colon cancer Neg Hx    Esophageal cancer Neg Hx    Liver disease Neg Hx    Pancreatic cancer Neg Hx    Social History   Socioeconomic History   Marital status: Widowed    Spouse name: Not on file   Number of children: 0   Years  of education: 5   Highest education level: Not on file  Occupational History   Occupation: Disabled  Tobacco Use   Smoking status: Never   Smokeless tobacco: Never  Vaping Use   Vaping status: Never Used  Substance and Sexual Activity   Alcohol use: Yes    Alcohol/week: 0.0 standard drinks of alcohol    Comment: occ- 0.5 can beer every 2 months   Drug use: No   Sexual activity: Not Currently  Other Topics Concern   Not on file  Social History Narrative   Lives at home with husband.   Caffeine use: 1 cup coffee 3 times per week.    Social Determinants of Health   Financial Resource Strain: Not on file  Food Insecurity: Not on file  Transportation Needs: Not on file  Physical Activity: Not on file  Stress: Not on file  Social Connections: Not on file   Past Surgical History:  Procedure Laterality Date   BREAST BIOPSY     right   BREAST LUMPECTOMY Left 2014   CHOLECYSTECTOMY     "gangrene in small intestines clipped by surgeon" per patient   COLONOSCOPY  2009   Dr Linna Darner    cysts removed from ovary     DILATION AND CURETTAGE OF UTERUS     ELBOW SURGERY Left    ESOPHAGOGASTRODUODENOSCOPY     HEMATOMA EVACUATION N/A 08/25/2013  Procedure: EVACUATION OF LUMBAR HEMATOMA;  Surgeon: Karn Cassis, MD;  Location: [facility];  Service: Neurosurgery;  Laterality: N/A;   LUMBAR LAMINECTOMY/DECOMPRESSION MICRODISCECTOMY  12/03/2011   Procedure: LUMBAR LAMINECTOMY/DECOMPRESSION MICRODISCECTOMY 2 LEVELS;  Surgeon: Karn Cassis, MD;  Location: [facility];  Service: Neurosurgery;  Laterality: Right;  Right Lumbar four-five Lumbar five sacral one Foraminotomies   LUMBAR WOUND DEBRIDEMENT N/A 09/01/2013   Procedure: LUMBAR WOUND DEBRIDEMENT;  Surgeon: Karn Cassis, MD;  Location: [facility];  Service: Neurosurgery;  Laterality: N/A;   PLACEMENT OF LUMBAR DRAIN N/A 09/01/2013   Procedure: PLACEMENT OF LUMBAR DRAIN;  Surgeon: Karn Cassis, MD;  Location: [facility];   Service: Neurosurgery;  Laterality: N/A;   TRANSFORAMINAL LUMBAR INTERBODY FUSION (TLIF) WITH PEDICLE SCREW FIXATION 2 LEVEL N/A 08/25/2013   Procedure:  Lumbar four-five, Lumbar five-Sacral one Transforaminal Lumbar Interbody Fusion ;  Surgeon: Karn Cassis, MD;  Location: [facility];  Service: Neurosurgery;  Laterality: N/A;   Past Surgical History:  Procedure Laterality Date   BREAST BIOPSY     right   BREAST LUMPECTOMY Left 2014   CHOLECYSTECTOMY     "gangrene in small intestines clipped by surgeon" per patient   COLONOSCOPY  2009   Dr Linna Darner    cysts removed from ovary     DILATION AND CURETTAGE OF UTERUS     ELBOW SURGERY Left    ESOPHAGOGASTRODUODENOSCOPY     HEMATOMA EVACUATION N/A 08/25/2013   Procedure: EVACUATION OF LUMBAR HEMATOMA;  Surgeon: Karn Cassis, MD;  Location: [facility];  Service: Neurosurgery;  Laterality: N/A;   LUMBAR LAMINECTOMY/DECOMPRESSION MICRODISCECTOMY  12/03/2011   Procedure: LUMBAR LAMINECTOMY/DECOMPRESSION MICRODISCECTOMY 2 LEVELS;  Surgeon: Karn Cassis, MD;  Location: [facility];  Service: Neurosurgery;  Laterality: Right;  Right Lumbar four-five Lumbar five sacral one Foraminotomies   LUMBAR WOUND DEBRIDEMENT N/A 09/01/2013   Procedure: LUMBAR WOUND DEBRIDEMENT;  Surgeon: Karn Cassis, MD;  Location: [facility];  Service: Neurosurgery;  Laterality: N/A;   PLACEMENT OF LUMBAR DRAIN N/A 09/01/2013   Procedure: PLACEMENT OF LUMBAR DRAIN;  Surgeon: Karn Cassis, MD;  Location: [facility];  Service: Neurosurgery;  Laterality: N/A;   TRANSFORAMINAL LUMBAR INTERBODY FUSION (TLIF) WITH PEDICLE SCREW FIXATION 2 LEVEL N/A 08/25/2013   Procedure:  Lumbar four-five, Lumbar five-Sacral one Transforaminal Lumbar Interbody Fusion ;  Surgeon: Karn Cassis, MD;  Location: [facility];  Service: Neurosurgery;  Laterality: N/A;   Past Medical History:  Diagnosis Date   Anxiety    takes celexa daily   Arthritis    Asthma    Bronchitis     Chronic back pain    ddd/lumbago,and radiculopathy   COVID-17 Sep 2020   Depression    Gastric ulcer    GERD (gastroesophageal reflux disease)    takes Protonix daily   Gout    hx of   H/O hiatal hernia    Hx of colonic polyps    Hypertension    takes Losartan daily   Hypothyroidism    takes Levothyroxine daily   IBS (irritable bowel syndrome)    Nocturia    Pneumonia    hx of 15+yrs ago   PONV (postoperative nausea and vomiting)    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 Arta Silence   Ulcerative colitis    Urinary incontinence    BP 131/74   Pulse 80   Ht 5\' 4"  (  1.626 m)   Wt 234 lb 12.8 oz (106.5 kg)   SpO2 99%   BMI 40.30 kg/m   Opioid Risk Score:   Fall Risk Score:  `1  Depression screen PHQ 2/9     05/12/2023    1:39 PM 01/09/2023    1:47 PM 11/12/2022    1:19 PM 05/13/2022   10:56 AM 03/12/2022   11:29 AM 01/14/2022   11:08 AM 11/19/2021   12:50 PM  Depression screen PHQ 2/9  Decreased Interest 3 3 3  0 2 3 3   Down, Depressed, Hopeless 3 3 3  0 2 3 3   PHQ - 2 Score 6 6 6  0 4 6 6     Review of Systems  Constitutional: Negative.   HENT: Negative.    Eyes: Negative.   Respiratory: Negative.    Cardiovascular: Negative.   Gastrointestinal: Negative.   Endocrine: Negative.   Genitourinary: Negative.   Musculoskeletal:  Positive for arthralgias, back pain and gait problem.  Skin: Negative.   Allergic/Immunologic: Negative.   Hematological: Negative.   Psychiatric/Behavioral:  Positive for dysphoric mood.   All other systems reviewed and are negative.      Objective:   Physical Exam        Assessment & Plan:  1. Cauda equina syndrome: Incontinence: Wearing Depends:Continue to Monitor. 03/12/2023. 2. Insomnia: Continue with Nortriptyline. 03/12/2023 3. 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 Continue current medication regimen. 03/12/2023 Refilled:  Percocet 10 mg #120--use 1  tablet every 6 hours as needed and  MS Contin 30 mg one tablet every 12 hours as needed #60. Second scripts sent for the following month We will continue the opioid monitoring program, this consists of regular clinic visits, examinations, urine drug screen, pill counts as well as use of [place] Controlled Substance Reporting system. A 12 month History has been reviewed on the [place] Controlled Substance Reporting System on 03/12/2023. 4. Depression: Continue with Cymbalta. Continue to Monitor. 03/12/2023 5. Anxiety: Continue with current medication regimen with Xanax. Educated on the [employer]. Continue to Monitor. 03/12/2023 6.Neuropathy:Continue:with current medication nregimen with  Pamelor. 03/12/2023 7. Constipation: Continue Senokot. Continue to Monitor. 03/12/2023 8.Muscle Spasm: Continue  Methocarbamol. .Continue to Monitor. 03/12/2023 9. Lumbar Radiculitis: Continue Pamelor. Continue to Monitor. 03/12/2023 10. Bilateral Feet Neuropathic Pain with tingling; . Continue current medication regimen. Continue to monitor. 06/12/20224 11. Bilateral Greater Trochanter Bursitis: Continue to Alternate Ice and Heat Therapy . Continue HEP as Tolerated. Continue to Monitor. 03/12/2023   F/U in 2 months

## 2023-05-13 ENCOUNTER — Telehealth: Payer: Self-pay | Admitting: Registered Nurse

## 2023-05-13 NOTE — Telephone Encounter (Signed)
Call placed to Sherry Wong regarding Dr Riley Kill input as it relates to Qutenza. She verbalizes understanding. She declines Qutenza at this time, she states. We will continue to monitor.

## 2023-05-13 NOTE — Telephone Encounter (Signed)
Dr Riley Kill,  Sherry Wong reports  having increase intensity  of neuropathic pain: Lumbar and Bilateral feet. I discussed Qutenza with her. Are you in agreement and if so do you want her scheduled with you or another doctor due to your scheduled load.[name]

## 2023-06-05 DIAGNOSIS — E039 Hypothyroidism, unspecified: Secondary | ICD-10-CM | POA: Diagnosis not present

## 2023-06-12 ENCOUNTER — Other Ambulatory Visit: Payer: Self-pay | Admitting: Physical Medicine & Rehabilitation

## 2023-06-12 ENCOUNTER — Other Ambulatory Visit: Payer: Self-pay | Admitting: Registered Nurse

## 2023-06-18 DIAGNOSIS — J019 Acute sinusitis, unspecified: Secondary | ICD-10-CM | POA: Diagnosis not present

## 2023-07-09 NOTE — Progress Notes (Unsigned)
Subjective:    Patient ID: Sherry Wong, female    DOB: Jul 28, 1956, 67 y.o.   MRN: [id number]  HPI: Sherry Wong is a 67 y.o. female who returns for follow up appointment for chronic pain and medication refill. She states her pain is located in her lower back radiating into her bilateral hips and bilateral lower extremities. She rates her pain 5. Her current exercise regime is walking and performing stretching exercises.  Sherry Wong Morphine equivalent is 120.00 MME. She is also prescribed Alprazolam  .We have discussed the black box warning of using opioids and benzodiazepines. I highlighted the dangers of using these drugs together and discussed the adverse events including respiratory suppression, overdose, cognitive impairment and importance of compliance with current regimen. We will continue to monitor and adjust as indicated.  .   UDS ordered today.      Pain Inventory Average Pain 7 Pain Right Now 5 My pain is constant, sharp, burning, dull, stabbing, tingling, and aching  In the last 24 hours, has pain interfered with the following? General activity 8 Relation with others 8 Enjoyment of life 9 What TIME of day is your pain at its worst? morning , daytime, evening, and night Sleep (in general)  poor to fair  Pain is worse with: walking, bending, standing, and some activites Pain improves with: rest and medication Relief from Meds: 6  Family History  Problem Relation Age of Onset   Anesthesia problems Mother    COPD Mother    Diabetes Mother    Anesthesia problems Sister    Hypotension Neg Hx    Malignant hyperthermia Neg Hx    Pseudochol deficiency Neg Hx    Colon cancer Neg Hx    Esophageal cancer Neg Hx    Liver disease Neg Hx    Pancreatic cancer Neg Hx    Social History   Socioeconomic History   Marital status: Widowed    Spouse name: Not on file   Number of children: 0   Years of education: 52   Highest education level: Not on file  Occupational  History   Occupation: Disabled  Tobacco Use   Smoking status: Never   Smokeless tobacco: Never  Vaping Use   Vaping status: Never Used  Substance and Sexual Activity   Alcohol use: Yes    Alcohol/week: 0.0 standard drinks of alcohol    Comment: occ- 0.5 can beer every 2 months   Drug use: No   Sexual activity: Not Currently  Other Topics Concern   Not on file  Social History Narrative   Lives at home with husband.   Caffeine use: 1 cup coffee 3 times per week.    Social Determinants of Health   Financial Resource Strain: Not on file  Food Insecurity: Not on file  Transportation Needs: Not on file  Physical Activity: Not on file  Stress: Not on file  Social Connections: Not on file   Past Surgical History:  Procedure Laterality Date   BREAST BIOPSY     right   BREAST LUMPECTOMY Left 2014   CHOLECYSTECTOMY     "gangrene in small intestines clipped by surgeon" per patient   COLONOSCOPY  2009   Dr Linna Darner    cysts removed from ovary     DILATION AND CURETTAGE OF UTERUS     ELBOW SURGERY Left    ESOPHAGOGASTRODUODENOSCOPY     HEMATOMA EVACUATION N/A 08/25/2013   Procedure: EVACUATION OF LUMBAR HEMATOMA;  Surgeon: Tanya Nones  Jeral Fruit, MD;  Location: [facility];  Service: Neurosurgery;  Laterality: N/A;   LUMBAR LAMINECTOMY/DECOMPRESSION MICRODISCECTOMY  12/03/2011   Procedure: LUMBAR LAMINECTOMY/DECOMPRESSION MICRODISCECTOMY 2 LEVELS;  Surgeon: Karn Cassis, MD;  Location: [facility];  Service: Neurosurgery;  Laterality: Right;  Right Lumbar four-five Lumbar five sacral one Foraminotomies   LUMBAR WOUND DEBRIDEMENT N/A 09/01/2013   Procedure: LUMBAR WOUND DEBRIDEMENT;  Surgeon: Karn Cassis, MD;  Location: [facility];  Service: Neurosurgery;  Laterality: N/A;   PLACEMENT OF LUMBAR DRAIN N/A 09/01/2013   Procedure: PLACEMENT OF LUMBAR DRAIN;  Surgeon: Karn Cassis, MD;  Location: [facility];  Service: Neurosurgery;  Laterality: N/A;   TRANSFORAMINAL LUMBAR  INTERBODY FUSION (TLIF) WITH PEDICLE SCREW FIXATION 2 LEVEL N/A 08/25/2013   Procedure:  Lumbar four-five, Lumbar five-Sacral one Transforaminal Lumbar Interbody Fusion ;  Surgeon: Karn Cassis, MD;  Location: [facility];  Service: Neurosurgery;  Laterality: N/A;   Past Surgical History:  Procedure Laterality Date   BREAST BIOPSY     right   BREAST LUMPECTOMY Left 2014   CHOLECYSTECTOMY     "gangrene in small intestines clipped by surgeon" per patient   COLONOSCOPY  2009   Dr Linna Darner    cysts removed from ovary     DILATION AND CURETTAGE OF UTERUS     ELBOW SURGERY Left    ESOPHAGOGASTRODUODENOSCOPY     HEMATOMA EVACUATION N/A 08/25/2013   Procedure: EVACUATION OF LUMBAR HEMATOMA;  Surgeon: Karn Cassis, MD;  Location: [facility];  Service: Neurosurgery;  Laterality: N/A;   LUMBAR LAMINECTOMY/DECOMPRESSION MICRODISCECTOMY  12/03/2011   Procedure: LUMBAR LAMINECTOMY/DECOMPRESSION MICRODISCECTOMY 2 LEVELS;  Surgeon: Karn Cassis, MD;  [facility]: [facility];  Service: Neurosurgery;  Laterality: Right;  Right Lumbar four-five Lumbar five sacral one Foraminotomies   LUMBAR WOUND DEBRIDEMENT N/A 09/01/2013   Procedure: LUMBAR WOUND DEBRIDEMENT;  Surgeon: Karn Cassis, MD;  Location: [facility];  Service: Neurosurgery;  Laterality: N/A;   PLACEMENT OF LUMBAR DRAIN N/A 09/01/2013   Procedure: PLACEMENT OF LUMBAR DRAIN;  Surgeon: Karn Cassis, MD;  Location: [facility];  Service: Neurosurgery;  Laterality: N/A;   TRANSFORAMINAL LUMBAR INTERBODY FUSION (TLIF) WITH PEDICLE SCREW FIXATION 2 LEVEL N/A 08/25/2013   Procedure:  Lumbar four-five, Lumbar five-Sacral one Transforaminal Lumbar Interbody Fusion ;  Surgeon: Karn Cassis, MD;  Location: [facility];  Service: Neurosurgery;  Laterality: N/A;   Past Medical History:  Diagnosis Date   Anxiety    takes celexa daily   Arthritis    Asthma    Bronchitis    Chronic back pain    ddd/lumbago,and radiculopathy    COVID-17 Sep 2020   Depression    Gastric ulcer    GERD (gastroesophageal reflux disease)    takes Protonix daily   Gout    hx of   H/O hiatal hernia    Hx of colonic polyps    Hypertension    takes Losartan daily   Hypothyroidism    takes Levothyroxine daily   IBS (irritable bowel syndrome)    Nocturia    Pneumonia    hx of 15+yrs ago   PONV (postoperative nausea and vomiting)    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 Arta Silence   Ulcerative colitis    Urinary incontinence    There were no vitals taken for this visit.  Opioid Risk Score:   Fall Risk Score:  `1  Depression screen PHQ 2/9     05/12/2023    1:39 PM 01/09/2023    1:47 PM 11/12/2022    1:19 PM 05/13/2022   10:56 AM 03/12/2022   11:29 AM 01/14/2022   11:08 AM 11/19/2021   12:50 PM  Depression screen PHQ 2/9  Decreased Interest 3 3 3  0 2 3 3   Down, Depressed, Hopeless 3 3 3  0 2 3 3   PHQ - 2 Score 6 6 6  0 4 6 6     Review of Systems  Musculoskeletal:  Positive for back pain and gait problem.       Pain from both hips, buttocks and down both legs  All other systems reviewed and are negative.      Objective:   Physical Exam Vitals and nursing note reviewed.  Constitutional:      Appearance: Normal appearance.  Cardiovascular:     Rate and Rhythm: Normal rate and regular rhythm.     Pulses: Normal pulses.     Heart sounds: Normal heart sounds.  Pulmonary:     Effort: Pulmonary effort is normal.     Breath sounds: Normal breath sounds.  Musculoskeletal:     Cervical back: Normal range of motion and neck supple.     Comments: Normal Muscle Bulk and Muscle Testing Reveals:  Upper Extremities: Full ROM and Muscle Strength 5/5  Lumbar Paraspinal Tenderness: L-4-L-5 Greater Trochanter Tenderness: L>R Lower Extremities: Full ROM and Muscle Strength 5/5 Arises from Table slowly using cane for support Narrow Based  Gait     Skin:    General: Skin is warm and dry.  Neurological:      Mental Status: She is alert and oriented to person, place, and time.  Psychiatric:        Mood and Affect: Mood normal.        Behavior: Behavior normal.         Assessment & Plan:  1. Cauda equina syndrome: Incontinence: Wearing Depends:Continue to Monitor. 07/10/2023. 2. Insomnia: Continue with Nortriptyline. 07/10/2023 3. 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 Continue current medication regimen. 05/12/2023 Refilled:  Percocet 10 mg #120--use 1 tablet every 6 hours as needed and  MS Contin 30 mg one tablet every 12 hours as needed #60. Second scripts sent for the following month We will continue the opioid monitoring program, this consists of regular clinic visits, examinations, urine drug screen, pill counts as well as use of [place] Controlled Substance Reporting system. A 12 month History has been reviewed on the [place] Controlled Substance Reporting System on 07/10/2023. 4. Depression: Continue with Cymbalta. Continue to Monitor. 07/10/2023 5. Anxiety: Continue with current medication regimen with Xanax. Educated on the [employer]. Continue to Monitor. 07/10/2023 6.Neuropathy:. Continue:with current medication nregimen with  Pamelor. 07/10/2023 7. Constipation: Continue Senokot. Continue to Monitor. 07/10/2023 8.Muscle Spasm: Continue  Methocarbamol. .Continue to Monitor. 07/10/2023 9. Lumbar Radiculitis: Will discussed with Dr Riley Kill regarding Salvadore [facility], she verbalizes understanding. Continue Pamelor. Continue to Monitor. 07/10/2023 10. Bilateral Greater Trochanter Bursitis:  Continue to Alternate Ice and Heat Therapy . Continue HEP as Tolerated. Continue to Monitor. 07/10/2023   F/U in 2 months

## 2023-07-10 ENCOUNTER — Encounter: Payer: Medicare Other | Attending: Registered Nurse | Admitting: Registered Nurse

## 2023-07-10 ENCOUNTER — Encounter: Payer: Self-pay | Admitting: Registered Nurse

## 2023-07-10 VITALS — BP 145/83 | HR 75 | Ht 64.0 in | Wt 229.0 lb

## 2023-07-10 DIAGNOSIS — G894 Chronic pain syndrome: Secondary | ICD-10-CM | POA: Diagnosis not present

## 2023-07-10 DIAGNOSIS — Z5181 Encounter for therapeutic drug level monitoring: Secondary | ICD-10-CM | POA: Diagnosis not present

## 2023-07-10 DIAGNOSIS — M7062 Trochanteric bursitis, left hip: Secondary | ICD-10-CM | POA: Diagnosis not present

## 2023-07-10 DIAGNOSIS — M7061 Trochanteric bursitis, right hip: Secondary | ICD-10-CM | POA: Diagnosis not present

## 2023-07-10 DIAGNOSIS — F411 Generalized anxiety disorder: Secondary | ICD-10-CM | POA: Diagnosis present

## 2023-07-10 DIAGNOSIS — Z79891 Long term (current) use of opiate analgesic: Secondary | ICD-10-CM | POA: Diagnosis not present

## 2023-07-10 DIAGNOSIS — M5416 Radiculopathy, lumbar region: Secondary | ICD-10-CM | POA: Diagnosis not present

## 2023-07-10 DIAGNOSIS — G834 Cauda equina syndrome: Secondary | ICD-10-CM | POA: Insufficient documentation

## 2023-07-10 MED ORDER — MORPHINE SULFATE ER 30 MG PO TBCR: EXTENDED_RELEASE_TABLET | ORAL | 0 refills | Status: DC

## 2023-07-10 MED ORDER — OXYCODONE-ACETAMINOPHEN 10-325 MG PO TABS: 1.0000 | ORAL_TABLET | Freq: Four times a day (QID) | ORAL | 0 refills | Status: DC | PRN

## 2023-07-10 MED ORDER — ALPRAZOLAM 0.25 MG PO TABS: ORAL_TABLET | ORAL | 2 refills | Status: DC

## 2023-07-15 LAB — TOXASSURE SELECT,+ANTIDEPR,UR

## 2023-08-07 DIAGNOSIS — E039 Hypothyroidism, unspecified: Secondary | ICD-10-CM | POA: Diagnosis not present

## 2023-08-07 DIAGNOSIS — E785 Hyperlipidemia, unspecified: Secondary | ICD-10-CM | POA: Diagnosis not present

## 2023-08-07 DIAGNOSIS — E876 Hypokalemia: Secondary | ICD-10-CM | POA: Diagnosis not present

## 2023-08-07 DIAGNOSIS — E7849 Other hyperlipidemia: Secondary | ICD-10-CM | POA: Diagnosis not present

## 2023-08-14 DIAGNOSIS — J309 Allergic rhinitis, unspecified: Secondary | ICD-10-CM | POA: Diagnosis not present

## 2023-08-14 DIAGNOSIS — E039 Hypothyroidism, unspecified: Secondary | ICD-10-CM | POA: Diagnosis not present

## 2023-08-14 DIAGNOSIS — K219 Gastro-esophageal reflux disease without esophagitis: Secondary | ICD-10-CM | POA: Diagnosis not present

## 2023-08-14 DIAGNOSIS — K580 Irritable bowel syndrome with diarrhea: Secondary | ICD-10-CM | POA: Diagnosis not present

## 2023-08-14 DIAGNOSIS — H6990 Unspecified Eustachian tube disorder, unspecified ear: Secondary | ICD-10-CM | POA: Diagnosis not present

## 2023-08-14 DIAGNOSIS — I10 Essential (primary) hypertension: Secondary | ICD-10-CM | POA: Diagnosis not present

## 2023-08-14 DIAGNOSIS — Z23 Encounter for immunization: Secondary | ICD-10-CM | POA: Diagnosis not present

## 2023-08-14 DIAGNOSIS — R609 Edema, unspecified: Secondary | ICD-10-CM | POA: Diagnosis not present

## 2023-08-14 DIAGNOSIS — E785 Hyperlipidemia, unspecified: Secondary | ICD-10-CM | POA: Diagnosis not present

## 2023-09-09 ENCOUNTER — Encounter: Payer: Medicare Other | Attending: Registered Nurse | Admitting: Registered Nurse

## 2023-09-09 VITALS — BP 124/79 | HR 84 | Ht 64.0 in | Wt 231.0 lb

## 2023-09-09 DIAGNOSIS — G834 Cauda equina syndrome: Secondary | ICD-10-CM | POA: Insufficient documentation

## 2023-09-09 DIAGNOSIS — Z79891 Long term (current) use of opiate analgesic: Secondary | ICD-10-CM | POA: Insufficient documentation

## 2023-09-09 DIAGNOSIS — M7061 Trochanteric bursitis, right hip: Secondary | ICD-10-CM | POA: Diagnosis not present

## 2023-09-09 DIAGNOSIS — M5416 Radiculopathy, lumbar region: Secondary | ICD-10-CM | POA: Diagnosis not present

## 2023-09-09 DIAGNOSIS — G894 Chronic pain syndrome: Secondary | ICD-10-CM | POA: Insufficient documentation

## 2023-09-09 DIAGNOSIS — M7062 Trochanteric bursitis, left hip: Secondary | ICD-10-CM | POA: Diagnosis not present

## 2023-09-09 DIAGNOSIS — Z5181 Encounter for therapeutic drug level monitoring: Secondary | ICD-10-CM | POA: Insufficient documentation

## 2023-09-09 MED ORDER — NORTRIPTYLINE HCL 50 MG PO CAPS: ORAL_CAPSULE | ORAL | 3 refills | Status: DC

## 2023-09-09 MED ORDER — OXYCODONE-ACETAMINOPHEN 10-325 MG PO TABS: 1.0000 | ORAL_TABLET | Freq: Four times a day (QID) | ORAL | 0 refills | Status: DC | PRN

## 2023-09-09 MED ORDER — MORPHINE SULFATE ER 30 MG PO TBCR: EXTENDED_RELEASE_TABLET | ORAL | 0 refills | Status: DC

## 2023-09-09 MED ORDER — ALPRAZOLAM 0.25 MG PO TABS: ORAL_TABLET | ORAL | 2 refills | Status: DC

## 2023-09-09 NOTE — Progress Notes (Signed)
Subjective:    Patient ID: Sherry Wong, female    DOB: 1956-01-12, 67 y.o.   MRN: [id number]  HPI: Sherry Wong is a 67 y.o. female who returns for follow up appointment for chronic pain and medication refill. She states her pain is located in her lower back radiating into her bilateral lower extremities and she reports bilateral hip pain. She rates her pain 5. Her current exercise regime is walking and performing stretching exercises.  [name] equivalent is 120.00 MME. She is also prescribed Alprazolam  .We have discussed the black box warning of using opioids and benzodiazepines. I highlighted the dangers of using these drugs together and discussed the adverse events including respiratory suppression, overdose, cognitive impairment and importance of compliance with current regimen. We will continue to monitor and adjust as indicated.    Last UDS was Performed on 07/10/2023, it was consistent.     Pain Inventory Average Pain 6 Pain Right Now 5 My pain is sharp, burning, stabbing, tingling, and aching  In the last 24 hours, has pain interfered with the following? General activity 9 Relation with others 9 Enjoyment of life 9 What TIME of day is your pain at its worst? morning , daytime, and night Sleep (in general) Fair  Pain is worse with: walking, bending, standing, and some activites Pain improves with: rest, heat/ice, and medication Relief from Meds: 4  Family History  Problem Relation Age of Onset   Anesthesia problems Mother    COPD Mother    Diabetes Mother    Anesthesia problems Sister    Hypotension Neg Hx    Malignant hyperthermia Neg Hx    Pseudochol deficiency Neg Hx    Colon cancer Neg Hx    Esophageal cancer Neg Hx    Liver disease Neg Hx    Pancreatic cancer Neg Hx    Social History   Socioeconomic History   Marital status: Widowed    Spouse name: Not on file   Number of children: 0   Years of education: 2   Highest education  level: Not on file  Occupational History   Occupation: Disabled  Tobacco Use   Smoking status: Never   Smokeless tobacco: Never  Vaping Use   Vaping status: Never Used  Substance and Sexual Activity   Alcohol use: Yes    Alcohol/week: 0.0 standard drinks of alcohol    Comment: occ- 0.5 can beer every 2 months   Drug use: No   Sexual activity: Not Currently  Other Topics Concern   Not on file  Social History Narrative   Lives at home with husband.   Caffeine use: 1 cup coffee 3 times per week.    Social Determinants of Health   Financial Resource Strain: Not on file  Food Insecurity: Not on file  Transportation Needs: Not on file  Physical Activity: Not on file  Stress: Not on file  Social Connections: Not on file   Past Surgical History:  Procedure Laterality Date   BREAST BIOPSY     right   BREAST LUMPECTOMY Left 2014   CHOLECYSTECTOMY     "gangrene in small intestines clipped by surgeon" per patient   COLONOSCOPY  2009   Dr Linna Darner    cysts removed from ovary     DILATION AND CURETTAGE OF UTERUS     ELBOW SURGERY Left    ESOPHAGOGASTRODUODENOSCOPY     HEMATOMA EVACUATION N/A 08/25/2013   Procedure: EVACUATION OF LUMBAR HEMATOMA;  Surgeon:  Karn Cassis, MD;  Location: [facility];  Service: Neurosurgery;  Laterality: N/A;   LUMBAR LAMINECTOMY/DECOMPRESSION MICRODISCECTOMY  12/03/2011   Procedure: LUMBAR LAMINECTOMY/DECOMPRESSION MICRODISCECTOMY 2 LEVELS;  Surgeon: Karn Cassis, MD;  Location: [facility];  Service: Neurosurgery;  Laterality: Right;  Right Lumbar four-five Lumbar five sacral one Foraminotomies   LUMBAR WOUND DEBRIDEMENT N/A 09/01/2013   Procedure: LUMBAR WOUND DEBRIDEMENT;  Surgeon: Karn Cassis, MD;  Location: [facility];  Service: Neurosurgery;  Laterality: N/A;   PLACEMENT OF LUMBAR DRAIN N/A 09/01/2013   Procedure: PLACEMENT OF LUMBAR DRAIN;  Surgeon: Karn Cassis, MD;  Location: [facility];  Service: Neurosurgery;  Laterality:  N/A;   TRANSFORAMINAL LUMBAR INTERBODY FUSION (TLIF) WITH PEDICLE SCREW FIXATION 2 LEVEL N/A 08/25/2013   Procedure:  Lumbar four-five, Lumbar five-Sacral one Transforaminal Lumbar Interbody Fusion ;  Surgeon: Karn Cassis, MD;  Location: [facility];  Service: Neurosurgery;  Laterality: N/A;   Past Surgical History:  Procedure Laterality Date   BREAST BIOPSY     right   BREAST LUMPECTOMY Left 2014   CHOLECYSTECTOMY     "gangrene in small intestines clipped by surgeon" per patient   COLONOSCOPY  2009   Dr Linna Darner    cysts removed from ovary     DILATION AND CURETTAGE OF UTERUS     ELBOW SURGERY Left    ESOPHAGOGASTRODUODENOSCOPY     HEMATOMA EVACUATION N/A 08/25/2013   Procedure: EVACUATION OF LUMBAR HEMATOMA;  Surgeon: Karn Cassis, MD;  [facility]: [facility];  Service: Neurosurgery;  Laterality: N/A;   LUMBAR LAMINECTOMY/DECOMPRESSION MICRODISCECTOMY  12/03/2011   Procedure: LUMBAR LAMINECTOMY/DECOMPRESSION MICRODISCECTOMY 2 LEVELS;  Surgeon: Karn Cassis, MD;  [facility]: [facility];  Service: Neurosurgery;  Laterality: Right;  Right Lumbar four-five Lumbar five sacral one Foraminotomies   LUMBAR WOUND DEBRIDEMENT N/A 09/01/2013   Procedure: LUMBAR WOUND DEBRIDEMENT;  Surgeon: Karn Cassis, MD;  [facility]: [facility];  Service: Neurosurgery;  Laterality: N/A;   PLACEMENT OF LUMBAR DRAIN N/A 09/01/2013   Procedure: PLACEMENT OF LUMBAR DRAIN;  Surgeon: Karn Cassis, MD;  [facility]: [facility];  Service: Neurosurgery;  Laterality: N/A;   TRANSFORAMINAL LUMBAR INTERBODY FUSION (TLIF) WITH PEDICLE SCREW FIXATION 2 LEVEL N/A 08/25/2013   Procedure:  Lumbar four-five, Lumbar five-Sacral one Transforaminal Lumbar Interbody Fusion ;  Surgeon: Karn Cassis, MD;  Location: [facility];  Service: Neurosurgery;  Laterality: N/A;   Past Medical History:  Diagnosis Date   Anxiety    takes celexa daily   Arthritis    Asthma    Bronchitis    Chronic back pain     ddd/lumbago,and radiculopathy   COVID-17 Sep 2020   Depression    Gastric ulcer    GERD (gastroesophageal reflux disease)    takes Protonix daily   Gout    hx of   H/O hiatal hernia    Hx of colonic polyps    Hypertension    takes Losartan daily   Hypothyroidism    takes Levothyroxine daily   IBS (irritable bowel syndrome)    Nocturia    Pneumonia    hx of 15+yrs ago   PONV (postoperative nausea and vomiting)    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 Arta Silence   Ulcerative colitis    Urinary incontinence    BP 124/79   Pulse 84   Ht 5\' 4"  (1.626 m)   Wt 231 lb (  104.8 kg)   SpO2 95%   BMI 39.65 kg/m   Opioid Risk Score:   Fall Risk Score:  `1  Depression screen [facility] 2/9     07/10/2023   11:09 AM 05/12/2023    1:39 PM 01/09/2023    1:47 PM 11/12/2022    1:19 PM 05/13/2022   10:56 AM 03/12/2022   11:29 AM 01/14/2022   11:08 AM  Depression screen PHQ 2/9  Decreased Interest 0 3 3 3  0 2 3  Down, Depressed, Hopeless 0 3 3 3  0 2 3  PHQ - 2 Score 0 6 6 6  0 4 6     Review of Systems  Musculoskeletal:  Positive for back pain.       Bilateral leg pain  All other systems reviewed and are negative.     Objective:   Physical Exam Vitals and nursing note reviewed.  Constitutional:      Appearance: Normal appearance. She is obese.  Cardiovascular:     Rate and Rhythm: Normal rate and regular rhythm.     Pulses: Normal pulses.     Heart sounds: Normal heart sounds.  Pulmonary:     Effort: Pulmonary effort is normal.     Breath sounds: Normal breath sounds.  Musculoskeletal:     Comments: Normal Muscle Bulk and Muscle Testing Reveals:  Upper Extremities: Full ROM and Muscle Strength 5/5  Lumbar Paraspinal Tenderness: L-3-L-5 Bilateral Greater Trochanter Tenderness Lower Extremities: Full ROM and Muscle Strength 5/5 Arises from chair slowly using cane for support Antalgic  Gait     Skin:    General: Skin is warm and dry.  Neurological:      Mental Status: She is alert and oriented to person, place, and time.  Psychiatric:        Mood and Affect: Mood normal.        Behavior: Behavior normal.         Assessment & Plan:  1. Cauda equina syndrome: Incontinence: Wearing Depends:Continue to Monitor. 09/09/2023. 2. Insomnia: Continue with Nortriptyline. 09/09/2023 3. 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 Continue current medication regimen. 09/09/2023 Refilled:  Percocet 10 mg #120--use 1 tablet every 6 hours as needed and  MS Contin 30 mg one tablet every 12 hours as needed #60. Second scripts sent for the following month We will continue the opioid monitoring program, this consists of regular clinic visits, examinations, urine drug screen, pill counts as well as use of [place] Controlled Substance Reporting system. A 12 month History has been reviewed on the [place] Controlled Substance Reporting System on 09/09/2023. 4. Depression: Continue with Cymbalta. Continue to Monitor. 09/09/2023 5. Anxiety: Continue with current medication regimen with Xanax. Educated on the [employer]. Continue to Monitor. 09/09/2023 6.Neuropathy:. Continue:with current medication nregimen with  Pamelor. 09/09/2023 7. Constipation: Continue Senokot. Continue to Monitor. 09/09/2023 8.Muscle Spasm: Continue  Methocarbamol. .Continue to Monitor. 09/09/2023 9. Lumbar Radiculitis: Continue Pamelor. Continue to Monitor. 09/09/2023 10. Bilateral Greater Trochanter Bursitis:  Continue to Alternate Ice and Heat Therapy . Continue HEP as Tolerated. Continue to Monitor. 09/09/2023   F/U in 2 months

## 2023-09-12 ENCOUNTER — Encounter: Payer: Self-pay | Admitting: Registered Nurse

## 2023-11-04 ENCOUNTER — Other Ambulatory Visit: Payer: Self-pay | Admitting: Registered Nurse

## 2023-11-04 ENCOUNTER — Encounter: Payer: Medicare Other | Attending: Registered Nurse | Admitting: Registered Nurse

## 2023-11-04 ENCOUNTER — Encounter: Payer: Medicare Other | Admitting: Registered Nurse

## 2023-11-04 ENCOUNTER — Encounter: Payer: Self-pay | Admitting: Registered Nurse

## 2023-11-04 VITALS — BP 143/85 | HR 86 | Temp 98.5°F | Ht 66.0 in | Wt 233.6 lb

## 2023-11-04 DIAGNOSIS — M7061 Trochanteric bursitis, right hip: Secondary | ICD-10-CM | POA: Diagnosis not present

## 2023-11-04 DIAGNOSIS — M7062 Trochanteric bursitis, left hip: Secondary | ICD-10-CM | POA: Insufficient documentation

## 2023-11-04 DIAGNOSIS — Z5181 Encounter for therapeutic drug level monitoring: Secondary | ICD-10-CM | POA: Insufficient documentation

## 2023-11-04 DIAGNOSIS — F411 Generalized anxiety disorder: Secondary | ICD-10-CM | POA: Insufficient documentation

## 2023-11-04 DIAGNOSIS — H01001 Unspecified blepharitis right upper eyelid: Secondary | ICD-10-CM | POA: Diagnosis not present

## 2023-11-04 DIAGNOSIS — G834 Cauda equina syndrome: Secondary | ICD-10-CM | POA: Diagnosis not present

## 2023-11-04 DIAGNOSIS — H01004 Unspecified blepharitis left upper eyelid: Secondary | ICD-10-CM | POA: Diagnosis not present

## 2023-11-04 DIAGNOSIS — G894 Chronic pain syndrome: Secondary | ICD-10-CM | POA: Diagnosis not present

## 2023-11-04 DIAGNOSIS — M5416 Radiculopathy, lumbar region: Secondary | ICD-10-CM | POA: Diagnosis not present

## 2023-11-04 DIAGNOSIS — Z79891 Long term (current) use of opiate analgesic: Secondary | ICD-10-CM | POA: Insufficient documentation

## 2023-11-04 MED ORDER — MORPHINE SULFATE ER 30 MG PO TBCR: EXTENDED_RELEASE_TABLET | ORAL | 0 refills | Status: DC

## 2023-11-04 MED ORDER — OXYCODONE-ACETAMINOPHEN 10-325 MG PO TABS: 1.0000 | ORAL_TABLET | Freq: Four times a day (QID) | ORAL | 0 refills | Status: DC | PRN

## 2023-11-04 MED ORDER — ALPRAZOLAM 0.25 MG PO TABS: ORAL_TABLET | ORAL | 2 refills | Status: DC

## 2023-11-04 NOTE — Progress Notes (Signed)
 Subjective:    Patient ID: Sherry Wong, female    DOB: 04/26/1956, 68 y.o.   MRN: [id number]  HPI: Sherry Wong is a 68 y.o. female who returns for follow up appointment for chronic pain and medication refill. She states her pain is located in her lower back radiating into her buttocks and bilateral lower extremities. She also reports bilateral hip pain and bilateral feet pain. She rates her pain 7. Her current exercise regime is walking and performing stretching exercises.  Sherry Wong arrived to office with Bilateral Blepharitis, she states this began on 11/01/2023, she refuses ED r Urgent Care evaluation. She states she will call her PCP to scheduled an appointment.   Sherry Wong Morphine  equivalent is 120 MME. She is also prescribed Alprazolam   .We have discussed the black box warning of using opioids and benzodiazepines. I highlighted the dangers of using these drugs together and discussed the adverse events including respiratory suppression, overdose, cognitive impairment and importance of compliance with current regimen. We will continue to monitor and adjust as indicated.   UDS ordered today.     Pain Inventory Average Pain 7 Pain Right Now 7 My pain is sharp, burning, dull, stabbing, tingling, and aching  In the last 24 hours, has pain interfered with the following? General activity 9 Relation with others 9 Enjoyment of life 9 What TIME of day is your pain at its worst? morning , daytime, evening, and night Sleep (in general) Fair  Pain is worse with: walking, bending, standing, and some activites Pain improves with: rest, heat/ice, pacing activities, and medication Relief from Meds: 5  Family History  Problem Relation Age of Onset   Anesthesia problems Mother    COPD Mother    Diabetes Mother    Anesthesia problems Sister    Hypotension Neg Hx    Malignant hyperthermia Neg Hx    Pseudochol deficiency Neg Hx    Colon cancer Neg Hx    Esophageal cancer  Neg Hx    Liver disease Neg Hx    Pancreatic cancer Neg Hx    Social History   Socioeconomic History   Marital status: Widowed    Spouse name: Not on file   Number of children: 0   Years of education: 91   Highest education level: Not on file  Occupational History   Occupation: Disabled  Tobacco Use   Smoking status: Never   Smokeless tobacco: Never  Vaping Use   Vaping status: Never Used  Substance and Sexual Activity   Alcohol  use: Yes    Alcohol /week: 0.0 standard drinks of alcohol     Comment: occ- 0.5 can beer every 2 months   Drug use: No   Sexual activity: Not Currently  Other Topics Concern   Not on file  Social History Narrative   Lives at home with husband.   Caffeine use: 1 cup coffee 3 times per week.    Social Drivers of Corporate Investment Banker Strain: Not on file  Food Insecurity: Not on file  Transportation Needs: Not on file  Physical Activity: Not on file  Stress: Not on file  Social Connections: Not on file   Past Surgical History:  Procedure Laterality Date   BREAST BIOPSY     right   BREAST LUMPECTOMY Left 2014   CHOLECYSTECTOMY     gangrene in small intestines clipped by surgeon per patient   COLONOSCOPY  2009   Dr Jeryl    cysts removed from  ovary     DILATION AND CURETTAGE OF UTERUS     ELBOW SURGERY Left    ESOPHAGOGASTRODUODENOSCOPY     HEMATOMA EVACUATION N/A 08/25/2013   Procedure: EVACUATION OF LUMBAR HEMATOMA;  Surgeon: Catalina CHRISTELLA Stains, MD;  Location: [facility];  Service: Neurosurgery;  Laterality: N/A;   LUMBAR LAMINECTOMY/DECOMPRESSION MICRODISCECTOMY  12/03/2011   Procedure: LUMBAR LAMINECTOMY/DECOMPRESSION MICRODISCECTOMY 2 LEVELS;  Surgeon: Catalina CHRISTELLA Stains, MD;  Location: [facility];  Service: Neurosurgery;  Laterality: Right;  Right Lumbar four-five Lumbar five sacral one Foraminotomies   LUMBAR WOUND DEBRIDEMENT N/A 09/01/2013   Procedure: LUMBAR WOUND DEBRIDEMENT;  Surgeon: Catalina CHRISTELLA Stains, MD;  [facility];  Service: Neurosurgery;  Laterality: N/A;   PLACEMENT OF LUMBAR DRAIN N/A 09/01/2013   Procedure: PLACEMENT OF LUMBAR DRAIN;  Surgeon: Catalina CHRISTELLA Stains, MD;  [facility]: [facility];  Service: Neurosurgery;  Laterality: N/A;   TRANSFORAMINAL LUMBAR INTERBODY FUSION (TLIF) WITH PEDICLE SCREW FIXATION 2 LEVEL N/A 08/25/2013   Procedure:  Lumbar four-five, Lumbar five-Sacral one Transforaminal Lumbar Interbody Fusion ;  Surgeon: Catalina CHRISTELLA Stains, MD;  [facility]: [facility];  Service: Neurosurgery;  Laterality: N/A;   Past Surgical History:  Procedure Laterality Date   BREAST BIOPSY     right   BREAST LUMPECTOMY Left 2014   CHOLECYSTECTOMY     gangrene in small intestines clipped by surgeon per patient   COLONOSCOPY  2009   Dr Jeryl    cysts removed from ovary     DILATION AND CURETTAGE OF UTERUS     ELBOW SURGERY Left    ESOPHAGOGASTRODUODENOSCOPY     HEMATOMA EVACUATION N/A 08/25/2013   Procedure: EVACUATION OF LUMBAR HEMATOMA;  Surgeon: Catalina CHRISTELLA Stains, MD;  [facility]: [facility];  Service: Neurosurgery;  Laterality: N/A;   LUMBAR LAMINECTOMY/DECOMPRESSION MICRODISCECTOMY  12/03/2011   Procedure: LUMBAR LAMINECTOMY/DECOMPRESSION MICRODISCECTOMY 2 LEVELS;  Surgeon: Catalina CHRISTELLA Stains, MD;  [facility];  Service: Neurosurgery;  Laterality: Right;  Right Lumbar four-five Lumbar five sacral one Foraminotomies   LUMBAR WOUND DEBRIDEMENT N/A 09/01/2013   Procedure: LUMBAR WOUND DEBRIDEMENT;  Surgeon: Catalina CHRISTELLA Stains, MD;  [facility]: [facility];  Service: Neurosurgery;  Laterality: N/A;   PLACEMENT OF LUMBAR DRAIN N/A 09/01/2013   Procedure: PLACEMENT OF LUMBAR DRAIN;  Surgeon: Catalina CHRISTELLA Stains, MD;  [facility];  Service: Neurosurgery;  Laterality: N/A;   TRANSFORAMINAL LUMBAR INTERBODY FUSION (TLIF) WITH PEDICLE SCREW FIXATION 2 LEVEL N/A 08/25/2013   Procedure:  Lumbar four-five, Lumbar five-Sacral one Transforaminal Lumbar Interbody Fusion ;  Surgeon: Catalina CHRISTELLA Stains, MD;  Location: [facility];  Service: Neurosurgery;  Laterality: N/A;   Past Medical History:  Diagnosis Date   Anxiety    takes celexa  daily   Arthritis    Asthma    Bronchitis    Chronic back pain    ddd/lumbago,and radiculopathy   COVID-17 Sep 2020   Depression    Gastric ulcer    GERD (gastroesophageal reflux disease)    takes Protonix  daily   Gout    hx of   H/O hiatal hernia    Hx of colonic polyps    Hypertension    takes Losartan  daily   Hypothyroidism    takes Levothyroxine  daily   IBS (irritable bowel syndrome)    Nocturia    Pneumonia    hx of 15+yrs ago   PONV (postoperative nausea and vomiting)    Ulcerative (  chronic) enterocolitis (HCC) 07/2020   Dr. Philippa   Ulcerative colitis    Urinary incontinence    There were no vitals taken for this visit.  Opioid Risk Score:   Fall Risk Score:  `1  Depression screen [facility] 2/9     07/10/2023   11:09 AM 05/12/2023    1:39 PM 01/09/2023    1:47 PM 11/12/2022    1:19 PM 05/13/2022   10:56 AM 03/12/2022   11:29 AM 01/14/2022   11:08 AM  Depression screen PHQ 2/9  Decreased Interest 0 3 3 3  0 2 3  Down, Depressed, Hopeless 0 3 3 3  0 2 3  PHQ - 2 Score 0 6 6 6  0 4 6     Review of Systems     Objective:   Physical Exam Vitals and nursing note reviewed.  Constitutional:      Appearance: Normal appearance.  Eyes:     Comments: + Bilateral Eye lid swelling.   Cardiovascular:     Rate and Rhythm: Normal rate and regular rhythm.     Pulses: Normal pulses.     Heart sounds: Normal heart sounds.  Pulmonary:     Effort: Pulmonary effort is normal.     Breath sounds: Normal breath sounds.  Musculoskeletal:     Comments: Normal Muscle Bulk and Muscle Testing Reveals:  Upper Extremities: Full ROM and Muscle Strength 5/5 ,Lumbar Hypersensitivity Bilateral Greater Trochanter Tenderness Lower Extremities: Full ROM and Muscle Strength 5/5 Arises from Chair slowly using cane for support Antalgic   Gait     Skin:    General: Skin is warm and dry.  Neurological:     Mental Status: She is alert and oriented to person, place, and time.  Psychiatric:        Mood and Affect: Mood normal.        Behavior: Behavior normal.         Assessment & Plan:  1. Cauda equina syndrome: Incontinence: Wearing Depends:Continue to Monitor. 11/04/2023. 2. Insomnia: Continue with Nortriptyline . 11/04/2023 3. 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 Continue current medication regimen. 11/04/2023 Refilled:  Percocet 10 mg #120--use 1 tablet every 6 hours as needed and  MS Contin  30 mg one tablet every 12 hours as needed #60. Second scripts sent for the following month We will continue the opioid monitoring program, this consists of regular clinic visits, examinations, urine drug screen, pill counts as well as use of [place]  Controlled Substance Reporting system. A 12 month History has been reviewed on the [place]  Controlled Substance Reporting System on 11/04/2023. 4. Depression: Continue with Cymbalta. Continue to Monitor. 11/04/2023 5. Anxiety: Continue with current medication regimen with Xanax . Educated on the [employer]. Continue to Monitor. 11/04/2023 6.Neuropathy:. Continue:with current medication nregimen with  Pamelor . 11/04/2023 7. Constipation: Continue Senokot. Continue to Monitor. 11/04/2023 8.Muscle Spasm: Continue  Methocarbamol . .Continue to Monitor. 11/04/2023 9. Lumbar Radiculitis: Continue Pamelor . Continue to Monitor. 11/04/2023 10. Bilateral Greater Trochanter Bursitis:  Continue to Alternate Ice and Heat Therapy . Continue HEP as Tolerated. Continue to Monitor. 11/04/2023 11. Bilateral Blepharitis: She refuses ED or Urgent Care Evaluation. She states she will call her PCP and scheduled appointment. Continue to Monitor.    F/U in 2 months

## 2023-11-07 LAB — TOXASSURE SELECT,+ANTIDEPR,UR

## 2023-11-14 DIAGNOSIS — K219 Gastro-esophageal reflux disease without esophagitis: Secondary | ICD-10-CM | POA: Diagnosis not present

## 2023-11-14 DIAGNOSIS — E7849 Other hyperlipidemia: Secondary | ICD-10-CM | POA: Diagnosis not present

## 2023-11-14 DIAGNOSIS — R7989 Other specified abnormal findings of blood chemistry: Secondary | ICD-10-CM | POA: Diagnosis not present

## 2023-11-14 DIAGNOSIS — I10 Essential (primary) hypertension: Secondary | ICD-10-CM | POA: Diagnosis not present

## 2023-11-14 DIAGNOSIS — Z1329 Encounter for screening for other suspected endocrine disorder: Secondary | ICD-10-CM | POA: Diagnosis not present

## 2023-11-18 DIAGNOSIS — M545 Low back pain, unspecified: Secondary | ICD-10-CM | POA: Diagnosis not present

## 2023-11-18 DIAGNOSIS — E785 Hyperlipidemia, unspecified: Secondary | ICD-10-CM | POA: Diagnosis not present

## 2023-11-18 DIAGNOSIS — I10 Essential (primary) hypertension: Secondary | ICD-10-CM | POA: Diagnosis not present

## 2023-12-30 ENCOUNTER — Other Ambulatory Visit: Payer: Self-pay | Admitting: Registered Nurse

## 2023-12-30 DIAGNOSIS — E039 Hypothyroidism, unspecified: Secondary | ICD-10-CM | POA: Diagnosis not present

## 2023-12-30 DIAGNOSIS — Z1231 Encounter for screening mammogram for malignant neoplasm of breast: Secondary | ICD-10-CM | POA: Diagnosis not present

## 2023-12-30 DIAGNOSIS — G834 Cauda equina syndrome: Secondary | ICD-10-CM

## 2024-01-01 NOTE — Progress Notes (Unsigned)
 Subjective:    Patient ID: Sherry Wong, female    DOB: 08-12-1956, 68 y.o.   MRN: [id number]  HPI: Sherry Wong is a 68 y.o. female who returns for follow up appointment for chronic pain and medication refill. She states her pain is located in her lower back radiating into her bilateral lower extremities and bilateral feet with tingling and burning. She rates her pain 6. Her current exercise regime is walking and performing stretching exercises.  Sherry Wong Morphine equivalent is 120.00 MME.  Sherry Wong is also prescribed Alprazolam  .We have discussed the black box warning of using opioids and benzodiazepines. I highlighted the dangers of using these drugs together and discussed the adverse events including respiratory suppression, overdose, cognitive impairment and importance of compliance with current regimen. We will continue to monitor and adjust as indicated.    Last UDS was Performed on 11/03/2022, it was consistent.      Pain Inventory Average Pain 7 Pain Right Now 6 My pain is sharp, burning, dull, stabbing, tingling, and aching  In the last 24 hours, has pain interfered with the following? General activity 9 Relation with others 9 Enjoyment of life 9 What TIME of day is your pain at its worst? morning , daytime, evening, and night Sleep (in general) Fair  Pain is worse with: walking, bending, standing, and some activites Pain improves with: rest and medication Relief from Meds: 5  Family History  Problem Relation Age of Onset   Anesthesia problems Mother    COPD Mother    Diabetes Mother    Anesthesia problems Sister    Hypotension Neg Hx    Malignant hyperthermia Neg Hx    Pseudochol deficiency Neg Hx    Colon cancer Neg Hx    Esophageal cancer Neg Hx    Liver disease Neg Hx    Pancreatic cancer Neg Hx    Social History   Socioeconomic History   Marital status: Widowed    Spouse name: Not on file   Number of children: 0   Years of education: 74    Highest education level: Not on file  Occupational History   Occupation: Disabled  Tobacco Use   Smoking status: Never   Smokeless tobacco: Never  Vaping Use   Vaping status: Never Used  Substance and Sexual Activity   Alcohol use: Yes    Alcohol/week: 0.0 standard drinks of alcohol    Comment: occ- 0.5 can beer every 2 months   Drug use: No   Sexual activity: Not Currently  Other Topics Concern   Not on file  Social History Narrative   Lives at home with husband.   Caffeine use: 1 cup coffee 3 times per week.    Social Drivers of Corporate investment banker Strain: Not on file  Food Insecurity: Not on file  Transportation Needs: Not on file  Physical Activity: Not on file  Stress: Not on file  Social Connections: Not on file   Past Surgical History:  Procedure Laterality Date   BREAST BIOPSY     right   BREAST LUMPECTOMY Left 2014   CHOLECYSTECTOMY     "gangrene in small intestines clipped by surgeon" per patient   COLONOSCOPY  2009   Dr Linna Darner    cysts removed from ovary     DILATION AND CURETTAGE OF UTERUS     ELBOW SURGERY Left    ESOPHAGOGASTRODUODENOSCOPY     HEMATOMA EVACUATION N/A 08/25/2013   Procedure:  EVACUATION OF LUMBAR HEMATOMA;  Surgeon: Karn Cassis, MD;  Location: [facility];  Service: Neurosurgery;  Laterality: N/A;   LUMBAR LAMINECTOMY/DECOMPRESSION MICRODISCECTOMY  12/03/2011   Procedure: LUMBAR LAMINECTOMY/DECOMPRESSION MICRODISCECTOMY 2 LEVELS;  Surgeon: Karn Cassis, MD;  Location: [facility];  Service: Neurosurgery;  Laterality: Right;  Right Lumbar four-five Lumbar five sacral one Foraminotomies   LUMBAR WOUND DEBRIDEMENT N/A 09/01/2013   Procedure: LUMBAR WOUND DEBRIDEMENT;  Surgeon: Karn Cassis, MD;  Location: [facility];  Service: Neurosurgery;  Laterality: N/A;   PLACEMENT OF LUMBAR DRAIN N/A 09/01/2013   Procedure: PLACEMENT OF LUMBAR DRAIN;  Surgeon: Karn Cassis, MD;  Location: [facility];  Service: Neurosurgery;   Laterality: N/A;   TRANSFORAMINAL LUMBAR INTERBODY FUSION (TLIF) WITH PEDICLE SCREW FIXATION 2 LEVEL N/A 08/25/2013   Procedure:  Lumbar four-five, Lumbar five-Sacral one Transforaminal Lumbar Interbody Fusion ;  Surgeon: Karn Cassis, MD;  Location: [facility];  Service: Neurosurgery;  Laterality: N/A;   Past Surgical History:  Procedure Laterality Date   BREAST BIOPSY     right   BREAST LUMPECTOMY Left 2014   CHOLECYSTECTOMY     "gangrene in small intestines clipped by surgeon" per patient   COLONOSCOPY  2009   Dr Linna Darner    cysts removed from ovary     DILATION AND CURETTAGE OF UTERUS     ELBOW SURGERY Left    ESOPHAGOGASTRODUODENOSCOPY     HEMATOMA EVACUATION N/A 08/25/2013   Procedure: EVACUATION OF LUMBAR HEMATOMA;  Surgeon: Karn Cassis, MD;  [facility]: [facility];  Service: Neurosurgery;  Laterality: N/A;   LUMBAR LAMINECTOMY/DECOMPRESSION MICRODISCECTOMY  12/03/2011   Procedure: LUMBAR LAMINECTOMY/DECOMPRESSION MICRODISCECTOMY 2 LEVELS;  Surgeon: Karn Cassis, MD;  [facility]: [facility];  Service: Neurosurgery;  Laterality: Right;  Right Lumbar four-five Lumbar five sacral one Foraminotomies   LUMBAR WOUND DEBRIDEMENT N/A 09/01/2013   Procedure: LUMBAR WOUND DEBRIDEMENT;  Surgeon: Karn Cassis, MD;  [facility]: [facility];  Service: Neurosurgery;  Laterality: N/A;   PLACEMENT OF LUMBAR DRAIN N/A 09/01/2013   Procedure: PLACEMENT OF LUMBAR DRAIN;  Surgeon: Karn Cassis, MD;  [facility]: [facility];  Service: Neurosurgery;  Laterality: N/A;   TRANSFORAMINAL LUMBAR INTERBODY FUSION (TLIF) WITH PEDICLE SCREW FIXATION 2 LEVEL N/A 08/25/2013   Procedure:  Lumbar four-five, Lumbar five-Sacral one Transforaminal Lumbar Interbody Fusion ;  Surgeon: Karn Cassis, MD;  Location: [facility];  Service: Neurosurgery;  Laterality: N/A;   Past Medical History:  Diagnosis Date   Anxiety    takes celexa daily   Arthritis    Asthma    Bronchitis    Chronic back pain     ddd/lumbago,and radiculopathy   COVID-17 Sep 2020   Depression    Gastric ulcer    GERD (gastroesophageal reflux disease)    takes Protonix daily   Gout    hx of   H/O hiatal hernia    Hx of colonic polyps    Hypertension    takes Losartan daily   Hypothyroidism    takes Levothyroxine daily   IBS (irritable bowel syndrome)    Nocturia    Pneumonia    hx of 15+yrs ago   PONV (postoperative nausea and vomiting)    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 Arta Silence   Ulcerative colitis    Urinary incontinence    There were no vitals taken for this visit.  Opioid Risk Score:  Fall Risk Score:  `1  Depression screen [facility] 2/9     07/10/2023   11:09 AM 05/12/2023    1:39 PM 01/09/2023    1:47 PM 11/12/2022    1:19 PM 05/13/2022   10:56 AM 03/12/2022   11:29 AM 01/14/2022   11:08 AM  Depression screen PHQ 2/9  Decreased Interest 0 3 3 3  0 2 3  Down, Depressed, Hopeless 0 3 3 3  0 2 3  PHQ - 2 Score 0 6 6 6  0 4 6    Review of Systems  Musculoskeletal:        Bilateral leg pain  All other systems reviewed and are negative.     Objective:   Physical Exam Vitals and nursing note reviewed.  Constitutional:      Appearance: Normal appearance. She is obese.  Cardiovascular:     Rate and Rhythm: Normal rate and regular rhythm.     Pulses: Normal pulses.     Heart sounds: Normal heart sounds.  Pulmonary:     Effort: Pulmonary effort is normal.     Breath sounds: Normal breath sounds.  Musculoskeletal:     Comments: Normal Muscle Bulk and Muscle Testing Reveals:  Upper Extremities: Full ROM and Muscle Strength 5/5 Lumbar Paraspinal Tenderness: L-4-L-5 Lower Extremities: Full ROM and Muscle Strength 5/5 Arises from Chair slowly using Cane for support Narrow Based  Gait     Skin:    General: Skin is warm and dry.  Neurological:     Mental Status: She is alert and oriented to person, place, and time.  Psychiatric:        Mood and Affect: Mood  normal.        Behavior: Behavior normal.         Assessment & Plan:  1. Cauda equina syndrome: Incontinence: Wearing Depends:Continue to Monitor. 01/02/2024. 2. Insomnia: Continue with Nortriptyline. 01/02/2024 3. 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 Continue current medication regimen. 01/02/2024 Refilled:  Percocet 10 mg #120--use 1 tablet every 6 hours as needed and  MS Contin 30 mg one tablet every 12 hours as needed #60. Second scripts sent for the following month We will continue the opioid monitoring program, this consists of regular clinic visits, examinations, urine drug screen, pill counts as well as use of [place] Controlled Substance Reporting system. A 12 month History has been reviewed on the [place] Controlled Substance Reporting System on 01/02/2024. 4. Depression: Continue with Cymbalta. Continue to Monitor. 01/02/2024 5. Anxiety: Continue with current medication regimen with Xanax. Educated on the [employer]. Continue to Monitor. 01/02/2024 6.Neuropathy:. Continue:with current medication nregimen with  Pamelor. 01/02/2024 7. Constipation: Continue Senokot. Continue to Monitor. 01/02/2024 8.Muscle Spasm: Continue  Methocarbamol. .Continue to Monitor. 01/02/2024 9. Lumbar Radiculitis: Continue Pamelor. Continue to Monitor. 01/02/2024 10. Bilateral Greater Trochanter Bursitis:  Continue to Alternate Ice and Heat Therapy . Continue HEP as Tolerated. Continue to Monitor. 01/02/2024    F/U in 2 months

## 2024-01-02 ENCOUNTER — Encounter: Payer: Self-pay | Admitting: Registered Nurse

## 2024-01-02 ENCOUNTER — Encounter: Payer: Medicare Other | Attending: Registered Nurse | Admitting: Registered Nurse

## 2024-01-02 VITALS — BP 139/88 | HR 82 | Ht 66.0 in | Wt 227.0 lb

## 2024-01-02 DIAGNOSIS — M7062 Trochanteric bursitis, left hip: Secondary | ICD-10-CM | POA: Diagnosis not present

## 2024-01-02 DIAGNOSIS — M7061 Trochanteric bursitis, right hip: Secondary | ICD-10-CM | POA: Diagnosis not present

## 2024-01-02 DIAGNOSIS — F411 Generalized anxiety disorder: Secondary | ICD-10-CM | POA: Insufficient documentation

## 2024-01-02 DIAGNOSIS — Z79891 Long term (current) use of opiate analgesic: Secondary | ICD-10-CM | POA: Insufficient documentation

## 2024-01-02 DIAGNOSIS — G834 Cauda equina syndrome: Secondary | ICD-10-CM | POA: Diagnosis not present

## 2024-01-02 DIAGNOSIS — Z5181 Encounter for therapeutic drug level monitoring: Secondary | ICD-10-CM | POA: Diagnosis not present

## 2024-01-02 DIAGNOSIS — M5416 Radiculopathy, lumbar region: Secondary | ICD-10-CM | POA: Diagnosis not present

## 2024-01-02 DIAGNOSIS — G894 Chronic pain syndrome: Secondary | ICD-10-CM | POA: Diagnosis not present

## 2024-01-02 MED ORDER — OXYCODONE-ACETAMINOPHEN 10-325 MG PO TABS: 1.0000 | ORAL_TABLET | Freq: Four times a day (QID) | ORAL | 0 refills | Status: DC | PRN

## 2024-01-02 MED ORDER — MORPHINE SULFATE ER 30 MG PO TBCR
EXTENDED_RELEASE_TABLET | ORAL | 0 refills | Status: DC
Start: 2024-01-02 — End: 2024-01-02

## 2024-01-02 MED ORDER — MORPHINE SULFATE ER 30 MG PO TBCR: EXTENDED_RELEASE_TABLET | ORAL | 0 refills | Status: DC

## 2024-01-02 MED ORDER — NORTRIPTYLINE HCL 50 MG PO CAPS: ORAL_CAPSULE | ORAL | 3 refills | Status: DC

## 2024-02-10 DIAGNOSIS — R7989 Other specified abnormal findings of blood chemistry: Secondary | ICD-10-CM | POA: Diagnosis not present

## 2024-02-10 DIAGNOSIS — Z1329 Encounter for screening for other suspected endocrine disorder: Secondary | ICD-10-CM | POA: Diagnosis not present

## 2024-02-10 DIAGNOSIS — Z131 Encounter for screening for diabetes mellitus: Secondary | ICD-10-CM | POA: Diagnosis not present

## 2024-02-10 DIAGNOSIS — R5383 Other fatigue: Secondary | ICD-10-CM | POA: Diagnosis not present

## 2024-02-10 DIAGNOSIS — E782 Mixed hyperlipidemia: Secondary | ICD-10-CM | POA: Diagnosis not present

## 2024-02-19 DIAGNOSIS — M545 Low back pain, unspecified: Secondary | ICD-10-CM | POA: Diagnosis not present

## 2024-02-19 DIAGNOSIS — E785 Hyperlipidemia, unspecified: Secondary | ICD-10-CM | POA: Diagnosis not present

## 2024-02-19 DIAGNOSIS — I10 Essential (primary) hypertension: Secondary | ICD-10-CM | POA: Diagnosis not present

## 2024-03-01 ENCOUNTER — Encounter: Attending: Registered Nurse | Admitting: Registered Nurse

## 2024-03-01 ENCOUNTER — Encounter: Payer: Self-pay | Admitting: Registered Nurse

## 2024-03-01 VITALS — BP 148/96 | HR 83 | Ht 66.0 in | Wt 227.0 lb

## 2024-03-01 DIAGNOSIS — F411 Generalized anxiety disorder: Secondary | ICD-10-CM | POA: Diagnosis present

## 2024-03-01 DIAGNOSIS — G834 Cauda equina syndrome: Secondary | ICD-10-CM | POA: Diagnosis not present

## 2024-03-01 DIAGNOSIS — M7062 Trochanteric bursitis, left hip: Secondary | ICD-10-CM | POA: Diagnosis not present

## 2024-03-01 DIAGNOSIS — M5416 Radiculopathy, lumbar region: Secondary | ICD-10-CM | POA: Diagnosis not present

## 2024-03-01 DIAGNOSIS — Z5181 Encounter for therapeutic drug level monitoring: Secondary | ICD-10-CM | POA: Diagnosis not present

## 2024-03-01 DIAGNOSIS — M7061 Trochanteric bursitis, right hip: Secondary | ICD-10-CM

## 2024-03-01 DIAGNOSIS — G894 Chronic pain syndrome: Secondary | ICD-10-CM | POA: Diagnosis not present

## 2024-03-01 DIAGNOSIS — Z79891 Long term (current) use of opiate analgesic: Secondary | ICD-10-CM

## 2024-03-01 MED ORDER — OXYCODONE-ACETAMINOPHEN 10-325 MG PO TABS: 1.0000 | ORAL_TABLET | Freq: Four times a day (QID) | ORAL | 0 refills | Status: DC | PRN

## 2024-03-01 MED ORDER — MORPHINE SULFATE ER 30 MG PO TBCR
EXTENDED_RELEASE_TABLET | ORAL | 0 refills | Status: DC
Start: 2024-03-01 — End: 2024-03-01

## 2024-03-01 MED ORDER — ALPRAZOLAM 0.25 MG PO TABS: ORAL_TABLET | ORAL | 2 refills | Status: DC

## 2024-03-01 MED ORDER — MORPHINE SULFATE ER 30 MG PO TBCR
EXTENDED_RELEASE_TABLET | ORAL | 0 refills | Status: DC
Start: 2024-03-01 — End: 2024-04-13

## 2024-03-01 NOTE — Progress Notes (Signed)
 Subjective:    Patient ID: Sherry Wong, female    DOB: 20-Jul-1956, 68 y.o.   MRN: [id number]  HPI: Sherry Wong is a 68 y.o. female who returns for follow up appointment for chronic pain and medication refill. She states her  pain is located in her lower back radiating into her bilateral hips and bilateral lower extremities.She  rates her pain 6. Her current exercise regime is walking and performing stretching exercises.  Sherry Wong Morphine  equivalent is 120.00 MME. She  is also prescribed Alprazolam   .We have discussed the black box warning of using opioids and benzodiazepines. I highlighted the dangers of using these drugs together and discussed the adverse events including respiratory suppression, overdose, cognitive impairment and importance of compliance with current regimen. We will continue to monitor and adjust as indicated.      Last UDS was Performed on 11/04/2023, it was consistent.    Pain Inventory Average Pain 8 Pain Right Now 6 My pain is sharp, burning, dull, stabbing, tingling, and aching  In the last 24 hours, has pain interfered with the following? General activity 9 Relation with others 9 Enjoyment of life 10 What TIME of day is your pain at its worst? morning , daytime, evening, and night Sleep (in general) Fair  Pain is worse with: walking, bending, and some activites Pain improves with: rest, heat/ice, pacing activities, and medication Relief from Meds: 7  Family History  Problem Relation Age of Onset   Anesthesia problems Mother    COPD Mother    Diabetes Mother    Anesthesia problems Sister    Hypotension Neg Hx    Malignant hyperthermia Neg Hx    Pseudochol deficiency Neg Hx    Colon cancer Neg Hx    Esophageal cancer Neg Hx    Liver disease Neg Hx    Pancreatic cancer Neg Hx    Social History   Socioeconomic History   Marital status: Widowed    Spouse name: Not on file   Number of children: 0   Years of education: 12   Highest  education level: Not on file  Occupational History   Occupation: Disabled  Tobacco Use   Smoking status: Never   Smokeless tobacco: Never  Vaping Use   Vaping status: Never Used  Substance and Sexual Activity   Alcohol  use: Yes    Alcohol /week: 0.0 standard drinks of alcohol     Comment: occ- 0.5 can beer every 2 months   Drug use: No   Sexual activity: Not Currently  Other Topics Concern   Not on file  Social History Narrative   Lives at home with husband.   Caffeine use: 1 cup coffee 3 times per week.    Social Drivers of Corporate investment banker Strain: Not on file  Food Insecurity: Not on file  Transportation Needs: Not on file  Physical Activity: Not on file  Stress: Not on file  Social Connections: Not on file   Past Surgical History:  Procedure Laterality Date   BREAST BIOPSY     right   BREAST LUMPECTOMY Left 2014   CHOLECYSTECTOMY     gangrene in small intestines clipped by surgeon per patient   COLONOSCOPY  2009   Dr Alejos Husband    cysts removed from ovary     DILATION AND CURETTAGE OF UTERUS     ELBOW SURGERY Left    ESOPHAGOGASTRODUODENOSCOPY     HEMATOMA EVACUATION N/A 08/25/2013   Procedure: EVACUATION OF LUMBAR  HEMATOMA;  Surgeon: Adelbert Adler, MD;  Location: [facility];  Service: Neurosurgery;  Laterality: N/A;   LUMBAR LAMINECTOMY/DECOMPRESSION MICRODISCECTOMY  12/03/2011   Procedure: LUMBAR LAMINECTOMY/DECOMPRESSION MICRODISCECTOMY 2 LEVELS;  Surgeon: Adelbert Adler, MD;  Location: [facility];  Service: Neurosurgery;  Laterality: Right;  Right Lumbar four-five Lumbar five sacral one Foraminotomies   LUMBAR WOUND DEBRIDEMENT N/A 09/01/2013   Procedure: LUMBAR WOUND DEBRIDEMENT;  Surgeon: Adelbert Adler, MD;  Location: [facility];  Service: Neurosurgery;  Laterality: N/A;   PLACEMENT OF LUMBAR DRAIN N/A 09/01/2013   Procedure: PLACEMENT OF LUMBAR DRAIN;  Surgeon: Adelbert Adler, MD;  [facility]: [facility];  Service: Neurosurgery;   Laterality: N/A;   TRANSFORAMINAL LUMBAR INTERBODY FUSION (TLIF) WITH PEDICLE SCREW FIXATION 2 LEVEL N/A 08/25/2013   Procedure:  Lumbar four-five, Lumbar five-Sacral one Transforaminal Lumbar Interbody Fusion ;  Surgeon: Adelbert Adler, MD;  Location: [facility];  Service: Neurosurgery;  Laterality: N/A;   Past Surgical History:  Procedure Laterality Date   BREAST BIOPSY     right   BREAST LUMPECTOMY Left 2014   CHOLECYSTECTOMY     gangrene in small intestines clipped by surgeon per patient   COLONOSCOPY  2009   Dr Alejos Husband    cysts removed from ovary     DILATION AND CURETTAGE OF UTERUS     ELBOW SURGERY Left    ESOPHAGOGASTRODUODENOSCOPY     HEMATOMA EVACUATION N/A 08/25/2013   Procedure: EVACUATION OF LUMBAR HEMATOMA;  Surgeon: Adelbert Adler, MD;  [facility]: [facility];  Service: Neurosurgery;  Laterality: N/A;   LUMBAR LAMINECTOMY/DECOMPRESSION MICRODISCECTOMY  12/03/2011   Procedure: LUMBAR LAMINECTOMY/DECOMPRESSION MICRODISCECTOMY 2 LEVELS;  Surgeon: Adelbert Adler, MD;  [facility]: [facility];  Service: Neurosurgery;  Laterality: Right;  Right Lumbar four-five Lumbar five sacral one Foraminotomies   LUMBAR WOUND DEBRIDEMENT N/A 09/01/2013   Procedure: LUMBAR WOUND DEBRIDEMENT;  Surgeon: Adelbert Adler, MD;  [facility]: [facility];  Service: Neurosurgery;  Laterality: N/A;   PLACEMENT OF LUMBAR DRAIN N/A 09/01/2013   Procedure: PLACEMENT OF LUMBAR DRAIN;  Surgeon: Adelbert Adler, MD;  [facility]: [facility];  Service: Neurosurgery;  Laterality: N/A;   TRANSFORAMINAL LUMBAR INTERBODY FUSION (TLIF) WITH PEDICLE SCREW FIXATION 2 LEVEL N/A 08/25/2013   Procedure:  Lumbar four-five, Lumbar five-Sacral one Transforaminal Lumbar Interbody Fusion ;  Surgeon: Adelbert Adler, MD;  Location: [facility];  Service: Neurosurgery;  Laterality: N/A;   Past Medical History:  Diagnosis Date   Anxiety    takes celexa  daily   Arthritis    Asthma    Bronchitis    Chronic back pain     ddd/lumbago,and radiculopathy   COVID-17 Sep 2020   Depression    Gastric ulcer    GERD (gastroesophageal reflux disease)    takes Protonix  daily   Gout    hx of   H/O hiatal hernia    Hx of colonic polyps    Hypertension    takes Losartan  daily   Hypothyroidism    takes Levothyroxine  daily   IBS (irritable bowel syndrome)    Nocturia    Pneumonia    hx of 15+yrs ago   PONV (postoperative nausea and vomiting)    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 Dorothy Gates   Ulcerative colitis    Urinary incontinence    BP (!) 146/87 (BP Location: Left Arm, Patient Position: Sitting, Cuff Size: Normal)  Pulse 83   Ht 5' 6 (1.676 m)   Wt 227 lb (103 kg)   SpO2 99%   BMI 36.64 kg/m   Opioid Risk Score:   Fall Risk Score:  `1  Depression screen PHQ 2/9     03/01/2024    2:18 PM 07/10/2023   11:09 AM 05/12/2023    1:39 PM 01/09/2023    1:47 PM 11/12/2022    1:19 PM 05/13/2022   10:56 AM 03/12/2022   11:29 AM  Depression screen PHQ 2/9  Decreased Interest 3 0 3 3 3  0 2  Down, Depressed, Hopeless 3 0 3 3 3  0 2  PHQ - 2 Score 6 0 6 6 6  0 4  Altered sleeping 3        Tired, decreased energy 3        Change in appetite 1        Feeling bad or failure about yourself  1        Trouble concentrating 1        Moving slowly or fidgety/restless 0        Suicidal thoughts 0        PHQ-9 Score 15            Review of Systems  Musculoskeletal:  Positive for arthralgias, back pain and myalgias.       Lower back pain, bilateral leg and foot pain  All other systems reviewed and are negative.      Objective:   Physical Exam Vitals and nursing note reviewed.  Constitutional:      Appearance: Normal appearance.  Cardiovascular:     Rate and Rhythm: Normal rate and regular rhythm.     Pulses: Normal pulses.     Heart sounds: Normal heart sounds.  Pulmonary:     Effort: Pulmonary effort is normal.     Breath sounds: Normal breath sounds.  Musculoskeletal:      Comments: Normal Muscle Bulk and Muscle Testing Reveals:  Upper Extremities: Full ROM and Muscle Strength 5/5 Lumbar Paraspinal Tenderness: L-4-L-5 Bilateral Greater Trochanter Tenderness Lower Extremities: Full ROM and Muscle Strength 5/5 Arises from Chair slowly using cane or support Antalgic  Gait     Skin:    General: Skin is warm and dry.  Neurological:     Mental Status: She is alert and oriented to person, place, and time.  Psychiatric:        Mood and Affect: Mood normal.        Behavior: Behavior normal.         Assessment & Plan:  1. Cauda equina syndrome: Incontinence: Wearing Depends:Continue to Monitor. 03/01/2024. 2. Insomnia: Continue with Nortriptyline . 03/01/2024 3. 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 Continue current medication regimen. 03/01/2024 Refilled:  Percocet 10 mg #120--use 1 tablet every 6 hours as needed and  MS Contin  30 mg one tablet every 12 hours as needed #60. Second scripts sent for the following month We will continue the opioid monitoring program, this consists of regular clinic visits, examinations, urine drug screen, pill counts as well as use of [place]  Controlled Substance Reporting system. A 12 month History has been reviewed on the [place]  Controlled Substance Reporting System on 03/01/2024. 4. Depression: Continue with Cymbalta. Continue to Monitor. 03/01/2024 5. Anxiety: Continue with current medication regimen with Xanax . Educated on the [employer]. Continue to Monitor. 03/01/2024 6.Neuropathy:. Continue:with current medication nregimen with  Pamelor .  03/01/2024 7. Constipation: Continue Senokot. Continue to Monitor. 03/01/2024 8.Muscle Spasm: Continue  Methocarbamol . .Continue to Monitor. 03/01/2024 9. Lumbar Radiculitis: Continue Pamelor . Continue to Monitor. 03/01/2024 10. Bilateral Greater Trochanter Bursitis:  Continue to Alternate Ice and Heat Therapy .  Continue HEP as Tolerated. Continue to Monitor. 03/01/2024     F/U in 2 months

## 2024-03-01 NOTE — Patient Instructions (Signed)
 Keep Blood Pressure log and send readings to your primary care physician

## 2024-03-09 DIAGNOSIS — R0602 Shortness of breath: Secondary | ICD-10-CM | POA: Diagnosis not present

## 2024-03-31 ENCOUNTER — Telehealth: Payer: Self-pay | Admitting: Registered Nurse

## 2024-04-05 DIAGNOSIS — E039 Hypothyroidism, unspecified: Secondary | ICD-10-CM | POA: Diagnosis not present

## 2024-04-13 ENCOUNTER — Telehealth: Payer: Self-pay | Admitting: Registered Nurse

## 2024-04-13 DIAGNOSIS — G894 Chronic pain syndrome: Secondary | ICD-10-CM

## 2024-04-13 DIAGNOSIS — G834 Cauda equina syndrome: Secondary | ICD-10-CM

## 2024-04-13 MED ORDER — MORPHINE SULFATE ER 30 MG PO TBCR: EXTENDED_RELEASE_TABLET | ORAL | 0 refills | Status: DC

## 2024-04-13 MED ORDER — OXYCODONE-ACETAMINOPHEN 10-325 MG PO TABS: 1.0000 | ORAL_TABLET | Freq: Four times a day (QID) | ORAL | 0 refills | Status: DC | PRN

## 2024-04-13 NOTE — Telephone Encounter (Signed)
 PMP was Reviewed.  MS Contin  and Oxycodone  e-scribe for August,  Sherry Wong has a scheduled appointment in August. She is aware of the above via My-Chart message.

## 2024-05-13 ENCOUNTER — Encounter: Payer: Self-pay | Admitting: Registered Nurse

## 2024-05-13 ENCOUNTER — Encounter: Attending: Registered Nurse | Admitting: Registered Nurse

## 2024-05-13 VITALS — BP 138/78 | HR 72 | Ht 66.0 in | Wt 226.2 lb

## 2024-05-13 DIAGNOSIS — G834 Cauda equina syndrome: Secondary | ICD-10-CM | POA: Insufficient documentation

## 2024-05-13 DIAGNOSIS — Z79891 Long term (current) use of opiate analgesic: Secondary | ICD-10-CM | POA: Insufficient documentation

## 2024-05-13 DIAGNOSIS — G894 Chronic pain syndrome: Secondary | ICD-10-CM | POA: Diagnosis not present

## 2024-05-13 DIAGNOSIS — Z5181 Encounter for therapeutic drug level monitoring: Secondary | ICD-10-CM | POA: Insufficient documentation

## 2024-05-13 MED ORDER — MORPHINE SULFATE ER 30 MG PO TBCR: EXTENDED_RELEASE_TABLET | ORAL | 0 refills | Status: DC

## 2024-05-13 MED ORDER — OXYCODONE-ACETAMINOPHEN 10-325 MG PO TABS: 1.0000 | ORAL_TABLET | Freq: Four times a day (QID) | ORAL | 0 refills | Status: DC | PRN

## 2024-05-13 NOTE — Progress Notes (Signed)
 Subjective:    Patient ID: Sherry Wong, female    DOB: 1955-12-07, 68 y.o.   MRN: [id number]  HPI: Sherry Wong is a 68 y.o. female who returns for follow up appointment for chronic pain and medication refill. She states her pain is located in her lower back radiating into her bilateral hips and bilateral lower extremities. She rates her pain 5. Her current exercise regime is walking and performing stretching exercises.  [name] Morphine equivalent is 120.00 MME.   UDS ordered today.    Pain Inventory Average Pain 7 Pain Right Now 5 My pain is sharp, burning, stabbing, tingling, and aching  In the last 24 hours, has pain interfered with the following? General activity 9 Relation with others 9 Enjoyment of life 9 What TIME of day is your pain at its worst? morning , daytime, evening, and night Sleep (in general) Fair  Pain is worse with: walking, bending, sitting, standing, and some activites Pain improves with: rest, therapy/exercise, and medication Relief from Meds: 5  Family History  Problem Relation Age of Onset   Anesthesia problems Mother    COPD Mother    Diabetes Mother    Anesthesia problems Sister    Hypotension Neg Hx    Malignant hyperthermia Neg Hx    Pseudochol deficiency Neg Hx    Colon cancer Neg Hx    Esophageal cancer Neg Hx    Liver disease Neg Hx    Pancreatic cancer Neg Hx    Social History   Socioeconomic History   Marital status: Widowed    Spouse name: Not on file   Number of children: 0   Years of education: 58   Highest education level: Not on file  Occupational History   Occupation: Disabled  Tobacco Use   Smoking status: Never   Smokeless tobacco: Never  Vaping Use   Vaping status: Never Used  Substance and Sexual Activity   Alcohol use: Yes    Alcohol/week: 0.0 standard drinks of alcohol    Comment: occ- 0.5 can beer every 2 months   Drug use: No   Sexual activity: Not Currently  Other Topics Concern   Not on file   Social History Narrative   Lives at home with husband.   Caffeine use: 1 cup coffee 3 times per week.    Social Drivers of Corporate investment banker Strain: Not on file  Food Insecurity: Not on file  Transportation Needs: Not on file  Physical Activity: Not on file  Stress: Not on file  Social Connections: Not on file   Past Surgical History:  Procedure Laterality Date   BREAST BIOPSY     right   BREAST LUMPECTOMY Left 2014   CHOLECYSTECTOMY     gangrene in small intestines clipped by surgeon per patient   COLONOSCOPY  2009   Dr Jeryl    cysts removed from ovary     DILATION AND CURETTAGE OF UTERUS     ELBOW SURGERY Left    ESOPHAGOGASTRODUODENOSCOPY     HEMATOMA EVACUATION N/A 08/25/2013   Procedure: EVACUATION OF LUMBAR HEMATOMA;  Surgeon: Catalina CHRISTELLA Stains, MD;  Location: [facility];  Service: Neurosurgery;  Laterality: N/A;   LUMBAR LAMINECTOMY/DECOMPRESSION MICRODISCECTOMY  12/03/2011   Procedure: LUMBAR LAMINECTOMY/DECOMPRESSION MICRODISCECTOMY 2 LEVELS;  Surgeon: Catalina CHRISTELLA Stains, MD;  Location: [facility];  Service: Neurosurgery;  Laterality: Right;  Right Lumbar four-five Lumbar five sacral one Foraminotomies   LUMBAR WOUND DEBRIDEMENT N/A 09/01/2013  Procedure: LUMBAR WOUND DEBRIDEMENT;  Surgeon: Catalina CHRISTELLA Stains, MD;  Location: [facility];  Service: Neurosurgery;  Laterality: N/A;   PLACEMENT OF LUMBAR DRAIN N/A 09/01/2013   Procedure: PLACEMENT OF LUMBAR DRAIN;  Surgeon: Catalina CHRISTELLA Stains, MD;  [facility]: [facility];  Service: Neurosurgery;  Laterality: N/A;   TRANSFORAMINAL LUMBAR INTERBODY FUSION (TLIF) WITH PEDICLE SCREW FIXATION 2 LEVEL N/A 08/25/2013   Procedure:  Lumbar four-five, Lumbar five-Sacral one Transforaminal Lumbar Interbody Fusion ;  Surgeon: Catalina CHRISTELLA Stains, MD;  [facility]: [facility];  Service: Neurosurgery;  Laterality: N/A;   Past Surgical History:  Procedure Laterality Date   BREAST BIOPSY     right   BREAST LUMPECTOMY Left 2014    CHOLECYSTECTOMY     gangrene in small intestines clipped by surgeon per patient   COLONOSCOPY  2009   Dr Jeryl    cysts removed from ovary     DILATION AND CURETTAGE OF UTERUS     ELBOW SURGERY Left    ESOPHAGOGASTRODUODENOSCOPY     HEMATOMA EVACUATION N/A 08/25/2013   Procedure: EVACUATION OF LUMBAR HEMATOMA;  Surgeon: Catalina CHRISTELLA Stains, MD;  [facility];  Service: Neurosurgery;  Laterality: N/A;   LUMBAR LAMINECTOMY/DECOMPRESSION MICRODISCECTOMY  12/03/2011   Procedure: LUMBAR LAMINECTOMY/DECOMPRESSION MICRODISCECTOMY 2 LEVELS;  Surgeon: Catalina CHRISTELLA Stains, MD;  [facility];  Service: Neurosurgery;  Laterality: Right;  Right Lumbar four-five Lumbar five sacral one Foraminotomies   LUMBAR WOUND DEBRIDEMENT N/A 09/01/2013   Procedure: LUMBAR WOUND DEBRIDEMENT;  Surgeon: Catalina CHRISTELLA Stains, MD;  [facility];  Service: Neurosurgery;  Laterality: N/A;   PLACEMENT OF LUMBAR DRAIN N/A 09/01/2013   Procedure: PLACEMENT OF LUMBAR DRAIN;  Surgeon: Catalina CHRISTELLA Stains, MD;  [facility];  Service: Neurosurgery;  Laterality: N/A;   TRANSFORAMINAL LUMBAR INTERBODY FUSION (TLIF) WITH PEDICLE SCREW FIXATION 2 LEVEL N/A 08/25/2013   Procedure:  Lumbar four-five, Lumbar five-Sacral one Transforaminal Lumbar Interbody Fusion ;  Surgeon: Catalina CHRISTELLA Stains, MD;  [facility]: [facility];  Service: Neurosurgery;  Laterality: N/A;   Past Medical History:  Diagnosis Date   Anxiety    takes celexa daily   Arthritis    Asthma    Bronchitis    Chronic back pain    ddd/lumbago,and radiculopathy   COVID-17 Sep 2020   Depression    Gastric ulcer    GERD (gastroesophageal reflux disease)    takes Protonix daily   Gout    hx of   H/O hiatal hernia    Hx of colonic polyps    Hypertension    takes Losartan daily   Hypothyroidism    takes Levothyroxine daily   IBS (irritable bowel syndrome)    Nocturia    Pneumonia    hx of 15+yrs ago   PONV (postoperative nausea and  vomiting)    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 Philippa   Ulcerative colitis    Urinary incontinence    BP (!) 167/78 (BP Location: Left Arm, Patient Position: Sitting, Cuff Size: Large)   Pulse 72   Ht 5' 6 (1.676 m)   Wt 226 lb 3.2 oz (102.6 kg)   SpO2 97%   BMI 36.51 kg/m   Opioid Risk Score:   Fall Risk Score:  `1  Depression screen [facility] 2/9     05/13/2024    1:43 PM 03/01/2024    2:18 PM 07/10/2023   11:09 AM 05/12/2023  1:39 PM 01/09/2023    1:47 PM 11/12/2022    1:19 PM 05/13/2022   10:56 AM  Depression screen PHQ 2/9  Decreased Interest 1 3 0 3 3 3  0  Down, Depressed, Hopeless 1 3 0 3 3 3  0  PHQ - 2 Score 2 6 0 6 6 6  0  Altered sleeping 2 3       Tired, decreased energy 2 3       Change in appetite 0 1       Feeling bad or failure about yourself  0 1       Trouble concentrating 0 1       Moving slowly or fidgety/restless 0 0       Suicidal thoughts 0 0       PHQ-9 Score 6 15       Difficult doing work/chores Very difficult            Review of Systems  Musculoskeletal:  Positive for back pain.       Low back pain, bilateral leg pain  All other systems reviewed and are negative.      Objective:   Physical Exam Vitals and nursing note reviewed.  Constitutional:      Appearance: Normal appearance.  Cardiovascular:     Rate and Rhythm: Normal rate and regular rhythm.     Pulses: Normal pulses.     Heart sounds: Normal heart sounds.  Pulmonary:     Effort: Pulmonary effort is normal.     Breath sounds: Normal breath sounds.  Musculoskeletal:     Comments: Normal Muscle Bulk and Muscle Testing Reveals:  Upper Extremities: Full ROM and Muscle Strength 5/5 Lumbar Paraspinal Tenderness: L-4-L-5 Bilateral Greater Trochanter Tenderness Lower Extremities: Full ROM and Muscle Strength 5/5 Arises from chair slowly using cane for support Antalgic  Gait     Skin:    General: Skin is warm and dry.  Neurological:     Mental Status: She  is alert and oriented to person, place, and time.  Psychiatric:        Mood and Affect: Mood normal.        Behavior: Behavior normal.          Assessment & Plan:  1. Cauda equina syndrome: Incontinence: Wearing Depends:Continue to Monitor. 05/13/2024. 2. Insomnia: Continue with Nortriptyline. 05/13/2024 3. 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 Continue current medication regimen. 05/13/2024 Refilled:  Percocet 10 mg #120--use 1 tablet every 6 hours as needed and  MS Contin 30 mg one tablet every 12 hours as needed #60. Second scripts sent for the following month We will continue the opioid monitoring program, this consists of regular clinic visits, examinations, urine drug screen, pill counts as well as use of [place]  Controlled Substance Reporting system. A 12 month History has been reviewed on the [place]  Controlled Substance Reporting System on 05/13/2024. 4. Depression: Continue with Cymbalta. Continue to Monitor. 05/13/2024 5. Anxiety: Continue with current medication regimen with Xanax. Educated on the [employer]. Continue to Monitor. 05/13/2024 6.Neuropathy:. Continue:with current medication nregimen with  Pamelor. 05/13/2024 7. Constipation: Continue Senokot. Continue to Monitor. 05/13/2024 8.Muscle Spasm: Continue  Methocarbamol. .Continue to Monitor. 05/13/2024 9. Lumbar Radiculitis: Continue Pamelor. Continue to Monitor. 05/13/2024 10. Bilateral Greater Trochanter Bursitis:  Continue to Alternate Ice and Heat Therapy . Continue HEP as Tolerated. Continue to Monitor. 05/13/2024     F/U in 2 months

## 2024-05-18 LAB — TOXASSURE SELECT,+ANTIDEPR,UR

## 2024-05-19 DIAGNOSIS — E785 Hyperlipidemia, unspecified: Secondary | ICD-10-CM | POA: Diagnosis not present

## 2024-05-19 DIAGNOSIS — E039 Hypothyroidism, unspecified: Secondary | ICD-10-CM | POA: Diagnosis not present

## 2024-05-19 DIAGNOSIS — R5383 Other fatigue: Secondary | ICD-10-CM | POA: Diagnosis not present

## 2024-05-19 DIAGNOSIS — I10 Essential (primary) hypertension: Secondary | ICD-10-CM | POA: Diagnosis not present

## 2024-05-19 DIAGNOSIS — M545 Low back pain, unspecified: Secondary | ICD-10-CM | POA: Diagnosis not present

## 2024-05-19 DIAGNOSIS — E559 Vitamin D deficiency, unspecified: Secondary | ICD-10-CM | POA: Diagnosis not present

## 2024-05-19 DIAGNOSIS — D519 Vitamin B12 deficiency anemia, unspecified: Secondary | ICD-10-CM | POA: Diagnosis not present

## 2024-06-02 ENCOUNTER — Other Ambulatory Visit: Payer: Self-pay | Admitting: Registered Nurse

## 2024-06-14 DIAGNOSIS — E039 Hypothyroidism, unspecified: Secondary | ICD-10-CM | POA: Diagnosis not present

## 2024-07-01 ENCOUNTER — Ambulatory Visit: Admitting: Registered Nurse

## 2024-07-27 ENCOUNTER — Encounter: Payer: Self-pay | Admitting: Registered Nurse

## 2024-07-27 ENCOUNTER — Encounter: Attending: Registered Nurse | Admitting: Registered Nurse

## 2024-07-27 VITALS — BP 152/87 | HR 88 | Ht 66.0 in | Wt 218.6 lb

## 2024-07-27 DIAGNOSIS — G894 Chronic pain syndrome: Secondary | ICD-10-CM | POA: Diagnosis present

## 2024-07-27 DIAGNOSIS — F411 Generalized anxiety disorder: Secondary | ICD-10-CM | POA: Insufficient documentation

## 2024-07-27 DIAGNOSIS — Z5181 Encounter for therapeutic drug level monitoring: Secondary | ICD-10-CM | POA: Diagnosis present

## 2024-07-27 DIAGNOSIS — M5416 Radiculopathy, lumbar region: Secondary | ICD-10-CM | POA: Insufficient documentation

## 2024-07-27 DIAGNOSIS — Z79891 Long term (current) use of opiate analgesic: Secondary | ICD-10-CM | POA: Diagnosis present

## 2024-07-27 DIAGNOSIS — G834 Cauda equina syndrome: Secondary | ICD-10-CM | POA: Insufficient documentation

## 2024-07-27 DIAGNOSIS — M7062 Trochanteric bursitis, left hip: Secondary | ICD-10-CM | POA: Diagnosis present

## 2024-07-27 DIAGNOSIS — M7061 Trochanteric bursitis, right hip: Secondary | ICD-10-CM | POA: Diagnosis present

## 2024-07-27 MED ORDER — MORPHINE SULFATE ER 30 MG PO TBCR: EXTENDED_RELEASE_TABLET | ORAL | 0 refills | Status: DC

## 2024-07-27 MED ORDER — ALPRAZOLAM 0.25 MG PO TABS: ORAL_TABLET | ORAL | 2 refills | Status: DC

## 2024-07-27 MED ORDER — OXYCODONE-ACETAMINOPHEN 10-325 MG PO TABS: 1.0000 | ORAL_TABLET | Freq: Four times a day (QID) | ORAL | 0 refills | Status: DC | PRN

## 2024-07-27 NOTE — Progress Notes (Unsigned)
 Subjective:    Patient ID: Sherry Wong, female    DOB: 12-Dec-1955, 68 y.o.   MRN: [id number]  HPI: Sherry Wong is a 68 y.o. female who returns for follow up appointment for chronic pain and medication refill. She states her pain is located in her lower back radiating into her bilateral lower extremities. She rates her pain 6. Her current exercise regime is walking and performing stretching exercises.  Ms. Buske reports she has been experiencing anxiety with her sister recent health issues and her best friend being hospitalized. She denies any suicidal ideation, we will resume her alprazolam  and re-evaluate at next visit, she verbalizes understanding.   Ms. Dutan Morphine  equivalent is 120.00 MME.  She will be prescribed Alprazolam  today .We have discussed the black box warning of using opioids and benzodiazepines. I highlighted the dangers of using these drugs together and discussed the adverse events including respiratory suppression, overdose, cognitive impairment and importance of compliance with current regimen. We will continue to monitor and adjust as indicated.    Last UDS was Performed on 05/13/2024, it was consistent.      Pain Inventory Average Pain 6 Pain Right Now 6 My pain is constant, sharp, burning, dull, stabbing, tingling, and aching  In the last 24 hours, has pain interfered with the following? General activity 9 Relation with others 9 Enjoyment of life 9 What TIME of day is your pain at its worst? morning , daytime, evening, and night Sleep (in general) Fair  Pain is worse with: walking, bending, sitting, standing, and some activites Pain improves with: rest, heat/ice, pacing activities, and medication Relief from Meds: 5  Family History  Problem Relation Age of Onset   Anesthesia problems Mother    COPD Mother    Diabetes Mother    Anesthesia problems Sister    Hypotension Neg Hx    Malignant hyperthermia Neg Hx    Pseudochol deficiency Neg Hx     Colon cancer Neg Hx    Esophageal cancer Neg Hx    Liver disease Neg Hx    Pancreatic cancer Neg Hx    Social History   Socioeconomic History   Marital status: Widowed    Spouse name: Not on file   Number of children: 0   Years of education: 12   Highest education level: Not on file  Occupational History   Occupation: Disabled  Tobacco Use   Smoking status: Never   Smokeless tobacco: Never  Vaping Use   Vaping status: Never Used  Substance and Sexual Activity   Alcohol  use: Yes    Alcohol /week: 0.0 standard drinks of alcohol     Comment: occ- 0.5 can beer every 2 months   Drug use: No   Sexual activity: Not Currently  Other Topics Concern   Not on file  Social History Narrative   Lives at home with husband.   Caffeine use: 1 cup coffee 3 times per week.    Social Drivers of Corporate Investment Banker Strain: Not on file  Food Insecurity: Not on file  Transportation Needs: Not on file  Physical Activity: Not on file  Stress: Not on file  Social Connections: Not on file   Past Surgical History:  Procedure Laterality Date   BREAST BIOPSY     right   BREAST LUMPECTOMY Left 2014   CHOLECYSTECTOMY     gangrene in small intestines clipped by surgeon per patient   COLONOSCOPY  2009   Dr Jeryl  cysts removed from ovary     DILATION AND CURETTAGE OF UTERUS     ELBOW SURGERY Left    ESOPHAGOGASTRODUODENOSCOPY     HEMATOMA EVACUATION N/A 08/25/2013   Procedure: EVACUATION OF LUMBAR HEMATOMA;  Surgeon: Catalina CHRISTELLA Stains, MD;  [facility]: [facility];  Service: Neurosurgery;  Laterality: N/A;   LUMBAR LAMINECTOMY/DECOMPRESSION MICRODISCECTOMY  12/03/2011   Procedure: LUMBAR LAMINECTOMY/DECOMPRESSION MICRODISCECTOMY 2 LEVELS;  Surgeon: Catalina CHRISTELLA Stains, MD;  [facility]: [facility];  Service: Neurosurgery;  Laterality: Right;  Right Lumbar four-five Lumbar five sacral one Foraminotomies   LUMBAR WOUND DEBRIDEMENT N/A 09/01/2013   Procedure: LUMBAR WOUND DEBRIDEMENT;   Surgeon: Catalina CHRISTELLA Stains, MD;  [facility];  Service: Neurosurgery;  Laterality: N/A;   PLACEMENT OF LUMBAR DRAIN N/A 09/01/2013   Procedure: PLACEMENT OF LUMBAR DRAIN;  Surgeon: Catalina CHRISTELLA Stains, MD;  [facility];  Service: Neurosurgery;  Laterality: N/A;   TRANSFORAMINAL LUMBAR INTERBODY FUSION (TLIF) WITH PEDICLE SCREW FIXATION 2 LEVEL N/A 08/25/2013   Procedure:  Lumbar four-five, Lumbar five-Sacral one Transforaminal Lumbar Interbody Fusion ;  Surgeon: Catalina CHRISTELLA Stains, MD;  [facility]: [facility];  Service: Neurosurgery;  Laterality: N/A;   Past Surgical History:  Procedure Laterality Date   BREAST BIOPSY     right   BREAST LUMPECTOMY Left 2014   CHOLECYSTECTOMY     gangrene in small intestines clipped by surgeon per patient   COLONOSCOPY  2009   Dr Jeryl    cysts removed from ovary     DILATION AND CURETTAGE OF UTERUS     ELBOW SURGERY Left    ESOPHAGOGASTRODUODENOSCOPY     HEMATOMA EVACUATION N/A 08/25/2013   Procedure: EVACUATION OF LUMBAR HEMATOMA;  Surgeon: Catalina CHRISTELLA Stains, MD;  [facility];  Service: Neurosurgery;  Laterality: N/A;   LUMBAR LAMINECTOMY/DECOMPRESSION MICRODISCECTOMY  12/03/2011   Procedure: LUMBAR LAMINECTOMY/DECOMPRESSION MICRODISCECTOMY 2 LEVELS;  Surgeon: Catalina CHRISTELLA Stains, MD;  [facility];  Service: Neurosurgery;  Laterality: Right;  Right Lumbar four-five Lumbar five sacral one Foraminotomies   LUMBAR WOUND DEBRIDEMENT N/A 09/01/2013   Procedure: LUMBAR WOUND DEBRIDEMENT;  Surgeon: Catalina CHRISTELLA Stains, MD;  [facility];  Service: Neurosurgery;  Laterality: N/A;   PLACEMENT OF LUMBAR DRAIN N/A 09/01/2013   Procedure: PLACEMENT OF LUMBAR DRAIN;  Surgeon: Catalina CHRISTELLA Stains, MD;  [facility];  Service: Neurosurgery;  Laterality: N/A;   TRANSFORAMINAL LUMBAR INTERBODY FUSION (TLIF) WITH PEDICLE SCREW FIXATION 2 LEVEL N/A 08/25/2013   Procedure:  Lumbar four-five, Lumbar five-Sacral one Transforaminal  Lumbar Interbody Fusion ;  Surgeon: Catalina CHRISTELLA Stains, MD;  [facility]: [facility];  Service: Neurosurgery;  Laterality: N/A;   Past Medical History:  Diagnosis Date   Anxiety    takes celexa  daily   Arthritis    Asthma    Bronchitis    Chronic back pain    ddd/lumbago,and radiculopathy   COVID-17 Sep 2020   Depression    Gastric ulcer    GERD (gastroesophageal reflux disease)    takes Protonix  daily   Gout    hx of   H/O hiatal hernia    Hx of colonic polyps    Hypertension    takes Losartan  daily   Hypothyroidism    takes Levothyroxine  daily   IBS (irritable bowel syndrome)    Nocturia    Pneumonia    hx of 15+yrs ago   PONV (postoperative nausea and vomiting)  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 Philippa   Ulcerative colitis    Urinary incontinence    BP (!) 184/96   Pulse 88   Ht 5' 6 (1.676 m)   Wt 218 lb 9.6 oz (99.2 kg)   SpO2 99%   BMI 35.28 kg/m   Opioid Risk Score:   Fall Risk Score:  `1  Depression screen PHQ 2/9     07/27/2024    1:55 PM 05/13/2024    1:43 PM 03/01/2024    2:18 PM 07/10/2023   11:09 AM 05/12/2023    1:39 PM 01/09/2023    1:47 PM 11/12/2022    1:19 PM  Depression screen PHQ 2/9  Decreased Interest  1 3 0 3 3 3   Down, Depressed, Hopeless  1 3 0 3 3 3   PHQ - 2 Score  2 6 0 6 6 6   Altered sleeping 3 2 3       Tired, decreased energy 3 2 3       Change in appetite  0 1      Feeling bad or failure about yourself   0 1      Trouble concentrating  0 1      Moving slowly or fidgety/restless  0 0      Suicidal thoughts  0 0      PHQ-9 Score  6 15      Difficult doing work/chores  Very difficult         Review of Systems  Musculoskeletal:  Positive for back pain.  All other systems reviewed and are negative.      Objective:   Physical Exam Vitals and nursing note reviewed.  Constitutional:      Appearance: Normal appearance.  Cardiovascular:     Rate and Rhythm: Normal rate and regular rhythm.      Pulses: Normal pulses.     Heart sounds: Normal heart sounds.  Pulmonary:     Effort: Pulmonary effort is normal.     Breath sounds: Normal breath sounds.  Musculoskeletal:     Comments: Normal Muscle Bulk and Muscle Testing Reveals:  Upper Extremities: ROM and Muscle Strength Thoracic, Lumbar Lower Extremities Gait     Skin:    General: Skin is warm and dry.  Neurological:     Mental Status: She is alert and oriented to person, place, and time.  Psychiatric:        Mood and Affect: Mood normal.        Behavior: Behavior normal.          Assessment & Plan:  1. Cauda equina syndrome: Incontinence: Wearing Depends:Continue to Monitor. 07/27/2024. 2. Insomnia: Continue with Nortriptyline . 07/27/2024 3. 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 Continue current medication regimen. 07/27/2024 Refilled:  Percocet 10 mg #120--use 1 tablet every 6 hours as needed and  MS Contin  30 mg one tablet every 12 hours as needed #60. Second scripts sent for the following month We will continue the opioid monitoring program, this consists of regular clinic visits, examinations, urine drug screen, pill counts as well as use of [place]  Controlled Substance Reporting system. A 12 month History has been reviewed on the [place]  Controlled Substance Reporting System on 07/27/2024. 4. Depression: Continue with Cymbalta. Continue to Monitor. 07/27/2024 5. Anxiety: Continue with current medication regimen with Xanax . Educated on the [employer]. Continue to Monitor. 07/27/2024 6.Neuropathy:. Continue:with current medication nregimen with  Pamelor .  07/27/2024 7. Constipation: Continue Senokot. Continue to Monitor. 07/27/2024 8.Muscle Spasm: Continue  Methocarbamol . .Continue to Monitor. 07/27/2024 9. Lumbar Radiculitis: Continue Pamelor . Continue to Monitor. 07/27/2024 10. Bilateral Greater Trochanter Bursitis:  Continue to Alternate Ice and  Heat Therapy . Continue HEP as Tolerated. Continue to Monitor. 07/27/2024     F/U in 2 months

## 2024-09-27 ENCOUNTER — Encounter: Attending: Registered Nurse | Admitting: Registered Nurse

## 2024-09-27 ENCOUNTER — Encounter: Payer: Self-pay | Admitting: Registered Nurse

## 2024-09-27 VITALS — BP 146/83 | HR 86 | Ht 66.0 in | Wt 220.4 lb

## 2024-09-27 DIAGNOSIS — Z79891 Long term (current) use of opiate analgesic: Secondary | ICD-10-CM | POA: Diagnosis present

## 2024-09-27 DIAGNOSIS — G834 Cauda equina syndrome: Secondary | ICD-10-CM | POA: Insufficient documentation

## 2024-09-27 DIAGNOSIS — M7061 Trochanteric bursitis, right hip: Secondary | ICD-10-CM | POA: Diagnosis present

## 2024-09-27 DIAGNOSIS — M7062 Trochanteric bursitis, left hip: Secondary | ICD-10-CM | POA: Diagnosis present

## 2024-09-27 DIAGNOSIS — F411 Generalized anxiety disorder: Secondary | ICD-10-CM | POA: Diagnosis present

## 2024-09-27 DIAGNOSIS — M5416 Radiculopathy, lumbar region: Secondary | ICD-10-CM | POA: Insufficient documentation

## 2024-09-27 DIAGNOSIS — G894 Chronic pain syndrome: Secondary | ICD-10-CM | POA: Insufficient documentation

## 2024-09-27 DIAGNOSIS — Z5181 Encounter for therapeutic drug level monitoring: Secondary | ICD-10-CM | POA: Insufficient documentation

## 2024-09-27 MED ORDER — ALPRAZOLAM 0.25 MG PO TABS: ORAL_TABLET | ORAL | 2 refills | Status: AC

## 2024-09-27 MED ORDER — MORPHINE SULFATE ER 30 MG PO TBCR: EXTENDED_RELEASE_TABLET | ORAL | 0 refills | Status: DC

## 2024-09-27 MED ORDER — OXYCODONE-ACETAMINOPHEN 10-325 MG PO TABS: 1.0000 | ORAL_TABLET | Freq: Four times a day (QID) | ORAL | 0 refills | Status: DC | PRN

## 2024-09-27 MED ORDER — MORPHINE SULFATE ER 30 MG PO TBCR: EXTENDED_RELEASE_TABLET | ORAL | 0 refills | Status: AC

## 2024-09-27 MED ORDER — OXYCODONE-ACETAMINOPHEN 10-325 MG PO TABS: 1.0000 | ORAL_TABLET | Freq: Four times a day (QID) | ORAL | 0 refills | Status: AC | PRN

## 2024-09-27 NOTE — Progress Notes (Signed)
 "  Subjective:    Patient ID: Sherry Wong, female    DOB: 1956-08-25, 68 y.o.   MRN: [id number]  HPI: Sherry Wong is a 68 y.o. female who returns for follow up appointment for chronic pain and medication refill. She states her pain is located in her lower back radiating into her bilateral hips and bilateral lower extremities. She rates her pain 6. Her current exercise regime is walking and performing stretching exercises.  Sherry Wong Morphine  equivalent is 120.00 MME. She is also prescribed Alprazolam  .We have discussed the black box warning of using opioids and benzodiazepines. I highlighted the dangers of using these drugs together and discussed the adverse events including respiratory suppression, overdose, cognitive impairment and importance of compliance with current regimen. We will continue to monitor and adjust as indicated.   Oral Swab was Performed today.      Pain Inventory Average Pain 7 Pain Right Now 6 My pain is sharp, burning, dull, stabbing, tingling, and aching  In the last 24 hours, has pain interfered with the following? General activity 9 Relation with others 9 Enjoyment of life 9 What TIME of day is your pain at its worst? morning , daytime, evening, and night Sleep (in general) Fair  Pain is worse with: walking, bending, sitting, standing, and some activites Pain improves with: rest, heat/ice, pacing activities, and medication Relief from Meds: 4  Family History  Problem Relation Age of Onset   Anesthesia problems Mother    COPD Mother    Diabetes Mother    Anesthesia problems Sister    Hypotension Neg Hx    Malignant hyperthermia Neg Hx    Pseudochol deficiency Neg Hx    Colon cancer Neg Hx    Esophageal cancer Neg Hx    Liver disease Neg Hx    Pancreatic cancer Neg Hx    Social History   Socioeconomic History   Marital status: Widowed    Spouse name: Not on file   Number of children: 0   Years of education: 12   Highest education  level: Not on file  Occupational History   Occupation: Disabled  Tobacco Use   Smoking status: Never   Smokeless tobacco: Never  Vaping Use   Vaping status: Never Used  Substance and Sexual Activity   Alcohol  use: Yes    Alcohol /week: 0.0 standard drinks of alcohol     Comment: occ- 0.5 can beer every 2 months   Drug use: No   Sexual activity: Not Currently  Other Topics Concern   Not on file  Social History Narrative   Lives at home with husband.   Caffeine use: 1 cup coffee 3 times per week.    Social Drivers of Health   Tobacco Use: Low Risk (09/27/2024)   Patient History    Smoking Tobacco Use: Never    Smokeless Tobacco Use: Never    Passive Exposure: Not on file  Financial Resource Strain: Not on file  Food Insecurity: Not on file  Transportation Needs: Not on file  Physical Activity: Not on file  Stress: Not on file  Social Connections: Not on file  Depression (EYV7-0): Medium Risk (07/27/2024)   Depression (PHQ2-9)    PHQ-2 Score: 6  Alcohol  Screen: Not on file  Housing: Not on file  Utilities: Not on file  Health Literacy: Not on file   Past Surgical History:  Procedure Laterality Date   BREAST BIOPSY     right   BREAST LUMPECTOMY Left 2014  CHOLECYSTECTOMY     gangrene in small intestines clipped by surgeon per patient   COLONOSCOPY  2009   Dr Jeryl    cysts removed from ovary     DILATION AND CURETTAGE OF UTERUS     ELBOW SURGERY Left    ESOPHAGOGASTRODUODENOSCOPY     HEMATOMA EVACUATION N/A 08/25/2013   Procedure: EVACUATION OF LUMBAR HEMATOMA;  Surgeon: Catalina CHRISTELLA Stains, MD;  Location: [facility];  Service: Neurosurgery;  Laterality: N/A;   LUMBAR LAMINECTOMY/DECOMPRESSION MICRODISCECTOMY  12/03/2011   Procedure: LUMBAR LAMINECTOMY/DECOMPRESSION MICRODISCECTOMY 2 LEVELS;  Surgeon: Catalina CHRISTELLA Stains, MD;  Location: [facility];  Service: Neurosurgery;  Laterality: Right;  Right Lumbar four-five Lumbar five sacral one Foraminotomies   LUMBAR  WOUND DEBRIDEMENT N/A 09/01/2013   Procedure: LUMBAR WOUND DEBRIDEMENT;  Surgeon: Catalina CHRISTELLA Stains, MD;  Location: [facility];  Service: Neurosurgery;  Laterality: N/A;   PLACEMENT OF LUMBAR DRAIN N/A 09/01/2013   Procedure: PLACEMENT OF LUMBAR DRAIN;  Surgeon: Catalina CHRISTELLA Stains, MD;  Location: [facility];  Service: Neurosurgery;  Laterality: N/A;   TRANSFORAMINAL LUMBAR INTERBODY FUSION (TLIF) WITH PEDICLE SCREW FIXATION 2 LEVEL N/A 08/25/2013   Procedure:  Lumbar four-five, Lumbar five-Sacral one Transforaminal Lumbar Interbody Fusion ;  Surgeon: Catalina CHRISTELLA Stains, MD;  Location: [facility];  Service: Neurosurgery;  Laterality: N/A;   Past Surgical History:  Procedure Laterality Date   BREAST BIOPSY     right   BREAST LUMPECTOMY Left 2014   CHOLECYSTECTOMY     gangrene in small intestines clipped by surgeon per patient   COLONOSCOPY  2009   Dr Jeryl    cysts removed from ovary     DILATION AND CURETTAGE OF UTERUS     ELBOW SURGERY Left    ESOPHAGOGASTRODUODENOSCOPY     HEMATOMA EVACUATION N/A 08/25/2013   Procedure: EVACUATION OF LUMBAR HEMATOMA;  Surgeon: Catalina CHRISTELLA Stains, MD;  Location: [facility];  Service: Neurosurgery;  Laterality: N/A;   LUMBAR LAMINECTOMY/DECOMPRESSION MICRODISCECTOMY  12/03/2011   Procedure: LUMBAR LAMINECTOMY/DECOMPRESSION MICRODISCECTOMY 2 LEVELS;  Surgeon: Catalina CHRISTELLA Stains, MD;  Location: [facility];  Service: Neurosurgery;  Laterality: Right;  Right Lumbar four-five Lumbar five sacral one Foraminotomies   LUMBAR WOUND DEBRIDEMENT N/A 09/01/2013   Procedure: LUMBAR WOUND DEBRIDEMENT;  Surgeon: Catalina CHRISTELLA Stains, MD;  Location: [facility];  Service: Neurosurgery;  Laterality: N/A;   PLACEMENT OF LUMBAR DRAIN N/A 09/01/2013   Procedure: PLACEMENT OF LUMBAR DRAIN;  Surgeon: Catalina CHRISTELLA Stains, MD;  Location: [facility];  Service: Neurosurgery;  Laterality: N/A;   TRANSFORAMINAL LUMBAR INTERBODY FUSION (TLIF) WITH PEDICLE SCREW FIXATION 2 LEVEL N/A  08/25/2013   Procedure:  Lumbar four-five, Lumbar five-Sacral one Transforaminal Lumbar Interbody Fusion ;  Surgeon: Catalina CHRISTELLA Stains, MD;  Location: [facility];  Service: Neurosurgery;  Laterality: N/A;   Past Medical History:  Diagnosis Date   Anxiety    takes celexa  daily   Arthritis    Asthma    Bronchitis    Chronic back pain    ddd/lumbago,and radiculopathy   COVID-17 Sep 2020   Depression    Gastric ulcer    GERD (gastroesophageal reflux disease)    takes Protonix  daily   Gout    hx of   H/O hiatal hernia    Hx of colonic polyps    Hypertension    takes Losartan  daily   Hypothyroidism    takes Levothyroxine  daily   IBS (  irritable bowel syndrome)    Nocturia    Pneumonia    hx of 15+yrs ago   PONV (postoperative nausea and vomiting)    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 Philippa   Ulcerative colitis    Urinary incontinence    BP (!) 146/83   Pulse 86   Ht 5' 6 (1.676 m)   Wt 220 lb 6.4 oz (100 kg)   SpO2 95%   BMI 35.57 kg/m   Opioid Risk Score:   Fall Risk Score:  `1  Depression screen [facility] 2/9     09/27/2024    1:43 PM 07/27/2024    1:55 PM 05/13/2024    1:43 PM 03/01/2024    2:18 PM 07/10/2023   11:09 AM 05/12/2023    1:39 PM 01/09/2023    1:47 PM  Depression screen PHQ 2/9  Decreased Interest 3  1 3  0 3 3  Down, Depressed, Hopeless 3  1 3  0 3 3  PHQ - 2 Score 6  2 6  0 6 6  Altered sleeping  3 2 3      Tired, decreased energy  3 2 3      Change in appetite   0 1     Feeling bad or failure about yourself    0 1     Trouble concentrating   0 1     Moving slowly or fidgety/restless   0 0     Suicidal thoughts   0 0     PHQ-9 Score   6  15      Difficult doing work/chores   Very difficult         Data saved with a previous flowsheet row definition    Review of Systems  Musculoskeletal:  Positive for back pain and gait problem.  All other systems reviewed and are negative.      Objective:   Physical Exam Vitals  and nursing note reviewed.  Constitutional:      Appearance: Normal appearance.  Cardiovascular:     Rate and Rhythm: Normal rate and regular rhythm.     Pulses: Normal pulses.     Heart sounds: Normal heart sounds.  Pulmonary:     Effort: Pulmonary effort is normal.     Breath sounds: Normal breath sounds.  Musculoskeletal:     Comments: Normal Muscle Bulk and Muscle Testing Reveals:  Upper Extremities: Full ROM and Muscle Strength 5/5 Bilateral AC Joint Tenderness  Lumbar Paraspinal Tenderness: L-4-L-5  Lower Extremities: Full ROM and Muscle Strength 5/5 Arises from Table slowly using cane for support Antalgic  Gait     Skin:    General: Skin is warm and dry.  Neurological:     Mental Status: She is alert and oriented to person, place, and time.  Psychiatric:        Mood and Affect: Mood normal.        Behavior: Behavior normal.          Assessment & Plan:  1. Cauda equina syndrome: Incontinence: Wearing Depends:Continue to Monitor. 09/27/2024. 2. Insomnia: Continue with Nortriptyline . 09/27/2024 3. 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 Continue current medication regimen. 09/27/2024 Refilled:  Percocet 10 mg #120--use 1 tablet every 6 hours as needed and  MS Contin  30 mg one tablet every 12 hours as needed #60. Second scripts sent for the following month We will continue the opioid monitoring program, this consists of regular clinic  visits, examinations, urine drug screen, pill counts as well as use of [place]  Controlled Substance Reporting system. A 12 month History has been reviewed on the [place]  Controlled Substance Reporting System on 09/27/2024. 4. Depression: Continue with Cymbalta. Continue to Monitor. 09/27/2025 5. Anxiety: Continue with current medication regimen with Xanax . Educated on the [employer]. Continue to Monitor. 09/27/2024 6.Neuropathy:. Continue:with current medication nregimen  with  Pamelor . 09/27/2024 7. Constipation: Continue Senokot. Continue to Monitor. 09/27/2024 8.Muscle Spasm: Continue  Methocarbamol . .Continue to Monitor. 09/27/2024 9. Lumbar Radiculitis: Continue Pamelor . Continue to Monitor. 09/27/2024 10. Bilateral Greater Trochanter Bursitis:  No complaints today. Continue to Alternate Ice and Heat Therapy . Continue HEP as Tolerated. Continue to Monitor. 09/27/2024     F/U in 2 months          "

## 2024-09-29 ENCOUNTER — Other Ambulatory Visit: Payer: Self-pay | Admitting: Registered Nurse

## 2024-09-30 LAB — DRUG TOX MONITOR 1 W/CONF, ORAL FLD
Amphetamines: NEGATIVE ng/mL
Barbiturates: NEGATIVE ng/mL
Benzodiazepines: NEGATIVE ng/mL
Buprenorphine: NEGATIVE ng/mL
Cocaine: NEGATIVE ng/mL
Codeine: NEGATIVE ng/mL
Dihydrocodeine: NEGATIVE ng/mL
Fentanyl: NEGATIVE ng/mL
Heroin Metabolite: NEGATIVE ng/mL
Hydrocodone: NEGATIVE ng/mL
Hydromorphone: NEGATIVE ng/mL
MARIJUANA: NEGATIVE ng/mL
MDMA: NEGATIVE ng/mL
Meprobamate: NEGATIVE ng/mL
Methadone: NEGATIVE ng/mL
Morphine: NEGATIVE ng/mL
Nicotine Metabolite: NEGATIVE ng/mL
Norhydrocodone: NEGATIVE ng/mL
Noroxycodone: 13.5 ng/mL — ABNORMAL HIGH
Opiates: POSITIVE ng/mL — AB
Oxycodone: 116.6 ng/mL — ABNORMAL HIGH
Oxymorphone: NEGATIVE ng/mL
Phencyclidine: NEGATIVE ng/mL
Tapentadol: NEGATIVE ng/mL
Tramadol: NEGATIVE ng/mL
Zolpidem: NEGATIVE ng/mL

## 2024-09-30 LAB — DRUG TOX ALC METAB W/CON, ORAL FLD: Alcohol Metabolite: NEGATIVE ng/mL

## 2024-11-22 ENCOUNTER — Encounter: Admitting: Registered Nurse
